# Patient Record
Sex: Female | Born: 1955 | Race: White | Hispanic: No | Marital: Single | Smoking: Never smoker
Health system: Southern US, Community
[De-identification: ages and names within clinical notes are randomized; demographics above are authoritative.]

## PROBLEM LIST (undated history)

## (undated) DIAGNOSIS — E785 Hyperlipidemia, unspecified: Secondary | ICD-10-CM

## (undated) DIAGNOSIS — F329 Major depressive disorder, single episode, unspecified: Secondary | ICD-10-CM

## (undated) DIAGNOSIS — I5189 Other ill-defined heart diseases: Secondary | ICD-10-CM

## (undated) DIAGNOSIS — E119 Type 2 diabetes mellitus without complications: Secondary | ICD-10-CM

## (undated) DIAGNOSIS — J189 Pneumonia, unspecified organism: Secondary | ICD-10-CM

## (undated) DIAGNOSIS — M1711 Unilateral primary osteoarthritis, right knee: Secondary | ICD-10-CM

## (undated) DIAGNOSIS — I10 Essential (primary) hypertension: Secondary | ICD-10-CM

## (undated) DIAGNOSIS — J309 Allergic rhinitis, unspecified: Secondary | ICD-10-CM

## (undated) DIAGNOSIS — F411 Generalized anxiety disorder: Secondary | ICD-10-CM

## (undated) DIAGNOSIS — L719 Rosacea, unspecified: Secondary | ICD-10-CM

## (undated) DIAGNOSIS — I4891 Unspecified atrial fibrillation: Secondary | ICD-10-CM

## (undated) HISTORY — DX: Rosacea, unspecified: L71.9

## (undated) HISTORY — DX: Allergic rhinitis, unspecified: J30.9

## (undated) HISTORY — DX: Unilateral primary osteoarthritis, right knee: M17.11

## (undated) HISTORY — DX: Major depressive disorder, single episode, unspecified: F32.9

## (undated) HISTORY — DX: Type 2 diabetes mellitus without complications: E11.9

## (undated) HISTORY — DX: Other ill-defined heart diseases: I51.89

## (undated) HISTORY — PX: OTHER SURGICAL HISTORY: SHX169

## (undated) HISTORY — DX: Unspecified atrial fibrillation: I48.91

## (undated) HISTORY — DX: Generalized anxiety disorder: F41.1

## (undated) HISTORY — DX: Pneumonia, unspecified organism: J18.9

## (undated) HISTORY — DX: Hyperlipidemia, unspecified: E78.5

## (undated) HISTORY — DX: Essential (primary) hypertension: I10

---

## 1997-10-18 ENCOUNTER — Other Ambulatory Visit: Admission: RE | Admit: 1997-10-18 | Discharge: 1997-10-18 | Payer: Self-pay | Admitting: Obstetrics & Gynecology

## 1998-10-23 ENCOUNTER — Other Ambulatory Visit: Admission: RE | Admit: 1998-10-23 | Discharge: 1998-10-23 | Payer: Self-pay | Admitting: Obstetrics & Gynecology

## 1999-11-08 ENCOUNTER — Other Ambulatory Visit: Admission: RE | Admit: 1999-11-08 | Discharge: 1999-11-08 | Payer: Self-pay | Admitting: Obstetrics & Gynecology

## 2001-05-12 ENCOUNTER — Other Ambulatory Visit: Admission: RE | Admit: 2001-05-12 | Discharge: 2001-05-12 | Payer: Self-pay | Admitting: Obstetrics & Gynecology

## 2005-03-04 ENCOUNTER — Other Ambulatory Visit: Admission: RE | Admit: 2005-03-04 | Discharge: 2005-03-04 | Payer: Self-pay | Admitting: Obstetrics & Gynecology

## 2007-10-29 ENCOUNTER — Encounter: Admission: RE | Admit: 2007-10-29 | Discharge: 2007-10-29 | Payer: Self-pay | Admitting: Obstetrics & Gynecology

## 2007-11-02 ENCOUNTER — Telehealth (INDEPENDENT_AMBULATORY_CARE_PROVIDER_SITE_OTHER): Payer: Self-pay | Admitting: *Deleted

## 2007-12-31 ENCOUNTER — Ambulatory Visit: Payer: Self-pay | Admitting: Internal Medicine

## 2007-12-31 DIAGNOSIS — F329 Major depressive disorder, single episode, unspecified: Secondary | ICD-10-CM

## 2007-12-31 DIAGNOSIS — F3289 Other specified depressive episodes: Secondary | ICD-10-CM

## 2007-12-31 DIAGNOSIS — E119 Type 2 diabetes mellitus without complications: Secondary | ICD-10-CM

## 2007-12-31 DIAGNOSIS — I10 Essential (primary) hypertension: Secondary | ICD-10-CM

## 2007-12-31 DIAGNOSIS — J309 Allergic rhinitis, unspecified: Secondary | ICD-10-CM | POA: Insufficient documentation

## 2007-12-31 DIAGNOSIS — E1159 Type 2 diabetes mellitus with other circulatory complications: Secondary | ICD-10-CM | POA: Insufficient documentation

## 2007-12-31 DIAGNOSIS — F411 Generalized anxiety disorder: Secondary | ICD-10-CM

## 2007-12-31 DIAGNOSIS — F32A Depression, unspecified: Secondary | ICD-10-CM | POA: Insufficient documentation

## 2007-12-31 HISTORY — DX: Essential (primary) hypertension: I10

## 2007-12-31 HISTORY — DX: Allergic rhinitis, unspecified: J30.9

## 2007-12-31 HISTORY — DX: Generalized anxiety disorder: F41.1

## 2007-12-31 HISTORY — DX: Other specified depressive episodes: F32.89

## 2007-12-31 HISTORY — DX: Major depressive disorder, single episode, unspecified: F32.9

## 2007-12-31 HISTORY — DX: Type 2 diabetes mellitus without complications: E11.9

## 2008-02-04 ENCOUNTER — Telehealth: Payer: Self-pay | Admitting: Internal Medicine

## 2008-02-04 ENCOUNTER — Ambulatory Visit: Payer: Self-pay | Admitting: Internal Medicine

## 2008-02-05 LAB — CONVERTED CEMR LAB
ALT: 19 units/L (ref 0–35)
BUN: 7 mg/dL (ref 6–23)
Basophils Relative: 0.2 % (ref 0.0–3.0)
Bilirubin, Direct: 0.1 mg/dL (ref 0.0–0.3)
Chloride: 103 meq/L (ref 96–112)
Creatinine,U: 114.4 mg/dL
Eosinophils Relative: 1.1 % (ref 0.0–5.0)
Glucose, Bld: 307 mg/dL — ABNORMAL HIGH (ref 70–99)
HCT: 42.2 % (ref 36.0–46.0)
HDL: 41.2 mg/dL (ref >39.0)
Hgb A1c MFr Bld: 10.9 % — ABNORMAL HIGH (ref 4.6–6.0)
Microalb Creat Ratio: 39.3 mg/g — ABNORMAL HIGH (ref 0.0–30.0)
Microalb, Ur: 4.5 mg/dL — ABNORMAL HIGH (ref 0.0–1.9)
Monocytes Absolute: 0.6 10*3/uL (ref 0.1–1.0)
Monocytes Relative: 6 % (ref 3.0–12.0)
Neutrophils Relative %: 75 % (ref 43.0–77.0)
Platelets: 247 10*3/uL (ref 150–400)
Potassium: 3.7 meq/L (ref 3.5–5.1)
RBC: 5.11 M/uL (ref 3.87–5.11)
TSH: 2.21 microintl units/mL (ref 0.35–5.50)
Total Bilirubin: 0.6 mg/dL (ref 0.3–1.2)
Total CHOL/HDL Ratio: 4.7
WBC: 9.9 10*3/uL (ref 4.5–10.5)

## 2008-02-11 ENCOUNTER — Ambulatory Visit: Payer: Self-pay | Admitting: Internal Medicine

## 2008-02-11 DIAGNOSIS — H60339 Swimmer's ear, unspecified ear: Secondary | ICD-10-CM

## 2008-02-22 ENCOUNTER — Encounter: Payer: Self-pay | Admitting: Internal Medicine

## 2010-07-01 ENCOUNTER — Encounter: Payer: Self-pay | Admitting: Otolaryngology

## 2010-09-30 ENCOUNTER — Inpatient Hospital Stay (HOSPITAL_COMMUNITY)
Admission: AD | Admit: 2010-09-30 | Discharge: 2010-10-03 | DRG: 544 | Disposition: A | Discharge status: Home Health | Payer: BC Managed Care – PPO | Source: Other Acute Inpatient Hospital | Attending: Internal Medicine | Admitting: Internal Medicine

## 2010-09-30 ENCOUNTER — Emergency Department (HOSPITAL_COMMUNITY): Payer: BC Managed Care – PPO

## 2010-09-30 ENCOUNTER — Emergency Department (HOSPITAL_COMMUNITY)
Admission: EM | Admit: 2010-09-30 | Discharge: 2010-09-30 | Disposition: A | Discharge status: Skilled Nursing Facility | Payer: BC Managed Care – PPO | Source: Home / Self Care | Attending: Emergency Medicine | Admitting: Emergency Medicine

## 2010-09-30 DIAGNOSIS — G43909 Migraine, unspecified, not intractable, without status migrainosus: Secondary | ICD-10-CM | POA: Diagnosis present

## 2010-09-30 DIAGNOSIS — M1710 Unilateral primary osteoarthritis, unspecified knee: Secondary | ICD-10-CM | POA: Diagnosis present

## 2010-09-30 DIAGNOSIS — R079 Chest pain, unspecified: Secondary | ICD-10-CM

## 2010-09-30 DIAGNOSIS — IMO0002 Reserved for concepts with insufficient information to code with codable children: Secondary | ICD-10-CM | POA: Diagnosis present

## 2010-09-30 DIAGNOSIS — Z8249 Family history of ischemic heart disease and other diseases of the circulatory system: Secondary | ICD-10-CM

## 2010-09-30 DIAGNOSIS — Z7901 Long term (current) use of anticoagulants: Secondary | ICD-10-CM

## 2010-09-30 DIAGNOSIS — I5031 Acute diastolic (congestive) heart failure: Secondary | ICD-10-CM | POA: Diagnosis present

## 2010-09-30 DIAGNOSIS — I10 Essential (primary) hypertension: Secondary | ICD-10-CM | POA: Diagnosis present

## 2010-09-30 DIAGNOSIS — IMO0001 Reserved for inherently not codable concepts without codable children: Secondary | ICD-10-CM | POA: Diagnosis present

## 2010-09-30 DIAGNOSIS — I509 Heart failure, unspecified: Secondary | ICD-10-CM | POA: Diagnosis present

## 2010-09-30 DIAGNOSIS — I4891 Unspecified atrial fibrillation: Principal | ICD-10-CM | POA: Diagnosis present

## 2010-09-30 DIAGNOSIS — D508 Other iron deficiency anemias: Secondary | ICD-10-CM | POA: Diagnosis present

## 2010-09-30 DIAGNOSIS — E876 Hypokalemia: Secondary | ICD-10-CM | POA: Diagnosis not present

## 2010-09-30 LAB — POCT CARDIAC MARKERS
CKMB, poc: 1 ng/mL (ref 1.0–8.0)
Troponin i, poc: <0.05 ng/mL (ref 0.00–0.09)

## 2010-09-30 LAB — CBC
MCV: 81.4 fL (ref 78.0–100.0)
Platelets: 199 10*3/uL (ref 150–400)
RBC: 5 MIL/uL (ref 3.87–5.11)
RDW: 13.9 % (ref 11.5–15.5)
WBC: 9.8 10*3/uL (ref 4.0–10.5)

## 2010-09-30 LAB — CARDIAC PANEL(CRET KIN+CKTOT+MB+TROPI)
Relative Index: INVALID (ref 0.0–2.5)
Total CK: 61 U/L (ref 7–177)

## 2010-09-30 LAB — BASIC METABOLIC PANEL
BUN: 10 mg/dL (ref 6–23)
Chloride: 103 mEq/L (ref 96–112)
Creatinine, Ser: 0.77 mg/dL (ref 0.4–1.2)
GFR calc non Af Amer: >60 mL/min (ref >60)
Glucose, Bld: 322 mg/dL — ABNORMAL HIGH (ref 70–99)
Potassium: 3.6 mEq/L (ref 3.5–5.1)

## 2010-09-30 LAB — DIFFERENTIAL
Basophils Relative: 0 % (ref 0–1)
Eosinophils Absolute: 0.1 10*3/uL (ref 0.0–0.7)
Eosinophils Relative: 1 % (ref 0–5)
Neutrophils Relative %: 80 % — ABNORMAL HIGH (ref 43–77)

## 2010-09-30 LAB — GLUCOSE, CAPILLARY
Glucose-Capillary: 253 mg/dL — ABNORMAL HIGH (ref 70–99)
Glucose-Capillary: 231 mg/dL — ABNORMAL HIGH (ref 70–99)

## 2010-09-30 LAB — HEPATIC FUNCTION PANEL
ALT: 21 U/L (ref 0–35)
AST: 16 U/L (ref 0–37)
Albumin: 3.6 g/dL (ref 3.5–5.2)

## 2010-09-30 LAB — HEPARIN LEVEL (UNFRACTIONATED): Heparin Unfractionated: <0.1 IU/mL — ABNORMAL LOW (ref 0.30–0.70)

## 2010-09-30 LAB — MRSA PCR SCREENING: MRSA by PCR: NEGATIVE

## 2010-09-30 LAB — PROTIME-INR: Prothrombin Time: 14.1 seconds (ref 11.6–15.2)

## 2010-09-30 LAB — BRAIN NATRIURETIC PEPTIDE: Pro B Natriuretic peptide (BNP): 168 pg/mL — ABNORMAL HIGH (ref 0.0–100.0)

## 2010-10-01 DIAGNOSIS — I517 Cardiomegaly: Secondary | ICD-10-CM

## 2010-10-01 DIAGNOSIS — I509 Heart failure, unspecified: Secondary | ICD-10-CM

## 2010-10-01 LAB — LIPID PANEL
Cholesterol: 133 mg/dL (ref 0–200)
HDL: 46 mg/dL (ref >39)
LDL Cholesterol: 66 mg/dL (ref 0–99)
Triglycerides: 105 mg/dL (ref <150)

## 2010-10-01 LAB — CARDIAC PANEL(CRET KIN+CKTOT+MB+TROPI)
CK, MB: 1.5 ng/mL (ref 0.3–4.0)
Relative Index: INVALID (ref 0.0–2.5)
Total CK: 49 U/L (ref 7–177)
Troponin I: 0.01 ng/mL (ref 0.00–0.06)

## 2010-10-01 LAB — BASIC METABOLIC PANEL
BUN: 7 mg/dL (ref 6–23)
Chloride: 102 mEq/L (ref 96–112)
GFR calc Af Amer: >60 mL/min (ref >60)
GFR calc non Af Amer: >60 mL/min (ref >60)
Potassium: 3.8 mEq/L (ref 3.5–5.1)
Sodium: 137 mEq/L (ref 135–145)

## 2010-10-01 LAB — CBC
Hemoglobin: 12.1 g/dL (ref 12.0–15.0)
Platelets: 185 10*3/uL (ref 150–400)
RBC: 4.61 MIL/uL (ref 3.87–5.11)
WBC: 8.6 10*3/uL (ref 4.0–10.5)

## 2010-10-01 LAB — GLUCOSE, CAPILLARY
Glucose-Capillary: 258 mg/dL — ABNORMAL HIGH (ref 70–99)
Glucose-Capillary: 175 mg/dL — ABNORMAL HIGH (ref 70–99)
Glucose-Capillary: 151 mg/dL — ABNORMAL HIGH (ref 70–99)

## 2010-10-01 LAB — HEPARIN LEVEL (UNFRACTIONATED)
Heparin Unfractionated: 0.14 IU/mL — ABNORMAL LOW (ref 0.30–0.70)
Heparin Unfractionated: 0.22 IU/mL — ABNORMAL LOW (ref 0.30–0.70)

## 2010-10-01 LAB — POCT ACTIVATED CLOTTING TIME: Activated Clotting Time: 411 seconds

## 2010-10-02 DIAGNOSIS — I4891 Unspecified atrial fibrillation: Secondary | ICD-10-CM

## 2010-10-02 LAB — BASIC METABOLIC PANEL
CO2: 27 mEq/L (ref 19–32)
Calcium: 8.3 mg/dL — ABNORMAL LOW (ref 8.4–10.5)
Calcium: 8.8 mg/dL (ref 8.4–10.5)
Creatinine, Ser: 0.75 mg/dL (ref 0.4–1.2)
GFR calc Af Amer: >60 mL/min (ref >60)
GFR calc Af Amer: >60 mL/min (ref >60)
GFR calc non Af Amer: >60 mL/min (ref >60)
Sodium: 136 mEq/L (ref 135–145)

## 2010-10-02 LAB — GLUCOSE, CAPILLARY: Glucose-Capillary: 200 mg/dL — ABNORMAL HIGH (ref 70–99)

## 2010-10-02 LAB — PROTIME-INR
INR: 1.05 (ref 0.00–1.49)
Prothrombin Time: 13.9 seconds (ref 11.6–15.2)

## 2010-10-02 LAB — CBC
MCHC: 32.1 g/dL (ref 30.0–36.0)
Platelets: 209 10*3/uL (ref 150–400)
RDW: 14.2 % (ref 11.5–15.5)

## 2010-10-02 LAB — HEPARIN LEVEL (UNFRACTIONATED): Heparin Unfractionated: 0.3 IU/mL (ref 0.30–0.70)

## 2010-10-03 DIAGNOSIS — I4891 Unspecified atrial fibrillation: Secondary | ICD-10-CM

## 2010-10-03 LAB — CBC
HCT: 39.1 % (ref 36.0–46.0)
Hemoglobin: 12.3 g/dL (ref 12.0–15.0)
MCH: 26 pg (ref 26.0–34.0)
MCHC: 31.5 g/dL (ref 30.0–36.0)
MCV: 82.7 fL (ref 78.0–100.0)
Platelets: 193 10*3/uL (ref 150–400)
RBC: 4.73 MIL/uL (ref 3.87–5.11)
RDW: 14 % (ref 11.5–15.5)
WBC: 8.7 10*3/uL (ref 4.0–10.5)

## 2010-10-03 LAB — GLUCOSE, CAPILLARY: Glucose-Capillary: 198 mg/dL — ABNORMAL HIGH (ref 70–99)

## 2010-10-03 LAB — BASIC METABOLIC PANEL
BUN: 3 mg/dL — ABNORMAL LOW (ref 6–23)
CO2: 27 mEq/L (ref 19–32)
Chloride: 103 mEq/L (ref 96–112)
Creatinine, Ser: 0.7 mg/dL (ref 0.4–1.2)
Glucose, Bld: 199 mg/dL — ABNORMAL HIGH (ref 70–99)

## 2010-10-03 LAB — PROTIME-INR
INR: 1.14 (ref 0.00–1.49)
Prothrombin Time: 14.8 seconds (ref 11.6–15.2)

## 2010-10-03 LAB — HEPARIN LEVEL (UNFRACTIONATED): Heparin Unfractionated: 0.38 IU/mL (ref 0.30–0.70)

## 2010-10-06 ENCOUNTER — Encounter: Payer: Self-pay | Admitting: Internal Medicine

## 2010-10-06 DIAGNOSIS — Z0001 Encounter for general adult medical examination with abnormal findings: Secondary | ICD-10-CM | POA: Insufficient documentation

## 2010-10-07 ENCOUNTER — Ambulatory Visit (INDEPENDENT_AMBULATORY_CARE_PROVIDER_SITE_OTHER): Payer: Self-pay | Admitting: Internal Medicine

## 2010-10-07 ENCOUNTER — Encounter: Payer: Self-pay | Admitting: Internal Medicine

## 2010-10-07 ENCOUNTER — Ambulatory Visit (INDEPENDENT_AMBULATORY_CARE_PROVIDER_SITE_OTHER): Payer: BC Managed Care – PPO | Admitting: *Deleted

## 2010-10-07 DIAGNOSIS — R0989 Other specified symptoms and signs involving the circulatory and respiratory systems: Secondary | ICD-10-CM

## 2010-10-07 DIAGNOSIS — I4891 Unspecified atrial fibrillation: Secondary | ICD-10-CM

## 2010-10-07 LAB — POCT INR: INR: 1.7

## 2010-10-08 NOTE — Discharge Summary (Signed)
NAMEWALLIS, SPIZZIRRI NO.:  [id number]  MEDICAL RECORD NO.:  [id number]           PATIENT TYPE:  I  LOCATION:  2923                         FACILITY:  [facility]  PHYSICIAN:  Bevelyn Buckles. Quenton Recendez, MDDATE OF BIRTH:  [birth year]  DATE OF ADMISSION:  09/30/2010 DATE OF DISCHARGE:  10/03/2010                              DISCHARGE SUMMARY   PRIMARY CARDIOLOGIST:  Bevelyn Buckles. Idalys Konecny, MD  PRIMARY CARE PROVIDER:  Corwin Levins, MD  DISCHARGE DIAGNOSIS:  Atrial fibrillation with rapid ventricular response.  SECONDARY DIAGNOSES: 1. Poorly controlled diabetes mellitus. 2. Acute diastolic congestive heart failure. 3. Obesity. 4. Migraine headaches. 5. Osteoarthritis, bilateral knees. 6. History of D and C x2.  ALLERGIES:  No known drug allergies.  PROCEDURES: 1. A 2D echocardiogram, October 01, 2010, showing an EF of 50-55% with     normal wall motion and Doppler primarily is consistent with high     ventricular filling pressures.  Mildly dilated left atrium.     Trivial pericardial effusion. 2. Diagnostic cardiac catheterization, April 24,012, showing normal     coronary arteries with normal LV function.  EF 55% and an LVEDP of     22.  HISTORY OF PRESENT ILLNESS:  A 55 year old female with prior history of poorly controlled diabetes mellitus, presented to [facility] on September 30, 2010, with a 1-week history of exertional dyspnea, occasional chest heaviness.  At [facility], the patient was noted to be in atrial fibrillation with rates in the 130s, and Cardiology was called for admission.  HOSPITAL COURSE:  The patient was admitted to [facility] and placed on rate control for atrial fibrillation as well as heparin anticoagulation. Chest x-ray showed congestive heart failure and the patient was placed on IV diuretics.  A 2D echocardiogram was undertaken on October 01, 2010, showing EF of 50-55% with Doppler parameters consistent with high filling pressures.   Diuresis continued.  The patient underwent diagnostic catheterization on October 01, 2010, showing normal LV function and normal coronary arteries with an LVEDP of 22 mmHg.  With additional diuresis, the patient's weight was reduced from 151.5 kg on admission to 145.2 at the time of discharge.  She has had a net negative of approximately 6.5 liters during this admission.  With the addition of diltiazem and metoprolol, the patient has achieved reasonable rate control with rates in the 80s to low 100s.  She has had symptomatic improvement.  At this time, she will be discharged home. Her INR is currently subtherapeutic.  She will follow up in our Coumadin Clinic early next week with plan for cardioversion following 4 weeks of therapeutic INR.  This patient has a history of poorly controlled diabetes mellitus with a hemoglobin A1c of 11.4, she had been seen by the Inpatient Diabetes Program here.  She has been placed on metformin as well as Amaryl as outlined below and has been counseled the importance of compliance. Further, referral has been made for outpatient diabetes education and prescription for home glucose monitoring has been provided.  The patient was advised to follow up with primary care in the next 1-2  weeks regarding her diabetes.  DISCHARGE LABS:  Hemoglobin 12.3, hematocrit 39.1, WBC 8.7, platelets 193, INR 1.14.  Sodium 140, potassium 3.8, chloride 103, CO2 27, BUN 3, creatinine 0.70, glucose 199.  Total bilirubin 0.5, alkaline phosphatase 54, AST 16, ALT 21, total protein 6.7, albumin 3.6, calcium 9.0. Hemoglobin A1c 11.4, CK 45, MB 1.2, troponin I 0.01.  BNP 102, cholesterol 133, triglycerides 105, HDL 46, LDL 66.  TSH 3.439.  ANA was negative.  MRSA screen was negative.  DISPOSITION:  The patient will be discharged home today in good condition.  FOLLOWUP PLANS AND APPOINTMENTS:  We will arrange follow up with [facility] Cou[facility] early next week and  subsequently with Dr. Gala Romney or Tereso Newcomer, PA, in approximately 4 weeks, at which point we can arrange for cardioversion.  She will follow up with Dr. [name] in the next 1-2 weeks.  DISCHARGE MEDICATIONS: 1. Amaryl 2 mg daily. 2. Diltiazem CD 360 mg daily. 3. Lopressor 25 mg b.i.d. 4. Metformin 500 mg b.i.d. 5. Spirolactone 25 mg daily. 6. Coumadin 5 mg at bedtime.  OUTSTANDING LAB STUDIES:  Followup INR early next week.  Followup lipids and LFTs in 6-8 weeks.  DURATION OF DISCHARGE ENCOUNTER:  45 minutes including physician time.     Nicolasa Ducking, ANP   ______________________________ Bevelyn Buckles. Doristine Shehan, MD    CB/MEDQ  D:  10/03/2010  T:  10/04/2010  Job:  [id number]  cc:   Corwin Levins, MD  Electronically Signed by Nicolasa Ducking ANP on 10/05/2010 04:04:23 PM Electronically Signed by Arvilla Meres MD on 10/08/2010 07:17:55 PM

## 2010-10-08 NOTE — Cardiovascular Report (Signed)
  NAMEMARGALIT, [name] NO.:  [id number]  MEDICAL RECORD NO.:  [id number]           PATIENT TYPE:  I  LOCATION:  2923                         FACILITY:  [facility]  PHYSICIAN:  Bevelyn Buckles. Jaja Switalski, MDDATE OF BIRTH:  1956-03-03  DATE OF PROCEDURE:  10/01/2010 DATE OF DISCHARGE:                           CARDIAC CATHETERIZATION   PRIMARY CARE PHYSICIAN:  Corwin Levins, MD  PATIENT IDENTIFICATION:  Pamela [name] is a 55 year old woman with history of diabetes, obesity, hypertension, and severe family history for coronary artery disease.  She was admitted with new-onset atrial fibrillation and heart failure.  She is brought to Cardiac catheterization to assess her coronary anatomy.  PROCEDURES PERFORMED: 1. Selective coronary angiography. 2. Left heart catheterization. 3. Left ventriculogram.  DESCRIPTION OF PROCEDURE:  The risks and indications were explained. Consent was signed and placed on the chart.  After confirmation of normal Allen test, the right wrist area was prepped and draped in a routine sterile fashion and anesthetized with 1% local lidocaine.  A 5- French radial sheath was placed in the radial artery using modified Seldinger technique.  Intraarterial verapamil 3 mg was given and 5500 units of systemic heparin were also administered.  Standard catheters including a JL-3.5, a JR-4, and angled pigtail were used.  There were no apparent complications.  Central aortic pressure 126/85 with a mean of 105.  LV pressure 131/11 with an EDP of 22.  There was no aortic stenosis.  Left main was normal.  LAD was a long vessel wrapping the apex.  It gave off a diagonal branch. It was angiographically normal.  Left circumflex gave off a large ramus branch and a large OM-1 and was angiographically normal.  Right coronary artery was a large dominant vessel, gave off a RV branch, a large acute marginal, a PDA, and posterolateral, and was angiographically  normal.  Left ventricle showed an EF of 55% with no regional wall motion abnormalities.  ASSESSMENT: 1. Normal coronary arteries. 2. Normal left ventricular function with mild to moderately increased     left ventricular end-diastolic pressure.  PLAN:  Will be for medical therapy.     Bevelyn Buckles. Vassie Kugel, MD     DRB/MEDQ  D:  10/01/2010  T:  10/02/2010  Job:  [id number]  Electronically Signed by Arvilla Meres MD on 10/08/2010 07:17:51 PM

## 2010-10-08 NOTE — H&P (Addendum)
[name], [name] NO.:  [id number]  MEDICAL RECORD NO.:  [id number]           [name] TYPE:  E  LOCATION:  WLED                         [facility]ACILITY[facility]  [facility]  PHYSICIAN:  Pamela [name], MDDATE OF BIRTH:  February 18, 1956  DATE OF ADMISSION:  09/30/2010 DATE OF DISCHARGE:                             HISTORY & PHYSICAL   PRIMARY CARDIOLOGIST:  New.  PRIMARY MEDICAL DOCTOR:  Pamela Levins, MD  CHIEF COMPLAINT:  Shortness of breath.  HISTORY OF PRESENT ILLNESS:  Pamela [name] is a 55 year old female with no prior cardiac history, but a history of untreated diabetes mellitus, migraine, and a strong family history for CAD/CHF who presents to [facility] with complaints of shortness of breath for approximately a week.  Over Pamela weekend, she did not feel well, became so short of breath that even showering and moving from one room to another caused her to have to sit down and rest, which was unusual.  Initially she thought it was related to allergies, but Pamela increase in labored breathing worried her.  She also describes orthopnea and a sensation of abdominal distention.  She has had chest pain that occasionally feels like heaviness without any aggravating factors that comes and goes and lasts approximately 10 to 15 minutes at a time, not particularly associated with exertion.  She also relates a sensation of fast heart rate, but not necessarily irregular.  On arrival to [name], was found to have probable CHF on chest x-ray as well as AFib with RVR with rates in Pamela 130s.  She has since slowed down to Pamela low 100s on Pamela Cardizem drip and now only endorses feeling tired with a headache. Blood work is pertinent for elevated BNP of 168, glucose of 322, and otherwise negative including cardiac enzymes.  PAST MEDICAL HISTORY: 1. Migraines. 2. Type 2 diabetes.  Last A1c I see is from 2009 when it was 10.9.     She has been more preoccupied caring for her  mother, and has     admitted to neglecting her own health. 3. Osteoarthritis of both knees. 4. Possible hypertension.  No prior cardiac history or workup including cath, stress, MI, or CVA.  PAST SURGICAL HISTORY:  Two D and Cs, Pamela first of which was done for precancerous cells.  MEDICATIONS: 1. Aleve 220 mg 2 tabs q.8 h 2. Excedrin migraine p.r.n.  ALLERGIES:  No known drug allergies.  SOCIAL HISTORY:  Pamela [name] is single.  She has no children.  She lives with her mother.  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  She denies any history of tobacco abuse.  She drinks rare social alcohol, 3 to 4 times a year.  She denies any illicit drug use.  FAMILY HISTORY:  Mother is living, but is in hospice care with CHF at age 10 and also had bypass.  Father had MI at 15 and bypass and is also status post ICD implantation.  She has 3 brothers who are living, one who had stent approximately 10 years ago.  She is Pamela oldest.  She has 1 sister who died of ischemic heart disease at age 44.  REVIEW OF SYSTEMS:  No fevers, chills, or syncope.  She gets occasional palpitations.  No nausea or vomiting.  Her abdomen had felt distended. She had left ankle edema on Friday.  All other systems reviewed and otherwise negative except for those noted in Pamela HPI.  LABORATORY DATA:  WBC 9.8, hemoglobin 13.5, hematocrit 40.7, platelet count 199.  Sodium 137, potassium 3.6, chloride 103, CO2 of 24, glucose 322, BUN 10, creatinine 0.77.  Cardiac enzymes negative x1.  RADIOLOGY: 1. Chest x-ray showed probable CHF. 2. EKG, atrial fibrillation with RVR with rate of 130 beats per     minute, cannot rule out anterior infarct, possible Qs in aVR, V1     through V3.  PHYSICAL EXAMINATION:  VITAL SIGNS:  Temperature 97.4, pulse 120, respirations 23, blood pressure 144/99, pulse ox 99% on room air. GENERAL:  This is a pleasant obese white female, in no acute  distress. HEENT:  Normocephalic, atraumatic with extraocular movements intact. Clear sclerae.  Nares without discharge.  She has a malar rash to her cheeks and T-zone. NECK:  Supple without carotid bruit. HEART:  Auscultation of Pamela heart reveals a regular tachycardic rate and rhythm with S1, S2 without obvious murmurs, rubs, or gallops.  She has bilateral rales one-half of Pamela way up that are soft. ABDOMEN:  Soft, nontender, nondistended with positive bowel sounds. EXTREMITIES:  Warm, dry, and without edema.  She has 1+ pedal pulses bilaterally and varicosities over both extremities. NEUROLOGICALLY:  She is alert and oriented x3, responds to questions appropriately with a normal affect.  ASSESSMENT/PLAN:  Pamela [name] was seen and examined by Dr. Gala Romney and myself.  This is a pleasant 55 year old lady with a history of untreated diabetes, probable hypertension, strong family history of coronary artery disease  and congestive heart failure who presents to [facility] with atrial fibrillation with rapid ventricular response, complaints of dyspnea, and probable congestive heart failure, which are all new in onset. Her atrial fibrillation is of unknown duration.  She is currently more stable on diltiazem drip.  At this time, we will transfer Pamela [name] to [name] for rate control with continued IV Cardizem.  She will be initiated on heparin with likely eventual conversion to Coumadin in Pamela setting of an elevated CHADS-VASc score.  Dr. Gala Romney would also like to initiate metoprolol 25 mg p.o. q.8 h.  Baby aspirin will also be started.  She will be initially diuresed with 40 mg IV b.i.d. of Lasix though she is Lasix naive.  We will check a 2D echocardiogram to rule out tachycardia- induced cardiomyopathy.  Given her cardiac risk factors, we would ideally plan for cath in 24 to 48 hours.  An ANA will also be checked given her interesting malar rash.   She may possibly require TEE cardioversion prior to discharge.  We will hold off on initiating Coumadin until after her catheterization.  Likewise given her diabetes, we will hold off on ACE inhibitor in anticipation of cardiac catheterization as well.     Janel Beane, P.A.C.   ______________________________ Pamela Buckles. Bensimhon, MD    DD/MEDQ  D:  09/30/2010  T:  09/30/2010  Job:  [id number]  cc:   Pamela Buckles. Bensimhon, MD [address] [place] [address]  Pamela Levins, MD [address] [place] [address]  Electronically Signed by Arvilla Meres MD on 10/08/2010 07:17:47 PM  Electronically Signed by Ronie Spies  on 10/16/2010 04:21:09 PM

## 2010-10-10 ENCOUNTER — Telehealth: Payer: Self-pay | Admitting: Internal Medicine

## 2010-10-10 NOTE — Telephone Encounter (Signed)
Kelly from [facility] calling to notify dr bensimhon that Pamela [name] has gone back to work and has been d/c from [facility]--nt

## 2010-10-10 NOTE — Telephone Encounter (Signed)
Pamela [name] states home health services have been discharge. Pt return to work.

## 2010-10-11 ENCOUNTER — Encounter: Payer: Self-pay | Admitting: Internal Medicine

## 2010-10-11 ENCOUNTER — Ambulatory Visit (INDEPENDENT_AMBULATORY_CARE_PROVIDER_SITE_OTHER): Payer: BC Managed Care – PPO | Admitting: Internal Medicine

## 2010-10-11 VITALS — BP 110/80 | HR 78 | Temp 97.8°F | Ht 72.0 in | Wt 307.4 lb

## 2010-10-11 DIAGNOSIS — I5189 Other ill-defined heart diseases: Secondary | ICD-10-CM | POA: Insufficient documentation

## 2010-10-11 DIAGNOSIS — Z0000 Encounter for general adult medical examination without abnormal findings: Secondary | ICD-10-CM

## 2010-10-11 DIAGNOSIS — Z7901 Long term (current) use of anticoagulants: Secondary | ICD-10-CM

## 2010-10-11 DIAGNOSIS — I4891 Unspecified atrial fibrillation: Secondary | ICD-10-CM

## 2010-10-11 DIAGNOSIS — L719 Rosacea, unspecified: Secondary | ICD-10-CM

## 2010-10-11 DIAGNOSIS — I519 Heart disease, unspecified: Secondary | ICD-10-CM

## 2010-10-11 DIAGNOSIS — E119 Type 2 diabetes mellitus without complications: Secondary | ICD-10-CM

## 2010-10-11 DIAGNOSIS — I10 Essential (primary) hypertension: Secondary | ICD-10-CM

## 2010-10-11 HISTORY — DX: Other ill-defined heart diseases: I51.89

## 2010-10-11 HISTORY — DX: Rosacea, unspecified: L71.9

## 2010-10-11 NOTE — Assessment & Plan Note (Signed)
Good rate control today and volume stable on current meds, has INR sched for the coumadin for next mon - tent plan per pt is 4 wks coumadin, and then cardioversion

## 2010-10-11 NOTE — Assessment & Plan Note (Signed)
D/w pt - consider metrogel, but declines at this time

## 2010-10-11 NOTE — Patient Instructions (Signed)
Continue all other medications as before Please keep your appointments with your specialists as you have planned - the coumadin clinic, and cardiology Please return in 6 wks for LAB only Please call the number on the [facility] Card (the [employer] System) for results of testing in 2-3 days Please return in 6 mo with Lab testing done 3-5 days before

## 2010-10-11 NOTE — Assessment & Plan Note (Signed)
Marked improveement with cbg's in the 130-160 on current meds,  To cont to monitor as she does,  Check repeat a1c in 6 wks, f/u overall in 6 mo, or sooner if needed

## 2010-10-12 ENCOUNTER — Encounter: Payer: Self-pay | Admitting: Internal Medicine

## 2010-10-12 NOTE — Assessment & Plan Note (Signed)
stable overall by hx and exam, most recent lab reviewed with pt, and pt to continue medical treatment as before 

## 2010-10-12 NOTE — Progress Notes (Signed)
  Subjective:    Patient ID: Pamela [name], female    DOB: Jan 13, 1956, 55 y.o.   MRN: [id number]  HPI  Here to f/u after recent hospn with afib/rvr, severer DM out of control with noncompliacne with meds and lost to f/u;  Now back on OHA's, with CBG's since d/c in the 130 to 160 range, with the higher range in the am;  Pt denies chest pain, increased sob or doe, wheezing, orthopnea, PND, increased LE swelling, palpitations, dizziness or syncope.  Pt denies new neurological symptoms such as new headache, or facial or extremity weakness or numbness   Pt denies polydipsia, polyuria,  Pt states overall good compliance with meds, trying to follow lower cholesterol, diabetic diet since d/c, wt overall stable but little exercise however.   Pt denies fever, wt loss, night sweats, loss of appetite, or other constitutional symptoms  Overall good compliance with treatment, and good medicine tolerability.  No low sugars.  Denies worsening depressive symptoms, suicidal ideation, or panic.   Past Medical History  Diagnosis Date  . DIABETES MELLITUS, TYPE II 12/31/2007  . ANXIETY 12/31/2007  . DEPRESSION 12/31/2007  . HYPERTENSION 12/31/2007  . ALLERGIC RHINITIS 12/31/2007  . Right knee DJD   . Rosacea 10/11/2010  . Diastolic dysfunction 10/11/2010   No past surgical history on file.  reports that she has never smoked. She does not have any smokeless tobacco history on file. She reports that she drinks alcohol. Her drug history not on file. family history includes Arthritis in her other; Cancer in her other; Diabetes in her other; Heart disease in her other; Mental illness in her other; and Stroke in her other. No Known Allergies Current Outpatient Prescriptions on File Prior to Visit  Medication Sig Dispense Refill  . warfarin (COUMADIN) 5 MG tablet Take by mouth daily. Take as instructed by coumadin clinic.         Review of Systems All otherwise neg per pt     Objective:   Physical Exam BP 110/80  Pulse 78   Temp(Src) 97.8 F (36.6 C) (Oral)  Ht 6' (1.829 m)  Wt 307 lb 6 oz (139.424 kg)  BMI 41.69 kg/m2  SpO2 97%  LMP 09/08/2010 Physical Exam  VS noted Constitutional: Pt appears well-developed and well-nourished.  HENT: Head: Normocephalic.  Right Ear: External ear normal.  Left Ear: External ear normal.  Eyes: Conjunctivae and EOM are normal. Pupils are equal, round, and reactive to light.  Neck: Normal range of motion. Neck supple.  Cardiovascular: IRIR  Pulmonary/Chest: Effort normal and breath sounds normal.  Abd:  Soft, NT, non-distended, + BS Neurological: Pt is alert. No cranial nerve deficit.  Skin: Skin is warm. No erythema. except for rosacea to face Psychiatric: Pt behavior is normal. Thought content normal.  1+ nervous       Assessment & Plan:

## 2010-10-14 ENCOUNTER — Ambulatory Visit (INDEPENDENT_AMBULATORY_CARE_PROVIDER_SITE_OTHER): Payer: BC Managed Care – PPO | Admitting: *Deleted

## 2010-10-14 DIAGNOSIS — I4891 Unspecified atrial fibrillation: Secondary | ICD-10-CM

## 2010-10-21 ENCOUNTER — Ambulatory Visit (INDEPENDENT_AMBULATORY_CARE_PROVIDER_SITE_OTHER): Payer: BC Managed Care – PPO | Admitting: *Deleted

## 2010-10-21 DIAGNOSIS — I4891 Unspecified atrial fibrillation: Secondary | ICD-10-CM

## 2010-10-24 ENCOUNTER — Telehealth: Payer: Self-pay | Admitting: Internal Medicine

## 2010-10-24 MED ORDER — FUROSEMIDE 20 MG PO TABS: 20.0000 mg | ORAL_TABLET | Freq: Every day | ORAL | Status: DC

## 2010-10-24 MED ORDER — POTASSIUM CHLORIDE CRYS ER 20 MEQ PO TBCR: 20.0000 meq | EXTENDED_RELEASE_TABLET | Freq: Every day | ORAL | Status: DC

## 2010-10-24 NOTE — Telephone Encounter (Signed)
Pt calling re weight gain about 3 to 4 pounds the pass few days, pt would like to talk to a nurse.

## 2010-10-24 NOTE — Telephone Encounter (Signed)
Spoke w/pt she states her wt is up about 3 lbs since Wed, she went to [employer] last night and noticed she was more short of breathe than usual walking around, no edema, increased fatigue she is on Spiro 25 and is sch to see Dr Gala Romney on Mon 5/21 at 11am, discussed w/Dr Bensimhon will put pt on Lasix 20 mg and KCL 20 meq for 2 days and then prn, pt is aware rx sent in

## 2010-10-28 ENCOUNTER — Ambulatory Visit (INDEPENDENT_AMBULATORY_CARE_PROVIDER_SITE_OTHER): Payer: BC Managed Care – PPO | Admitting: Internal Medicine

## 2010-10-28 ENCOUNTER — Encounter: Payer: Self-pay | Admitting: Internal Medicine

## 2010-10-28 ENCOUNTER — Ambulatory Visit (INDEPENDENT_AMBULATORY_CARE_PROVIDER_SITE_OTHER): Payer: BC Managed Care – PPO | Admitting: *Deleted

## 2010-10-28 VITALS — BP 108/70 | HR 86 | Resp 20 | Ht 72.0 in | Wt 313.4 lb

## 2010-10-28 DIAGNOSIS — I5032 Chronic diastolic (congestive) heart failure: Secondary | ICD-10-CM

## 2010-10-28 DIAGNOSIS — I4891 Unspecified atrial fibrillation: Secondary | ICD-10-CM

## 2010-10-28 DIAGNOSIS — I5033 Acute on chronic diastolic (congestive) heart failure: Secondary | ICD-10-CM | POA: Insufficient documentation

## 2010-10-28 DIAGNOSIS — I509 Heart failure, unspecified: Secondary | ICD-10-CM

## 2010-10-28 LAB — POCT INR: INR: 1.5

## 2010-10-28 MED ORDER — RIVAROXABAN 20 MG PO TABS: 1.0000 | ORAL_TABLET | Freq: Every day | ORAL | Status: DC

## 2010-10-28 NOTE — Assessment & Plan Note (Signed)
Remains in AF but rate controlled. CHADs-VASC =4 so needs long-term anti-coagulation. Given trouble achieving therapeutic INRs will switch to Pradaxa or Xarelto depending on which is easier to afford. Plan DC-CV in 4 weeks. I think she is at high risk for OSA but she says she doesn't snore and feels strongly that she doesn't have it.

## 2010-10-28 NOTE — Progress Notes (Signed)
HPI:  Pamela [name] is a 55 y/o with h/o DM2, obesity, HTN. Admitted in April with acute diastolic HF and CP in setting of rapid atrial fib.  Underwent cardiac cath which showed normal coronaries. Did very well with rate control and diuresis. Started on coumadin. D/c'd home on spironolactone (no lasix)  Returns for f/u. Doing very well. Last week was having edema so started on lasix 20. No CP or palpitations. Exercise tolerance back to baseline. INRs remain low. Says she does not snore. Sister has OSA and she says she doesn't have it.     ROS: All systems negative except as listed in HPI, PMH and Problem List.  Past Medical History  Diagnosis Date  . DIABETES MELLITUS, TYPE II 12/31/2007  . ANXIETY 12/31/2007  . DEPRESSION 12/31/2007  . HYPERTENSION 12/31/2007  . ALLERGIC RHINITIS 12/31/2007  . Right knee DJD   . Rosacea 10/11/2010  . Diastolic dysfunction 10/11/2010  . Atrial fibrillation     Current Outpatient Prescriptions  Medication Sig Dispense Refill  . diltiazem (CARDIZEM CD) 360 MG 24 hr capsule Take 360 mg by mouth daily.        . furosemide (LASIX) 20 MG tablet Take 1 tablet (20 mg total) by mouth daily. As needed  30 tablet  3  . glimepiride (AMARYL) 2 MG tablet Take 2 mg by mouth daily.        . metFORMIN (GLUCOPHAGE) 500 MG tablet Take 500 mg by mouth 2 (two) times daily.        . metoprolol tartrate (LOPRESSOR) 25 MG tablet Take 25 mg by mouth 2 (two) times daily.        . potassium chloride SA (K-DUR,KLOR-CON) 20 MEQ tablet Take 1 tablet (20 mEq total) by mouth daily. As needed  30 tablet  3  . spironolactone (ALDACTONE) 25 MG tablet Take 25 mg by mouth daily.        Marland Kitchen warfarin (COUMADIN) 5 MG tablet Take by mouth daily. Take as instructed by coumadin clinic.          PHYSICAL EXAM: Filed Vitals:   10/28/10 1101  BP: 108/70  Pulse: 86  Resp: 20   General: Obese  Well appearing. No resp difficulty HEENT: normal Neck: supple. JVP flat. Carotids 2+ bilaterally; no bruits.  No lymphadenopathy or thryomegaly appreciated. Cor: PMI normal. Iregular rate & rhythm. No rubs, gallops or murmurs. Lungs: clear Abdomen: obese soft, nontender, nondistended.  Good bowel sounds. Extremities: no cyanosis, clubbing, rash, edema Neuro: alert & orientedx3, cranial nerves grossly intact. Moves all 4 extremities w/o difficulty. Affect pleasant.    ECG:  AF 85.  Non-specific STs    ASSESSMENT & PLAN:

## 2010-10-28 NOTE — Patient Instructions (Signed)
Stop Coumadin Start Xarelto 20 mg daily Your physician recommends that you schedule a follow-up appointment in: 1 month

## 2010-11-07 ENCOUNTER — Encounter: Payer: BC Managed Care – PPO | Admitting: *Deleted

## 2010-12-02 ENCOUNTER — Other Ambulatory Visit (INDEPENDENT_AMBULATORY_CARE_PROVIDER_SITE_OTHER): Payer: BC Managed Care – PPO

## 2010-12-02 ENCOUNTER — Encounter: Payer: Self-pay | Admitting: *Deleted

## 2010-12-02 ENCOUNTER — Encounter: Payer: Self-pay | Admitting: Internal Medicine

## 2010-12-02 ENCOUNTER — Ambulatory Visit (INDEPENDENT_AMBULATORY_CARE_PROVIDER_SITE_OTHER): Payer: BC Managed Care – PPO | Admitting: Internal Medicine

## 2010-12-02 VITALS — BP 124/76 | HR 64 | Resp 18 | Ht 72.0 in | Wt 314.0 lb

## 2010-12-02 DIAGNOSIS — E119 Type 2 diabetes mellitus without complications: Secondary | ICD-10-CM

## 2010-12-02 DIAGNOSIS — I509 Heart failure, unspecified: Secondary | ICD-10-CM

## 2010-12-02 DIAGNOSIS — I4891 Unspecified atrial fibrillation: Secondary | ICD-10-CM

## 2010-12-02 DIAGNOSIS — I5032 Chronic diastolic (congestive) heart failure: Secondary | ICD-10-CM

## 2010-12-02 LAB — BASIC METABOLIC PANEL
BUN: 17 mg/dL (ref 6–23)
CO2: 26 mEq/L (ref 19–32)
Chloride: 103 mEq/L (ref 96–112)
Creatinine, Ser: 1.1 mg/dL (ref 0.4–1.2)
Glucose, Bld: 102 mg/dL — ABNORMAL HIGH (ref 70–99)
Potassium: 3.8 mEq/L (ref 3.5–5.1)

## 2010-12-02 NOTE — Progress Notes (Signed)
HPI:  Pamela [name] is a 55 y/o with h/o DM2, obesity, HTN. Admitted in April 2012 with acute diastolic HF and CP in setting of rapid atrial fib.  Underwent cardiac cath which showed normal coronaries. Did very well with rate control and diuresis. Started on coumadin. D/c'd home on spironolactone (no lasix)  We saw her in May and switched her to Xarelto due to problems regulating INRs.   Returns for f/u. Doing OK. Over the weekend had some dyspnea. Occasional palpitations. Taking Xarelto regularly. No bleeding. Ran out of lasix and did not refill as she would stay up peeing all night. Weight up 1 pound since last visit but up 7 pounds since d/c.    ROS: All systems negative except as listed in HPI, PMH and Problem List.  Past Medical History  Diagnosis Date  . DIABETES MELLITUS, TYPE II 12/31/2007  . ANXIETY 12/31/2007  . DEPRESSION 12/31/2007  . HYPERTENSION 12/31/2007  . ALLERGIC RHINITIS 12/31/2007  . Right knee DJD   . Rosacea 10/11/2010  . Diastolic dysfunction 10/11/2010  . Atrial fibrillation     Current Outpatient Prescriptions  Medication Sig Dispense Refill  . diltiazem (CARDIZEM CD) 360 MG 24 hr capsule Take 360 mg by mouth daily.        . furosemide (LASIX) 20 MG tablet Take 1 tablet (20 mg total) by mouth daily. As needed  30 tablet  3  . glimepiride (AMARYL) 2 MG tablet Take 2 mg by mouth daily.        . metFORMIN (GLUCOPHAGE) 500 MG tablet Take 500 mg by mouth 2 (two) times daily.        . metoprolol tartrate (LOPRESSOR) 25 MG tablet Take 25 mg by mouth 2 (two) times daily.        . potassium chloride SA (K-DUR,KLOR-CON) 20 MEQ tablet Take 1 tablet (20 mEq total) by mouth daily. As needed  30 tablet  3  . Rivaroxaban (XARELTO) 20 MG TABS Take 1 tablet by mouth daily.  30 tablet  6  . spironolactone (ALDACTONE) 25 MG tablet Take 25 mg by mouth daily.           PHYSICAL EXAM: Filed Vitals:   12/02/10 1558  BP: 124/76  Pulse: 64  Resp: 18   General: Obese  Well appearing. No  resp difficulty HEENT: normal Neck: supple. JVP hard to see - appears flat. Carotids 2+ bilaterally; no bruits. No lymphadenopathy or thryomegaly appreciated. Cor: PMI normal. Iregular rate & rhythm. No rubs, gallops or murmurs. Lungs: clear Abdomen: obese soft, nontender, nondistended.  Good bowel sounds. Extremities: no cyanosis, clubbing, rash, tr edema Neuro: alert & orientedx3, cranial nerves grossly intact. Moves all 4 extremities w/o difficulty. Affect pleasant.  ECG:  AF 95.  Non-specific STs    ASSESSMENT & PLAN:

## 2010-12-02 NOTE — Patient Instructions (Signed)
Change Furosemide and Potassium to every other day I will call you when your procedure is scheduled  Your physician has recommended that you have a Cardioversion (DCCV). Electrical Cardioversion uses a jolt of electricity to your heart either through paddles or wired patches attached to your chest. This is a controlled, usually prescheduled, procedure. Defibrillation is done under light anesthesia in the hospital, and you usually go home the day of the procedure. This is done to get your heart back into a normal rhythm. You are not awake for the procedure. Please see the instruction sheet given to you today.  Your physician recommends that you schedule a follow-up appointment in: 3 months.

## 2010-12-03 ENCOUNTER — Other Ambulatory Visit: Payer: Self-pay

## 2010-12-03 MED ORDER — GLIMEPIRIDE 2 MG PO TABS: ORAL_TABLET | ORAL | Status: DC

## 2010-12-03 NOTE — Assessment & Plan Note (Signed)
Mild diastolic HF in setting of AF.She has problems taking lasix due to her work schedule. I suggested she take a dose right as she gets home after work and that way it will wear off before bedtime.

## 2010-12-03 NOTE — Assessment & Plan Note (Signed)
Remains in AF. Will plan DC-CV soon to restore NSR.

## 2010-12-05 ENCOUNTER — Telehealth: Payer: Self-pay | Admitting: Internal Medicine

## 2010-12-05 NOTE — Telephone Encounter (Signed)
DCCV sch for 7/6 unable to reach pt Left message to call back

## 2010-12-05 NOTE — Telephone Encounter (Signed)
Pt calling to see where we are with setting up her cardioversion

## 2010-12-06 NOTE — Telephone Encounter (Signed)
Returning call back to Pamela [name].

## 2010-12-06 NOTE — Telephone Encounter (Signed)
Pt aware to be at hospital at 11 am, instructions reviewed w/pt

## 2010-12-10 NOTE — Progress Notes (Signed)
Addended by: Judithe Modest D on: 12/10/2010 12:34 PM   Modules accepted: Orders

## 2010-12-13 ENCOUNTER — Ambulatory Visit (HOSPITAL_COMMUNITY)
Admission: RE | Admit: 2010-12-13 | Discharge: 2010-12-13 | Disposition: A | Discharge status: Home / Self Care | Payer: BC Managed Care – PPO | Source: Ambulatory Visit | Attending: Internal Medicine | Admitting: Internal Medicine

## 2010-12-13 DIAGNOSIS — Z79899 Other long term (current) drug therapy: Secondary | ICD-10-CM | POA: Insufficient documentation

## 2010-12-13 DIAGNOSIS — I4891 Unspecified atrial fibrillation: Secondary | ICD-10-CM

## 2010-12-13 DIAGNOSIS — Z01812 Encounter for preprocedural laboratory examination: Secondary | ICD-10-CM | POA: Insufficient documentation

## 2010-12-13 DIAGNOSIS — E119 Type 2 diabetes mellitus without complications: Secondary | ICD-10-CM | POA: Insufficient documentation

## 2010-12-13 DIAGNOSIS — M1710 Unilateral primary osteoarthritis, unspecified knee: Secondary | ICD-10-CM | POA: Insufficient documentation

## 2010-12-13 DIAGNOSIS — F411 Generalized anxiety disorder: Secondary | ICD-10-CM | POA: Insufficient documentation

## 2010-12-13 DIAGNOSIS — I10 Essential (primary) hypertension: Secondary | ICD-10-CM | POA: Insufficient documentation

## 2010-12-13 DIAGNOSIS — L719 Rosacea, unspecified: Secondary | ICD-10-CM | POA: Insufficient documentation

## 2010-12-13 DIAGNOSIS — F3289 Other specified depressive episodes: Secondary | ICD-10-CM | POA: Insufficient documentation

## 2010-12-13 DIAGNOSIS — Z7982 Long term (current) use of aspirin: Secondary | ICD-10-CM | POA: Insufficient documentation

## 2010-12-13 DIAGNOSIS — F329 Major depressive disorder, single episode, unspecified: Secondary | ICD-10-CM | POA: Insufficient documentation

## 2010-12-13 DIAGNOSIS — I5030 Unspecified diastolic (congestive) heart failure: Secondary | ICD-10-CM | POA: Insufficient documentation

## 2010-12-13 LAB — CBC
HCT: 36.9 % (ref 36.0–46.0)
Hemoglobin: 12.5 g/dL (ref 12.0–15.0)
MCH: 27 pg (ref 26.0–34.0)
MCHC: 33.9 g/dL (ref 30.0–36.0)
MCV: 79.7 fL (ref 78.0–100.0)
RDW: 15.6 % — ABNORMAL HIGH (ref 11.5–15.5)

## 2010-12-13 LAB — BASIC METABOLIC PANEL
BUN: 13 mg/dL (ref 6–23)
CO2: 23 mEq/L (ref 19–32)
Chloride: 103 mEq/L (ref 96–112)
Creatinine, Ser: 0.77 mg/dL (ref 0.50–1.10)
GFR calc Af Amer: >60 mL/min (ref >60)
Potassium: 4.3 mEq/L (ref 3.5–5.1)

## 2010-12-24 ENCOUNTER — Encounter: Payer: Self-pay | Admitting: Internal Medicine

## 2011-01-13 ENCOUNTER — Telehealth: Payer: Self-pay

## 2011-01-13 NOTE — Telephone Encounter (Signed)
[facility] Triage Call Report Triage Record Num: [id number] Operator: Migdalia Dk Patient Name: Pamela [name] Call Date & Time: 01/12/2011 11:39:45PM Patient Phone: [phone] PCP: Oliver Barre Patient Gender: Female PCP Fax : [phone] Patient DOB: June 05, 1956 Practice Name: [facility] Reason for Call: Pt. states cut tip of right thumb on can lid, "it kind of lookslike a papercut, but it was bleeding and since I take the Xarelto and its a blood thinner I thought maybe I should call." Pt. states cut has stopped bleeding at this time. Homecare advice given per Abrasions, Lacerations and Punctures Guideline. Protocol(s) Used: Abrasions, Lacerations, Puncture Wounds Recommended Outcome per Protocol: See Provider within 72 Hours Reason for Outcome: Prolonged bleeding from minor cuts, nicks, etc. Care Advice: Wash around wound gently with mild soap and water, then wash wound itself with just water or saline, if it is available. Pat dry. ~ Cover wound with a clean cloth and apply firm pressure to stop the bleeding. Hold steady pressure for 10 minutes or until bleeding stops. Apply a clean dressing. ~ 01/12/2011 11:47:19PM Page 1 of 1 [id number]

## 2011-01-15 ENCOUNTER — Telehealth: Payer: Self-pay | Admitting: Physician Assistant

## 2011-01-15 NOTE — Telephone Encounter (Signed)
FMLA & Initial Disability Claim Form dropped off to me by Herbert Seta S sent to [facility] 01/15/11/KM

## 2011-01-27 ENCOUNTER — Ambulatory Visit (INDEPENDENT_AMBULATORY_CARE_PROVIDER_SITE_OTHER): Payer: BC Managed Care – PPO | Admitting: Internal Medicine

## 2011-01-27 ENCOUNTER — Encounter: Payer: Self-pay | Admitting: Internal Medicine

## 2011-01-27 VITALS — BP 128/82 | HR 72 | Ht 72.0 in | Wt 322.0 lb

## 2011-01-27 DIAGNOSIS — I5032 Chronic diastolic (congestive) heart failure: Secondary | ICD-10-CM

## 2011-01-27 DIAGNOSIS — I4891 Unspecified atrial fibrillation: Secondary | ICD-10-CM

## 2011-01-27 DIAGNOSIS — I509 Heart failure, unspecified: Secondary | ICD-10-CM

## 2011-01-27 NOTE — Assessment & Plan Note (Signed)
Tolerating AF well. Will continue with rate control. Continue Xarelto.

## 2011-01-27 NOTE — Assessment & Plan Note (Signed)
Doing well. Mild volume overload in setting of skipping her lasix. Will have her restart lasix. Also encouraged exercise.

## 2011-01-27 NOTE — Progress Notes (Signed)
HPI:  Pamela [name] is a 55 y/o with h/o DM2, obesity, HTN. Admitted in April 2012 with acute diastolic HF and CP in setting of rapid atrial fib.  Underwent cardiac cath in April 2012 which showed normal coronaries. Did very well with rate control and diuresis. Started on coumadin. We saw her in May and switched her to Xarelto due to problems regulating INRs. She underwent DC-CV in June which failed so we opted for rate control strategy.  Returns for f/u. Very fatigued as she is caring for her mother who is in hospice care due to CHF and non-alcoholic cirrhosis.   Doing OK. Overall feels can do what she wants to do without too much problem. Occasional dyspnea. Not taking lasix much - says she forgot to take it because it is every other day. Weight up 8 pounds. Taking Xarelto regularly. Not exercising regularly. BP well controlled  ROS: All systems negative except as listed in HPI, PMH and Problem List.  Past Medical History  Diagnosis Date  . DIABETES MELLITUS, TYPE II 12/31/2007  . ANXIETY 12/31/2007  . DEPRESSION 12/31/2007  . HYPERTENSION 12/31/2007  . ALLERGIC RHINITIS 12/31/2007  . Right knee DJD   . Rosacea 10/11/2010  . Diastolic dysfunction 10/11/2010  . Atrial fibrillation   . Migraine   . PNA (pneumonia)     in her 31s    Current Outpatient Prescriptions  Medication Sig Dispense Refill  . diltiazem (CARDIZEM CD) 360 MG 24 hr capsule Take 360 mg by mouth daily.        . furosemide (LASIX) 20 MG tablet Take 20 mg by mouth every other day.        Marland Kitchen glimepiride (AMARYL) 2 MG tablet Take 1 by mouth with breakfast and 1 by mouth with dinner  180 tablet  3  . metFORMIN (GLUCOPHAGE) 500 MG tablet Take 500 mg by mouth 2 (two) times daily.        . metoprolol tartrate (LOPRESSOR) 25 MG tablet Take 25 mg by mouth 2 (two) times daily.        . potassium chloride SA (K-DUR,KLOR-CON) 20 MEQ tablet Take 20 mEq by mouth every other day.        . Rivaroxaban (XARELTO) 20 MG TABS Take 1 tablet by mouth  daily.  30 tablet  6  . spironolactone (ALDACTONE) 25 MG tablet Take 25 mg by mouth daily.        Marland Kitchen DISCONTD: furosemide (LASIX) 20 MG tablet Take 1 tablet (20 mg total) by mouth daily. As needed  30 tablet  3  . DISCONTD: potassium chloride SA (K-DUR,KLOR-CON) 20 MEQ tablet Take 1 tablet (20 mEq total) by mouth daily. As needed  30 tablet  3     PHYSICAL EXAM: Filed Vitals:   01/27/11 1648  BP: 128/82  Pulse: 72   General: Obese  Well appearing. No resp difficulty HEENT: normal Neck: supple. JVP hard to see - appears flat. Carotids 2+ bilaterally; no bruits. No lymphadenopathy or thryomegaly appreciated. Cor: PMI normal. Iregular rate & rhythm. No rubs, gallops or murmurs. Lungs: clear Abdomen: obese soft, nontender, nondistended.  Good bowel sounds. Extremities: no cyanosis, clubbing, rash, 1+ edema Neuro: alert & orientedx3, cranial nerves grossly intact. Moves all 4 extremities w/o difficulty. Affect pleasant.  ECG:  AF 72.  Non-specific STs    ASSESSMENT & PLAN:

## 2011-02-01 ENCOUNTER — Other Ambulatory Visit: Payer: Self-pay | Admitting: Nurse Practitioner

## 2011-02-04 NOTE — Cardiovascular Report (Signed)
  NAMETESNEEM, DUFRANE NO.:  [id number]  MEDICAL RECORD NO.:  [id number]  LOCATION:  [facility]                         FACILITY:  [facility]  PHYSICIAN:  Bevelyn Buckles. Navie Lamoreaux, MDDATE OF BIRTH:  07/02/1955  DATE OF PROCEDURE:  12/13/2010 DATE OF DISCHARGE:  12/13/2010                           CARDIAC CATHETERIZATION   PATIENT IDENTIFICATION:  Pamela [name] is a very pleasant 55 year old woman with a history of diabetes, hypertension, and morbid obesity.  She was recently admitted with acute diastolic heart failure in the setting of a new onset rapid atrial fibrillation.  She has been treated with Xarelto. She is scheduled today for cardioversion due to symptomatic atrial fibrillation.  The risks and indication were explained.  Consent was signed and placed in the chart.  She received 120 mg of propofol by Dr. Ivin Booty in Anesthesia.  After appropriate sedation, she underwent synchronized 200- joule biphasic shock with no significant effect.  I then put manual pressure down on her anterior pad and repeated attempts at cardioversion.  Once again this was unsuccessful.  CONCLUSION:  Unsuccessful attempt at DC cardioversion.  I will discuss the results with her.  We will probably proceed with rate control; however, if she remains symptomatic, we may need to consider antiarrhythmic therapy.     Bevelyn Buckles. Leanna Hamid, MD     DRB/MEDQ  D:  12/13/2010  T:  12/14/2010  Job:  [id number]  Electronically Signed by Arvilla Meres MD on 02/04/2011 05:12:32 PM

## 2011-04-14 ENCOUNTER — Ambulatory Visit: Payer: BC Managed Care – PPO | Admitting: Internal Medicine

## 2011-04-15 ENCOUNTER — Encounter: Payer: Self-pay | Admitting: Internal Medicine

## 2011-04-15 ENCOUNTER — Ambulatory Visit (INDEPENDENT_AMBULATORY_CARE_PROVIDER_SITE_OTHER): Payer: BC Managed Care – PPO | Admitting: Internal Medicine

## 2011-04-15 ENCOUNTER — Other Ambulatory Visit (INDEPENDENT_AMBULATORY_CARE_PROVIDER_SITE_OTHER): Payer: BC Managed Care – PPO

## 2011-04-15 VITALS — BP 120/80 | HR 94 | Temp 98.3°F | Ht 72.0 in | Wt 331.1 lb

## 2011-04-15 DIAGNOSIS — Z0000 Encounter for general adult medical examination without abnormal findings: Secondary | ICD-10-CM

## 2011-04-15 DIAGNOSIS — E119 Type 2 diabetes mellitus without complications: Secondary | ICD-10-CM

## 2011-04-15 DIAGNOSIS — Z23 Encounter for immunization: Secondary | ICD-10-CM

## 2011-04-15 DIAGNOSIS — G43909 Migraine, unspecified, not intractable, without status migrainosus: Secondary | ICD-10-CM | POA: Insufficient documentation

## 2011-04-15 DIAGNOSIS — M179 Osteoarthritis of knee, unspecified: Secondary | ICD-10-CM | POA: Insufficient documentation

## 2011-04-15 DIAGNOSIS — M1710 Unilateral primary osteoarthritis, unspecified knee: Secondary | ICD-10-CM | POA: Insufficient documentation

## 2011-04-15 LAB — CBC WITH DIFFERENTIAL/PLATELET
Basophils Absolute: 0.1 10*3/uL (ref 0.0–0.1)
Eosinophils Relative: 2.4 % (ref 0.0–5.0)
Lymphs Abs: 2 10*3/uL (ref 0.7–4.0)
MCV: 83.5 fl (ref 78.0–100.0)
Monocytes Absolute: 0.6 10*3/uL (ref 0.1–1.0)
Neutrophils Relative %: 74.5 % (ref 43.0–77.0)
Platelets: 288 10*3/uL (ref 150.0–400.0)
RDW: 15 % — ABNORMAL HIGH (ref 11.5–14.6)
WBC: 12 10*3/uL — ABNORMAL HIGH (ref 4.5–10.5)

## 2011-04-15 LAB — LIPID PANEL: Cholesterol: 180 mg/dL (ref 0–200)

## 2011-04-15 LAB — BASIC METABOLIC PANEL
BUN: 14 mg/dL (ref 6–23)
Chloride: 101 mEq/L (ref 96–112)
Glucose, Bld: 89 mg/dL (ref 70–99)
Potassium: 4.1 mEq/L (ref 3.5–5.1)
Sodium: 137 mEq/L (ref 135–145)

## 2011-04-15 LAB — HEPATIC FUNCTION PANEL
ALT: 16 U/L (ref 0–35)
AST: 11 U/L (ref 0–37)
Albumin: 4 g/dL (ref 3.5–5.2)
Alkaline Phosphatase: 38 U/L — ABNORMAL LOW (ref 39–117)
Total Bilirubin: 0.4 mg/dL (ref 0.3–1.2)

## 2011-04-15 LAB — URINALYSIS, ROUTINE W REFLEX MICROSCOPIC
Specific Gravity, Urine: 1.02 (ref 1.000–1.030)
Urine Glucose: NEGATIVE
Urobilinogen, UA: 0.2 (ref 0.0–1.0)

## 2011-04-15 LAB — MICROALBUMIN / CREATININE URINE RATIO: Microalb Creat Ratio: 6.3 mg/g (ref 0.0–30.0)

## 2011-04-15 LAB — TSH: TSH: 1.81 u[IU]/mL (ref 0.35–5.50)

## 2011-04-15 LAB — HEMOGLOBIN A1C: Hgb A1c MFr Bld: 6.2 % (ref 4.6–6.5)

## 2011-04-15 NOTE — Assessment & Plan Note (Signed)
Overall doing well, age appropriate education and counseling updated, referrals for preventative services and immunizations addressed, dietary and smoking counseling addressed, most recent labs and ECG reviewed.  I have personally reviewed and have noted: 1) the patient's medical and social history 2) The pt's use of alcohol, tobacco, and illicit drugs 3) The patient's current medications and supplements 4) Functional ability including ADL's, fall risk, home safety risk, hearing and visual impairment 5) Diet and physical activities 6) Evidence for depression or mood disorder 7) The patient's height, weight, and BMI have been recorded in the chart I have made referrals, and provided counseling and education based on review of the above Declines colonoscpy as her mother is in hospice and she is primary caretaker

## 2011-04-15 NOTE — Assessment & Plan Note (Signed)
D/w pt possible celebrex use, delcines at this time

## 2011-04-15 NOTE — Patient Instructions (Signed)
You had the flu shot today Please remember to followup with your GYN for the yearly pap smear and/or mammogram - Dr Neal/GYN Please go to LAB in the Basement for the blood and/or urine tests to be done today Please call the phone number [phone] (the PhoneTree System) for results of testing in 2-3 days;  When calling, simply dial the number, and when prompted enter the MRN number above (the Medical Record Number) and the # key, then the message should start. Continue all other medications as before Please call if you change your mind about the screening colonoscopy Please return in 6 mo with Lab testing done 3-5 days before

## 2011-04-20 NOTE — Progress Notes (Signed)
Subjective:    Patient ID: Pamela [name], female    DOB: [birth year], 55 y.o.   MRN: [id number]  HPI  Here for wellness and f/u;  Overall doing ok;  Pt denies CP, worsening SOB, DOE, wheezing, orthopnea, PND, worsening LE edema, palpitations, dizziness or syncope.  Pt denies neurological change such as new Headache, facial or extremity weakness.  Pt denies polydipsia, polyuria, or low sugar symptoms. Pt states overall good compliance with treatment and medications, good tolerability, and trying to follow lower cholesterol diet.  Pt denies worsening depressive symptoms, suicidal ideation or panic. No fever, wt loss, night sweats, loss of appetite, or other constitutional symptoms.  Pt states good ability with ADL's, low fall risk, home safety reviewed and adequate, no significant changes in hearing or vision, and occasionally active with exercise. No other concerns Past Medical History  Diagnosis Date  . DIABETES MELLITUS, TYPE II 12/31/2007  . ANXIETY 12/31/2007  . DEPRESSION 12/31/2007  . HYPERTENSION 12/31/2007  . ALLERGIC RHINITIS 12/31/2007  . Right knee DJD   . Rosacea 10/11/2010  . Diastolic dysfunction 10/11/2010  . Atrial fibrillation   . Migraine   . PNA (pneumonia)     in her 72s   Past Surgical History  Procedure Date  . 2 d&c     1980s    reports that she has never smoked. She does not have any smokeless tobacco history on file. She reports that she drinks alcohol. Her drug history not on file. family history includes Arthritis in her other; Cancer in her other; Diabetes in her other; Heart disease in her other; Mental illness in her other; and Stroke in her other. No Known Allergies Current Outpatient Prescriptions on File Prior to Visit  Medication Sig Dispense Refill  . diltiazem (CARDIZEM CD) 360 MG 24 hr capsule Take 360 mg by mouth daily.        . furosemide (LASIX) 20 MG tablet Take 20 mg by mouth every other day.        Marland Kitchen glimepiride (AMARYL) 2 MG tablet Take 1 by mouth  with breakfast and 1 by mouth with dinner  180 tablet  3  . metFORMIN (GLUCOPHAGE) 500 MG tablet TAKE 1 TABLET BY MOUTH TWICE DAILY  60 tablet  5  . metoprolol tartrate (LOPRESSOR) 25 MG tablet Take 25 mg by mouth 2 (two) times daily.        . potassium chloride SA (K-DUR,KLOR-CON) 20 MEQ tablet Take 20 mEq by mouth every other day.        . Rivaroxaban (XARELTO) 20 MG TABS Take 1 tablet by mouth daily.  30 tablet  6  . spironolactone (ALDACTONE) 25 MG tablet Take 25 mg by mouth daily.        Marland Kitchen DISCONTD: furosemide (LASIX) 20 MG tablet Take 1 tablet (20 mg total) by mouth daily. As needed  30 tablet  3  . DISCONTD: potassium chloride SA (K-DUR,KLOR-CON) 20 MEQ tablet Take 1 tablet (20 mEq total) by mouth daily. As needed  30 tablet  3   Review of Systems Review of Systems  Constitutional: Negative for diaphoresis, activity change, appetite change and unexpected weight change.  HENT: Negative for hearing loss, ear pain, facial swelling, mouth sores and neck stiffness.   Eyes: Negative for pain, redness and visual disturbance.  Respiratory: Negative for shortness of breath and wheezing.   Cardiovascular: Negative for chest pain and palpitations.  Gastrointestinal: Negative for diarrhea, blood in stool, abdominal distention and rectal pain.  Genitourinary: Negative for hematuria, flank pain and decreased urine volume.  Musculoskeletal: Negative for myalgias and joint swelling.  Skin: Negative for color change and wound.  Neurological: Negative for syncope and numbness.  Hematological: Negative for adenopathy.  Psychiatric/Behavioral: Negative for hallucinations, self-injury, decreased concentration and agitation.      Objective:   Physical Exam BP 120/80  Pulse 94  Temp(Src) 98.3 F (36.8 C) (Oral)  Ht 6' (1.829 m)  Wt 331 lb 2 oz (150.197 kg)  BMI 44.91 kg/m2  SpO2 95% Physical Exam  VS noted Constitutional: Pt is oriented to person, place, and time. Appears well-developed and  well-nourished.  HENT:  Head: Normocephalic and atraumatic.  Right Ear: External ear normal.  Left Ear: External ear normal.  Nose: Nose normal.  Mouth/Throat: Oropharynx is clear and moist.  Eyes: Conjunctivae and EOM are normal. Pupils are equal, round, and reactive to light.  Neck: Normal range of motion. Neck supple. No JVD present. No tracheal deviation present.  Cardiovascular: Normal rate, regular rhythm, normal heart sounds and intact distal pulses.   Pulmonary/Chest: Effort normal and breath sounds normal.  Abdominal: Soft. Bowel sounds are normal. There is no tenderness.  Musculoskeletal: Normal range of motion. Exhibits no edema.  Lymphadenopathy:  Has no cervical adenopathy.  Neurological: Pt is alert and oriented to person, place, and time. Pt has normal reflexes. No cranial nerve deficit.  Skin: Skin is warm and dry. No rash noted.  Psychiatric:  Has  normal mood and affect. Behavior is normal.     Assessment & Plan:

## 2011-04-20 NOTE — Assessment & Plan Note (Signed)
stable overall by hx and exam, most recent data reviewed with pt, and pt to continue medical treatment as before  Lab Results  Component Value Date   HGBA1C 6.2 04/15/2011

## 2011-04-28 ENCOUNTER — Other Ambulatory Visit: Payer: Self-pay | Admitting: Internal Medicine

## 2011-04-28 ENCOUNTER — Other Ambulatory Visit: Payer: Self-pay | Admitting: Nurse Practitioner

## 2011-05-03 ENCOUNTER — Other Ambulatory Visit: Payer: Self-pay | Admitting: Nurse Practitioner

## 2011-05-06 ENCOUNTER — Telehealth: Payer: Self-pay | Admitting: Internal Medicine

## 2011-05-06 NOTE — Telephone Encounter (Signed)
Pt has problem with tooth and may have to be pulled, on blood thinner, what to do?

## 2011-05-06 NOTE — Telephone Encounter (Signed)
Pt has an abcess in her tooth.  She has another appt with her dentist to decide if it needs to be pulled or if they will do a crown.  She states she has already been on 2 courses of amoxicillin.  She wants to know if she can stop the xarelto if she needs an extraction.  She will call back after she sees the dentist tomorrow with more information.

## 2011-05-08 ENCOUNTER — Other Ambulatory Visit: Payer: Self-pay | Admitting: Nurse Practitioner

## 2011-05-09 NOTE — Telephone Encounter (Signed)
..   Requested Prescriptions   Pending Prescriptions Disp Refills  . CARDIZEM CD 360 MG 24 hr capsule [Pharmacy Med Name: CARDIZEM CD 360MG  CAPSULES] 30 capsule 0    Sig: TAKE 1 CAPSULE BY MOUTH EVERY DAY

## 2011-06-04 ENCOUNTER — Other Ambulatory Visit: Payer: Self-pay | Admitting: Internal Medicine

## 2011-06-14 ENCOUNTER — Other Ambulatory Visit: Payer: Self-pay | Admitting: Internal Medicine

## 2011-06-16 ENCOUNTER — Other Ambulatory Visit: Payer: Self-pay

## 2011-06-16 ENCOUNTER — Ambulatory Visit (HOSPITAL_COMMUNITY)
Admission: RE | Admit: 2011-06-16 | Discharge: 2011-06-16 | Disposition: A | Discharge status: Home / Self Care | Payer: BC Managed Care – PPO | Source: Ambulatory Visit | Attending: Internal Medicine | Admitting: Internal Medicine

## 2011-06-16 ENCOUNTER — Other Ambulatory Visit: Payer: Self-pay | Admitting: Internal Medicine

## 2011-06-16 DIAGNOSIS — I5032 Chronic diastolic (congestive) heart failure: Secondary | ICD-10-CM | POA: Insufficient documentation

## 2011-06-16 DIAGNOSIS — I4891 Unspecified atrial fibrillation: Secondary | ICD-10-CM | POA: Insufficient documentation

## 2011-06-16 DIAGNOSIS — I509 Heart failure, unspecified: Secondary | ICD-10-CM

## 2011-06-16 MED ORDER — FUROSEMIDE 20 MG PO TABS: 20.0000 mg | ORAL_TABLET | Freq: Every day | ORAL | Status: DC

## 2011-06-16 MED ORDER — POTASSIUM CHLORIDE CRYS ER 20 MEQ PO TBCR: 20.0000 meq | EXTENDED_RELEASE_TABLET | Freq: Every day | ORAL | Status: DC

## 2011-06-16 MED ORDER — LISINOPRIL 10 MG PO TABS: 10.0000 mg | ORAL_TABLET | Freq: Every day | ORAL | Status: DC

## 2011-06-16 MED ORDER — DILTIAZEM HCL ER COATED BEADS 360 MG PO CP24: 360.0000 mg | ORAL_CAPSULE | Freq: Every day | ORAL | Status: DC

## 2011-06-16 NOTE — Assessment & Plan Note (Signed)
Would prefer LDL to be below 100 with DM.  Would suggest starting a statin but will leave decision up to the patient and Dr. [name].

## 2011-06-16 NOTE — Patient Instructions (Addendum)
Increase lasix 20 mg daily.  Increase potassium daily.  Start taking lisinopril 10 mg daily.  Get labs drawn in 7-10 days.  Follow up 3 months.  Do the following things EVERYDAY: 1) Weigh yourself in the morning before breakfast. Write it down and keep it in a log. 2) Take your medicines as prescribed 3) Eat low salt foods-Limit salt (sodium) to 2000mg  per day.  4) Stay as active as you can everyday

## 2011-06-16 NOTE — Assessment & Plan Note (Signed)
Continue with rate control.  Continue anticoagulation with xarelto.

## 2011-06-16 NOTE — Progress Notes (Signed)
HPI:  Pamela [name] is a 56 y/o with h/o DM2, obesity, HTN. Admitted in April 2012 with acute diastolic HF and CP in setting of rapid atrial fib.  Underwent cardiac cath in April 2012 which showed normal coronaries. Did very well with rate control and diuresis. Started on coumadin. We saw her in May and switched her to Xarelto due to problems regulating INRs. She underwent DC-CV in June which failed so we opted for rate control strategy.  She feels ok today.  She continues to care for her mother who has CHF and non-alcoholic cirrhosis.  She was recently taken out of hospice care which has caused increased stress on the patient.  Over last several days noted increase SOB.  Always fatigued.  Not weighing daily.  Not exercising regularly.  Continues to take lasix every other day.  Not missing any medications.  No bleeding issues on xarelto.       ROS: All systems negative except as listed in HPI, PMH and Problem List.  Past Medical History  Diagnosis Date  . DIABETES MELLITUS, TYPE II 12/31/2007  . ANXIETY 12/31/2007  . DEPRESSION 12/31/2007  . HYPERTENSION 12/31/2007  . ALLERGIC RHINITIS 12/31/2007  . Right knee DJD   . Rosacea 10/11/2010  . Diastolic dysfunction 10/11/2010  . Atrial fibrillation   . Migraine   . PNA (pneumonia)     in her 46s    Current Outpatient Prescriptions  Medication Sig Dispense Refill  . diltiazem (CARDIZEM CD) 360 MG 24 hr capsule TAKE ONE CAPSULE BY MOUTH DAILY  30 capsule  6  . furosemide (LASIX) 20 MG tablet Take 20 mg by mouth every other day.        Marland Kitchen glimepiride (AMARYL) 2 MG tablet Take 1 by mouth with breakfast and 1 by mouth with dinner  180 tablet  3  . metFORMIN (GLUCOPHAGE) 500 MG tablet TAKE 1 TABLET BY MOUTH TWICE DAILY  60 tablet  5  . metoprolol tartrate (LOPRESSOR) 25 MG tablet TAKE 1 TABLET BY MOUTH TWICE DAILY  60 tablet  5  . potassium chloride SA (K-DUR,KLOR-CON) 20 MEQ tablet Take 20 mEq by mouth every other day.        . spironolactone (ALDACTONE) 25  MG tablet TAKE 1 TABLET BY MOUTH EVERY DAY  30 tablet  3  . XARELTO 20 MG TABS TAKE 1 TABLET BY MOUTH DAILY  30 tablet  7  . DISCONTD: furosemide (LASIX) 20 MG tablet Take 1 tablet (20 mg total) by mouth daily. As needed  30 tablet  3  . DISCONTD: potassium chloride SA (K-DUR,KLOR-CON) 20 MEQ tablet Take 1 tablet (20 mEq total) by mouth daily. As needed  30 tablet  3     PHYSICAL EXAM: Filed Vitals:   06/16/11 1442  BP: 132/82  Pulse: 82  Weight: 335 lb 8 oz (152.182 kg)  SpO2: 98%   General: Obese  Well appearing. No resp difficulty HEENT: normal Neck: supple. JVP hard to see, 7-8.   Carotids 2+ bilaterally; no bruits. No lymphadenopathy or thryomegaly appreciated. Cor: PMI normal. Iregular rate & rhythm. No rubs, gallops or murmurs. Lungs: clear Abdomen: obese soft, nontender, nondistended.  Good bowel sounds. Extremities: no cyanosis, clubbing, rash, 1+ edema Neuro: alert & orientedx3, cranial nerves grossly intact. Moves all 4 extremities w/o difficulty. Affect pleasant.  ECG:  AF 86.  Non-specific STs    ASSESSMENT & PLAN:

## 2011-06-16 NOTE — Assessment & Plan Note (Addendum)
Volume status difficult to assess but appears mildly elevated.  Will increase lasix 20 mg daily as well as potassium to daily.  With diabetes will add lisinopril for renal protection.  Check BMET in 1 week to follow K.  Follow up 3 months.    Patient seen and examined with Ulyess Blossom PA-C. We discussed all aspects of the encounter. I agree with the assessment and plan as stated above. Pamela [name] appears to have mild volume overload in setting of dietary noncompliance. Will increase lasix to daily dosing. Emphasized need for dietary compliance. Agree with ACE-I for nephroprotection in setting of DM2. PCP to consider statin to get LDL < 100 in setting of DM2.

## 2011-06-16 NOTE — Assessment & Plan Note (Signed)
Add lisinopril, as above.

## 2011-06-18 NOTE — Progress Notes (Signed)
Encounter addended by: Standley Brooking on: 06/18/2011  9:37 AM<BR>     Documentation filed: Charges VN

## 2011-06-18 NOTE — Progress Notes (Signed)
Encounter addended by: Sanda Linger on: 06/18/2011  9:49 AM<BR>     Documentation filed: Charges VN

## 2011-06-25 ENCOUNTER — Encounter: Payer: Self-pay | Admitting: Internal Medicine

## 2011-08-03 ENCOUNTER — Other Ambulatory Visit: Payer: Self-pay | Admitting: Internal Medicine

## 2011-08-24 ENCOUNTER — Other Ambulatory Visit: Payer: Self-pay | Admitting: Internal Medicine

## 2011-09-17 ENCOUNTER — Ambulatory Visit (HOSPITAL_COMMUNITY)
Admission: RE | Admit: 2011-09-17 | Discharge: 2011-09-17 | Disposition: A | Discharge status: Home / Self Care | Payer: BC Managed Care – PPO | Source: Ambulatory Visit | Attending: Internal Medicine | Admitting: Internal Medicine

## 2011-09-17 VITALS — BP 110/58 | HR 77 | Wt 340.0 lb

## 2011-09-17 DIAGNOSIS — I509 Heart failure, unspecified: Secondary | ICD-10-CM

## 2011-09-17 DIAGNOSIS — I5032 Chronic diastolic (congestive) heart failure: Secondary | ICD-10-CM

## 2011-09-17 DIAGNOSIS — I4891 Unspecified atrial fibrillation: Secondary | ICD-10-CM

## 2011-09-17 LAB — BASIC METABOLIC PANEL
BUN: 23 mg/dL (ref 6–23)
Creatinine, Ser: 1.29 mg/dL — ABNORMAL HIGH (ref 0.50–1.10)
GFR calc Af Amer: 53 mL/min — ABNORMAL LOW (ref >90)
GFR calc non Af Amer: 46 mL/min — ABNORMAL LOW (ref >90)
Potassium: 5.2 mEq/L — ABNORMAL HIGH (ref 3.5–5.1)

## 2011-09-17 MED ORDER — FUROSEMIDE 20 MG PO TABS: 40.0000 mg | ORAL_TABLET | Freq: Every day | ORAL | Status: DC

## 2011-09-17 MED ORDER — POTASSIUM CHLORIDE CRYS ER 20 MEQ PO TBCR: 20.0000 meq | EXTENDED_RELEASE_TABLET | Freq: Every day | ORAL | Status: DC

## 2011-09-17 NOTE — Patient Instructions (Signed)
Increase Furosemide to 40 mg daily  Labs today  Your physician recommends that you schedule a follow-up appointment in: 2 weeks

## 2011-09-17 NOTE — Progress Notes (Signed)
HPI:  Pamela [name] is a 56 y/o with h/o DM2, obesity, HTN. Admitted in April 2012 with acute diastolic HF and CP in setting of rapid atrial fib.  Underwent cardiac cath in April 2012 which showed normal coronaries. Did very well with rate control and diuresis. Started on coumadin. We saw her in May and switched her to Xarelto due to problems regulating INRs. She underwent DC-CV in June which failed so we opted for rate control strategy.  We saw her last in January and had mild volume overload so lasix increased to 20 daily (from every other day) and also started lisinopril. Continues to gain weight steadily. Up 5 pounds from last visit. Up 35 pounds from last year. Feels more fatigued and SOB. Drinks a lot of water every day. Drink 2-4 20oz bottles of water per day. Mild edema. Not weighing daily. No palpitations/CP. Still working full time. Compliant with all meds including Xarelto.   Recently through her backout and is having back pain.   ROS: All systems negative except as listed in HPI, PMH and Problem List.  Past Medical History  Diagnosis Date  . DIABETES MELLITUS, TYPE II 12/31/2007  . ANXIETY 12/31/2007  . DEPRESSION 12/31/2007  . HYPERTENSION 12/31/2007  . ALLERGIC RHINITIS 12/31/2007  . Right knee DJD   . Rosacea 10/11/2010  . Diastolic dysfunction 10/11/2010  . Atrial fibrillation   . Migraine   . PNA (pneumonia)     in her 25s    Current Outpatient Prescriptions  Medication Sig Dispense Refill  . diltiazem (CARDIZEM CD) 360 MG 24 hr capsule Take 1 capsule (360 mg total) by mouth daily.  30 capsule  6  . furosemide (LASIX) 20 MG tablet Take 1 tablet (20 mg total) by mouth daily.  30 tablet  6  . glimepiride (AMARYL) 2 MG tablet Take 1 by mouth with breakfast and 1 by mouth with dinner  180 tablet  3  . lisinopril (PRINIVIL,ZESTRIL) 10 MG tablet Take 1 tablet (10 mg total) by mouth daily.  30 tablet  6  . metFORMIN (GLUCOPHAGE) 500 MG tablet TAKE 1 TABLET BY MOUTH TWICE DAILY  60 tablet   8  . metoprolol tartrate (LOPRESSOR) 25 MG tablet TAKE 1 TABLET BY MOUTH TWICE DAILY  60 tablet  5  . potassium chloride SA (K-DUR,KLOR-CON) 20 MEQ tablet Take 1 tablet (20 mEq total) by mouth daily.  30 tablet  6  . spironolactone (ALDACTONE) 25 MG tablet TAKE 1 TABLET BY MOUTH DAILY  30 tablet  2  . XARELTO 20 MG TABS TAKE 1 TABLET BY MOUTH DAILY  30 tablet  7  . DISCONTD: furosemide (LASIX) 20 MG tablet Take 1 tablet (20 mg total) by mouth daily. As needed  30 tablet  3  . DISCONTD: potassium chloride SA (K-DUR,KLOR-CON) 20 MEQ tablet Take 1 tablet (20 mEq total) by mouth daily. As needed  30 tablet  3     PHYSICAL EXAM: Filed Vitals:   09/17/11 1603  BP: 110/58  Pulse: 77  Weight: 340 lb (154.223 kg)  SpO2: 98%   General: Obese  Well appearing. No resp difficulty HEENT: normal Neck: supple. JVP hard to see   Carotids 2+ bilaterally; no bruits. No lymphadenopathy or thryomegaly appreciated. Cor: PMI normal. Iregular rate & rhythm. No rubs, gallops or murmurs. Lungs: clear Abdomen: obese soft, nontender, nondistended.  Good bowel sounds. Extremities: no cyanosis, clubbing, rash, 2+ edema Neuro: alert & orientedx3, cranial nerves grossly intact. Moves all 4 extremities w/o  difficulty. Affect pleasant.     ASSESSMENT & PLAN:

## 2011-09-17 NOTE — Assessment & Plan Note (Signed)
Chronic. Well rate controlled. Continue Xarelto.

## 2011-09-17 NOTE — Assessment & Plan Note (Signed)
Weight continues to increase. Suspect due in part to volume overload. Now NYHA II-III. Will increase lasix to 40 daily. Have stressed the need for fluid restriction and compliance with daily weights. Will check labs today and see her back in 2 weeks with repeat labs.

## 2011-10-01 ENCOUNTER — Ambulatory Visit (HOSPITAL_COMMUNITY)
Admission: RE | Admit: 2011-10-01 | Discharge: 2011-10-01 | Disposition: A | Discharge status: Home / Self Care | Payer: BC Managed Care – PPO | Source: Ambulatory Visit | Attending: Internal Medicine | Admitting: Internal Medicine

## 2011-10-01 VITALS — BP 100/68 | HR 99 | Wt 340.8 lb

## 2011-10-01 DIAGNOSIS — I5032 Chronic diastolic (congestive) heart failure: Secondary | ICD-10-CM | POA: Insufficient documentation

## 2011-10-01 DIAGNOSIS — I509 Heart failure, unspecified: Secondary | ICD-10-CM

## 2011-10-01 DIAGNOSIS — I4891 Unspecified atrial fibrillation: Secondary | ICD-10-CM

## 2011-10-01 MED ORDER — FUROSEMIDE 20 MG PO TABS: 40.0000 mg | ORAL_TABLET | Freq: Two times a day (BID) | ORAL | Status: DC

## 2011-10-01 NOTE — Patient Instructions (Addendum)
Follow up in 3 months  Do the following things EVERYDAY: 1) Weigh yourself in the morning before breakfast. Write it down and keep it in a log. 2) Take your medicines as prescribed 3) Eat low salt foods--Limit salt (sodium) to 2000mg per day.  4) Stay as active as you can everyday 

## 2011-10-01 NOTE — Progress Notes (Signed)
Patient ID: Pamela [name], female   DOB: 26-Mar-1956, 56 y.o.   MRN: [id number] HPI: Pamela [name] is a 56 y/o with h/o DM2, obesity, HTN. Admitted in April 2012 with acute diastolic HF and CP in setting of rapid atrial fib.  Underwent cardiac cath in April 2012 which showed normal coronaries. Did very well with rate control and diuresis. Started on coumadin. We saw her in May and switched her to Xarelto due to problems regulating INRs. She underwent DC-CV in June which failed so we opted for rate control strategy.  Weight up 35 pounds over the last year.   She returns for follow up. She was evaluated 2 weeks ago and her Lasix was doubled. She has not noticed a difference. Denies SOB/PND/Orthopnea/CP. She is weighing. Works full time. She cares for her mother when she is not working.  Chronic lower extremity edema.  Complaint with medications.    ROS: All systems negative except as listed in HPI, PMH and Problem List.  Past Medical History  Diagnosis Date  . DIABETES MELLITUS, TYPE II 12/31/2007  . ANXIETY 12/31/2007  . DEPRESSION 12/31/2007  . HYPERTENSION 12/31/2007  . ALLERGIC RHINITIS 12/31/2007  . Right knee DJD   . Rosacea 10/11/2010  . Diastolic dysfunction 10/11/2010  . Atrial fibrillation   . Migraine   . PNA (pneumonia)     in her 73s    Current Outpatient Prescriptions  Medication Sig Dispense Refill  . diltiazem (CARDIZEM CD) 360 MG 24 hr capsule Take 1 capsule (360 mg total) by mouth daily.  30 capsule  6  . furosemide (LASIX) 20 MG tablet Take 2 tablets (40 mg total) by mouth daily.  30 tablet  6  . glimepiride (AMARYL) 2 MG tablet Take 1 by mouth with breakfast and 1 by mouth with dinner  180 tablet  3  . lisinopril (PRINIVIL,ZESTRIL) 10 MG tablet Take 1 tablet (10 mg total) by mouth daily.  30 tablet  6  . metFORMIN (GLUCOPHAGE) 500 MG tablet TAKE 1 TABLET BY MOUTH TWICE DAILY  60 tablet  8  . metoprolol tartrate (LOPRESSOR) 25 MG tablet TAKE 1 TABLET BY MOUTH TWICE DAILY  60 tablet   5  . potassium chloride SA (K-DUR,KLOR-CON) 20 MEQ tablet Take 1 tablet (20 mEq total) by mouth daily.  30 tablet  6  . spironolactone (ALDACTONE) 25 MG tablet TAKE 1 TABLET BY MOUTH DAILY  30 tablet  2  . XARELTO 20 MG TABS TAKE 1 TABLET BY MOUTH DAILY  30 tablet  7  . DISCONTD: furosemide (LASIX) 20 MG tablet Take 1 tablet (20 mg total) by mouth daily. As needed  30 tablet  3  . DISCONTD: potassium chloride SA (K-DUR,KLOR-CON) 20 MEQ tablet Take 1 tablet (20 mEq total) by mouth daily. As needed  30 tablet  3     PHYSICAL EXAM: Filed Vitals:   10/01/11 1615  BP: 100/68  Pulse: 99  Weight: 340 lb 12.8 oz (154.586 kg)  SpO2: 98%   General: Obese  Well appearing. No resp difficulty HEENT: normal Neck: supple. JVP hard to see   Carotids 2+ bilaterally; no bruits. No lymphadenopathy or thryomegaly appreciated. Cor: PMI normal. Iregular rate & rhythm. No rubs, gallops or murmurs. Lungs: clear Abdomen: obese soft, nontender, nondistended.  Good bowel sounds. Extremities: no cyanosis, clubbing, rash, 1+ edema Neuro: alert & orientedx3, cranial nerves grossly intact. Moves all 4 extremities w/o difficulty. Affect pleasant.     ASSESSMENT & PLAN:

## 2011-10-01 NOTE — Assessment & Plan Note (Addendum)
Volume status improved with increase Lasix dosage. Continue Lasix 40 mg daily. Reviewed BMET results. Potassium 5.2. Will repeat BMET. If potassium remains elevated will decrease Spironolactone to 12.5 mg daily. Reinforced daily weights and medications compliance. Follow up in 3 months.   Patient seen and examined with Tonye Becket, NP. We discussed all aspects of the encounter. I agree with the assessment and plan as stated above. Volume status much better. Need to follow electrolytes closely as above. Reinforced need for daily weights and reviewed use of sliding scale diuretics.

## 2011-10-06 ENCOUNTER — Encounter (HOSPITAL_COMMUNITY): Payer: Self-pay

## 2011-10-15 ENCOUNTER — Ambulatory Visit: Payer: BC Managed Care – PPO | Admitting: Internal Medicine

## 2011-10-15 DIAGNOSIS — Z0289 Encounter for other administrative examinations: Secondary | ICD-10-CM

## 2011-10-17 NOTE — Assessment & Plan Note (Signed)
Continue with rate control (failed DC-CV).  Continue anticoagulation with xarelto.

## 2011-11-03 ENCOUNTER — Other Ambulatory Visit: Payer: Self-pay | Admitting: Internal Medicine

## 2011-11-29 ENCOUNTER — Other Ambulatory Visit: Payer: Self-pay | Admitting: Internal Medicine

## 2011-12-01 ENCOUNTER — Other Ambulatory Visit: Payer: Self-pay

## 2011-12-01 ENCOUNTER — Other Ambulatory Visit: Payer: Self-pay | Admitting: Internal Medicine

## 2011-12-01 MED ORDER — GLIMEPIRIDE 2 MG PO TABS: 2.0000 mg | ORAL_TABLET | Freq: Every day | ORAL | Status: DC

## 2011-12-21 ENCOUNTER — Other Ambulatory Visit: Payer: Self-pay | Admitting: Internal Medicine

## 2011-12-22 ENCOUNTER — Other Ambulatory Visit (HOSPITAL_COMMUNITY): Payer: Self-pay | Admitting: *Deleted

## 2011-12-22 MED ORDER — RIVAROXABAN 20 MG PO TABS: 20.0000 mg | ORAL_TABLET | Freq: Every day | ORAL | Status: DC

## 2012-01-16 ENCOUNTER — Encounter: Payer: Self-pay | Admitting: Internal Medicine

## 2012-01-16 ENCOUNTER — Ambulatory Visit (INDEPENDENT_AMBULATORY_CARE_PROVIDER_SITE_OTHER): Payer: BC Managed Care – PPO | Admitting: Internal Medicine

## 2012-01-16 VITALS — BP 100/70 | HR 86 | Temp 97.2°F | Ht 72.0 in | Wt 347.0 lb

## 2012-01-16 DIAGNOSIS — E119 Type 2 diabetes mellitus without complications: Secondary | ICD-10-CM

## 2012-01-16 DIAGNOSIS — Z0000 Encounter for general adult medical examination without abnormal findings: Secondary | ICD-10-CM

## 2012-01-16 DIAGNOSIS — I10 Essential (primary) hypertension: Secondary | ICD-10-CM

## 2012-01-16 DIAGNOSIS — G609 Hereditary and idiopathic neuropathy, unspecified: Secondary | ICD-10-CM

## 2012-01-16 DIAGNOSIS — G629 Polyneuropathy, unspecified: Secondary | ICD-10-CM

## 2012-01-16 MED ORDER — DULOXETINE HCL 60 MG PO CPEP: 60.0000 mg | ORAL_CAPSULE | Freq: Every day | ORAL | Status: DC

## 2012-01-16 NOTE — Patient Instructions (Addendum)
Take all new medications as prescribed Continue all other medications as before Please return in 3 mo with Lab testing done 3-5 days before  

## 2012-01-17 ENCOUNTER — Encounter: Payer: Self-pay | Admitting: Internal Medicine

## 2012-01-17 NOTE — Assessment & Plan Note (Signed)
stable overall by hx and exam, most recent data reviewed with pt, and pt to continue medical treatment as before  Lab Results  Component Value Date   HGBA1C 6.2 04/15/2011    

## 2012-01-17 NOTE — Assessment & Plan Note (Signed)
stable overall by hx and exam, most recent data reviewed with pt, and pt to continue medical treatment as before BP Readings from Last 3 Encounters:  01/16/12 100/70  10/01/11 100/68  09/17/11 110/58

## 2012-01-17 NOTE — Progress Notes (Signed)
Subjective:    Patient ID: Pamela [name], female    DOB: [birth year], 56 y.o.   MRN: [id number]  HPI  Here with c/o 6 mo or more numb tingling discomfort to the feet stocking glove like, after starting with toes before that, without trauma, red, swelling, or claudication; has hx of DM, saw Ad on TV and decided she might be approp for lyrica or similar.  Pt denies chest pain, increased sob or doe, wheezing, orthopnea, PND, increased LE swelling, palpitations, dizziness or syncope.   Pt denies polydipsia, polyuria. Pt denies new neurological symptoms such as new headache, or facial or extremity weakness.   Pt denies fever, wt loss, night sweats, loss of appetite, or other constitutional symptoms  No significant worsening back pain or radicular symptoms. Past Medical History  Diagnosis Date  . DIABETES MELLITUS, TYPE II 12/31/2007  . ANXIETY 12/31/2007  . DEPRESSION 12/31/2007  . HYPERTENSION 12/31/2007  . ALLERGIC RHINITIS 12/31/2007  . Right knee DJD   . Rosacea 10/11/2010  . Diastolic dysfunction 10/11/2010  . Atrial fibrillation   . Migraine   . PNA (pneumonia)     in her 64s   Past Surgical History  Procedure Date  . 2 d&c     1980s    reports that she has never smoked. She does not have any smokeless tobacco history on file. She reports that she drinks alcohol. Her drug history not on file. family history includes Arthritis in her other; Cancer in her other; Diabetes in her other; Heart disease in her other; Mental illness in her other; and Stroke in her other. No Known Allergies Current Outpatient Prescriptions on File Prior to Visit  Medication Sig Dispense Refill  . diltiazem (CARDIZEM CD) 360 MG 24 hr capsule Take 1 capsule (360 mg total) by mouth daily.  30 capsule  6  . furosemide (LASIX) 20 MG tablet Take 2 tablets (40 mg total) by mouth 2 (two) times daily.  60 tablet  6  . glimepiride (AMARYL) 2 MG tablet Take 1 tablet (2 mg total) by mouth daily before breakfast.  180 tablet  0    . lisinopril (PRINIVIL,ZESTRIL) 10 MG tablet Take 1 tablet (10 mg total) by mouth daily.  30 tablet  6  . metFORMIN (GLUCOPHAGE) 500 MG tablet TAKE 1 TABLET BY MOUTH TWICE DAILY  60 tablet  8  . metoprolol tartrate (LOPRESSOR) 25 MG tablet TAKE 1 TABLET BY MOUTH TWICE DAILY  60 tablet  4  . potassium chloride SA (K-DUR,KLOR-CON) 20 MEQ tablet Take 1 tablet (20 mEq total) by mouth daily.  30 tablet  6  . Rivaroxaban (XARELTO) 20 MG TABS Take 1 tablet (20 mg total) by mouth daily.  30 tablet  6  . spironolactone (ALDACTONE) 25 MG tablet TAKE 1 TABLET BY MOUTH DAILY  30 tablet  2  . DULoxetine (CYMBALTA) 60 MG capsule Take 1 capsule (60 mg total) by mouth daily.  30 capsule  11   Review of Systems Constitutional: Negative for diaphoresis and unexpected weight change.  Eyes: Negative for photophobia and visual disturbance.  Respiratory: Negative for choking and stridor.   Gastrointestinal: Negative for vomiting and blood in stool.  Musculoskeletal: Negative for gait problem.  Skin: Negative for color change and wound.  Neurological: Negative for tremors  Psychiatric/Behavioral: Negative for decreased concentration. The patient is not hyperactive.      Objective:   Physical Exam BP 100/70  Pulse 86  Temp 97.2 F (36.2 C) (Oral)  Ht 6' (1.829 m)  Wt 347 lb (157.398 kg)  BMI 47.06 kg/m2  SpO2 96% Physical Exam  VS noted Constitutional: Pt appears well-developed and well-nourished.  HENT: Head: Normocephalic.  Right Ear: External ear normal.  Left Ear: External ear normal.  Eyes: Conjunctivae and EOM are normal. Pupils are equal, round, and reactive to light.  Neck: Normal range of motion. Neck supple.  Cardiovascular: Normal rate and regular rhythm.   Pulmonary/Chest: Effort normal and breath sounds normal.  Neurological: Pt is alert. Not confused, motor/dtr intact, mild decreased sens to LT dorsal feet.  Skin: Skin is warm. No erythema. No rash Psychiatric: Pt behavior is normal.  Thought content normal. not depressed affect    Assessment & Plan:

## 2012-01-17 NOTE — Assessment & Plan Note (Signed)
clinical dx, declines EMG, I dont think needs MRI for back without signficant back pain, most likely related to DM, to check B12 with next labs, gave cymbalta 30 for 1 wk, then 60 qd after,  to f/u any worsening symptoms or concerns

## 2012-01-20 ENCOUNTER — Other Ambulatory Visit (HOSPITAL_COMMUNITY): Payer: Self-pay | Admitting: Physician Assistant

## 2012-01-26 ENCOUNTER — Other Ambulatory Visit (HOSPITAL_COMMUNITY): Payer: Self-pay | Admitting: Physician Assistant

## 2012-01-26 ENCOUNTER — Other Ambulatory Visit: Payer: Self-pay | Admitting: *Deleted

## 2012-01-26 MED ORDER — FUROSEMIDE 20 MG PO TABS: 40.0000 mg | ORAL_TABLET | Freq: Two times a day (BID) | ORAL | Status: DC

## 2012-02-13 ENCOUNTER — Other Ambulatory Visit (HOSPITAL_COMMUNITY): Payer: Self-pay

## 2012-02-13 MED ORDER — DILTIAZEM HCL ER COATED BEADS 360 MG PO CP24: 360.0000 mg | ORAL_CAPSULE | Freq: Every day | ORAL | Status: DC

## 2012-02-13 NOTE — Telephone Encounter (Signed)
..   Requested Prescriptions   Signed Prescriptions Disp Refills  . diltiazem (CARDIZEM CD) 360 MG 24 hr capsule 30 capsule 6    Sig: Take 1 capsule (360 mg total) by mouth daily.    Authorizing Provider: Dolores Patty    Ordering User: Christella Hartigan, Ulysses Alper Judie Petit

## 2012-03-02 ENCOUNTER — Other Ambulatory Visit: Payer: Self-pay | Admitting: Internal Medicine

## 2012-04-05 ENCOUNTER — Other Ambulatory Visit: Payer: Self-pay

## 2012-04-05 MED ORDER — METOPROLOL TARTRATE 25 MG PO TABS: 25.0000 mg | ORAL_TABLET | Freq: Two times a day (BID) | ORAL | Status: DC

## 2012-04-22 ENCOUNTER — Other Ambulatory Visit (INDEPENDENT_AMBULATORY_CARE_PROVIDER_SITE_OTHER): Payer: BC Managed Care – PPO

## 2012-04-22 DIAGNOSIS — G609 Hereditary and idiopathic neuropathy, unspecified: Secondary | ICD-10-CM

## 2012-04-22 DIAGNOSIS — E785 Hyperlipidemia, unspecified: Secondary | ICD-10-CM

## 2012-04-22 DIAGNOSIS — E119 Type 2 diabetes mellitus without complications: Secondary | ICD-10-CM

## 2012-04-22 DIAGNOSIS — Z0000 Encounter for general adult medical examination without abnormal findings: Secondary | ICD-10-CM

## 2012-04-22 DIAGNOSIS — G629 Polyneuropathy, unspecified: Secondary | ICD-10-CM

## 2012-04-22 LAB — LIPID PANEL
Cholesterol: 203 mg/dL — ABNORMAL HIGH (ref 0–200)
Total CHOL/HDL Ratio: 4

## 2012-04-22 LAB — HEPATIC FUNCTION PANEL
ALT: 14 U/L (ref 0–35)
Alkaline Phosphatase: 53 U/L (ref 39–117)
Bilirubin, Direct: 0.1 mg/dL (ref 0.0–0.3)
Total Protein: 7.4 g/dL (ref 6.0–8.3)

## 2012-04-22 LAB — MICROALBUMIN / CREATININE URINE RATIO
Creatinine,U: 138.4 mg/dL
Microalb, Ur: 5.1 mg/dL — ABNORMAL HIGH (ref 0.0–1.9)

## 2012-04-22 LAB — BASIC METABOLIC PANEL
BUN: 26 mg/dL — ABNORMAL HIGH (ref 6–23)
Chloride: 102 mEq/L (ref 96–112)
Creatinine, Ser: 1.7 mg/dL — ABNORMAL HIGH (ref 0.4–1.2)
GFR: 33.48 mL/min — ABNORMAL LOW (ref >60.00)

## 2012-04-22 LAB — URINALYSIS, ROUTINE W REFLEX MICROSCOPIC
Bilirubin Urine: NEGATIVE
Hgb urine dipstick: NEGATIVE
Total Protein, Urine: NEGATIVE
Urine Glucose: NEGATIVE

## 2012-04-22 LAB — CBC WITH DIFFERENTIAL/PLATELET
Basophils Relative: 0.8 % (ref 0.0–3.0)
Eosinophils Relative: 1.3 % (ref 0.0–5.0)
Lymphocytes Relative: 16.2 % (ref 12.0–46.0)
Neutrophils Relative %: 76.4 % (ref 43.0–77.0)
RBC: 4.44 Mil/uL (ref 3.87–5.11)
WBC: 12.8 10*3/uL — ABNORMAL HIGH (ref 4.5–10.5)

## 2012-04-23 ENCOUNTER — Encounter: Payer: Self-pay | Admitting: Internal Medicine

## 2012-04-23 ENCOUNTER — Ambulatory Visit (INDEPENDENT_AMBULATORY_CARE_PROVIDER_SITE_OTHER): Payer: BC Managed Care – PPO | Admitting: Internal Medicine

## 2012-04-23 VITALS — BP 130/80 | HR 96 | Temp 98.9°F | Ht 72.0 in | Wt 344.2 lb

## 2012-04-23 DIAGNOSIS — D649 Anemia, unspecified: Secondary | ICD-10-CM | POA: Insufficient documentation

## 2012-04-23 DIAGNOSIS — I5032 Chronic diastolic (congestive) heart failure: Secondary | ICD-10-CM

## 2012-04-23 DIAGNOSIS — E785 Hyperlipidemia, unspecified: Secondary | ICD-10-CM

## 2012-04-23 DIAGNOSIS — Z0000 Encounter for general adult medical examination without abnormal findings: Secondary | ICD-10-CM

## 2012-04-23 DIAGNOSIS — G629 Polyneuropathy, unspecified: Secondary | ICD-10-CM

## 2012-04-23 DIAGNOSIS — N289 Disorder of kidney and ureter, unspecified: Secondary | ICD-10-CM

## 2012-04-23 DIAGNOSIS — G609 Hereditary and idiopathic neuropathy, unspecified: Secondary | ICD-10-CM

## 2012-04-23 HISTORY — DX: Hyperlipidemia, unspecified: E78.5

## 2012-04-23 MED ORDER — ATORVASTATIN CALCIUM 10 MG PO TABS: 10.0000 mg | ORAL_TABLET | Freq: Every day | ORAL | Status: DC

## 2012-04-23 MED ORDER — GABAPENTIN 300 MG PO CAPS: 300.0000 mg | ORAL_CAPSULE | Freq: Three times a day (TID) | ORAL | Status: DC

## 2012-04-23 NOTE — Assessment & Plan Note (Addendum)
New onset vs spurious value? , on xarelto, no overt blood loss, for f/u cbc, iron in 3 wks Lab Results  Component Value Date   WBC 12.8* 04/22/2012   HGB 11.9* 04/22/2012   HCT 37.0 04/22/2012   MCV 83.3 04/22/2012   PLT 304.0 04/22/2012

## 2012-04-23 NOTE — Patient Instructions (Addendum)
OK to stop the Cymbalta as you have Take all new medications as prescribed - the gabapentin  - please start at one pill per night for 3 nights, then twice per day for 3 days, then three time per day after that, for the neuropathy pain;  Also please start the lipitor 10 mg per day Continue all other medications as before Please have the pharmacy call with any other refills you may need. Please go to LAB in the Basement for the blood and/or urine tests to be done in 3 weeks You will be contacted by phone if any changes need to be made immediately.  Otherwise, you will receive a letter about your results with an explanation, but please check with MyChart first. Please remember to sign up for My Chart at your earliest convenience, as this will be important to you in the future with finding out test results. You will be contacted regarding the referral for: Dr Teressa Lower Please return in 6 months, or sooner if needed

## 2012-04-23 NOTE — Assessment & Plan Note (Addendum)
Missed summer f/u with Dr Teressa Lower - will refer back, cont current meds

## 2012-04-24 ENCOUNTER — Encounter: Payer: Self-pay | Admitting: Internal Medicine

## 2012-04-24 NOTE — Progress Notes (Signed)
Subjective:    Patient ID: Pamela [name], female    DOB: Jan 30, 1956, 56 y.o.   MRN: [id number]  HPI  Here for wellness and f/u;  Overall doing ok;  Pt denies CP, worsening SOB, DOE, wheezing, orthopnea, PND, worsening LE edema, palpitations, dizziness or syncope.  Pt denies neurological change such as new Headache, facial or extremity weakness.  Pt denies polydipsia, polyuria, or low sugar symptoms. Pt states overall good compliance with treatment and medications, good tolerability, and trying to follow lower cholesterol diet.  Pt denies worsening depressive symptoms, suicidal ideation or panic. No fever, wt loss, night sweats, loss of appetite, or other constitutional symptoms.  Pt states good ability with ADL's, low fall risk, home safety reviewed and adequate, no significant changes in hearing or vision, and occasionally active with exercise.  Unfortunatly had "loopiness" with cymbalta and had to stop after one wk though did help with her neuropathic pain.  Unfortunately did miss a summer 2013 f/u with cardiology/Dr Bensimohn. No overt bleeding or bruising, on xarelto; labs just prior to this visit with slight low hgb, increased cr.  Is willing to start statin Past Medical History  Diagnosis Date  . DIABETES MELLITUS, TYPE II 12/31/2007  . ANXIETY 12/31/2007  . DEPRESSION 12/31/2007  . HYPERTENSION 12/31/2007  . ALLERGIC RHINITIS 12/31/2007  . Right knee DJD   . Rosacea 10/11/2010  . Diastolic dysfunction 10/11/2010  . Atrial fibrillation   . Migraine   . PNA (pneumonia)     in her 3s  . Hyperlipidemia 04/23/2012   Past Surgical History  Procedure Date  . 2 d&c     1980s    reports that she has never smoked. She does not have any smokeless tobacco history on file. She reports that she drinks alcohol. Her drug history not on file. family history includes Arthritis in her other; Cancer in her other; Diabetes in her other; Heart disease in her other; Mental illness in her other; and Stroke in her  other. Allergies  Allergen Reactions  . Cymbalta (Duloxetine Hcl)     "loopiness"   Current Outpatient Prescriptions on File Prior to Visit  Medication Sig Dispense Refill  . diltiazem (CARDIZEM CD) 360 MG 24 hr capsule Take 1 capsule (360 mg total) by mouth daily.  30 capsule  6  . furosemide (LASIX) 20 MG tablet Take 2 tablets (40 mg total) by mouth 2 (two) times daily.  120 tablet  6  . glimepiride (AMARYL) 2 MG tablet Take 1 tablet (2 mg total) by mouth daily before breakfast.  180 tablet  0  . lisinopril (PRINIVIL,ZESTRIL) 10 MG tablet TAKE 1 TABLET BY MOUTH DAILY  30 tablet  5  . metFORMIN (GLUCOPHAGE) 500 MG tablet TAKE 1 TABLET BY MOUTH TWICE DAILY  60 tablet  8  . metoprolol tartrate (LOPRESSOR) 25 MG tablet Take 1 tablet (25 mg total) by mouth 2 (two) times daily.  60 tablet  4  . potassium chloride SA (K-DUR,KLOR-CON) 20 MEQ tablet Take 1 tablet (20 mEq total) by mouth daily.  30 tablet  6  . Rivaroxaban (XARELTO) 20 MG TABS Take 1 tablet (20 mg total) by mouth daily.  30 tablet  6  . spironolactone (ALDACTONE) 25 MG tablet TAKE 1 TABLET BY MOUTH DAILY  30 tablet  2  . atorvastatin (LIPITOR) 10 MG tablet Take 1 tablet (10 mg total) by mouth daily.  90 tablet  3  . gabapentin (NEURONTIN) 300 MG capsule Take 1 capsule (300  mg total) by mouth 3 (three) times daily.  90 capsule  5   Review of Systems Review of Systems  Constitutional: Negative for diaphoresis, activity change, appetite change and unexpected weight change.  HENT: Negative for hearing loss, ear pain, facial swelling, mouth sores and neck stiffness.   Eyes: Negative for pain, redness and visual disturbance.  Respiratory: Negative for shortness of breath and wheezing.   Cardiovascular: Negative for chest pain and palpitations.  Gastrointestinal: Negative for diarrhea, blood in stool, abdominal distention and rectal pain.  Genitourinary: Negative for hematuria, flank pain and decreased urine volume.  Musculoskeletal:  Negative for myalgias and joint swelling.  Skin: Negative for color change and wound.  Neurological: Negative for syncope Hematological: Negative for adenopathy.  Psychiatric/Behavioral: Negative for hallucinations, self-injury, decreased concentration and agitation.     Objective:   Physical Exam BP 130/80  Pulse 96  Temp 98.9 F (37.2 C) (Oral)  Ht 6' (1.829 m)  Wt 344 lb 4 oz (156.151 kg)  BMI 46.69 kg/m2  SpO2 96% Physical Exam  VS noted, not ill appearing Constitutional: Pt is oriented to person, place, and time. Appears well-developed and well-nourished./morbid obese  Head: Normocephalic and atraumatic.  Right Ear: External ear normal.  Left Ear: External ear normal.  Nose: Nose normal.  Mouth/Throat: Oropharynx is clear and moist.  Eyes: Conjunctivae and EOM are normal. Pupils are equal, round, and reactive to light.  Neck: Normal range of motion. Neck supple. No JVD present. No tracheal deviation present.  Cardiovascular: Normal rate, regular rhythm, normal heart sounds and intact distal pulses.   Pulmonary/Chest: Effort normal and breath sounds normal.  Abdominal: Soft. Bowel sounds are normal. There is no tenderness.  Musculoskeletal: Normal range of motion. Exhibits no edema.  Lymphadenopathy:  Has no cervical adenopathy.  Neurological: Pt is alert and oriented to person, place, and time. Pt has normal reflexes. No cranial nerve deficit. Motor/gait intact Skin: Skin is warm and dry. No rash noted.  No LE edema  Psychiatric:  Has  normal mood and affect. Behavior is normal. except mild nervous    Assessment & Plan:

## 2012-04-24 NOTE — Assessment & Plan Note (Signed)
Unable to tolerate cymbalta which we had hoped would help with other pain as well, to stop this, and try gabapentin asd

## 2012-04-24 NOTE — Assessment & Plan Note (Signed)
Mild to mod, for lipitor 10,  to f/u any worsening symptoms or concerns, for f/u labs in 3 wks

## 2012-04-24 NOTE — Assessment & Plan Note (Signed)

## 2012-04-24 NOTE — Assessment & Plan Note (Signed)
For f/u 3 wks with other labs

## 2012-04-28 ENCOUNTER — Encounter (INDEPENDENT_AMBULATORY_CARE_PROVIDER_SITE_OTHER): Payer: BC Managed Care – PPO | Admitting: Ophthalmology

## 2012-05-01 ENCOUNTER — Other Ambulatory Visit: Payer: Self-pay | Admitting: Internal Medicine

## 2012-05-14 ENCOUNTER — Encounter (INDEPENDENT_AMBULATORY_CARE_PROVIDER_SITE_OTHER): Payer: BC Managed Care – PPO | Admitting: Ophthalmology

## 2012-05-15 ENCOUNTER — Ambulatory Visit (INDEPENDENT_AMBULATORY_CARE_PROVIDER_SITE_OTHER): Payer: BC Managed Care – PPO | Admitting: Family Medicine

## 2012-05-15 ENCOUNTER — Encounter: Payer: Self-pay | Admitting: Family Medicine

## 2012-05-15 VITALS — BP 118/64 | HR 96 | Temp 97.5°F | Wt 352.8 lb

## 2012-05-15 DIAGNOSIS — J40 Bronchitis, not specified as acute or chronic: Secondary | ICD-10-CM

## 2012-05-15 MED ORDER — AZITHROMYCIN 250 MG PO TABS: ORAL_TABLET | ORAL | Status: DC

## 2012-05-15 NOTE — Progress Notes (Addendum)
  Subjective:    Patient ID: Pamela [name], female    DOB: 02-20-1956, 56 y.o.   MRN: [id number]  HPI CC: cough  Pleasant 56 yo with h/o Afib, DM, h/o PNA and bronchitis in past presents with 1 wk h/o dry cough occasionally productive of white/clear mucous.  + hoarse voice as well.  Notices pain between shoulder blades with cough.  Some incontinence episodes with coughing.  Also with coryza, HA, PNdrainage.  Tried ricola cough drops.  No fevers/chills, ST, ear or tooth pain.  No smokers at home. No h/o asthma/COPD. No sick contacts at home.  Past Medical History  Diagnosis Date  . DIABETES MELLITUS, TYPE II 12/31/2007  . ANXIETY 12/31/2007  . DEPRESSION 12/31/2007  . HYPERTENSION 12/31/2007  . ALLERGIC RHINITIS 12/31/2007  . Right knee DJD   . Rosacea 10/11/2010  . Diastolic dysfunction 10/11/2010  . Atrial fibrillation   . Migraine   . PNA (pneumonia)     in her 40s  . Hyperlipidemia 04/23/2012     Review of Systems Per HPI    Objective:   Physical Exam  Nursing note and vitals reviewed. Constitutional: She appears well-developed and well-nourished. No distress.  HENT:  Head: Normocephalic and atraumatic.  Right Ear: Hearing, tympanic membrane, external ear and ear canal normal.  Left Ear: Hearing, tympanic membrane, external ear and ear canal normal.  Nose: No mucosal edema or rhinorrhea. Right sinus exhibits no maxillary sinus tenderness and no frontal sinus tenderness. Left sinus exhibits no maxillary sinus tenderness and no frontal sinus tenderness.  Mouth/Throat: Uvula is midline, oropharynx is clear and moist and mucous membranes are normal. No oropharyngeal exudate, posterior oropharyngeal edema, posterior oropharyngeal erythema or tonsillar abscesses.  Eyes: Conjunctivae normal and EOM are normal. Pupils are equal, round, and reactive to light. No scleral icterus.  Neck: Normal range of motion. Neck supple.  Cardiovascular: Normal rate, normal heart sounds and intact  distal pulses.  An irregularly irregular rhythm present.  No murmur heard.      Chronic afib  Pulmonary/Chest: Effort normal and breath sounds normal. No respiratory distress. She has no wheezes. She has no rales.       Dry cough present  Lymphadenopathy:    She has no cervical adenopathy.  Skin: Skin is warm and dry. No rash noted.      Assessment & Plan:

## 2012-05-15 NOTE — Patient Instructions (Signed)
Sounds like are developing a bronchitis Use medication as prescribed: zpack sent to pharmacy.  cheratussin script provided - watch out as it can make you sleepy. Push fluids and plenty of rest. May try simple mucinex with plenty of fluid. Please return if you are not improving as expected, or if you have high fevers (>101.5) or difficulty swallowing or worsening productive cough. Call clinic with questions.  Good to see you today.

## 2012-05-15 NOTE — Assessment & Plan Note (Signed)
Given duration and h/o PNA in past, will treat aggressively with zpack and cheratussin per pt preference. See pt instructions for plan.

## 2012-06-10 ENCOUNTER — Encounter (INDEPENDENT_AMBULATORY_CARE_PROVIDER_SITE_OTHER): Payer: BC Managed Care – PPO | Admitting: Ophthalmology

## 2012-06-12 ENCOUNTER — Other Ambulatory Visit: Payer: Self-pay | Admitting: Internal Medicine

## 2012-07-19 ENCOUNTER — Other Ambulatory Visit (HOSPITAL_COMMUNITY): Payer: Self-pay | Admitting: Internal Medicine

## 2012-08-24 ENCOUNTER — Other Ambulatory Visit (HOSPITAL_COMMUNITY): Payer: Self-pay | Admitting: Internal Medicine

## 2012-09-07 ENCOUNTER — Other Ambulatory Visit (HOSPITAL_COMMUNITY): Payer: Self-pay | Admitting: Internal Medicine

## 2012-09-10 ENCOUNTER — Telehealth (HOSPITAL_COMMUNITY): Payer: Self-pay | Admitting: Cardiology

## 2012-09-10 ENCOUNTER — Encounter (HOSPITAL_COMMUNITY): Payer: Self-pay | Admitting: Cardiology

## 2012-09-10 NOTE — Telephone Encounter (Signed)
Number d/c I have been unable to reach this patient by phone.  A letter is being sent to the last known home address.

## 2012-09-10 NOTE — Telephone Encounter (Signed)
Message copied by Marquett Bertoli, Milagros Reap on Fri Sep 10, 2012  4:34 PM ------      Message from: Noralee Space      Created: Wed Aug 25, 2012  9:37 AM       Hey can make sure she gets an appt we haven't seen her since April of last year, thanks ------

## 2012-09-15 ENCOUNTER — Telehealth: Payer: Self-pay

## 2012-09-15 ENCOUNTER — Other Ambulatory Visit (HOSPITAL_COMMUNITY): Payer: Self-pay | Admitting: Internal Medicine

## 2012-09-15 NOTE — Telephone Encounter (Signed)
error 

## 2012-09-17 ENCOUNTER — Other Ambulatory Visit (HOSPITAL_COMMUNITY): Payer: Self-pay | Admitting: *Deleted

## 2012-09-17 MED ORDER — METOPROLOL TARTRATE 25 MG PO TABS: 25.0000 mg | ORAL_TABLET | Freq: Two times a day (BID) | ORAL | Status: DC

## 2012-10-14 ENCOUNTER — Other Ambulatory Visit: Payer: Self-pay | Admitting: *Deleted

## 2012-10-19 ENCOUNTER — Other Ambulatory Visit (HOSPITAL_COMMUNITY): Payer: Self-pay | Admitting: *Deleted

## 2012-10-19 ENCOUNTER — Other Ambulatory Visit: Payer: Self-pay | Admitting: *Deleted

## 2012-10-19 ENCOUNTER — Telehealth: Payer: Self-pay

## 2012-10-19 MED ORDER — METOPROLOL TARTRATE 25 MG PO TABS: 25.0000 mg | ORAL_TABLET | Freq: Two times a day (BID) | ORAL | Status: DC

## 2012-10-19 MED ORDER — FUROSEMIDE 20 MG PO TABS: 40.0000 mg | ORAL_TABLET | Freq: Every day | ORAL | Status: DC

## 2012-10-19 MED ORDER — POTASSIUM CHLORIDE CRYS ER 20 MEQ PO TBCR: 20.0000 meq | EXTENDED_RELEASE_TABLET | Freq: Every day | ORAL | Status: DC

## 2012-10-19 NOTE — Telephone Encounter (Signed)
It appaars she used to take this at twice per day previously, I would have to investigate why this was changed to know for sure why, but for now would continue at 1 in the am only for now, as she is due for OV and we can check the labs  Needs ov if not already scheduled

## 2012-10-19 NOTE — Telephone Encounter (Signed)
Called unable to get through with work number and home number not working

## 2012-10-19 NOTE — Telephone Encounter (Signed)
Phone call from pt stating she needs a refill on her Glimepiride 2 mg as she is out of this medication. She has concerns about the directions. The pharmacy has down to take 1 tablet daily before breakfast but she believes it should be 1 tablet in the morning and 1 tablet in the evening. Please advise on directions.

## 2012-10-20 MED ORDER — GLIMEPIRIDE 2 MG PO TABS: 2.0000 mg | ORAL_TABLET | Freq: Every day | ORAL | Status: DC

## 2012-10-20 NOTE — Telephone Encounter (Signed)
Have called the patients home number and does not work and unable to get through with work number.

## 2012-10-20 NOTE — Telephone Encounter (Signed)
Called the patient did inform of MD instructions on medication and she  Will call back to schedule appt.

## 2012-10-22 ENCOUNTER — Ambulatory Visit: Payer: BC Managed Care – PPO | Admitting: Internal Medicine

## 2012-10-22 DIAGNOSIS — Z0289 Encounter for other administrative examinations: Secondary | ICD-10-CM

## 2012-11-16 ENCOUNTER — Other Ambulatory Visit: Payer: Self-pay | Admitting: Internal Medicine

## 2012-11-17 ENCOUNTER — Other Ambulatory Visit: Payer: Self-pay

## 2012-11-17 MED ORDER — DILTIAZEM HCL ER COATED BEADS 360 MG PO CP24: ORAL_CAPSULE | ORAL | Status: DC

## 2012-11-29 ENCOUNTER — Encounter: Payer: Self-pay | Admitting: Internal Medicine

## 2012-11-29 ENCOUNTER — Encounter (HOSPITAL_COMMUNITY): Payer: Self-pay

## 2012-11-29 ENCOUNTER — Ambulatory Visit (HOSPITAL_COMMUNITY)
Admission: RE | Admit: 2012-11-29 | Discharge: 2012-11-29 | Disposition: A | Discharge status: Home / Self Care | Payer: BC Managed Care – PPO | Source: Ambulatory Visit | Attending: Internal Medicine | Admitting: Internal Medicine

## 2012-11-29 ENCOUNTER — Ambulatory Visit (INDEPENDENT_AMBULATORY_CARE_PROVIDER_SITE_OTHER): Payer: BC Managed Care – PPO | Admitting: Internal Medicine

## 2012-11-29 ENCOUNTER — Other Ambulatory Visit (INDEPENDENT_AMBULATORY_CARE_PROVIDER_SITE_OTHER): Payer: BC Managed Care – PPO

## 2012-11-29 VITALS — BP 120/70 | HR 95 | Wt 336.0 lb

## 2012-11-29 VITALS — BP 120/82 | HR 83 | Temp 97.5°F | Ht 72.0 in | Wt 340.0 lb

## 2012-11-29 DIAGNOSIS — E119 Type 2 diabetes mellitus without complications: Secondary | ICD-10-CM

## 2012-11-29 DIAGNOSIS — L719 Rosacea, unspecified: Secondary | ICD-10-CM | POA: Insufficient documentation

## 2012-11-29 DIAGNOSIS — E785 Hyperlipidemia, unspecified: Secondary | ICD-10-CM | POA: Insufficient documentation

## 2012-11-29 DIAGNOSIS — G629 Polyneuropathy, unspecified: Secondary | ICD-10-CM

## 2012-11-29 DIAGNOSIS — Z7901 Long term (current) use of anticoagulants: Secondary | ICD-10-CM | POA: Insufficient documentation

## 2012-11-29 DIAGNOSIS — I10 Essential (primary) hypertension: Secondary | ICD-10-CM | POA: Insufficient documentation

## 2012-11-29 DIAGNOSIS — J309 Allergic rhinitis, unspecified: Secondary | ICD-10-CM | POA: Insufficient documentation

## 2012-11-29 DIAGNOSIS — M79642 Pain in left hand: Secondary | ICD-10-CM | POA: Insufficient documentation

## 2012-11-29 DIAGNOSIS — F329 Major depressive disorder, single episode, unspecified: Secondary | ICD-10-CM

## 2012-11-29 DIAGNOSIS — M1710 Unilateral primary osteoarthritis, unspecified knee: Secondary | ICD-10-CM | POA: Insufficient documentation

## 2012-11-29 DIAGNOSIS — E669 Obesity, unspecified: Secondary | ICD-10-CM | POA: Insufficient documentation

## 2012-11-29 DIAGNOSIS — M79641 Pain in right hand: Secondary | ICD-10-CM

## 2012-11-29 DIAGNOSIS — IMO0002 Reserved for concepts with insufficient information to code with codable children: Secondary | ICD-10-CM | POA: Insufficient documentation

## 2012-11-29 DIAGNOSIS — N289 Disorder of kidney and ureter, unspecified: Secondary | ICD-10-CM

## 2012-11-29 DIAGNOSIS — F411 Generalized anxiety disorder: Secondary | ICD-10-CM | POA: Insufficient documentation

## 2012-11-29 DIAGNOSIS — M79609 Pain in unspecified limb: Secondary | ICD-10-CM

## 2012-11-29 DIAGNOSIS — I5032 Chronic diastolic (congestive) heart failure: Secondary | ICD-10-CM | POA: Insufficient documentation

## 2012-11-29 DIAGNOSIS — I4891 Unspecified atrial fibrillation: Secondary | ICD-10-CM | POA: Insufficient documentation

## 2012-11-29 DIAGNOSIS — G609 Hereditary and idiopathic neuropathy, unspecified: Secondary | ICD-10-CM

## 2012-11-29 DIAGNOSIS — D649 Anemia, unspecified: Secondary | ICD-10-CM

## 2012-11-29 DIAGNOSIS — Z79899 Other long term (current) drug therapy: Secondary | ICD-10-CM | POA: Insufficient documentation

## 2012-11-29 DIAGNOSIS — F3289 Other specified depressive episodes: Secondary | ICD-10-CM | POA: Insufficient documentation

## 2012-11-29 LAB — CBC WITH DIFFERENTIAL/PLATELET
Eosinophils Relative: 2.2 % (ref 0.0–5.0)
Lymphocytes Relative: 15.2 % (ref 12.0–46.0)
MCV: 81.7 fl (ref 78.0–100.0)
Monocytes Absolute: 0.6 10*3/uL (ref 0.1–1.0)
Monocytes Relative: 5.2 % (ref 3.0–12.0)
Neutrophils Relative %: 76.9 % (ref 43.0–77.0)
Platelets: 292 10*3/uL (ref 150.0–400.0)
RBC: 4.88 Mil/uL (ref 3.87–5.11)
WBC: 10.7 10*3/uL — ABNORMAL HIGH (ref 4.5–10.5)

## 2012-11-29 LAB — IBC PANEL
Iron: 50 ug/dL (ref 42–145)
Saturation Ratios: 12.8 % — ABNORMAL LOW (ref 20.0–50.0)
Transferrin: 280 mg/dL (ref 212.0–360.0)

## 2012-11-29 LAB — HEPATIC FUNCTION PANEL
ALT: 16 U/L (ref 0–35)
AST: 14 U/L (ref 0–37)
Albumin: 3.9 g/dL (ref 3.5–5.2)
Total Protein: 7.8 g/dL (ref 6.0–8.3)

## 2012-11-29 LAB — BASIC METABOLIC PANEL
BUN: 14 mg/dL (ref 6–23)
CO2: 28 mEq/L (ref 19–32)
Chloride: 102 mEq/L (ref 96–112)
Creatinine, Ser: 1.3 mg/dL — ABNORMAL HIGH (ref 0.4–1.2)
Glucose, Bld: 194 mg/dL — ABNORMAL HIGH (ref 70–99)
Potassium: 4.4 mEq/L (ref 3.5–5.1)

## 2012-11-29 LAB — URINALYSIS, ROUTINE W REFLEX MICROSCOPIC
Bilirubin Urine: NEGATIVE
Leukocytes, UA: NEGATIVE
Nitrite: NEGATIVE
WBC, UA: NONE SEEN (ref 0–?)
pH: 6 (ref 5.0–8.0)

## 2012-11-29 LAB — LIPID PANEL
Cholesterol: 148 mg/dL (ref 0–200)
Triglycerides: 88 mg/dL (ref 0.0–149.0)

## 2012-11-29 LAB — HEMOGLOBIN A1C: Hgb A1c MFr Bld: 7.8 % — ABNORMAL HIGH (ref 4.6–6.5)

## 2012-11-29 MED ORDER — POTASSIUM CHLORIDE CRYS ER 20 MEQ PO TBCR: 20.0000 meq | EXTENDED_RELEASE_TABLET | Freq: Every day | ORAL | Status: DC

## 2012-11-29 NOTE — Patient Instructions (Signed)
OK to stay off the gabapentin as you have Please continue all other medications as before, and refills have been done if requested. Please have the pharmacy call with any other refills you may need.,j Please go to the LAB in the Basement (turn left off the elevator) for the tests to be done today You will be contacted by phone if any changes need to be made immediately.  Otherwise, you will receive a letter about your results with an explanation, but please check with MyChart first.  .Please keep your appointments with your specialists as you have planned  Please remember to sign up for My Chart if you have not done so, as this will be important to you in the future with finding out test results, communicating by private email, and scheduling acute appointments online when needed.  Please return in 6 months, or sooner if needed

## 2012-11-29 NOTE — Assessment & Plan Note (Signed)
Did not tolerate gabapentin, ok to hold further tx for now

## 2012-11-29 NOTE — Assessment & Plan Note (Signed)
stable overall by history and exam, recent data reviewed with pt, and pt to continue medical treatment as before,  to f/u any worsening symptoms or concerns Lab Results  Component Value Date   LDLCALC 102* 04/15/2011   Consider incr statin LDL > 100

## 2012-11-29 NOTE — Assessment & Plan Note (Signed)
Minor last visit, ? Clinical singificance, ? Related to CKD? - for f/u labs today, with iron/b12/cbc

## 2012-11-29 NOTE — Assessment & Plan Note (Signed)
Rate controlled. Continue current regimen and Xarelto daily. Pamela [name]

## 2012-11-29 NOTE — Assessment & Plan Note (Signed)
stable overall by history and exam, recent data reviewed with pt, and pt to continue medical treatment as before,  to f/u any worsening symptoms or concerns Lab Results  Component Value Date   HGBA1C 6.5 04/22/2012

## 2012-11-29 NOTE — Patient Instructions (Addendum)
Follow up in 3 months with an ECHO  Do the following things EVERYDAY: 1) Weigh yourself in the morning before breakfast. Write it down and keep it in a log. 2) Take your medicines as prescribed 3) Eat low salt foods-Limit salt (sodium) to 2000 mg per day.  4) Stay as active as you can everyday 5) Limit all fluids for the day to less than 2 liters 

## 2012-11-29 NOTE — Assessment & Plan Note (Signed)
stable overall by history and exam, recent data reviewed with pt, and pt to continue medical treatment as before,  to f/u any worsening symptoms or concerns BP Readings from Last 3 Encounters:  11/29/12 120/82  05/15/12 118/64  04/23/12 130/80

## 2012-11-29 NOTE — Assessment & Plan Note (Addendum)
?   Mild worsening last visit, for f/u today  Note:  Total time for pt hx, exam, review of record with pt in the room, determination of diagnoses and plan for further eval and tx is > 40 min, with over 50% spent in coordination and counseling of patient

## 2012-11-29 NOTE — Progress Notes (Signed)
Patient ID: Pamela [name], female   DOB: [birth year], 57 y.o.   MRN: [id number] HPI: Pamela [name] is a 57 y/o with h/o DM2, obesity, HTN, Afib - Xarelto and chronic  diastolic HF 09/2010 EF 55% .   Underwent cardiac cath in April 2012 which showed normal coronaries. Did very well with rate control and diuresis. Started on coumadin. We saw her in May and switched her to Xarelto due to problems regulating INRs. She underwent DC-CV in June which failed so we opted for rate control strategy.  She returns for follow up. Last office visit was over a year ago and at that time spironolactone was to be cut back to 12.5 mg daily however she continued on 25 mg daily. Evaluated by Dr [name] this am he obtained lab work. Says she is depressed regarding the recent death of her mother. Denies SOB/PND/Orthopnea/CP. She does admit to dyspnea going up steps.  She does not weigh daily.Chronic lower extremity edema.  Complaint with medications.   11/29/12 Creatinine 1.3 Potassium 4.4  ROS: All systems negative except as listed in HPI, PMH and Problem List.  Past Medical History  Diagnosis Date  . DIABETES MELLITUS, TYPE II 12/31/2007  . ANXIETY 12/31/2007  . DEPRESSION 12/31/2007  . HYPERTENSION 12/31/2007  . ALLERGIC RHINITIS 12/31/2007  . Right knee DJD   . Rosacea 10/11/2010  . Diastolic dysfunction 10/11/2010  . Atrial fibrillation   . Migraine   . PNA (pneumonia)     in her 44s  . Hyperlipidemia 04/23/2012    Current Outpatient Prescriptions  Medication Sig Dispense Refill  . atorvastatin (LIPITOR) 10 MG tablet Take 1 tablet (10 mg total) by mouth daily.  90 tablet  3  . diltiazem (CARDIZEM CD) 360 MG 24 hr capsule TAKE ONE CAPSULE BY MOUTH DAILY  30 capsule  0  . furosemide (LASIX) 20 MG tablet Take 2 tablets (40 mg total) by mouth daily.  60 tablet  6  . glimepiride (AMARYL) 2 MG tablet Take 1 tablet (2 mg total) by mouth daily before breakfast.  180 tablet  0  . guaiFENesin-codeine (ROBITUSSIN AC) 100-10 MG/5ML  syrup Take 5 mLs by mouth 2 (two) times daily as needed for cough (sedation precautions).  180 mL  0  . lisinopril (PRINIVIL,ZESTRIL) 10 MG tablet TAKE 1 TABLET BY MOUTH DAILY  30 tablet  5  . metFORMIN (GLUCOPHAGE) 500 MG tablet TAKE 1 TABLET BY MOUTH TWICE DAILY  60 tablet  11  . metoprolol tartrate (LOPRESSOR) 25 MG tablet TAKE 1 TABLET BY MOUTH TWICE DAILY  60 tablet  1  . potassium chloride SA (K-DUR,KLOR-CON) 20 MEQ tablet Take 1 tablet (20 mEq total) by mouth daily.  15 tablet  0  . spironolactone (ALDACTONE) 25 MG tablet TAKE 1 TABLET BY MOUTH DAILY  30 tablet  5  . XARELTO 20 MG TABS TAKE 1 TABLET BY MOUTH DAILY  30 tablet  3   No current facility-administered medications for this encounter.     PHYSICAL EXAM: Filed Vitals:   11/29/12 1449  BP: 120/70  Pulse: 95  Weight: 336 lb (152.409 kg)  SpO2: 98%   General: Obese  Well appearing. No resp difficulty HEENT: normal Neck: supple. JVP hard to see   Carotids 2+ bilaterally; no bruits. No lymphadenopathy or thryomegaly appreciated. Cor: PMI normal. Irregular rate & rhythm. No rubs, gallops or murmurs. Lungs: clear Abdomen: obese soft, nontender, nondistended.  Good bowel sounds. Extremities: no cyanosis, clubbing, rash, edema Neuro: alert &  orientedx3, cranial nerves grossly intact. Moves all 4 extremities w/o difficulty. Affect pleasant.  EKG: AFib 87 bpm   ASSESSMENT & PLAN:

## 2012-11-29 NOTE — Assessment & Plan Note (Addendum)
Volume status stable. Reviewed BMET from today. Potassium and creatinine ok. Continue current diuretic regimen. Will continue spironolactone 25 mg and lasix 40 mg daily. Encouraged to walk daily and weight and reocrd daily.  Also reinforced low salt food choices. Follow up in 3-4 months.

## 2012-11-29 NOTE — Assessment & Plan Note (Signed)
Subjective per pt with feet pain, strong FH RA - for labs today, no active synovitis

## 2012-11-29 NOTE — Progress Notes (Signed)
Subjective:    Patient ID: Pamela [name], female    DOB: 1956-02-09, 57 y.o.   MRN: [id number]  HPI  Here to f/u; overall doing ok,  Pt denies chest pain, increased sob or doe, wheezing, orthopnea, PND, increased LE swelling, palpitations, dizziness or syncope.  Pt denies polydipsia, polyuria, or low sugar symptoms such as weakness or confusion improved with po intake.  Pt denies new neurological symptoms such as new headache, or facial or extremity weakness or numbness.   Pt states overall good compliance with meds, has been trying to follow lower cholesterol diet, with wt overall stable,  but little exercise however.  Had to stop gabapentin due to itching, can put up with feet tingling for now.  Mother and sister with RA, pt c/o bilat hand pain and feet pain though no swelling, asks for eval.  To see Dr Bensimohn/card later today Past Medical History  Diagnosis Date  . DIABETES MELLITUS, TYPE II 12/31/2007  . ANXIETY 12/31/2007  . DEPRESSION 12/31/2007  . HYPERTENSION 12/31/2007  . ALLERGIC RHINITIS 12/31/2007  . Right knee DJD   . Rosacea 10/11/2010  . Diastolic dysfunction 10/11/2010  . Atrial fibrillation   . Migraine   . PNA (pneumonia)     in her 56s  . Hyperlipidemia 04/23/2012   Past Surgical History  Procedure Laterality Date  . 2 d&c      1980s    reports that she has never smoked. She does not have any smokeless tobacco history on file. She reports that  drinks alcohol. She reports that she does not use illicit drugs. family history includes Arthritis in her other; Cancer in her other; Diabetes in her other; Heart disease in her other; Mental illness in her other; and Stroke in her other. Allergies  Allergen Reactions  . Cymbalta (Duloxetine Hcl)     "loopiness"  . Gabapentin Itching   Current Outpatient Prescriptions on File Prior to Visit  Medication Sig Dispense Refill  . atorvastatin (LIPITOR) 10 MG tablet Take 1 tablet (10 mg total) by mouth daily.  90 tablet  3  .  diltiazem (CARDIZEM CD) 360 MG 24 hr capsule TAKE ONE CAPSULE BY MOUTH DAILY  30 capsule  0  . furosemide (LASIX) 20 MG tablet Take 2 tablets (40 mg total) by mouth daily.  60 tablet  6  . glimepiride (AMARYL) 2 MG tablet Take 1 tablet (2 mg total) by mouth daily before breakfast.  180 tablet  0  . guaiFENesin-codeine (ROBITUSSIN AC) 100-10 MG/5ML syrup Take 5 mLs by mouth 2 (two) times daily as needed for cough (sedation precautions).  180 mL  0  . lisinopril (PRINIVIL,ZESTRIL) 10 MG tablet TAKE 1 TABLET BY MOUTH DAILY  30 tablet  5  . metFORMIN (GLUCOPHAGE) 500 MG tablet TAKE 1 TABLET BY MOUTH TWICE DAILY  60 tablet  11  . metoprolol tartrate (LOPRESSOR) 25 MG tablet TAKE 1 TABLET BY MOUTH TWICE DAILY  60 tablet  1  . potassium chloride SA (K-DUR,KLOR-CON) 20 MEQ tablet Take 1 tablet (20 mEq total) by mouth daily.  15 tablet  0  . spironolactone (ALDACTONE) 25 MG tablet TAKE 1 TABLET BY MOUTH DAILY  30 tablet  5  . XARELTO 20 MG TABS TAKE 1 TABLET BY MOUTH DAILY  30 tablet  3   No current facility-administered medications on file prior to visit.   Review of Systems  Constitutional: Negative for unexpected weight change, or unusual diaphoresis  HENT: Negative for tinnitus.  Eyes: Negative for photophobia and visual disturbance.  Respiratory: Negative for choking and stridor.   Gastrointestinal: Negative for vomiting and blood in stool.  Genitourinary: Negative for hematuria and decreased urine volume.  Musculoskeletal: Negative for acute joint swelling Skin: Negative for color change and wound.  Neurological: Negative for tremors and numbness other than noted  Psychiatric/Behavioral: Negative for decreased concentration or  hyperactivity.       Objective:   Physical Exam BP 120/82  Pulse 83  Temp(Src) 97.5 F (36.4 C) (Oral)  Ht 6' (1.829 m)  Wt 340 lb (154.223 kg)  BMI 46.1 kg/m2  SpO2 98% VS noted,  Constitutional: Pt appears well-developed and well-nourished.  HENT: Head:  NCAT.  Right Ear: External ear normal.  Left Ear: External ear normal.  Eyes: Conjunctivae and EOM are normal. Pupils are equal, round, and reactive to light.  Neck: Normal range of motion. Neck supple.  Cardiovascular: Normal rate and regular rhythm.   Pulmonary/Chest: Effort normal and breath sounds normal. - no rales or wheezing  Abd:  Soft, NT, non-distended, + BS Neurological: Pt is alert. Not confused  Skin: Skin is warm. No erythema. No LE edema No synovitis Psychiatric: Pt behavior is normal. Thought content normal.     Assessment & Plan:

## 2012-11-30 ENCOUNTER — Encounter: Payer: Self-pay | Admitting: Internal Medicine

## 2012-11-30 NOTE — Addendum Note (Signed)
Encounter addended by: Ernestina Penna on: 11/30/2012  8:50 AM<BR>     Documentation filed: Charges VN

## 2012-12-22 ENCOUNTER — Other Ambulatory Visit: Payer: Self-pay | Admitting: Internal Medicine

## 2012-12-23 ENCOUNTER — Other Ambulatory Visit (HOSPITAL_COMMUNITY): Payer: Self-pay | Admitting: *Deleted

## 2012-12-23 MED ORDER — DILTIAZEM HCL ER COATED BEADS 360 MG PO CP24: ORAL_CAPSULE | ORAL | Status: DC

## 2012-12-24 ENCOUNTER — Other Ambulatory Visit (HOSPITAL_COMMUNITY): Payer: Self-pay | Admitting: *Deleted

## 2012-12-24 MED ORDER — SPIRONOLACTONE 25 MG PO TABS: ORAL_TABLET | ORAL | Status: DC

## 2012-12-29 ENCOUNTER — Other Ambulatory Visit: Payer: Self-pay | Admitting: *Deleted

## 2012-12-29 MED ORDER — RIVAROXABAN 20 MG PO TABS: 20.0000 mg | ORAL_TABLET | Freq: Every day | ORAL | Status: DC

## 2012-12-29 NOTE — Telephone Encounter (Signed)
Refill sent.

## 2013-01-20 ENCOUNTER — Other Ambulatory Visit: Payer: Self-pay | Admitting: Internal Medicine

## 2013-02-04 ENCOUNTER — Other Ambulatory Visit: Payer: Self-pay | Admitting: *Deleted

## 2013-02-04 MED ORDER — FUROSEMIDE 20 MG PO TABS: 40.0000 mg | ORAL_TABLET | Freq: Every day | ORAL | Status: DC

## 2013-02-24 ENCOUNTER — Encounter (HOSPITAL_COMMUNITY): Payer: Self-pay | Admitting: Cardiology

## 2013-02-24 ENCOUNTER — Telehealth (HOSPITAL_COMMUNITY): Payer: Self-pay | Admitting: Cardiology

## 2013-02-24 NOTE — Telephone Encounter (Signed)
Attempting to contact pt to schedule follow up. Number d/c I have been unable to reach this patient by phone.  A letter is being sent to the last known home address.

## 2013-04-20 ENCOUNTER — Other Ambulatory Visit (HOSPITAL_COMMUNITY): Payer: Self-pay | Admitting: Internal Medicine

## 2013-04-28 ENCOUNTER — Other Ambulatory Visit: Payer: Self-pay | Admitting: Internal Medicine

## 2013-05-12 ENCOUNTER — Ambulatory Visit (INDEPENDENT_AMBULATORY_CARE_PROVIDER_SITE_OTHER): Payer: BC Managed Care – PPO | Admitting: Internal Medicine

## 2013-05-12 ENCOUNTER — Encounter: Payer: Self-pay | Admitting: Internal Medicine

## 2013-05-12 VITALS — BP 132/74 | HR 99 | Temp 97.8°F | Ht 72.0 in | Wt 338.1 lb

## 2013-05-12 DIAGNOSIS — J209 Acute bronchitis, unspecified: Secondary | ICD-10-CM

## 2013-05-12 DIAGNOSIS — I10 Essential (primary) hypertension: Secondary | ICD-10-CM

## 2013-05-12 DIAGNOSIS — E119 Type 2 diabetes mellitus without complications: Secondary | ICD-10-CM

## 2013-05-12 DIAGNOSIS — Z0000 Encounter for general adult medical examination without abnormal findings: Secondary | ICD-10-CM

## 2013-05-12 MED ORDER — HYDROCODONE-HOMATROPINE 5-1.5 MG/5ML PO SYRP: 5.0000 mL | ORAL_SOLUTION | Freq: Four times a day (QID) | ORAL | Status: DC | PRN

## 2013-05-12 MED ORDER — LEVOFLOXACIN 500 MG PO TABS: 500.0000 mg | ORAL_TABLET | Freq: Every day | ORAL | Status: DC

## 2013-05-12 NOTE — Patient Instructions (Addendum)
Please take all new medication as prescribed - the antibiotic, and cough medicine Please continue all other medications as before, and refills have been done if requested. Please have the pharmacy call with any other refills you may need.  Please remember to sign up for My Chart if you have not done so, as this will be important to you in the future with finding out test results, communicating by private email, and scheduling acute appointments online when needed.  Please return in 1 month for your yearly visit, or sooner if needed, with Lab testing done 3-5 days before

## 2013-05-12 NOTE — Assessment & Plan Note (Signed)
Mild to mod, for antibx course,  to f/u any worsening symptoms or concerns 

## 2013-05-12 NOTE — Assessment & Plan Note (Addendum)
stable overall by history and exam, recent data reviewed with pt, and pt to continue medical treatment as before,  to f/u any worsening symptoms or concerns Lab Results  Component Value Date   HGBA1C 7.8* 11/29/2012   Pt to call or rreturn for onset polys or cbg > 200 with current illness

## 2013-05-12 NOTE — Progress Notes (Signed)
Subjective:    Patient ID: Pamela [name], female    DOB: 22-Oct-1955, 57 y.o.   MRN: [id number]  HPI  Here with acute onset mild to mod 2-3 days ST, HA, general weakness and malaise, with prod cough greenish sputum, but Pt denies chest pain, increased sob or doe, wheezing, orthopnea, PND, increased LE swelling, palpitations, dizziness or syncope.  Pt denies new neurological symptoms such as new headache, or facial or extremity weakness or numbness   Pt denies polydipsia, polyuria Past Medical History  Diagnosis Date  . DIABETES MELLITUS, TYPE II 12/31/2007  . ANXIETY 12/31/2007  . DEPRESSION 12/31/2007  . HYPERTENSION 12/31/2007  . ALLERGIC RHINITIS 12/31/2007  . Right knee DJD   . Rosacea 10/11/2010  . Diastolic dysfunction 10/11/2010  . Atrial fibrillation   . Migraine   . PNA (pneumonia)     in her 7s  . Hyperlipidemia 04/23/2012   Past Surgical History  Procedure Laterality Date  . 2 d&c      1980s    reports that she has never smoked. She does not have any smokeless tobacco history on file. She reports that she drinks alcohol. She reports that she does not use illicit drugs. family history includes Arthritis in her other; Cancer in her other; Diabetes in her other; Heart disease in her other; Mental illness in her other; Stroke in her other. Allergies  Allergen Reactions  . Cymbalta [Duloxetine Hcl]     "loopiness"  . Gabapentin Itching   Current Outpatient Prescriptions on File Prior to Visit  Medication Sig Dispense Refill  . atorvastatin (LIPITOR) 10 MG tablet TAKE 1 TABLET BY MOUTH DAILY  90 tablet  3  . diltiazem (CARDIZEM CD) 360 MG 24 hr capsule TAKE ONE CAPSULE BY MOUTH DAILY  30 capsule  0  . furosemide (LASIX) 20 MG tablet Take 2 tablets (40 mg total) by mouth daily.  60 tablet  1  . glimepiride (AMARYL) 2 MG tablet TAKE 1 TABLET BY MOUTH DAILY BEFORE BREAKFAST  180 tablet  3  . lisinopril (PRINIVIL,ZESTRIL) 10 MG tablet TAKE 1 TABLET BY MOUTH DAILY  30 tablet  5  .  metFORMIN (GLUCOPHAGE) 500 MG tablet TAKE 1 TABLET BY MOUTH TWICE DAILY  60 tablet  11  . metoprolol tartrate (LOPRESSOR) 25 MG tablet TAKE 1 TABLET BY MOUTH TWICE DAILY  60 tablet  6  . potassium chloride SA (K-DUR,KLOR-CON) 20 MEQ tablet Take 1 tablet (20 mEq total) by mouth daily.  30 tablet  11  . spironolactone (ALDACTONE) 25 MG tablet TAKE 1 TABLET BY MOUTH DAILY  30 tablet  5  . XARELTO 20 MG TABS tablet TAKE 1 TABLET BY MOUTH DAILY  30 tablet  6   No current facility-administered medications on file prior to visit.    Review of Systems  Constitutional: Negative for unexpected weight change, or unusual diaphoresis  HENT: Negative for tinnitus.   Eyes: Negative for photophobia and visual disturbance.  Respiratory: Negative for choking and stridor.   Gastrointestinal: Negative for vomiting and blood in stool.  Genitourinary: Negative for hematuria and decreased urine volume.  Musculoskeletal: Negative for acute joint swelling Skin: Negative for color change and wound.  Neurological: Negative for tremors and numbness other than noted  Psychiatric/Behavioral: Negative for decreased concentration or  hyperactivity.       Objective:   Physical Exam BP 132/74  Pulse 99  Temp(Src) 97.8 F (36.6 C) (Oral)  Ht 6' (1.829 m)  Wt 338 lb  2 oz (153.372 kg)  BMI 45.85 kg/m2  SpO2 96% VS noted, mild ill Constitutional: Pt appears well-developed and well-nourished.  HENT: Head: NCAT.  Right Ear: External ear normal.  Left Ear: External ear normal.  Bilat tm's with mild erythema.  Max sinus areas non tender.  Pharynx with mild erythema, no exudate Eyes: Conjunctivae and EOM are normal. Pupils are equal, round, and reactive to light.  Neck: Normal range of motion. Neck supple.  Cardiovascular: Normal rate and regular rhythm.   Pulmonary/Chest: Effort normal and breath sounds normal.  Neurological: Pt is alert. Not confused  Skin: Skin is warm. No erythema.  Psychiatric: Pt behavior is  normal. Thought content normal.      Assessment & Plan:

## 2013-05-12 NOTE — Assessment & Plan Note (Signed)
stable overall by history and exam, recent data reviewed with pt, and pt to continue medical treatment as before,  to f/u any worsening symptoms or concerns BP Readings from Last 3 Encounters:  05/12/13 132/74  11/29/12 120/70  11/29/12 120/82

## 2013-05-26 ENCOUNTER — Other Ambulatory Visit (HOSPITAL_COMMUNITY): Payer: Self-pay | Admitting: Internal Medicine

## 2013-06-02 ENCOUNTER — Other Ambulatory Visit: Payer: Self-pay | Admitting: Internal Medicine

## 2013-06-11 ENCOUNTER — Ambulatory Visit (INDEPENDENT_AMBULATORY_CARE_PROVIDER_SITE_OTHER): Payer: BC Managed Care – PPO | Admitting: Physician Assistant

## 2013-06-11 ENCOUNTER — Encounter: Payer: Self-pay | Admitting: Physician Assistant

## 2013-06-11 VITALS — BP 126/80 | HR 98 | Temp 98.0°F | Resp 16 | Wt 346.5 lb

## 2013-06-11 DIAGNOSIS — J209 Acute bronchitis, unspecified: Secondary | ICD-10-CM

## 2013-06-11 MED ORDER — HYDROCODONE-HOMATROPINE 5-1.5 MG/5ML PO SYRP: 5.0000 mL | ORAL_SOLUTION | Freq: Four times a day (QID) | ORAL | Status: DC | PRN

## 2013-06-11 MED ORDER — AZITHROMYCIN 250 MG PO TABS: ORAL_TABLET | ORAL | Status: DC

## 2013-06-11 NOTE — Progress Notes (Signed)
Patient ID: Pamela [name], female   DOB: 06/26/1955, 58 y.o.   MRN: [id number]  Patient presents to clinic today complaining of cough is occasionally productive of sputum, nasal congestion, and chest tightness. Patient was seen on December 4 then diagnosed with acute bronchitis. Patient was given prescription for Levaquin Hycodan. Patient states she took medication as prescribed. Symptoms dissipated. Patient states she is prone to bronchitis and usually gets several episodes per year. Patient does have history of allergy, but denies asthma. Patient denies fever, chills, fatigue. Denies recent travel or sick contact.  Past Medical History  Diagnosis Date  . DIABETES MELLITUS, TYPE II 12/31/2007  . ANXIETY 12/31/2007  . DEPRESSION 12/31/2007  . HYPERTENSION 12/31/2007  . ALLERGIC RHINITIS 12/31/2007  . Right knee DJD   . Rosacea 10/11/2010  . Diastolic dysfunction 10/11/2010  . Atrial fibrillation   . Migraine   . PNA (pneumonia)     in her 580s  . Hyperlipidemia 04/23/2012    Current Outpatient Prescriptions on File Prior to Visit  Medication Sig Dispense Refill  . atorvastatin (LIPITOR) 10 MG tablet TAKE 1 TABLET BY MOUTH DAILY  90 tablet  3  . diltiazem (CARDIZEM CD) 360 MG 24 hr capsule TAKE 1 CAPSULE BY MOUTH EVERY DAY  30 capsule  6  . furosemide (LASIX) 20 MG tablet Take 2 tablets (40 mg total) by mouth daily.  60 tablet  1  . glimepiride (AMARYL) 2 MG tablet TAKE 1 TABLET BY MOUTH DAILY BEFORE BREAKFAST  180 tablet  3  . lisinopril (PRINIVIL,ZESTRIL) 10 MG tablet TAKE 1 TABLET BY MOUTH DAILY  30 tablet  5  . metFORMIN (GLUCOPHAGE) 500 MG tablet TAKE 1 TABLET BY MOUTH TWICE DAILY  60 tablet  11  . metoprolol tartrate (LOPRESSOR) 25 MG tablet TAKE 1 TABLET BY MOUTH TWICE DAILY  60 tablet  6  . potassium chloride SA (K-DUR,KLOR-CON) 20 MEQ tablet Take 1 tablet (20 mEq total) by mouth daily.  30 tablet  11  . spironolactone (ALDACTONE) 25 MG tablet TAKE 1 TABLET BY MOUTH DAILY  30 tablet  5  .  XARELTO 20 MG TABS tablet TAKE 1 TABLET BY MOUTH DAILY  30 tablet  6   No current facility-administered medications on file prior to visit.    Allergies  Allergen Reactions  . Cymbalta [Duloxetine Hcl]     "loopiness"  . Gabapentin Itching    Family History  Problem Relation Age of Onset  . Arthritis Other   . Cancer Other     breast  . Heart disease Other   . Stroke Other   . Mental illness Other     emotional  . Diabetes Other     History   Social History  . Marital Status: Single    Spouse Name: N/A    Number of Children: 0  . Years of Education: N/A   Occupational History  . [employer] Customer serv rep    Social History Main Topics  . Smoking status: Never Smoker   . Smokeless tobacco: None  . Alcohol Use: Yes     Comment: social  . Drug Use: No  . Sexual Activity: None   Other Topics Concern  . None   Social History Narrative  . None    Review of Systems - See HPI.  All other ROS are negative.  Filed Vitals:   06/11/13 1125  BP: 126/80  Pulse: 98  Temp: 98 F (36.7 C)  Resp: 16  Physical Exam  Vitals reviewed. Constitutional: She is oriented to person, place, and time and well-developed, well-nourished, and in no distress.  HENT:  Head: Normocephalic and atraumatic.  Right Ear: External ear normal.  Left Ear: External ear normal.  Nose: Nose normal.  Mouth/Throat: Oropharynx is clear and moist. No oropharyngeal exudate.  Tympanic membranes within normal limits bilaterally. No tenderness to percussion of sinuses noted on examination.  Eyes: Conjunctivae are normal. Pupils are equal, round, and reactive to light.  Neck: Neck supple.  Cardiovascular: Normal rate, regular rhythm, normal heart sounds and intact distal pulses.   Pulmonary/Chest: Effort normal and breath sounds normal. No respiratory distress. She has no wheezes. She has no rales. She exhibits no tenderness.  Lymphadenopathy:    She has no cervical adenopathy.  Neurological: She  is alert and oriented to person, place, and time.  Skin: Skin is warm and dry. No rash noted.  Psychiatric: Affect normal.    Assessment/Plan: No problem-specific assessment & plan notes found for this encounter.

## 2013-06-11 NOTE — Patient Instructions (Signed)
Please take medications as prescribed.  Increase fluid intake.  Rest.  Saline nasal spray. Place a humidifier in the bedroom.  If symptoms not improving in 2-3 days, you need to return for chest x-ray.  If workup negative and cough persists, we will have to address other causes of chronic cough, including acid reflux, medications and COPD.

## 2013-06-11 NOTE — Assessment & Plan Note (Signed)
Rx azithromycin. Refill Hycodan. Increase fluid intake. Rest. Saline nasal spray. Probiotic. Plain Mucinex. Humidifier in bedroom. Discussed chest x-ray. Patient declines at present. Please call or return to clinic if symptoms not improving over the next few days, as we may need to obtain chest x-ray.

## 2013-06-11 NOTE — Progress Notes (Signed)
Pre visit review using our clinic review tool, if applicable. No additional management support is needed unless otherwise documented below in the visit note. 

## 2013-06-23 ENCOUNTER — Encounter: Payer: Self-pay | Admitting: Internal Medicine

## 2013-06-23 ENCOUNTER — Telehealth: Payer: Self-pay | Admitting: Internal Medicine

## 2013-06-23 NOTE — Telephone Encounter (Signed)
Message copied by Etheleen SiaSTUBBS, NANCY W on Thu Jun 23, 2013  9:41 AM ------      Message from: Corwin LevinsJOHN, JAMES W      Created: Thu May 12, 2013  5:45 PM      Regarding: to call pt       For cpx with labs prior in 1 mo ------

## 2013-06-23 NOTE — Telephone Encounter (Signed)
Unable to reach by phone.  Mailed a letter on Jan. 15, 2015.

## 2013-07-05 ENCOUNTER — Other Ambulatory Visit (HOSPITAL_COMMUNITY): Payer: Self-pay | Admitting: Internal Medicine

## 2013-11-17 ENCOUNTER — Other Ambulatory Visit (HOSPITAL_COMMUNITY): Payer: Self-pay | Admitting: Internal Medicine

## 2013-12-01 ENCOUNTER — Other Ambulatory Visit: Payer: Self-pay | Admitting: Internal Medicine

## 2013-12-01 ENCOUNTER — Other Ambulatory Visit (HOSPITAL_COMMUNITY): Payer: Self-pay | Admitting: Adult Health

## 2013-12-12 ENCOUNTER — Telehealth: Payer: Self-pay | Admitting: *Deleted

## 2013-12-12 DIAGNOSIS — E119 Type 2 diabetes mellitus without complications: Secondary | ICD-10-CM

## 2013-12-12 NOTE — Telephone Encounter (Signed)
Left message on machine for patient to schedule a follow up appointment for diabetes and cholesterol Lipid, bmet, a1c ordered Diabetic bundle

## 2013-12-14 ENCOUNTER — Other Ambulatory Visit (HOSPITAL_COMMUNITY): Payer: Self-pay | Admitting: Internal Medicine

## 2013-12-29 ENCOUNTER — Other Ambulatory Visit (HOSPITAL_COMMUNITY): Payer: Self-pay | Admitting: Cardiology

## 2013-12-29 DIAGNOSIS — I5022 Chronic systolic (congestive) heart failure: Secondary | ICD-10-CM

## 2014-01-04 ENCOUNTER — Ambulatory Visit (HOSPITAL_BASED_OUTPATIENT_CLINIC_OR_DEPARTMENT_OTHER)
Admission: RE | Admit: 2014-01-04 | Discharge: 2014-01-04 | Disposition: A | Discharge status: Home / Self Care | Payer: BC Managed Care – PPO | Source: Ambulatory Visit | Attending: Cardiology | Admitting: Cardiology

## 2014-01-04 ENCOUNTER — Ambulatory Visit (HOSPITAL_COMMUNITY)
Admission: RE | Admit: 2014-01-04 | Discharge: 2014-01-04 | Disposition: A | Discharge status: Home / Self Care | Payer: BC Managed Care – PPO | Source: Ambulatory Visit | Attending: Internal Medicine | Admitting: Internal Medicine

## 2014-01-04 DIAGNOSIS — E785 Hyperlipidemia, unspecified: Secondary | ICD-10-CM

## 2014-01-04 DIAGNOSIS — I5022 Chronic systolic (congestive) heart failure: Secondary | ICD-10-CM

## 2014-01-04 DIAGNOSIS — I517 Cardiomegaly: Secondary | ICD-10-CM

## 2014-01-04 DIAGNOSIS — E119 Type 2 diabetes mellitus without complications: Secondary | ICD-10-CM | POA: Insufficient documentation

## 2014-01-04 DIAGNOSIS — I482 Chronic atrial fibrillation, unspecified: Secondary | ICD-10-CM

## 2014-01-04 DIAGNOSIS — I10 Essential (primary) hypertension: Secondary | ICD-10-CM | POA: Insufficient documentation

## 2014-01-04 DIAGNOSIS — I5032 Chronic diastolic (congestive) heart failure: Secondary | ICD-10-CM

## 2014-01-04 DIAGNOSIS — I4891 Unspecified atrial fibrillation: Secondary | ICD-10-CM

## 2014-01-04 DIAGNOSIS — I509 Heart failure, unspecified: Secondary | ICD-10-CM | POA: Insufficient documentation

## 2014-01-04 MED ORDER — METOPROLOL TARTRATE 25 MG PO TABS: 25.0000 mg | ORAL_TABLET | Freq: Two times a day (BID) | ORAL | Status: DC

## 2014-01-04 MED ORDER — RIVAROXABAN 20 MG PO TABS: 20.0000 mg | ORAL_TABLET | Freq: Every day | ORAL | Status: DC

## 2014-01-04 MED ORDER — FUROSEMIDE 20 MG PO TABS: ORAL_TABLET | ORAL | Status: DC

## 2014-01-04 NOTE — Patient Instructions (Signed)
INCREASE Lasix to 40 mg in the AM and 20 mg in the PM  Labs needed in 2 weeks (FLP,BMET,BNP, and CBC)  \Your physician recommends that you schedule a follow-up appointment in: 2 months  Do the following things EVERYDAY: 1) Weigh yourself in the morning before breakfast. Write it down and keep it in a log. 2) Take your medicines as prescribed 3) Eat low salt foods-Limit salt (sodium) to 2000 mg per day.  4) Stay as active as you can everyday Limit all fluids for the day to less than 2 liters

## 2014-01-05 NOTE — Progress Notes (Signed)
Patient ID: Pamela [name], female   DOB: 07-08-1955, 58 y.o.   MRN: [id number] PCP: Dr. Jacquelynn [name]  Pamela [name] is a 58 y/o with h/o DM2, obesity, HTN, chronic Afib on Xarelto and chronic diastolic HF, echo 09/2010 with EF 55%.   She underwent cardiac cath in April 2012 which showed normal coronaries. Did very well with rate control and diuresis. Started on coumadin. We saw her in May and switched her to Xarelto due to problems regulating INRs. She underwent DC-CV in June 2012 which failed so we opted for rate control strategy.  Today, she reports a chronic cough for about 6 wks.  She is able to walk in [employer] without dyspnea but she is short of breath walking fast or walking up steps.   This has been worse for about 3 months.   Labs (6/14): Creatinine 1.3, Potassium 4.4, LDL 75, HDL 56, K 4.4, creatinine 1.3  ROS: All systems negative except as listed in HPI, PMH and Problem List.  Past Medical History  Diagnosis Date  . DIABETES MELLITUS, TYPE II 12/31/2007  . ANXIETY 12/31/2007  . DEPRESSION 12/31/2007  . HYPERTENSION 12/31/2007  . ALLERGIC RHINITIS 12/31/2007  . Right knee DJD   . Rosacea 10/11/2010  . Diastolic dysfunction 10/11/2010  . Atrial fibrillation   . Migraine   . PNA (pneumonia)     in her 3920s  . Hyperlipidemia 04/23/2012    Current Outpatient Prescriptions  Medication Sig Dispense Refill  . atorvastatin (LIPITOR) 10 MG tablet TAKE 1 TABLET BY MOUTH DAILY  90 tablet  3  . diltiazem (CARDIZEM CD) 360 MG 24 hr capsule Take 1 capsule (360 mg total) by mouth daily. NEEDS OFFICE VISIT FOR FURTHER REFILLS  30 capsule  0  . furosemide (LASIX) 20 MG tablet Take 40 mg in the AM and 20mg  in the PM  90 tablet  3  . glimepiride (AMARYL) 2 MG tablet TAKE 1 TABLET BY MOUTH DAILY BEFORE BREAKFAST  180 tablet  3  . metFORMIN (GLUCOPHAGE) 500 MG tablet TAKE 1 TABLET BY MOUTH TWICE DAILY  60 tablet  11  . metoprolol tartrate (LOPRESSOR) 25 MG tablet Take 1 tablet (25 mg total) by mouth 2 (two) times  daily.  60 tablet  6  . potassium chloride SA (K-DUR,KLOR-CON) 20 MEQ tablet TAKE 1 TABLET BY MOUTH DAILY  30 tablet  0  . rivaroxaban (XARELTO) 20 MG TABS tablet Take 1 tablet (20 mg total) by mouth daily with supper.  30 tablet  6  . spironolactone (ALDACTONE) 25 MG tablet TAKE 1 TABLET BY MOUTH DAILY  30 tablet  6   No current facility-administered medications for this encounter.   PHYSICAL EXAM: There were no vitals filed for this visit. General: Obese  Well appearing. No resp difficulty HEENT: normal Neck: supple. JVP 8 cm, Carotids 2+ bilaterally; no bruits. No lymphadenopathy or thryomegaly appreciated. Cor: PMI normal. Irregular rate & rhythm. No rubs, gallops or murmurs.  2+ edema 1/2 to knees bilaterally.  Lungs: clear Abdomen: obese soft, nontender, nondistended.  Good bowel sounds. Extremities: no cyanosis, clubbing, rash. Neuro: alert & orientedx3, cranial nerves grossly intact. Moves all 4 extremities w/o difficulty. Affect pleasant.  ASSESSMENT & PLAN: 1. Atrial fibrillation: Chronic.  Reasonable rate control.  Continue metoprolol and diltiazem CD.  Continue Xarelto.  Will check CBC.  2. Chronic diastolic CHF: Patient does appear at least mildly volume overloaded today with NYHA class II-III symptoms.  - Increase Lasix to 40 qam, 20 qpm.  -  BMET/BNP in 10 days.  3. Hyperlipidemia: Check lipids with next labs as well.   Followup in 2 months.   Pamela [name] 01/05/2014

## 2014-01-10 ENCOUNTER — Other Ambulatory Visit (HOSPITAL_COMMUNITY): Payer: Self-pay | Admitting: Cardiology

## 2014-01-10 DIAGNOSIS — I509 Heart failure, unspecified: Secondary | ICD-10-CM

## 2014-01-10 MED ORDER — POTASSIUM CHLORIDE CRYS ER 20 MEQ PO TBCR: EXTENDED_RELEASE_TABLET | ORAL | Status: DC

## 2014-01-14 ENCOUNTER — Ambulatory Visit (INDEPENDENT_AMBULATORY_CARE_PROVIDER_SITE_OTHER): Payer: BC Managed Care – PPO | Admitting: Family Medicine

## 2014-01-14 ENCOUNTER — Encounter: Payer: Self-pay | Admitting: Family Medicine

## 2014-01-14 VITALS — BP 135/80 | Temp 97.9°F | Wt 354.0 lb

## 2014-01-14 DIAGNOSIS — J209 Acute bronchitis, unspecified: Secondary | ICD-10-CM

## 2014-01-14 DIAGNOSIS — J069 Acute upper respiratory infection, unspecified: Secondary | ICD-10-CM

## 2014-01-14 MED ORDER — PREDNISONE 20 MG PO TABS: ORAL_TABLET | ORAL | Status: DC

## 2014-01-14 MED ORDER — AZITHROMYCIN 250 MG PO TABS: ORAL_TABLET | ORAL | Status: DC

## 2014-01-14 NOTE — Progress Notes (Signed)
OFFICE VISIT  01/14/2014   CC:  Chief Complaint  Patient presents with  . Cough    x 3 weeks    HPI:    Patient is a 58 y.o. Caucasian female who presents for cough. Onset about 3 wks ago, not getting better.  Nonproductive cough.  Sometimes coughing fits--dry.  Some "allergic" type sx's initially.  Nose still stuffy.  Saw cardiologist recently, lasix increased, has f/u labs scheduled.   She doesn't smoke.  Hx of "bronchitis" yearly.  No fever.  No wheezing or SOB.   Past Medical History  Diagnosis Date  . DIABETES MELLITUS, TYPE II 12/31/2007  . ANXIETY 12/31/2007  . DEPRESSION 12/31/2007  . HYPERTENSION 12/31/2007  . ALLERGIC RHINITIS 12/31/2007  . Right knee DJD   . Rosacea 10/11/2010  . Diastolic dysfunction 10/11/2010  . Atrial fibrillation   . Migraine   . PNA (pneumonia)     in her 13s  . Hyperlipidemia 04/23/2012  CRI, stage 3 (CrCl in the 40s 2014)  Past Surgical History  Procedure Laterality Date  . 2 d&c      1980s    Outpatient Prescriptions Prior to Visit  Medication Sig Dispense Refill  . atorvastatin (LIPITOR) 10 MG tablet TAKE 1 TABLET BY MOUTH DAILY  90 tablet  3  . diltiazem (CARDIZEM CD) 360 MG 24 hr capsule Take 1 capsule (360 mg total) by mouth daily. NEEDS OFFICE VISIT FOR FURTHER REFILLS  30 capsule  0  . furosemide (LASIX) 20 MG tablet Take 40 mg in the AM and 20mg  in the PM  90 tablet  3  . glimepiride (AMARYL) 2 MG tablet TAKE 1 TABLET BY MOUTH DAILY BEFORE BREAKFAST  180 tablet  3  . metFORMIN (GLUCOPHAGE) 500 MG tablet TAKE 1 TABLET BY MOUTH TWICE DAILY  60 tablet  11  . metoprolol tartrate (LOPRESSOR) 25 MG tablet Take 1 tablet (25 mg total) by mouth 2 (two) times daily.  60 tablet  6  . potassium chloride SA (K-DUR,KLOR-CON) 20 MEQ tablet TAKE 1 TABLET BY MOUTH DAILY  30 tablet  6  . rivaroxaban (XARELTO) 20 MG TABS tablet Take 1 tablet (20 mg total) by mouth daily with supper.  30 tablet  6  . spironolactone (ALDACTONE) 25 MG tablet TAKE 1  TABLET BY MOUTH DAILY  30 tablet  6   No facility-administered medications prior to visit.    Allergies  Allergen Reactions  . Cymbalta [Duloxetine Hcl]     "loopiness"  . Gabapentin Itching    ROS As per HPI  PE: Blood pressure 135/80, temperature 97.9 F (36.6 C), temperature source Oral, weight 354 lb (160.573 kg). VS: noted--normal. Gen: alert, NAD, NONTOXIC APPEARING. HEENT: eyes without injection, drainage, or swelling.  Ears: EACs clear, TMs with normal light reflex and landmarks.  Nose: Clear rhinorrhea, with some dried, crusty exudate adherent to mildly injected mucosa.  No purulent d/c.  No paranasal sinus TTP.  No facial swelling.  Throat and mouth without focal lesion.  No pharyngial swelling, erythema, or exudate.   Neck: supple, no LAD.   LUNGS: CTA bilat, nonlabored resps.  With forced exp maneuver she has post-exhal coughing (dry/hacky). CV: Irregular rhythm, rate 80s, distant S1 and S2, no m/r. EXT: no c/c/e SKIN: no rash  LABS:  none  IMPRESSION AND PLAN:   Acute (prolonged) bronchitis. Possibly having some mild bronchospasm. I will rx Z pack and prednisone 40mg  qd x 5d, then 20mg  qd x 5d. Mucinex DM OTC  for symptomatic care.  An After Visit Summary was printed and given to the patient.  FOLLOW UP:  prn

## 2014-01-14 NOTE — Progress Notes (Signed)
Pre visit review using our clinic review tool, if applicable. No additional management support is needed unless otherwise documented below in the visit note. 

## 2014-01-14 NOTE — Patient Instructions (Signed)
Take mucinex DM OTC 1 tab twice daily for cough.

## 2014-01-18 ENCOUNTER — Ambulatory Visit (HOSPITAL_COMMUNITY)
Admission: RE | Admit: 2014-01-18 | Discharge: 2014-01-18 | Disposition: A | Discharge status: Home / Self Care | Payer: BC Managed Care – PPO | Source: Ambulatory Visit | Attending: Internal Medicine | Admitting: Internal Medicine

## 2014-01-18 ENCOUNTER — Telehealth: Payer: Self-pay | Admitting: Internal Medicine

## 2014-01-18 DIAGNOSIS — I509 Heart failure, unspecified: Secondary | ICD-10-CM | POA: Diagnosis not present

## 2014-01-18 DIAGNOSIS — I5022 Chronic systolic (congestive) heart failure: Secondary | ICD-10-CM

## 2014-01-18 DIAGNOSIS — I5032 Chronic diastolic (congestive) heart failure: Secondary | ICD-10-CM | POA: Diagnosis not present

## 2014-01-18 LAB — BASIC METABOLIC PANEL
ANION GAP: 18 — AB (ref 5–15)
BUN: 21 mg/dL (ref 6–23)
CO2: 21 mEq/L (ref 19–32)
CREATININE: 1.06 mg/dL (ref 0.50–1.10)
Calcium: 9.1 mg/dL (ref 8.4–10.5)
Chloride: 93 mEq/L — ABNORMAL LOW (ref 96–112)
GFR calc Af Amer: 66 mL/min — ABNORMAL LOW (ref >90)
GFR, EST NON AFRICAN AMERICAN: 57 mL/min — AB (ref >90)
Glucose, Bld: 308 mg/dL — ABNORMAL HIGH (ref 70–99)
Potassium: 4 mEq/L (ref 3.7–5.3)
SODIUM: 132 meq/L — AB (ref 137–147)

## 2014-01-18 LAB — PRO B NATRIURETIC PEPTIDE: Pro B Natriuretic peptide (BNP): 740.8 pg/mL — ABNORMAL HIGH (ref 0–125)

## 2014-01-18 NOTE — Telephone Encounter (Signed)
Left patient voicemail to schedule cpe.  Last cpe was 05/12/2013.

## 2014-01-19 ENCOUNTER — Other Ambulatory Visit (HOSPITAL_COMMUNITY): Payer: Self-pay | Admitting: Internal Medicine

## 2014-02-02 ENCOUNTER — Encounter: Payer: Self-pay | Admitting: Internal Medicine

## 2014-02-02 ENCOUNTER — Ambulatory Visit (INDEPENDENT_AMBULATORY_CARE_PROVIDER_SITE_OTHER): Payer: BC Managed Care – PPO | Admitting: Internal Medicine

## 2014-02-02 VITALS — BP 112/78 | HR 119 | Temp 98.2°F | Wt 354.0 lb

## 2014-02-02 DIAGNOSIS — M6740 Ganglion, unspecified site: Secondary | ICD-10-CM

## 2014-02-02 DIAGNOSIS — I10 Essential (primary) hypertension: Secondary | ICD-10-CM

## 2014-02-02 DIAGNOSIS — J309 Allergic rhinitis, unspecified: Secondary | ICD-10-CM

## 2014-02-02 MED ORDER — METHYLPREDNISOLONE ACETATE 80 MG/ML IJ SUSP
80.0000 mg | Freq: Once | INTRAMUSCULAR | Status: AC
  Administered 2014-02-02: 80 mg via INTRAMUSCULAR

## 2014-02-02 MED ORDER — FLUTICASONE PROPIONATE 50 MCG/ACT NA SUSP: 2.0000 | Freq: Every day | NASAL | Status: DC

## 2014-02-02 MED ORDER — CETIRIZINE HCL 10 MG PO TABS: 10.0000 mg | ORAL_TABLET | Freq: Every day | ORAL | Status: DC

## 2014-02-02 NOTE — Progress Notes (Signed)
Pre visit review using our clinic review tool, if applicable. No additional management support is needed unless otherwise documented below in the visit note. 

## 2014-02-02 NOTE — Patient Instructions (Signed)
You had the steroid shot today  Please take all new medication as prescribed - the zyrtec , and the flonase   Please continue all other medications as before, and refills have been done if requested.  Please have the pharmacy call with any other refills you may need.  Please keep your appointments with your specialists as you may have planned

## 2014-02-06 DIAGNOSIS — M6740 Ganglion, unspecified site: Secondary | ICD-10-CM | POA: Insufficient documentation

## 2014-02-06 NOTE — Assessment & Plan Note (Signed)
Mild to mod, for depomedrol IM, predpac asd, to f/u any worsening symptoms or concerns 

## 2014-02-06 NOTE — Assessment & Plan Note (Signed)
stable overall by history and exam, recent data reviewed with pt, and pt to continue medical treatment as before,  to f/u any worsening symptoms or concerns BP Readings from Last 3 Encounters:  02/02/14 112/78  01/14/14 135/80  06/11/13 126/80

## 2014-02-06 NOTE — Progress Notes (Signed)
Subjective:    Patient ID: Pamela [name], female    DOB: March 17, 1956, 58 y.o.   MRN: [id number]  HPI  Here with ? Of infectious URI, but on hx  Does have several wks ongoing nasal allergy symptoms with clearish congestion, itch and sneezing, without fever, pain, ST, cough, swelling or wheezing.  Also with a cystic mild tender lesion right wrist for 2 mo, very mild, just wondering what it might be.  Does work with hand quite a bit in the home.  Pt denies chest pain, increased sob or doe, wheezing, orthopnea, PND, increased LE swelling, palpitations, dizziness or syncope.  Pt denies new neurological symptoms such as new headache, or facial or extremity weakness or numbness Past Medical History  Diagnosis Date  . DIABETES MELLITUS, TYPE II 12/31/2007  . ANXIETY 12/31/2007  . DEPRESSION 12/31/2007  . HYPERTENSION 12/31/2007  . ALLERGIC RHINITIS 12/31/2007  . Right knee DJD   . Rosacea 10/11/2010  . Diastolic dysfunction 10/11/2010  . Atrial fibrillation   . Migraine   . PNA (pneumonia)     in her 22s  . Hyperlipidemia 04/23/2012   Past Surgical History  Procedure Laterality Date  . 2 d&c      1980s    reports that she has never smoked. She does not have any smokeless tobacco history on file. She reports that she drinks alcohol. She reports that she does not use illicit drugs. family history includes Arthritis in her other; Cancer in her other; Diabetes in her other; Heart disease in her other; Mental illness in her other; Stroke in her other. Allergies  Allergen Reactions  . Cymbalta [Duloxetine Hcl]     "loopiness"  . Gabapentin Itching   Current Outpatient Prescriptions on File Prior to Visit  Medication Sig Dispense Refill  . atorvastatin (LIPITOR) 10 MG tablet TAKE 1 TABLET BY MOUTH DAILY  90 tablet  3  . azithromycin (ZITHROMAX) 250 MG tablet 2 tabs po qd x 1d, then 1 tab po qd x 4d  6 tablet  0  . diltiazem (CARDIZEM CD) 360 MG 24 hr capsule TAKE ONE CAPSULE BY MOUTH EVERY DAY  30  capsule  3  . furosemide (LASIX) 20 MG tablet Take 40 mg in the AM and  in the PM  90 tablet  3  . glimepiride (AMARYL) 2 MG tablet TAKE 1 TABLET BY MOUTH DAILY BEFORE BREAKFAST  180 tablet  3  . metFORMIN (GLUCOPHAGE) 500 MG tablet TAKE 1 TABLET BY MOUTH TWICE DAILY  60 tablet  11  . metoprolol tartrate (LOPRESSOR) 25 MG tablet Take 1 tablet (25 mg total) by mouth 2 (two) times daily.  60 tablet  6  . potassium chloride SA (K-DUR,KLOR-CON) 20 MEQ tablet TAKE 1 TABLET BY MOUTH DAILY  30 tablet  6  . predniSONE (DELTASONE) 20 MG tablet 2 tabs po qd x 5d, then 1 tab po qd x 5d  15 tablet  0  . rivaroxaban (XARELTO) 20 MG TABS tablet Take 1 tablet (20 mg total) by mouth daily with supper.  30 tablet  6  . spironolactone (ALDACTONE) 25 MG tablet TAKE 1 TABLET BY MOUTH DAILY  30 tablet  6   No current facility-administered medications on file prior to visit.   Review of Systems  Constitutional: Negative for unusual diaphoresis or other sweats  HENT: Negative for ringing in ear Eyes: Negative for double vision or worsening visual disturbance.  Respiratory: Negative for choking and stridor.   Gastrointestinal:  Negative for vomiting or other signifcant bowel change Genitourinary: Negative for hematuria or decreased urine volume.  Musculoskeletal: Negative for other MSK pain or swelling Skin: Negative for color change and worsening wound.  Neurological: Negative for tremors and numbness other than noted  Psychiatric/Behavioral: Negative for decreased concentration or agitation other than above       Objective:   Physical Exam BP 112/78  Pulse 119  Temp(Src) 98.2 F (36.8 C) (Oral)  Wt 354 lb (160.573 kg)  SpO2 92% VS noted,  Constitutional: Pt appears well-developed, well-nourished.  HENT: Head: NCAT.  Right Ear: External ear normal.  Left Ear: External ear normal.  Eyes: . Pupils are equal, round, and reactive to light. Conjunctivae and EOM are normal Neck: Normal range of motion.  Neck supple.  Bilat tm's with mild erythema.  Max sinus areas mild tender.  Pharynx with mild erythema, no exudate Cardiovascular: Normal rate and regular rhythm.   Pulmonary/Chest: Effort normal and breath sounds normal.  - no rales or wheezing Neurological: Pt is alert. Not confused , motor grossly intact Right wrist with 5th volar compartment cystic ganglion type lesion < 1/2 cm, mild tender Psychiatric: Pt behavior is normal. No agitation.     Assessment & Plan:

## 2014-02-06 NOTE — Assessment & Plan Note (Signed)
Right wrist, small , mild tender, declines hand surgury referall for now

## 2014-03-06 ENCOUNTER — Other Ambulatory Visit (HOSPITAL_COMMUNITY): Payer: Self-pay | Admitting: Internal Medicine

## 2014-03-06 ENCOUNTER — Encounter (HOSPITAL_COMMUNITY): Payer: Self-pay

## 2014-03-06 ENCOUNTER — Ambulatory Visit (HOSPITAL_COMMUNITY)
Admission: RE | Admit: 2014-03-06 | Discharge: 2014-03-06 | Disposition: A | Discharge status: Home / Self Care | Payer: BC Managed Care – PPO | Source: Ambulatory Visit | Attending: Cardiology | Admitting: Cardiology

## 2014-03-06 VITALS — BP 126/76 | HR 109 | Wt 347.1 lb

## 2014-03-06 DIAGNOSIS — I5032 Chronic diastolic (congestive) heart failure: Secondary | ICD-10-CM | POA: Insufficient documentation

## 2014-03-06 DIAGNOSIS — E119 Type 2 diabetes mellitus without complications: Secondary | ICD-10-CM | POA: Insufficient documentation

## 2014-03-06 DIAGNOSIS — I509 Heart failure, unspecified: Secondary | ICD-10-CM | POA: Insufficient documentation

## 2014-03-06 DIAGNOSIS — I4891 Unspecified atrial fibrillation: Secondary | ICD-10-CM | POA: Insufficient documentation

## 2014-03-06 DIAGNOSIS — I482 Chronic atrial fibrillation, unspecified: Secondary | ICD-10-CM

## 2014-03-06 DIAGNOSIS — Z79899 Other long term (current) drug therapy: Secondary | ICD-10-CM | POA: Insufficient documentation

## 2014-03-06 DIAGNOSIS — Z7901 Long term (current) use of anticoagulants: Secondary | ICD-10-CM | POA: Diagnosis not present

## 2014-03-06 LAB — CBC
HCT: 42.3 % (ref 36.0–46.0)
Hemoglobin: 14.1 g/dL (ref 12.0–15.0)
MCH: 26.9 pg (ref 26.0–34.0)
MCHC: 33.3 g/dL (ref 30.0–36.0)
MCV: 80.6 fL (ref 78.0–100.0)
Platelets: 276 10*3/uL (ref 150–400)
RBC: 5.25 MIL/uL — AB (ref 3.87–5.11)
RDW: 14.6 % (ref 11.5–15.5)
WBC: 15.5 10*3/uL — AB (ref 4.0–10.5)

## 2014-03-06 LAB — BASIC METABOLIC PANEL
Anion gap: 15 (ref 5–15)
BUN: 21 mg/dL (ref 6–23)
CO2: 27 mEq/L (ref 19–32)
Calcium: 9.4 mg/dL (ref 8.4–10.5)
Chloride: 94 mEq/L — ABNORMAL LOW (ref 96–112)
Creatinine, Ser: 1.2 mg/dL — ABNORMAL HIGH (ref 0.50–1.10)
GFR calc non Af Amer: 49 mL/min — ABNORMAL LOW (ref >90)
GFR, EST AFRICAN AMERICAN: 57 mL/min — AB (ref >90)
Glucose, Bld: 205 mg/dL — ABNORMAL HIGH (ref 70–99)
POTASSIUM: 4.1 meq/L (ref 3.7–5.3)
SODIUM: 136 meq/L — AB (ref 137–147)

## 2014-03-06 MED ORDER — METOPROLOL SUCCINATE ER 25 MG PO TB24: 25.0000 mg | ORAL_TABLET | Freq: Two times a day (BID) | ORAL | Status: DC

## 2014-03-06 NOTE — Patient Instructions (Signed)
Stop Metoprolol Tartrate   Start Metoprolol Succinate 25 mg Twice daily   Labs today  We will contact you in 6 months to schedule your next appointment.

## 2014-03-07 NOTE — Progress Notes (Signed)
Patient ID: Pamela [name], female   DOB: 12/27/1955, 58 y.o.   MRN: [id number] PCP: Dr. Jacquelynn CreeJohn  Pamela [name] is a 58 y/o with h/o DM2, obesity, HTN, chronic Afib on Xarelto and chronic diastolic HF, echo 09/2010 with EF 55%.   She underwent cardiac cath in April 2012 which showed normal coronaries. Did very well with rate control and diuresis. Started on coumadin. We saw her in May and switched her to Xarelto due to problems regulating INRs. She underwent DC-CV in June 2012 which failed so we opted for rate control strategy.  At last appointment, she reported increased dyspnea, so I increased her Lasix.  Breathing is better.  She has mild dyspnea walking up a flight of steps.  She is ok on flat ground.  No chest pain, palpitations, orthopnea or syncope.   ECG: atrial fibrillation with RVR at 101, nonspecific diffuse T wave flattening  Labs (6/14): Creatinine 1.3, Potassium 4.4, LDL 75, HDL 56, K 4.4, creatinine 1.3 Labs (3/15): K 4, creatinine 1.06, BNP 741  ROS: All systems negative except as listed in HPI, PMH and Problem List.  Past Medical History  Diagnosis Date  . DIABETES MELLITUS, TYPE II 12/31/2007  . ANXIETY 12/31/2007  . DEPRESSION 12/31/2007  . HYPERTENSION 12/31/2007  . ALLERGIC RHINITIS 12/31/2007  . Right knee DJD   . Rosacea 10/11/2010  . Diastolic dysfunction 10/11/2010  . Atrial fibrillation   . Migraine   . PNA (pneumonia)     in her 7620s  . Hyperlipidemia 04/23/2012    Current Outpatient Prescriptions  Medication Sig Dispense Refill  . atorvastatin (LIPITOR) 10 MG tablet TAKE 1 TABLET BY MOUTH DAILY  90 tablet  3  . cetirizine (ZYRTEC) 10 MG tablet Take 1 tablet (10 mg total) by mouth daily.  30 tablet  11  . diltiazem (CARDIZEM CD) 360 MG 24 hr capsule TAKE ONE CAPSULE BY MOUTH EVERY DAY  30 capsule  3  . fluticasone (FLONASE) 50 MCG/ACT nasal spray Place 2 sprays into both nostrils daily.  16 g  2  . furosemide (LASIX) 20 MG tablet Take 40 mg in the AM and 20mg  in the PM  90  tablet  3  . glimepiride (AMARYL) 2 MG tablet TAKE 1 TABLET BY MOUTH DAILY BEFORE BREAKFAST  180 tablet  3  . metFORMIN (GLUCOPHAGE) 500 MG tablet TAKE 1 TABLET BY MOUTH TWICE DAILY  60 tablet  11  . potassium chloride SA (K-DUR,KLOR-CON) 20 MEQ tablet TAKE 1 TABLET BY MOUTH DAILY  30 tablet  6  . predniSONE (DELTASONE) 20 MG tablet 2 tabs po qd x 5d, then 1 tab po qd x 5d  15 tablet  0  . rivaroxaban (XARELTO) 20 MG TABS tablet Take 1 tablet (20 mg total) by mouth daily with supper.  30 tablet  6  . spironolactone (ALDACTONE) 25 MG tablet TAKE 1 TABLET BY MOUTH DAILY  30 tablet  6  . metoprolol succinate (TOPROL XL) 25 MG 24 hr tablet Take 1 tablet (25 mg total) by mouth 2 (two) times daily.  60 tablet  6   No current facility-administered medications for this encounter.   PHYSICAL EXAM: Filed Vitals:   03/06/14 1455  BP: 126/76  Pulse: 109  Weight: 347 lb 1.9 oz (157.453 kg)  SpO2: 97%   General: Obese  Well appearing. No resp difficulty HEENT: normal Neck: supple. JVP 7 cm, Carotids 2+ bilaterally; no bruits. No lymphadenopathy or thryomegaly appreciated. Cor: PMI normal. Mildly tachy, irregular  rate & rhythm. No rubs, gallops or murmurs.  No edema. Lungs: clear Abdomen: obese soft, nontender, nondistended.  Good bowel sounds. Extremities: no cyanosis, clubbing, rash. Neuro: alert & orientedx3, cranial nerves grossly intact. Moves all 4 extremities w/o difficulty. Affect pleasant.  ASSESSMENT & PLAN: 1. Atrial fibrillation: Chronic.  Rate is a bit elevated.  Continue current diltiazem CD.  I will stop metoprolol and start Toprol XL 25 mg bid.  Continue Xarelto.  Will check CBC.  2. Chronic diastolic CHF: NYHA class II symptoms, volume improved.  - Continue Lasix 40 qam, 20 qpm.  - BMET today.  Followup in 6 months.   Pamela [name] 03/07/2014

## 2014-05-01 ENCOUNTER — Other Ambulatory Visit: Payer: Self-pay | Admitting: Internal Medicine

## 2014-05-19 ENCOUNTER — Encounter: Payer: BC Managed Care – PPO | Admitting: Internal Medicine

## 2014-05-27 ENCOUNTER — Other Ambulatory Visit (HOSPITAL_COMMUNITY): Payer: Self-pay | Admitting: Cardiology

## 2014-05-27 ENCOUNTER — Other Ambulatory Visit (HOSPITAL_COMMUNITY): Payer: Self-pay | Admitting: Internal Medicine

## 2014-06-05 ENCOUNTER — Other Ambulatory Visit: Payer: Self-pay | Admitting: Internal Medicine

## 2014-06-12 ENCOUNTER — Telehealth: Payer: Self-pay | Admitting: Internal Medicine

## 2014-06-12 NOTE — Telephone Encounter (Signed)
Patient is in the process of getting hired at [facility].  She is requesting a TB test.  Is this ok to schedule.

## 2014-06-13 NOTE — Telephone Encounter (Signed)
Ok for nurse visit for this

## 2014-06-13 NOTE — Telephone Encounter (Signed)
Left vm for patient to call back to schedule

## 2014-06-14 ENCOUNTER — Ambulatory Visit (INDEPENDENT_AMBULATORY_CARE_PROVIDER_SITE_OTHER): Payer: BLUE CROSS/BLUE SHIELD | Admitting: *Deleted

## 2014-06-14 DIAGNOSIS — Z23 Encounter for immunization: Secondary | ICD-10-CM

## 2014-06-16 ENCOUNTER — Encounter: Payer: Self-pay | Admitting: *Deleted

## 2014-06-16 LAB — TB SKIN TEST
Induration: 0 mm
TB SKIN TEST: NEGATIVE

## 2014-08-05 ENCOUNTER — Other Ambulatory Visit (HOSPITAL_COMMUNITY): Payer: Self-pay | Admitting: Cardiology

## 2014-08-07 ENCOUNTER — Other Ambulatory Visit (HOSPITAL_COMMUNITY): Payer: Self-pay

## 2014-08-07 MED ORDER — SPIRONOLACTONE 25 MG PO TABS: 25.0000 mg | ORAL_TABLET | Freq: Every day | ORAL | Status: DC

## 2014-08-18 ENCOUNTER — Other Ambulatory Visit: Payer: Self-pay | Admitting: Internal Medicine

## 2014-08-21 ENCOUNTER — Other Ambulatory Visit (HOSPITAL_COMMUNITY): Payer: Self-pay | Admitting: *Deleted

## 2014-08-21 MED ORDER — DILTIAZEM HCL ER COATED BEADS 360 MG PO CP24: 360.0000 mg | ORAL_CAPSULE | Freq: Every day | ORAL | Status: DC

## 2014-08-23 ENCOUNTER — Other Ambulatory Visit (HOSPITAL_COMMUNITY): Payer: Self-pay | Admitting: *Deleted

## 2014-08-23 DIAGNOSIS — I5022 Chronic systolic (congestive) heart failure: Secondary | ICD-10-CM

## 2014-08-23 MED ORDER — POTASSIUM CHLORIDE CRYS ER 20 MEQ PO TBCR: EXTENDED_RELEASE_TABLET | ORAL | Status: DC

## 2014-08-29 ENCOUNTER — Other Ambulatory Visit (HOSPITAL_COMMUNITY): Payer: Self-pay | Admitting: *Deleted

## 2014-08-29 MED ORDER — FUROSEMIDE 20 MG PO TABS: ORAL_TABLET | ORAL | Status: DC

## 2014-09-25 ENCOUNTER — Other Ambulatory Visit (INDEPENDENT_AMBULATORY_CARE_PROVIDER_SITE_OTHER): Payer: Managed Care, Other (non HMO)

## 2014-09-25 ENCOUNTER — Encounter: Payer: Self-pay | Admitting: Family

## 2014-09-25 ENCOUNTER — Ambulatory Visit (INDEPENDENT_AMBULATORY_CARE_PROVIDER_SITE_OTHER): Payer: Managed Care, Other (non HMO) | Admitting: Family

## 2014-09-25 ENCOUNTER — Other Ambulatory Visit: Payer: Managed Care, Other (non HMO)

## 2014-09-25 VITALS — BP 110/78 | HR 114 | Temp 97.9°F | Resp 18 | Ht 72.0 in | Wt 335.8 lb

## 2014-09-25 DIAGNOSIS — R5383 Other fatigue: Secondary | ICD-10-CM

## 2014-09-25 DIAGNOSIS — E119 Type 2 diabetes mellitus without complications: Secondary | ICD-10-CM | POA: Diagnosis not present

## 2014-09-25 LAB — CBC
HCT: 44.3 % (ref 36.0–46.0)
Hemoglobin: 14.7 g/dL (ref 12.0–15.0)
MCHC: 33.2 g/dL (ref 30.0–36.0)
MCV: 79.9 fl (ref 78.0–100.0)
PLATELETS: 309 10*3/uL (ref 150.0–400.0)
RBC: 5.54 Mil/uL — AB (ref 3.87–5.11)
RDW: 15.8 % — ABNORMAL HIGH (ref 11.5–15.5)
WBC: 13.9 10*3/uL — AB (ref 4.0–10.5)

## 2014-09-25 LAB — IBC PANEL
Iron: 51 ug/dL (ref 42–145)
Saturation Ratios: 12.9 % — ABNORMAL LOW (ref 20.0–50.0)
Transferrin: 282 mg/dL (ref 212.0–360.0)

## 2014-09-25 LAB — HEMOGLOBIN A1C: HEMOGLOBIN A1C: 10.8 % — AB (ref 4.6–6.5)

## 2014-09-25 NOTE — Progress Notes (Signed)
Pre visit review using our clinic review tool, if applicable. No additional management support is needed unless otherwise documented below in the visit note. 

## 2014-09-25 NOTE — Assessment & Plan Note (Signed)
Fatigue of undetermined origin. Obtain B12, CBC, A1c, IBC, and TSH to rule out metabolic causes. Cannot rule out underlying heart failure, diabetes,  or anxiety/depression. Discussed with patient importance of controlling her diabetes and eating well and physical activity to help with her fatigue. Patient does not currently take her blood sugars at home, advised this would help with monitoring her diabetes.

## 2014-09-25 NOTE — Progress Notes (Signed)
Subjective:    Patient ID: Pamela [name], female    DOB: 1956-05-19, 59 y.o.   MRN: [id number]  Chief Complaint  Patient presents with  . Fatigue    says she hasn't felt well for a couple of weeks, says her temp has been as low as 96.0 and as high as 98.3, appetite is off and has been dragging,     HPI:  Pamela [name] is a 59 y.o. female with a history of heart failure, diabetes, anxiety, depression and atrial fibrillation who presents today for an acute visit.  This is a new problem. Associated symptoms of malaise and not feeling well, decreased appetite and energy and has been going on for a couple of weeks. Also notes that her body temperature has been running low at home ranging from 96.0 to 98.3 degrees. Denies any modifying factors that make it better or worse. Notes that she has been under a lot of stress recently which she is unsure if it is contributing to the problem.    Allergies  Allergen Reactions  . Cymbalta [Duloxetine Hcl]     "loopiness"  . Gabapentin Itching    Current Outpatient Prescriptions on File Prior to Visit  Medication Sig Dispense Refill  . atorvastatin (LIPITOR) 10 MG tablet TAKE 1 TABLET BY MOUTH DAILY 90 tablet 1  . cetirizine (ZYRTEC) 10 MG tablet Take 1 tablet (10 mg total) by mouth daily. 30 tablet 11  . diltiazem (CARDIZEM CD) 360 MG 24 hr capsule Take 1 capsule (360 mg total) by mouth daily. 30 capsule 3  . fluticasone (FLONASE) 50 MCG/ACT nasal spray USE 2 SPRAYS IN EACH NOSTRIL EVERY DAY 16 g 11  . furosemide (LASIX) 20 MG tablet TAKE 2 TABLETS BY MOUTH EVERY MORNING AND 1 EVERY EVENING 90 tablet 3  . glimepiride (AMARYL) 2 MG tablet TAKE 1 TABLET BY MOUTH DAILY WITH BREAKFAST 180 tablet 1  . metFORMIN (GLUCOPHAGE) 500 MG tablet TAKE 1 TABLET BY MOUTH TWICE DAILY 60 tablet 5  . metoprolol succinate (TOPROL-XL) 25 MG 24 hr tablet TAKE 1 TABLET BY MOUTH TWICE DAILY 180 tablet 6  . potassium chloride SA (K-DUR,KLOR-CON) 20 MEQ tablet TAKE 1  TABLET BY MOUTH DAILY 30 tablet 6  . predniSONE (DELTASONE) 20 MG tablet 2 tabs po qd x 5d, then 1 tab po qd x 5d 15 tablet 0  . spironolactone (ALDACTONE) 25 MG tablet Take 1 tablet (25 mg total) by mouth daily. 30 tablet 6  . XARELTO 20 MG TABS tablet TAKE 1 TABLET BY MOUTH DAILY WITH SUPPER 30 tablet 6   No current facility-administered medications on file prior to visit.    Past Medical History  Diagnosis Date  . DIABETES MELLITUS, TYPE II 12/31/2007  . ANXIETY 12/31/2007  . DEPRESSION 12/31/2007  . HYPERTENSION 12/31/2007  . ALLERGIC RHINITIS 12/31/2007  . Right knee DJD   . Rosacea 10/11/2010  . Diastolic dysfunction 10/11/2010  . Atrial fibrillation   . Migraine   . PNA (pneumonia)     in her 68s  . Hyperlipidemia 04/23/2012    Review of Systems  Constitutional: Positive for activity change, appetite change and fatigue. Negative for fever and chills.  HENT: Negative for congestion and sore throat.   Respiratory: Negative for cough.   Endocrine: Negative for cold intolerance and heat intolerance.  Neurological: Negative for headaches.      Objective:    BP 110/78 mmHg  Pulse 114  Temp(Src) 97.9 F (36.6 C) (Oral)  Resp 18  Ht 6' (1.829 m)  Wt 335 lb 12.8 oz (152.318 kg)  BMI 45.53 kg/m2  SpO2 96% Nursing note and vital signs reviewed.  Physical Exam  Constitutional: She is oriented to person, place, and time. She appears well-developed and well-nourished. No distress.  Obese female seated in the chair, appears her stated age and is dressed appropriately for the situation.  HENT:  Right Ear: Hearing, tympanic membrane, external ear and ear canal normal.  Left Ear: Hearing, tympanic membrane, external ear and ear canal normal.  Nose: Nose normal.  Mouth/Throat: Uvula is midline, oropharynx is clear and moist and mucous membranes are normal.  Neck: Neck supple. No thyromegaly present.  Cardiovascular: Normal rate, regular rhythm, normal heart sounds and intact distal  pulses.   Pulmonary/Chest: Effort normal and breath sounds normal.  Neurological: She is alert and oriented to person, place, and time.  Skin: Skin is warm and dry.  Psychiatric: She has a normal mood and affect. Her behavior is normal. Judgment and thought content normal.       Assessment & Plan:

## 2014-09-25 NOTE — Patient Instructions (Addendum)
Thank you for choosing [facility].  Summary/Instructions:  Please stop by the lab on the basement level of the building for your blood work. Your results will be released to MyChart (or called to you) after review, usually within 72 hours after test completion. If any changes need to be made, you will be notified at that same time.  If your symptoms worsen or fail to improve, please contact our office for further instruction, or in case of emergency go directly to the emergency room at the closest medical facility.    Fatigue Fatigue is a feeling of tiredness, lack of energy, lack of motivation, or feeling tired all the time. Having enough rest, good nutrition, and reducing stress will normally reduce fatigue. Consult your caregiver if it persists. The nature of your fatigue will help your caregiver to find out its cause. The treatment is based on the cause.  CAUSES  There are many causes for fatigue. Most of the time, fatigue can be traced to one or more of your habits or routines. Most causes fit into one or more of three general areas. They are: Lifestyle problems  Sleep disturbances.  Overwork.  Physical exertion.  Unhealthy habits.  Poor eating habits or eating disorders.  Alcohol and/or drug use .  Lack of proper nutrition (malnutrition). Psychological problems  Stress and/or anxiety problems.  Depression.  Grief.  Boredom. Medical Problems or Conditions  Anemia.  Pregnancy.  Thyroid gland problems.  Recovery from major surgery.  Continuous pain.  Emphysema or asthma that is not well controlled  Allergic conditions.  Diabetes.  Infections (such as mononucleosis).  Obesity.  Sleep disorders, such as sleep apnea.  Heart failure or other heart-related problems.  Cancer.  Kidney disease.  Liver disease.  Effects of certain medicines such as antihistamines, cough and cold remedies, prescription pain medicines, heart and blood pressure  medicines, drugs used for treatment of cancer, and some antidepressants. SYMPTOMS  The symptoms of fatigue include:   Lack of energy.  Lack of drive (motivation).  Drowsiness.  Feeling of indifference to the surroundings. DIAGNOSIS  The details of how you feel help guide your caregiver in finding out what is causing the fatigue. You will be asked about your present and past health condition. It is important to review all medicines that you take, including prescription and non-prescription items. A thorough exam will be done. You will be questioned about your feelings, habits, and normal lifestyle. Your caregiver may suggest blood tests, urine tests, or other tests to look for common medical causes of fatigue.  TREATMENT  Fatigue is treated by correcting the underlying cause. For example, if you have continuous pain or depression, treating these causes will improve how you feel. Similarly, adjusting the dose of certain medicines will help in reducing fatigue.  HOME CARE INSTRUCTIONS   Try to get the required amount of good sleep every night.  Eat a healthy and nutritious diet, and drink enough water throughout the day.  Practice ways of relaxing (including yoga or meditation).  Exercise regularly.  Make plans to change situations that cause stress. Act on those plans so that stresses decrease over time. Keep your work and personal routine reasonable.  Avoid street drugs and minimize use of alcohol.  Start taking a daily multivitamin after consulting your caregiver. SEEK MEDICAL CARE IF:   You have persistent tiredness, which cannot be accounted for.  You have fever.  You have unintentional weight loss.  You have headaches.  You have disturbed sleep   throughout the night.  You are feeling sad.  You have constipation.  You have dry skin.  You have gained weight.  You are taking any new or different medicines that you suspect are causing fatigue.  You are unable to  sleep at night.  You develop any unusual swelling of your legs or other parts of your body. SEEK IMMEDIATE MEDICAL CARE IF:   You are feeling confused.  Your vision is blurred.  You feel faint or pass out.  You develop severe headache.  You develop severe abdominal, pelvic, or back pain.  You develop chest pain, shortness of breath, or an irregular or fast heartbeat.  You are unable to pass a normal amount of urine.  You develop abnormal bleeding such as bleeding from the rectum or you vomit blood.  You have thoughts about harming yourself or committing suicide.  You are worried that you might harm someone else. MAKE SURE YOU:   Understand these instructions.  Will watch your condition.  Will get help right away if you are not doing well or get worse. Document Released: 03/23/2007 Document Revised: 08/18/2011 Document Reviewed: 09/27/2013 Exit[employer] Patient Information 2015 [employer]. This information is not intended to replace advice given to you by your health care provider. Make sure you discuss any questions you have with your health care provider.  

## 2014-09-26 ENCOUNTER — Telehealth: Payer: Self-pay | Admitting: Family

## 2014-09-26 LAB — TSH: TSH: 2.93 u[IU]/mL (ref 0.35–4.50)

## 2014-09-26 NOTE — Telephone Encounter (Signed)
Patient called back

## 2014-09-26 NOTE — Telephone Encounter (Signed)
LVM for pt to call back.

## 2014-09-26 NOTE — Telephone Encounter (Signed)
Pt aware of results. How soon is she to follow up with Dr. Jonny RuizJohn on her diabetes?

## 2014-09-26 NOTE — Telephone Encounter (Signed)
She can plan to follow up in a couple of weeks after increasing the metformin to have her sugars checked.

## 2014-09-26 NOTE — Telephone Encounter (Signed)
Please inform the patient that her thyroid is normal, however her iron levels are low and her A1c is elevated. While the labs indicate iron deficiency, she does not have anemia. For this I recommend starting an iron supplement of Ferrous Sulfate 325 mg daily to help correct her iron deficiency. Second her A1c is 10.8 indicating her diabetes is uncontrolled. Therefore if she is taking her medication as prescribed, I would like for her to increase her metformin to 1,000 mg twice per day and schedule an appointment to follow up with Dr. Jonny RuizJohn about her diabetes. Both the iron deficiency and the diabetes are most likely contributing the fatigue she is feeling.

## 2014-09-27 NOTE — Telephone Encounter (Signed)
Yes, 1,000 mg 2 times daily.

## 2014-09-27 NOTE — Telephone Encounter (Signed)
The dosage of metformin that the pt is on is 500 mg and she takes one in the morning and one in the evening already. Are you suggesting that she take 1000 mg bid? Just wanted to clarify

## 2014-09-27 NOTE — Telephone Encounter (Signed)
LVM letting pt know.  

## 2014-10-02 ENCOUNTER — Telehealth: Payer: Self-pay | Admitting: Internal Medicine

## 2014-10-02 MED ORDER — METFORMIN HCL 1000 MG PO TABS
1000.0000 mg | ORAL_TABLET | Freq: Two times a day (BID) | ORAL | Status: DC
Start: 2014-10-02 — End: 2015-09-21

## 2014-10-02 NOTE — Telephone Encounter (Signed)
Pt called in and stated that Tammy SoursGreg increased her metformin and she is needs a new script with new dosage sent to pharmacy     [facility] - on   [address]  [phone]

## 2014-10-02 NOTE — Telephone Encounter (Signed)
Sent updated script to [facility]...Raechel Chute/lmb

## 2014-10-17 ENCOUNTER — Ambulatory Visit (INDEPENDENT_AMBULATORY_CARE_PROVIDER_SITE_OTHER): Payer: Managed Care, Other (non HMO) | Admitting: Internal Medicine

## 2014-10-17 ENCOUNTER — Encounter: Payer: Self-pay | Admitting: Internal Medicine

## 2014-10-17 VITALS — BP 118/82 | HR 94 | Temp 98.7°F | Resp 18 | Ht 72.0 in | Wt 343.0 lb

## 2014-10-17 DIAGNOSIS — E119 Type 2 diabetes mellitus without complications: Secondary | ICD-10-CM

## 2014-10-17 DIAGNOSIS — Z0000 Encounter for general adult medical examination without abnormal findings: Secondary | ICD-10-CM | POA: Diagnosis not present

## 2014-10-17 NOTE — Patient Instructions (Signed)
Please continue all other medications as before, and refills have been done if requested.  Please have the pharmacy call with any other refills you may need.  Please continue your efforts at being more active, low cholesterol diet, and weight control.  You are otherwise up to date with prevention measures today.  Please keep your appointments with your specialists as you may have planned  Please return in 6 months, or sooner if needed, with Lab testing done 3-5 days before  

## 2014-10-17 NOTE — Progress Notes (Signed)
Subjective:    Patient ID: Pamela [name], female    DOB: 11-Feb-1956, 59 y.o.   MRN: [id number]  HPI    Here for wellness and f/u;  Overall doing ok;  Pt denies Chest pain, worsening SOB, DOE, wheezing, orthopnea, PND, worsening LE edema, palpitations, dizziness or syncope.  Pt denies neurological change such as new headache, facial or extremity weakness.  Pt denies polydipsia, polyuria, or low sugar symptoms. Pt states overall good compliance with treatment and medications, good tolerability, and has been trying to follow appropriate diet.  Pt denies worsening depressive symptoms, suicidal ideation or panic. No fever, night sweats, wt loss, loss of appetite, or other constitutional symptoms.  Pt states good ability with ADL's, has low fall risk, home safety reviewed and adequate, no other significant changes in hearing or vision, and only occasionally active with exercise.  Does not want to check sugars at home.  Tolerating the increased metformin Wt Readings from Last 3 Encounters:  10/17/14 343 lb (155.584 kg)  09/25/14 335 lb 12.8 oz (152.318 kg)  03/06/14 347 lb 1.9 oz (157.453 kg)   Past Medical History  Diagnosis Date  . DIABETES MELLITUS, TYPE II 12/31/2007  . ANXIETY 12/31/2007  . DEPRESSION 12/31/2007  . HYPERTENSION 12/31/2007  . ALLERGIC RHINITIS 12/31/2007  . Right knee DJD   . Rosacea 10/11/2010  . Diastolic dysfunction 10/11/2010  . Atrial fibrillation   . Migraine   . PNA (pneumonia)     in her 3520s  . Hyperlipidemia 04/23/2012   Past Surgical History  Procedure Laterality Date  . 2 d&c      1980s    reports that she has never smoked. She does not have any smokeless tobacco history on file. She reports that she drinks alcohol. She reports that she does not use illicit drugs. family history includes Arthritis in her other; Cancer in her other; Diabetes in her other; Heart disease in her other; Mental illness in her other; Stroke in her other. Allergies  Allergen Reactions  .  Cymbalta [Duloxetine Hcl]     "loopiness"  . Gabapentin Itching   Current Outpatient Prescriptions on File Prior to Visit  Medication Sig Dispense Refill  . atorvastatin (LIPITOR) 10 MG tablet TAKE 1 TABLET BY MOUTH DAILY 90 tablet 1  . cetirizine (ZYRTEC) 10 MG tablet Take 1 tablet (10 mg total) by mouth daily. 30 tablet 11  . diltiazem (CARDIZEM CD) 360 MG 24 hr capsule Take 1 capsule (360 mg total) by mouth daily. 30 capsule 3  . fluticasone (FLONASE) 50 MCG/ACT nasal spray USE 2 SPRAYS IN EACH NOSTRIL EVERY DAY 16 g 11  . furosemide (LASIX) 20 MG tablet TAKE 2 TABLETS BY MOUTH EVERY MORNING AND 1 EVERY EVENING 90 tablet 3  . glimepiride (AMARYL) 2 MG tablet TAKE 1 TABLET BY MOUTH DAILY WITH BREAKFAST 180 tablet 1  . metFORMIN (GLUCOPHAGE) 1000 MG tablet Take 1 tablet (1,000 mg total) by mouth 2 (two) times daily with a meal. 60 tablet 11  . metoprolol succinate (TOPROL-XL) 25 MG 24 hr tablet TAKE 1 TABLET BY MOUTH TWICE DAILY 180 tablet 6  . potassium chloride SA (K-DUR,KLOR-CON) 20 MEQ tablet TAKE 1 TABLET BY MOUTH DAILY 30 tablet 6  . predniSONE (DELTASONE) 20 MG tablet 2 tabs po qd x 5d, then 1 tab po qd x 5d 15 tablet 0  . spironolactone (ALDACTONE) 25 MG tablet Take 1 tablet (25 mg total) by mouth daily. 30 tablet 6  . Carlena HurlXARELTO  20 MG TABS tablet TAKE 1 TABLET BY MOUTH DAILY WITH SUPPER 30 tablet 6   No current facility-administered medications on file prior to visit.     Review of Systems Constitutional: Negative for increased diaphoresis, other activity, appetite or siginficant weight change other than noted HENT: Negative for worsening hearing loss, ear pain, facial swelling, mouth sores and neck stiffness.   Eyes: Negative for other worsening pain, redness or visual disturbance.  Respiratory: Negative for shortness of breath and wheezing  Cardiovascular: Negative for chest pain and palpitations.  Gastrointestinal: Negative for diarrhea, blood in stool, abdominal distention or  other pain Genitourinary: Negative for hematuria, flank pain or change in urine volume.  Musculoskeletal: Negative for myalgias or other joint complaints.  Skin: Negative for color change and wound or drainage.  Neurological: Negative for syncope and numbness. other than noted Hematological: Negative for adenopathy. or other swelling Psychiatric/Behavioral: Negative for hallucinations, SI, self-injury, decreased concentration or other worsening agitation.      Objective:   Physical Exam BP 118/82 mmHg  Pulse 94  Temp(Src) 98.7 F (37.1 C) (Oral)  Resp 18  Ht 6' (1.829 m)  Wt 343 lb (155.584 kg)  BMI 46.51 kg/m2  SpO2 96%  LMP 09/08/2010 VS noted,  Constitutional: Pt is oriented to person, place, and time. Appears well-developed and well-nourished, in no significant distress Head: Normocephalic and atraumatic.  Right Ear: External ear normal.  Left Ear: External ear normal.  Nose: Nose normal.  Mouth/Throat: Oropharynx is clear and moist.  Eyes: Conjunctivae and EOM are normal. Pupils are equal, round, and reactive to light.  Neck: Normal range of motion. Neck supple. No JVD present. No tracheal deviation present or significant neck LA or mass Cardiovascular: Normal rate, regular rhythm, normal heart sounds and intact distal pulses.   Pulmonary/Chest: Effort normal and breath sounds without rales or wheezing  Abdominal: Soft. Bowel sounds are normal. NT. No HSM  Musculoskeletal: Normal range of motion. Exhibits no edema.  Lymphadenopathy:  Has no cervical adenopathy.  Neurological: Pt is alert and oriented to person, place, and time. Pt has normal reflexes. No cranial nerve deficit. Motor grossly intact Skin: Skin is warm and dry. No rash noted.  Psychiatric:  Has normal mood and affect. Behavior is normal.        Assessment & Plan:

## 2014-10-17 NOTE — Progress Notes (Signed)
Pre visit review using our clinic review tool, if applicable. No additional management support is needed unless otherwise documented below in the visit note. 

## 2014-10-17 NOTE — Assessment & Plan Note (Signed)

## 2014-10-17 NOTE — Assessment & Plan Note (Signed)
Uncontrolled, some sympt improved in increased metformin, declines check cbg's and DM education referral,  Lab Results  Component Value Date   HGBA1C 10.8* 09/25/2014   F/u next visit, work on diet/wt loss

## 2014-10-24 ENCOUNTER — Other Ambulatory Visit: Payer: Self-pay | Admitting: Internal Medicine

## 2014-11-13 ENCOUNTER — Encounter: Payer: Self-pay | Admitting: Family Medicine

## 2014-11-13 ENCOUNTER — Ambulatory Visit (INDEPENDENT_AMBULATORY_CARE_PROVIDER_SITE_OTHER): Payer: Managed Care, Other (non HMO) | Admitting: Family Medicine

## 2014-11-13 VITALS — BP 124/84 | HR 90 | Temp 98.5°F | Wt 337.0 lb

## 2014-11-13 DIAGNOSIS — J069 Acute upper respiratory infection, unspecified: Secondary | ICD-10-CM

## 2014-11-13 DIAGNOSIS — H1089 Other conjunctivitis: Secondary | ICD-10-CM | POA: Diagnosis not present

## 2014-11-13 DIAGNOSIS — A499 Bacterial infection, unspecified: Secondary | ICD-10-CM

## 2014-11-13 DIAGNOSIS — H109 Unspecified conjunctivitis: Secondary | ICD-10-CM

## 2014-11-13 MED ORDER — GUAIFENESIN-CODEINE 100-10 MG/5ML PO SOLN
2.5000 mL | Freq: Four times a day (QID) | ORAL | Status: DC | PRN
Start: 2014-11-13 — End: 2015-04-19

## 2014-11-13 MED ORDER — ERYTHROMYCIN 5 MG/GM OP OINT: 1.0000 "application " | TOPICAL_OINTMENT | Freq: Every day | OPHTHALMIC | Status: DC

## 2014-11-13 NOTE — Progress Notes (Signed)
PCP: Oliver BarreJames John, MD  Subjective:  Pamela GooLauren E [name] is a 59 y.o. year old very pleasant female patient who presents with Upper Respiratory infection syptoms including nasal congestion, sore throat, cough   Started with sore throat Tuesday of last week. Person across from her has been sick with bad cold. Thursday went home from works. Pain with swallowing. Temperature running high 99.5. Thought she was getting better from sore throat on Friday then Started coughing with white phlegm now yellowy green. Eye lashes sticking together since then. Runny nose. Sleeping in recliner but not SOB if lies flat. Coughing fits. Tried some OTC cold/cough medicine.   Eyes have been crusted over with yellow green discharge and have been very red. Denies blurry vision or more than mild discomfort. Denies flu exposure.   ROS-denies fever, SOB different from normal (has some SOB at baseline), NV, tooth pain. Some diarrhea. No sinus pressure.   Pertinent Past Medical History- diabetes poorly controlled with a1c 10.8, depression/anxiety, a fib, chronic diastolic CHF, CKD stage III  Medications- reviewed  Current Outpatient Prescriptions  Medication Sig Dispense Refill  . atorvastatin (LIPITOR) 10 MG tablet TAKE 1 TABLET BY MOUTH EVERY DAY 90 tablet 3  . cetirizine (ZYRTEC) 10 MG tablet Take 1 tablet (10 mg total) by mouth daily. 30 tablet 11  . diltiazem (CARDIZEM CD) 360 MG 24 hr capsule Take 1 capsule (360 mg total) by mouth daily. 30 capsule 3  . furosemide (LASIX) 20 MG tablet TAKE 2 TABLETS BY MOUTH EVERY MORNING AND 1 EVERY EVENING 90 tablet 3  . glimepiride (AMARYL) 2 MG tablet TAKE 1 TABLET BY MOUTH DAILY WITH BREAKFAST 180 tablet 1  . metFORMIN (GLUCOPHAGE) 1000 MG tablet Take 1 tablet (1,000 mg total) by mouth 2 (two) times daily with a meal. 60 tablet 11  . metoprolol succinate (TOPROL-XL) 25 MG 24 hr tablet TAKE 1 TABLET BY MOUTH TWICE DAILY 180 tablet 6  . potassium chloride SA (K-DUR,KLOR-CON) 20 MEQ  tablet TAKE 1 TABLET BY MOUTH DAILY 30 tablet 6  . spironolactone (ALDACTONE) 25 MG tablet Take 1 tablet (25 mg total) by mouth daily. 30 tablet 6  . XARELTO 20 MG TABS tablet TAKE 1 TABLET BY MOUTH DAILY WITH SUPPER 30 tablet 6  . fluticasone (FLONASE) 50 MCG/ACT nasal spray USE 2 SPRAYS IN EACH NOSTRIL EVERY DAY (Patient not taking: Reported on 11/13/2014) 16 g 11   Objective: BP 124/84 mmHg  Pulse 106  Temp(Src) 98.5 F (36.9 C)  Wt 337 lb (152.862 kg)  SpO2 95%  LMP 09/08/2010 Gen: NAD, resting comfortably HEENT: Turbinates erythematous with yellow discharge, TM normal, pharynx mildly erythematous with no tonsilar exudate or edema, no sinus tenderness.  Eyes: bilateral severe erythema conjunctiva as well as some in sclera, mainly clear but some yellow discharge as well.  CV: irregularly irregular no murmurs rubs or gallops Lungs: CTAB no crackles, wheeze, rhonchi Abdomen: soft/nontender/nondistended/normal bowel sounds. Morbidly obese Ext: trace edema Skin: warm, dry, no rash   Assessment/Plan:  Upper Respiratory infection Day 7 of illness and we discussed strict return criteria and also see AVS. Symptomatic care with codeine cough syrup. History and exam today are suggestive of viral URI.  We discussed that a serious infection or illness is unlikely. We also discussed other potential etiologies none suggestive of bacterial infection at this time. We discussed treatment side effects, likely course, antibiotic misuse, transmission, and signs of developing a serious illness.   Also seems to have developed bacterial conjunctivitis and  will treat with erythromycin  Meds ordered this encounter  Medications  . erythromycin ophthalmic ointment    Sig: Place 1 application into both eyes at bedtime. One-half inch (1.25 cm) four times daily for 5 to 7 days to affected eye    Dispense:  3.5 g    Refill:  0  . guaiFENesin-codeine 100-10 MG/5ML syrup    Sig: Take 2.5-5 mLs by mouth every 6  (six) hours as needed for cough.    Dispense:  120 mL    Refill:  0

## 2014-11-13 NOTE — Patient Instructions (Signed)
Upper respiratory infection  Follow up if fevers, no improvement of symptoms within a week, new or worsening symptoms  Codeine cough syrup. Don't drive until at least 8 hours after taking.   Bacterial conjunctivitis  Treat with eye ointment for at least 5 days  If blurry vision or if does not improve within 7 days, see Dr. Jonny RuizJohn or your eye doctor  Hold off on work until Thursday unless feel significantly better

## 2014-11-17 ENCOUNTER — Encounter: Payer: Self-pay | Admitting: Family

## 2014-11-17 ENCOUNTER — Ambulatory Visit (INDEPENDENT_AMBULATORY_CARE_PROVIDER_SITE_OTHER): Payer: Managed Care, Other (non HMO) | Admitting: Family

## 2014-11-17 VITALS — BP 104/78 | HR 105 | Temp 98.1°F | Resp 18 | Ht 72.0 in | Wt 330.0 lb

## 2014-11-17 DIAGNOSIS — R05 Cough: Secondary | ICD-10-CM

## 2014-11-17 DIAGNOSIS — R059 Cough, unspecified: Secondary | ICD-10-CM

## 2014-11-17 MED ORDER — CEFUROXIME AXETIL 250 MG PO TABS: 250.0000 mg | ORAL_TABLET | Freq: Two times a day (BID) | ORAL | Status: DC

## 2014-11-17 NOTE — Progress Notes (Signed)
Subjective:    Patient ID: Pamela [name], female    DOB: [birth year], 59 y.o.   MRN: [id number]  Chief Complaint  Patient presents with  . Cough    having coughing and feels really bad, has had diarrhea and nausea    HPI:  Pamela [name] is a 59 y.o. female with a PMH of diabetes, anxiety, depression, hypertension, atrial fibrillation, migraines, peripheral neuropathy, renal insufficiency, and hyperlipidemia who presents today for an acute office visit.  Was recently seen in the office and diagnosed with an upper respiratory infection and conjunctivitis. Was treated with erythromycin eye drops and cough syrup with codeine. Since being seen on Monday, she continues to experience the associated symptoms of cough, diarrhea and nausea. Does note there is some improvement in her symptoms, however does not feel better overall.    Allergies  Allergen Reactions  . Cymbalta [Duloxetine Hcl]     "loopiness"  . Gabapentin Itching    Current Outpatient Prescriptions on File Prior to Visit  Medication Sig Dispense Refill  . atorvastatin (LIPITOR) 10 MG tablet TAKE 1 TABLET BY MOUTH EVERY DAY 90 tablet 3  . cetirizine (ZYRTEC) 10 MG tablet Take 1 tablet (10 mg total) by mouth daily. 30 tablet 11  . diltiazem (CARDIZEM CD) 360 MG 24 hr capsule Take 1 capsule (360 mg total) by mouth daily. 30 capsule 3  . erythromycin ophthalmic ointment Place 1 application into both eyes at bedtime. One-half inch (1.25 cm) four times daily for 5 to 7 days to affected eye 3.5 g 0  . fluticasone (FLONASE) 50 MCG/ACT nasal spray USE 2 SPRAYS IN EACH NOSTRIL EVERY DAY 16 g 11  . furosemide (LASIX) 20 MG tablet TAKE 2 TABLETS BY MOUTH EVERY MORNING AND 1 EVERY EVENING 90 tablet 3  . glimepiride (AMARYL) 2 MG tablet TAKE 1 TABLET BY MOUTH DAILY WITH BREAKFAST 180 tablet 1  . guaiFENesin-codeine 100-10 MG/5ML syrup Take 2.5-5 mLs by mouth every 6 (six) hours as needed for cough. 120 mL 0  . metFORMIN (GLUCOPHAGE) 1000  MG tablet Take 1 tablet (1,000 mg total) by mouth 2 (two) times daily with a meal. 60 tablet 11  . metoprolol succinate (TOPROL-XL) 25 MG 24 hr tablet TAKE 1 TABLET BY MOUTH TWICE DAILY 180 tablet 6  . potassium chloride SA (K-DUR,KLOR-CON) 20 MEQ tablet TAKE 1 TABLET BY MOUTH DAILY 30 tablet 6  . spironolactone (ALDACTONE) 25 MG tablet Take 1 tablet (25 mg total) by mouth daily. 30 tablet 6  . XARELTO 20 MG TABS tablet TAKE 1 TABLET BY MOUTH DAILY WITH SUPPER 30 tablet 6   No current facility-administered medications on file prior to visit.    Review of Systems  Constitutional: Negative for fever and chills.  HENT: Positive for congestion.   Respiratory: Positive for cough.       Objective:    BP 104/78 mmHg  Pulse 105  Temp(Src) 98.1 F (36.7 C) (Oral)  Resp 18  Ht 6' (1.829 m)  Wt 330 lb (149.687 kg)  BMI 44.75 kg/m2  SpO2 95%  LMP 09/08/2010 Nursing note and vital signs reviewed.  Physical Exam  Constitutional: She is oriented to person, place, and time. She appears well-developed and well-nourished. No distress.  HENT:  Right Ear: Hearing, tympanic membrane, external ear and ear canal normal.  Left Ear: Hearing, tympanic membrane, external ear and ear canal normal.  Nose: Nose normal. Right sinus exhibits no maxillary sinus tenderness and no frontal sinus tenderness.  Left sinus exhibits no maxillary sinus tenderness and no frontal sinus tenderness.  Mouth/Throat: Oropharynx is clear and moist and mucous membranes are normal.  Cardiovascular: Normal rate, regular rhythm, normal heart sounds and intact distal pulses.   Pulmonary/Chest: Effort normal and breath sounds normal.  Neurological: She is alert and oriented to person, place, and time.  Skin: Skin is warm and dry.  Psychiatric: She has a normal mood and affect. Her behavior is normal. Judgment and thought content normal.       Assessment & Plan:   Problem List Items Addressed This Visit      Other   Cough -  Primary    Symptoms and exam consistent with bronchitis. Start Ceftin. Continue over the counter mediation as needed for symptom relief and supportive care. Follow up if symptoms worsen or fail to improve.       Relevant Medications   cefUROXime (CEFTIN) 250 MG tablet

## 2014-11-17 NOTE — Progress Notes (Signed)
Pre visit review using our clinic review tool, if applicable. No additional management support is needed unless otherwise documented below in the visit note. 

## 2014-11-17 NOTE — Assessment & Plan Note (Signed)
Symptoms and exam consistent with bronchitis. Start Ceftin. Continue over the counter mediation as needed for symptom relief and supportive care. Follow up if symptoms worsen or fail to improve.

## 2014-11-17 NOTE — Patient Instructions (Addendum)
Thank you for choosing [facility].  Summary/Instructions:  Your prescription(s) have been submitted to your pharmacy or been printed and provided for you. Please take as directed and contact our office if you believe you are having problem(s) with the medication(s) or have any questions.  If your symptoms worsen or fail to improve, please contact our office for further instruction, or in case of emergency go directly to the emergency room at the closest medical facility.   General Recommendations:    Please drink plenty of fluids.  Get plenty of rest   Sleep in humidified air  Use saline nasal sprays  Netti pot   OTC Medications:  Decongestants - helps relieve congestion   Flonase (generic fluticasone) or Nasacort (generic triamcinolone) - please make sure to use the "cross-over" technique at a 45 degree angle towards the opposite eye as opposed to straight up the nasal passageway.   Sudafed (generic pseudoephedrine - Note this is the one that is available behind the pharmacy counter); Products with phenylephrine (-PE) may also be used but is often not as effective as pseudoephedrine.   If you have HIGH BLOOD PRESSURE - Coricidin HBP; AVOID any product that is -D as this contains pseudoephedrine which may increase your blood pressure.  Afrin (oxymetazoline) every 6-8 hours for up to 3 days.   Allergies - helps relieve runny nose, itchy eyes and sneezing   Claritin (generic loratidine), Allegra (fexofenidine), or Zyrtec (generic cyrterizine) for runny nose. These medications should not cause drowsiness.  Note - Benadryl (generic diphenhydramine) may be used however may cause drowsiness  Cough -   Delsym or Robitussin (generic dextromethorphan)  Expectorants - helps loosen mucus to ease removal   Mucinex (generic guaifenesin) as directed on the package.  Headaches / General Aches   Tylenol (generic acetaminophen) - DO NOT EXCEED 3 grams (3,000 mg) in a 24  hour time period  Advil/Motrin (generic ibuprofen)   Sore Throat -   Salt water gargle   Chloraseptic (generic benzocaine) spray or lozenges / Sucrets (generic dyclonine)       Acute Bronchitis Bronchitis is inflammation of the airways that extend from the windpipe into the lungs (bronchi). The inflammation often causes mucus to develop. This leads to a cough, which is the most common symptom of bronchitis.  In acute bronchitis, the condition usually develops suddenly and goes away over time, usually in a couple weeks. Smoking, allergies, and asthma can make bronchitis worse. Repeated episodes of bronchitis may cause further lung problems.  CAUSES Acute bronchitis is most often caused by the same virus that causes a cold. The virus can spread from person to person (contagious) through coughing, sneezing, and touching contaminated objects. SIGNS AND SYMPTOMS  4. Cough.  5. Fever.  6. Coughing up mucus.  7. Body aches.  8. Chest congestion.  9. Chills.  10. Shortness of breath.  11. Sore throat.  DIAGNOSIS  Acute bronchitis is usually diagnosed through a physical exam. Your health care provider will also ask you questions about your medical history. Tests, such as chest X-rays, are sometimes done to rule out other conditions.  TREATMENT  Acute bronchitis usually goes away in a couple weeks. Oftentimes, no medical treatment is necessary. Medicines are sometimes given for relief of fever or cough. Antibiotic medicines are usually not needed but may be prescribed in certain situations. In some cases, an inhaler may be recommended to help reduce shortness of breath and control the cough. A cool mist vaporizer may also be  used to help thin bronchial secretions and make it easier to clear the chest.  HOME CARE INSTRUCTIONS 2. Get plenty of rest.  3. Drink enough fluids to keep your urine clear or pale yellow (unless you have a medical condition that requires fluid restriction).  Increasing fluids may help thin your respiratory secretions (sputum) and reduce chest congestion, and it will prevent dehydration.  4. Take medicines only as directed by your health care provider. 5. If you were prescribed an antibiotic medicine, finish it all even if you start to feel better. 6. Avoid smoking and secondhand smoke. Exposure to cigarette smoke or irritating chemicals will make bronchitis worse. If you are a smoker, consider using nicotine gum or skin patches to help control withdrawal symptoms. Quitting smoking will help your lungs heal faster.  7. Reduce the chances of another bout of acute bronchitis by washing your hands frequently, avoiding people with cold symptoms, and trying not to touch your hands to your mouth, nose, or eyes.  8. Keep all follow-up visits as directed by your health care provider.  SEEK MEDICAL CARE IF: Your symptoms do not improve after 1 week of treatment.  SEEK IMMEDIATE MEDICAL CARE IF: 2. You develop an increased fever or chills.  3. You have chest pain.  4. You have severe shortness of breath. 5. You have bloody sputum.  6. You develop dehydration. 7. You faint or repeatedly feel like you are going to pass out. 8. You develop repeated vomiting. 9. You develop a severe headache. MAKE SURE YOU:  3. Understand these instructions. 4. Will watch your condition. 5. Will get help right away if you are not doing well or get worse. Document Released: 07/03/2004 Document Revised: 10/10/2013 Document Reviewed: 11/16/2012 [facility] Patient Information 2015 [place], [place]. This information is not intended to replace advice given to you by your health care provider. Make sure you discuss any questions you have with your health care provider.

## 2014-11-24 ENCOUNTER — Telehealth: Payer: Self-pay | Admitting: Emergency Medicine

## 2014-11-24 NOTE — Telephone Encounter (Signed)
LVM for pt to call back and update mammogram records.

## 2014-11-25 ENCOUNTER — Other Ambulatory Visit (HOSPITAL_COMMUNITY): Payer: Self-pay | Admitting: Internal Medicine

## 2014-11-28 NOTE — Telephone Encounter (Signed)
Refill request for Dr Benshimon 

## 2014-12-23 ENCOUNTER — Other Ambulatory Visit (HOSPITAL_COMMUNITY): Payer: Self-pay | Admitting: Internal Medicine

## 2014-12-27 ENCOUNTER — Other Ambulatory Visit (HOSPITAL_COMMUNITY): Payer: Self-pay | Admitting: Internal Medicine

## 2015-01-23 ENCOUNTER — Other Ambulatory Visit (HOSPITAL_COMMUNITY): Payer: Self-pay | Admitting: Internal Medicine

## 2015-01-31 ENCOUNTER — Other Ambulatory Visit: Payer: Self-pay

## 2015-01-31 MED ORDER — CETIRIZINE HCL 10 MG PO TABS: 10.0000 mg | ORAL_TABLET | Freq: Every day | ORAL | Status: DC

## 2015-02-22 ENCOUNTER — Other Ambulatory Visit (HOSPITAL_COMMUNITY): Payer: Self-pay | Admitting: Internal Medicine

## 2015-03-23 ENCOUNTER — Telehealth: Payer: Self-pay | Admitting: Internal Medicine

## 2015-03-23 NOTE — Telephone Encounter (Signed)
TELEPHONE ADVICE RECORD [facility]  Patient Name: Pamela [name]  DOB: 14-Sep-1955    Initial Comment Caller states right ankle hurts, does not know cause. Swollen and pain for a week.   Nurse Assessment  Nurse: Odis LusterBowers, RN, Bjorn Loserhonda Date/Time Lamount Cohen(Eastern Time): 03/23/2015 1:33:07 PM  Confirm and document reason for call. If symptomatic, describe symptoms. ---Caller states right ankle hurts, does not know cause. Swollen and pain for a week. Reports that she walks alot at work. Reports that she has a limp. Reports osteoarthritis in her knees, but she isn't sure of an injury. Reports that the swelling prohibits her from seeing her ankle bone. Rates pain 4-5/10, didn't work for the past couple of days to rest the ankle and elevate. This hasn't changed things.  Has the patient traveled out of the country within the last 30 days? ---No  Does the patient have any new or worsening symptoms? ---Yes  Will a triage be completed? ---Yes  Related visit to physician within the last 2 weeks? ---No  Does the PT have any chronic conditions? (i.e. diabetes, asthma, etc.) ---Yes  List chronic conditions. ---osteoarthritis, a-fib, type 2 diabetic     Guidelines    Guideline Title Affirmed Question Affirmed Notes  Leg Swelling and Edema [1] Thigh, calf, or ankle swelling AND [2] only 1 side    Final Disposition User   See Physician within 4 Hours (or PCP triage) Odis LusterBowers, RN, Bjorn Loserhonda    Comments  Ad[facility] caller [facility] since [facility] doesn't have access to X-rays. Caller voiced understanding.   Disagree/Comply: Comply

## 2015-03-24 ENCOUNTER — Other Ambulatory Visit (HOSPITAL_COMMUNITY): Payer: Self-pay | Admitting: Internal Medicine

## 2015-03-28 ENCOUNTER — Other Ambulatory Visit (HOSPITAL_COMMUNITY): Payer: Self-pay | Admitting: Internal Medicine

## 2015-04-12 ENCOUNTER — Other Ambulatory Visit (INDEPENDENT_AMBULATORY_CARE_PROVIDER_SITE_OTHER): Payer: Managed Care, Other (non HMO)

## 2015-04-12 DIAGNOSIS — E119 Type 2 diabetes mellitus without complications: Secondary | ICD-10-CM | POA: Diagnosis not present

## 2015-04-12 LAB — HEPATIC FUNCTION PANEL
ALBUMIN: 4 g/dL (ref 3.5–5.2)
ALT: 15 U/L (ref 0–35)
AST: 11 U/L (ref 0–37)
Alkaline Phosphatase: 73 U/L (ref 39–117)
Bilirubin, Direct: 0.1 mg/dL (ref 0.0–0.3)
TOTAL PROTEIN: 7.7 g/dL (ref 6.0–8.3)
Total Bilirubin: 0.3 mg/dL (ref 0.2–1.2)

## 2015-04-12 LAB — BASIC METABOLIC PANEL
BUN: 19 mg/dL (ref 6–23)
CALCIUM: 9.7 mg/dL (ref 8.4–10.5)
CO2: 29 meq/L (ref 19–32)
Chloride: 98 mEq/L (ref 96–112)
Creatinine, Ser: 1.25 mg/dL — ABNORMAL HIGH (ref 0.40–1.20)
GFR: 46.61 mL/min — AB (ref >60.00)
GLUCOSE: 234 mg/dL — AB (ref 70–99)
Potassium: 3.8 mEq/L (ref 3.5–5.1)
Sodium: 136 mEq/L (ref 135–145)

## 2015-04-12 LAB — HEMOGLOBIN A1C: Hgb A1c MFr Bld: 9.8 % — ABNORMAL HIGH (ref 4.6–6.5)

## 2015-04-12 LAB — LIPID PANEL
CHOLESTEROL: 133 mg/dL (ref 0–200)
HDL: 47.2 mg/dL (ref >39.00)
LDL CALC: 53 mg/dL (ref 0–99)
NonHDL: 85.63
TRIGLYCERIDES: 161 mg/dL — AB (ref 0.0–149.0)
Total CHOL/HDL Ratio: 3
VLDL: 32.2 mg/dL (ref 0.0–40.0)

## 2015-04-19 ENCOUNTER — Encounter: Payer: Self-pay | Admitting: Internal Medicine

## 2015-04-19 ENCOUNTER — Ambulatory Visit (HOSPITAL_COMMUNITY)
Admission: RE | Admit: 2015-04-19 | Discharge: 2015-04-19 | Disposition: A | Discharge status: Home / Self Care | Payer: Managed Care, Other (non HMO) | Source: Ambulatory Visit | Attending: Cardiology | Admitting: Cardiology

## 2015-04-19 ENCOUNTER — Ambulatory Visit (INDEPENDENT_AMBULATORY_CARE_PROVIDER_SITE_OTHER)
Admission: RE | Admit: 2015-04-19 | Discharge: 2015-04-19 | Disposition: A | Discharge status: Home / Self Care | Payer: Managed Care, Other (non HMO) | Source: Ambulatory Visit | Attending: Internal Medicine | Admitting: Internal Medicine

## 2015-04-19 ENCOUNTER — Ambulatory Visit (INDEPENDENT_AMBULATORY_CARE_PROVIDER_SITE_OTHER): Payer: Managed Care, Other (non HMO) | Admitting: Internal Medicine

## 2015-04-19 VITALS — BP 138/78 | HR 104 | Wt 343.8 lb

## 2015-04-19 VITALS — BP 128/78 | HR 103 | Temp 98.4°F | Resp 20 | Ht 72.0 in | Wt 343.0 lb

## 2015-04-19 DIAGNOSIS — M25571 Pain in right ankle and joints of right foot: Secondary | ICD-10-CM | POA: Diagnosis not present

## 2015-04-19 DIAGNOSIS — I10 Essential (primary) hypertension: Secondary | ICD-10-CM

## 2015-04-19 DIAGNOSIS — Z79899 Other long term (current) drug therapy: Secondary | ICD-10-CM | POA: Diagnosis not present

## 2015-04-19 DIAGNOSIS — E119 Type 2 diabetes mellitus without complications: Secondary | ICD-10-CM | POA: Insufficient documentation

## 2015-04-19 DIAGNOSIS — Z0189 Encounter for other specified special examinations: Secondary | ICD-10-CM

## 2015-04-19 DIAGNOSIS — I482 Chronic atrial fibrillation: Secondary | ICD-10-CM | POA: Diagnosis not present

## 2015-04-19 DIAGNOSIS — Z7984 Long term (current) use of oral hypoglycemic drugs: Secondary | ICD-10-CM | POA: Insufficient documentation

## 2015-04-19 DIAGNOSIS — E785 Hyperlipidemia, unspecified: Secondary | ICD-10-CM

## 2015-04-19 DIAGNOSIS — E669 Obesity, unspecified: Secondary | ICD-10-CM | POA: Insufficient documentation

## 2015-04-19 DIAGNOSIS — I4891 Unspecified atrial fibrillation: Secondary | ICD-10-CM

## 2015-04-19 DIAGNOSIS — I110 Hypertensive heart disease with heart failure: Secondary | ICD-10-CM | POA: Insufficient documentation

## 2015-04-19 DIAGNOSIS — Z23 Encounter for immunization: Secondary | ICD-10-CM | POA: Diagnosis not present

## 2015-04-19 DIAGNOSIS — I5032 Chronic diastolic (congestive) heart failure: Secondary | ICD-10-CM | POA: Insufficient documentation

## 2015-04-19 DIAGNOSIS — Z7902 Long term (current) use of antithrombotics/antiplatelets: Secondary | ICD-10-CM | POA: Diagnosis not present

## 2015-04-19 DIAGNOSIS — Z0000 Encounter for general adult medical examination without abnormal findings: Secondary | ICD-10-CM

## 2015-04-19 MED ORDER — POTASSIUM CHLORIDE CRYS ER 20 MEQ PO TBCR: 20.0000 meq | EXTENDED_RELEASE_TABLET | Freq: Every day | ORAL | Status: DC

## 2015-04-19 MED ORDER — COLCHICINE 0.6 MG PO TABS: 0.6000 mg | ORAL_TABLET | Freq: Two times a day (BID) | ORAL | Status: DC

## 2015-04-19 MED ORDER — METOPROLOL SUCCINATE ER 50 MG PO TB24: 50.0000 mg | ORAL_TABLET | Freq: Two times a day (BID) | ORAL | Status: DC

## 2015-04-19 MED ORDER — GLIPIZIDE ER 10 MG PO TB24: 10.0000 mg | ORAL_TABLET | Freq: Every day | ORAL | Status: DC

## 2015-04-19 NOTE — Patient Instructions (Addendum)
Increase Metoprolol to 50 mg Twice daily   We will contact you in 6 months to schedule your next appointment.

## 2015-04-19 NOTE — Patient Instructions (Addendum)
You had the Tdap tetanus shot today  Ok to stop the amaryl  Please take all new medication as prescribed - the glipizide ER 10 mg per day  Please take all new medication as prescribed  - the colchicine for pain and swelling  Please continue all other medications as before, and refills have been done if requested.  Please have the pharmacy call with any other refills you may need.  Please continue your efforts at being more active, low cholesterol diet, and weight control.  Please make appt with Dr Smith/sports medicine at the checkout desk as you leave  Please keep your appointments with your specialists as you may have planned  Please go to the XRAY Department in the Basement (go straight as you get off the elevator) for the x-ray testing  You will be contacted by phone if any changes need to be made immediately.  Otherwise, you will receive a letter about your results with an explanation, but please check with MyChart first.  Please return in 6 months, or sooner if needed, with Lab testing done 3-5 days before

## 2015-04-19 NOTE — Assessment & Plan Note (Signed)
stable overall by history and exam, recent data reviewed with pt, and pt to continue medical treatment as before,  to f/u any worsening symptoms or concerns Lab Results  Component Value Date   LDLCALC 53 04/12/2015

## 2015-04-19 NOTE — Assessment & Plan Note (Signed)
Suspect severe OA flare, but will need plain film repeat to r/o fx, empiric colchicine bid, and asked to f/u with Dr Smtih/sport medicine in this office

## 2015-04-19 NOTE — Assessment & Plan Note (Signed)
stable overall by history and exam, recent data reviewed with pt, and pt to continue medical treatment as before,  to f/u any worsening symptoms or concerns BP Readings from Last 3 Encounters:  04/19/15 128/78  04/19/15 138/78  11/17/14 104/78

## 2015-04-19 NOTE — Progress Notes (Signed)
Patient ID: Pamela [name], female   DOB: Feb 12, 1956, 59 y.o.   MRN: [id number] PCP: Dr. Jacquelynn CreeJohn  Lindi is a 59 y/o with h/o DM2, obesity, HTN, chronic Afib on Xarelto and chronic diastolic HF, echo 09/2010 with EF 55%.   She underwent cardiac cath in April 2012 which showed normal coronaries. Did very well with rate control and diuresis. Started on coumadin. We saw her in May and switched her to Xarelto due to problems regulating INRs. She underwent DC-CV in June 2012 which failed so we opted for rate control strategy.  At last appointment, she has been stable.  She remains in atrial fibrillation.  Rate remains a bit high today, in the 100s.  Same dyspnea, usually notes if she walks a long distance in the heat.  No orthopnea/PND.  No chest pain.  No lightheadedness/syncope.  No melena/BRBPR.  She has asymmetric swelling of the right ankle but has not missed her Xarelto.  She had venous dopplers done at urgent care this weekend, she has not heard the result yet.    ECG: atrial fibrillation with RVR, poor RWP.   Labs (6/14): Creatinine 1.3, Potassium 4.4, LDL 75, HDL 56, K 4.4, creatinine 1.3 Labs (3/15): K 4, creatinine 1.06, BNP 741 Labs (4/16): hgb 14.7 Labs (11/16): K 3.8, creatinine 1.25, LDL 83, HDL 47  ROS: All systems negative except as listed in HPI, PMH and Problem List.  Past Medical History  Diagnosis Date  . DIABETES MELLITUS, TYPE II 12/31/2007  . ANXIETY 12/31/2007  . DEPRESSION 12/31/2007  . HYPERTENSION 12/31/2007  . ALLERGIC RHINITIS 12/31/2007  . Right knee DJD   . Rosacea 10/11/2010  . Diastolic dysfunction 10/11/2010  . Atrial fibrillation (HCC)   . Migraine   . PNA (pneumonia)     in her 820s  . Hyperlipidemia 04/23/2012    Current Outpatient Prescriptions  Medication Sig Dispense Refill  . atorvastatin (LIPITOR) 10 MG tablet TAKE 1 TABLET BY MOUTH EVERY DAY 90 tablet 3  . cetirizine (ZYRTEC) 10 MG tablet Take 1 tablet (10 mg total) by mouth daily. 30 tablet 11  .  diltiazem (CARDIZEM CD) 360 MG 24 hr capsule TAKE 1 CAPSULE(360 MG) BY MOUTH DAILY 30 capsule 0  . fluticasone (FLONASE) 50 MCG/ACT nasal spray USE 2 SPRAYS IN EACH NOSTRIL EVERY DAY 16 g 11  . furosemide (LASIX) 20 MG tablet TAKE 2 TABLETS BY MOUTH EVERY MORNING, AND TAKE 1 TABLET EVERY EVENING 270 tablet 3  . metFORMIN (GLUCOPHAGE) 1000 MG tablet Take 1 tablet (1,000 mg total) by mouth 2 (two) times daily with a meal. 60 tablet 11  . metoprolol succinate (TOPROL-XL) 50 MG 24 hr tablet Take 1 tablet (50 mg total) by mouth 2 (two) times daily. 180 tablet 3  . potassium chloride SA (K-DUR,KLOR-CON) 20 MEQ tablet Take 1 tablet (20 mEq total) by mouth daily. 30 tablet 6  . spironolactone (ALDACTONE) 25 MG tablet Take 1 tablet (25 mg total) by mouth daily. 30 tablet 6  . XARELTO 20 MG TABS tablet TAKE 1 TABLET BY MOUTH EVERY DAY WITH SUPPER 30 tablet 0  . colchicine 0.6 MG tablet Take 1 tablet (0.6 mg total) by mouth 2 (two) times daily. 60 tablet 2  . glipiZIDE (GLUCOTROL XL) 10 MG 24 hr tablet Take 1 tablet (10 mg total) by mouth daily with breakfast. 90 tablet 3   No current facility-administered medications for this encounter.   PHYSICAL EXAM: Filed Vitals:   04/19/15 0945  BP: 138/78  Pulse: 104  Weight: 343 lb 12 oz (155.924 kg)  SpO2: 95%   General: Obese  Well appearing. No resp difficulty HEENT: normal Neck: supple. JVP 7 cm, Carotids 2+ bilaterally; no bruits. No lymphadenopathy or thryomegaly appreciated. Cor: PMI normal. Mildly tachy, irregular rate & rhythm. No rubs, gallops or murmurs.  No edema. Lungs: clear Abdomen: obese soft, nontender, nondistended.  Good bowel sounds. Extremities: no cyanosis, clubbing, rash. Neuro: alert & orientedx3, cranial nerves grossly intact. Moves all 4 extremities w/o difficulty. Affect pleasant.  ASSESSMENT & PLAN: 1. Atrial fibrillation: Chronic.  Rate remains elevated.  Continue current diltiazem CD.  Increase Toprol XL to 50 mg bid.   Continue Xarelto.   2. Chronic diastolic CHF: NYHA class II symptoms, volume stable.  - Continue Lasix 40 qam, 20 qpm.  - Recent BMET looked ok.   Followup in 6 months.   Marca Ancona 04/19/2015

## 2015-04-19 NOTE — Assessment & Plan Note (Signed)
Persistent uncontrolled, for change amaryl 2 mg daily to glipizide ER 10, with f/u next visit, o/w stable overall by history and exam, recent data reviewed with pt, and pt to continue medical treatment as before,  to f/u any worsening symptoms or concerns Lab Results  Component Value Date   HGBA1C 9.8* 04/12/2015

## 2015-04-19 NOTE — Progress Notes (Signed)
Subjective:    Patient ID: Pamela [name], female    DOB: [birth year], 59 y.o.   MRN: [id number]  HPI  Here to f/u; overall doing ok,  Pt denies chest pain, increasing sob or doe, wheezing, orthopnea, PND, increased LE swelling, palpitations, dizziness or syncope.  Pt denies new neurological symptoms such as new headache, or facial or extremity weakness or numbness.  Pt denies polydipsia, polyuria, or low sugar episode.   Pt denies new neurological symptoms such as new headache, or facial or extremity weakness or numbness.   Pt states overall good compliance with meds, mostly trying to follow appropriate diet, with wt overall stable,  but little exercise however. Wt Readings from Last 3 Encounters:  04/19/15 343 lb (155.584 kg)  04/19/15 343 lb 12 oz (155.924 kg)  11/17/14 330 lb (149.687 kg)  Does have rather large persistent right ankle pain and swelling, without obvious trauma/ mistep, fever, or hx of gout.  No prior hx of same. Was seen at [facility] with reportedly neg plain films and RLE venous doppler neg.  Pain worse to ambulate up to 7/10 every day.  Lives in second floor apartment Past Medical History  Diagnosis Date  . DIABETES MELLITUS, TYPE II 12/31/2007  . ANXIETY 12/31/2007  . DEPRESSION 12/31/2007  . HYPERTENSION 12/31/2007  . ALLERGIC RHINITIS 12/31/2007  . Right knee DJD   . Rosacea 10/11/2010  . Diastolic dysfunction 10/11/2010  . Atrial fibrillation (HCC)   . Migraine   . PNA (pneumonia)     in her 58s  . Hyperlipidemia 04/23/2012   Past Surgical History  Procedure Laterality Date  . 2 d&c      1980s    reports that she has never smoked. She does not have any smokeless tobacco history on file. She reports that she drinks alcohol. She reports that she does not use illicit drugs. family history includes Arthritis in her other; Cancer in her other; Diabetes in her other; Heart disease in her other; Mental illness in her other; Stroke in her other. Allergies  Allergen Reactions  .  Cymbalta [Duloxetine Hcl]     "loopiness"  . Gabapentin Itching   Current Outpatient Prescriptions on File Prior to Visit  Medication Sig Dispense Refill  . atorvastatin (LIPITOR) 10 MG tablet TAKE 1 TABLET BY MOUTH EVERY DAY 90 tablet 3  . cetirizine (ZYRTEC) 10 MG tablet Take 1 tablet (10 mg total) by mouth daily. 30 tablet 11  . diltiazem (CARDIZEM CD) 360 MG 24 hr capsule TAKE 1 CAPSULE(360 MG) BY MOUTH DAILY 30 capsule 0  . fluticasone (FLONASE) 50 MCG/ACT nasal spray USE 2 SPRAYS IN EACH NOSTRIL EVERY DAY 16 g 11  . furosemide (LASIX) 20 MG tablet TAKE 2 TABLETS BY MOUTH EVERY MORNING, AND TAKE 1 TABLET EVERY EVENING 270 tablet 3  . metFORMIN (GLUCOPHAGE) 1000 MG tablet Take 1 tablet (1,000 mg total) by mouth 2 (two) times daily with a meal. 60 tablet 11  . spironolactone (ALDACTONE) 25 MG tablet Take 1 tablet (25 mg total) by mouth daily. 30 tablet 6  . XARELTO 20 MG TABS tablet TAKE 1 TABLET BY MOUTH EVERY DAY WITH SUPPER 30 tablet 0   No current facility-administered medications on file prior to visit.     Review of Systems  Constitutional: Negative for unusual diaphoresis or night sweats HENT: Negative for ringing in ear or discharge Eyes: Negative for double vision or worsening visual disturbance.  Respiratory: Negative for choking and stridor.  Gastrointestinal: Negative for vomiting or other signifcant bowel change Genitourinary: Negative for hematuria or change in urine volume.  Musculoskeletal: Negative for other MSK pain or swelling Skin: Negative for color change and worsening wound.  Neurological: Negative for tremors and numbness other than noted  Psychiatric/Behavioral: Negative for decreased concentration or agitation other than above       Objective:   Physical Exam BP 128/78 mmHg  Pulse 103  Temp(Src) 98.4 F (36.9 C) (Oral)  Resp 20  Ht 6' (1.829 m)  Wt 343 lb (155.584 kg)  BMI 46.51 kg/m2  SpO2 97%  LMP 09/08/2010 VS noted,  Constitutional: Pt  appears in no significant distress HENT: Head: NCAT.  Right Ear: External ear normal.  Left Ear: External ear normal.  Eyes: . Pupils are equal, round, and reactive to light. Conjunctivae and EOM are normal Neck: Normal range of motion. Neck supple.  Cardiovascular: Normal rate and regular rhythm.   Pulmonary/Chest: Effort normal and breath sounds without rales or wheezing.  Neurological: Pt is alert. Not confused , motor grossly intact Skin: Skin is warm. No rash, no LE edema Psychiatric: Pt behavior is normal. No agitation.  Right ankle with 2-3+ effusion, tender without signficant warmth or skin change, assoc with very large RLE edema to the knee    Assessment & Plan:

## 2015-04-19 NOTE — Progress Notes (Signed)
Pre visit review using our clinic review tool, if applicable. No additional management support is needed unless otherwise documented below in the visit note. 

## 2015-04-20 ENCOUNTER — Other Ambulatory Visit: Payer: Self-pay | Admitting: Internal Medicine

## 2015-04-20 ENCOUNTER — Encounter: Payer: Self-pay | Admitting: Internal Medicine

## 2015-04-20 DIAGNOSIS — M79671 Pain in right foot: Secondary | ICD-10-CM

## 2015-04-21 ENCOUNTER — Other Ambulatory Visit (HOSPITAL_COMMUNITY): Payer: Self-pay | Admitting: Internal Medicine

## 2015-05-02 ENCOUNTER — Other Ambulatory Visit (INDEPENDENT_AMBULATORY_CARE_PROVIDER_SITE_OTHER): Payer: Managed Care, Other (non HMO)

## 2015-05-02 ENCOUNTER — Other Ambulatory Visit (HOSPITAL_COMMUNITY): Payer: Self-pay | Admitting: *Deleted

## 2015-05-02 ENCOUNTER — Telehealth (HOSPITAL_COMMUNITY): Payer: Self-pay

## 2015-05-02 ENCOUNTER — Ambulatory Visit (INDEPENDENT_AMBULATORY_CARE_PROVIDER_SITE_OTHER): Payer: Managed Care, Other (non HMO) | Admitting: Family Medicine

## 2015-05-02 ENCOUNTER — Encounter: Payer: Self-pay | Admitting: *Deleted

## 2015-05-02 VITALS — BP 122/80 | HR 90 | Wt 339.0 lb

## 2015-05-02 DIAGNOSIS — S92251A Displaced fracture of navicular [scaphoid] of right foot, initial encounter for closed fracture: Secondary | ICD-10-CM | POA: Diagnosis not present

## 2015-05-02 DIAGNOSIS — M25571 Pain in right ankle and joints of right foot: Secondary | ICD-10-CM | POA: Diagnosis not present

## 2015-05-02 DIAGNOSIS — S92253A Displaced fracture of navicular [scaphoid] of unspecified foot, initial encounter for closed fracture: Secondary | ICD-10-CM | POA: Insufficient documentation

## 2015-05-02 MED ORDER — VITAMIN D (ERGOCALCIFEROL) 1.25 MG (50000 UNIT) PO CAPS: 50000.0000 [IU] | ORAL_CAPSULE | ORAL | Status: DC

## 2015-05-02 MED ORDER — RIVAROXABAN 20 MG PO TABS: ORAL_TABLET | ORAL | Status: DC

## 2015-05-02 NOTE — Assessment & Plan Note (Signed)
I do believe the patient has avascular necrosis based on the x-rays. They do feel that advance imaging is warranted to see how much arthritis could also be contributing. Depending on findings we will discuss any surgical intervention is necessary. Patient will try to be nonweightbearing as much as possible. Patient does have to do some walking and patient was given a Cam Walker to do this in. We discussed icing regimen as well as vitamin D supplementation to aid in healing. Patient will come back and see me again after the MRI and we will discuss further intervention necessary.

## 2015-05-02 NOTE — Patient Instructions (Addendum)
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  Use scooter as well.  Get the MRI we can go over it 1-2 days after and discuss the options.  Happy holidays!

## 2015-05-02 NOTE — Progress Notes (Signed)
[name] [address] [place], [place] [address] Phone: [phone] Subjective:    I'm seeing this patient by the request  of:  Oliver Barre, MD   CC: right ankle pain  [id number] Pamela [name] is a 59 y.o. female coming in with complaint of right ankle pain. Patient overall states that this pain is been going on for multiple months. Seems to worsen over the course of several weeks. Did see primary care provider and x-rays were taken. These x-rays were reviewed by me. 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 Patient's has not been doing anything differently now. Patient does do mostly seated work. Has been able to work. Has noticed: Up and down stairs have become more and more difficult. Patient is scheduled to have an MRI in 10 days. Denies any fevers chills. Continues to have swelling of the foot. Rates the severity of discomfort is 8 out of 10. Does not remember any true injury.  Past Medical History  Diagnosis Date  . DIABETES MELLITUS, TYPE II 12/31/2007  . ANXIETY 12/31/2007  . DEPRESSION 12/31/2007  . HYPERTENSION 12/31/2007  . ALLERGIC RHINITIS 12/31/2007  . Right knee DJD   . Rosacea 10/11/2010  . Diastolic dysfunction 10/11/2010  . Atrial fibrillation (HCC)   . Migraine   . PNA (pneumonia)     in her 13s  . Hyperlipidemia 04/23/2012   Past Surgical History  Procedure Laterality Date  . 2 d&c      9s   Social History  Substance Use Topics  . Smoking status: Never Smoker   . Smokeless tobacco: Not on file  . Alcohol Use: Yes     Comment: social   Allergies  Allergen Reactions  . Cymbalta [Duloxetine Hcl]     "loopiness"  . Gabapentin Itching   Family History  Problem Relation Age of Onset  . Arthritis Other   . Cancer Other     breast  . Heart disease Other   . Stroke Other   . Mental illness Other     emotional  . Diabetes Other         Past  medical history, social, surgical and family history all reviewed in electronic medical record.   Review of Systems: No headache, visual changes, nausea, vomiting, diarrhea, constipation, dizziness, abdominal pain, skin rash, fevers, chills, night sweats, weight loss, swollen lymph nodes, body aches, joint swelling, muscle aches, chest pain, shortness of breath, mood changes.   Objective Blood pressure 122/80, pulse 90, weight 339 lb (153.769 kg), last menstrual period 09/08/2010, SpO2 96 %.  General: No apparent distress alert and oriented x3 mood and affect normal, dressed appropriately.  Morbid obesity.  HEENT: Pupils equal, extraocular movements intact  Respiratory: Patient's speak in full sentences and does not appear short of breath  Cardiovascular: No lower extremity edema, non tender, no erythema  Skin: Warm dry intact with no signs of infection or rash on extremities or on axial skeleton.  Abdomen: Soft nontender  Neuro: Cranial nerves II through XII are intact, neurovascularly intact in all extremities with 2+ DTRs and 2+ pulses.  Lymph: No lymphadenopathy of posterior or anterior cervical chain or axillae bilaterally.  Gait antalgic MSK:  Non tender with full range of motion and good stability and symmetric strength and tone of shoulders, elbows, wrist, hip, knee and bilaterally.  Patient's right ankle and right foot does have dorsal swelling. +2 pitting edema  noted. This is only on one leg. Patient is severely tender over the navicular bone. Full range of motion of the ankle. Neurovascular intact distally. Able to bear weight but does have an antalgic gait   Impression and Recommendations:     This case required medical decision making of moderate complexity.

## 2015-05-02 NOTE — Telephone Encounter (Signed)
Rx sent for refill of Xarelto

## 2015-05-12 ENCOUNTER — Ambulatory Visit
Admission: RE | Admit: 2015-05-12 | Discharge: 2015-05-12 | Disposition: A | Discharge status: Home / Self Care | Payer: Managed Care, Other (non HMO) | Source: Ambulatory Visit | Attending: Internal Medicine | Admitting: Internal Medicine

## 2015-05-12 DIAGNOSIS — M79671 Pain in right foot: Secondary | ICD-10-CM

## 2015-05-14 ENCOUNTER — Other Ambulatory Visit (HOSPITAL_COMMUNITY): Payer: Self-pay | Admitting: Internal Medicine

## 2015-05-15 ENCOUNTER — Telehealth: Payer: Self-pay | Admitting: Internal Medicine

## 2015-05-15 NOTE — Telephone Encounter (Signed)
Pt called regarding MRI results  She can be reached at [phone] until 4:15

## 2015-05-15 NOTE — Telephone Encounter (Signed)
Attempted to call patient twice a day. Unable to get a hold of her. Try calling her again tomorrow if you have time. MRI shows the patient does have pathological fractures likely secondary to peripheral neuropathy.likely secondary to poor control of her diabetes. Can be difficult to heal. The patient to do once weekly vitamin D supplementation and continue this indefinitely. Patient will also likely need to avoid ambulation on this foot. I would consider getting patient and kneeling walking scooter. Can return for follow-up appointment if she was asked more questions.

## 2015-05-16 ENCOUNTER — Other Ambulatory Visit: Payer: Self-pay

## 2015-05-16 DIAGNOSIS — S92251G Displaced fracture of navicular [scaphoid] of right foot, subsequent encounter for fracture with delayed healing: Secondary | ICD-10-CM

## 2015-05-16 MED ORDER — VITAMIN D (ERGOCALCIFEROL) 1.25 MG (50000 UNIT) PO CAPS: 50000.0000 [IU] | ORAL_CAPSULE | ORAL | Status: DC

## 2015-05-30 ENCOUNTER — Encounter: Payer: Self-pay | Admitting: Family Medicine

## 2015-05-30 ENCOUNTER — Ambulatory Visit (INDEPENDENT_AMBULATORY_CARE_PROVIDER_SITE_OTHER): Payer: Managed Care, Other (non HMO) | Admitting: Family Medicine

## 2015-05-30 VITALS — BP 126/80 | HR 103 | Ht 72.0 in | Wt 339.0 lb

## 2015-05-30 DIAGNOSIS — S92253P Displaced fracture of navicular [scaphoid] of unspecified foot, subsequent encounter for fracture with malunion: Secondary | ICD-10-CM | POA: Insufficient documentation

## 2015-05-30 DIAGNOSIS — G5791 Unspecified mononeuropathy of right lower limb: Secondary | ICD-10-CM

## 2015-05-30 DIAGNOSIS — M792 Neuralgia and neuritis, unspecified: Secondary | ICD-10-CM

## 2015-05-30 DIAGNOSIS — S92251P Displaced fracture of navicular [scaphoid] of right foot, subsequent encounter for fracture with malunion: Secondary | ICD-10-CM | POA: Diagnosis not present

## 2015-05-30 MED ORDER — ALENDRONATE SODIUM 70 MG PO TABS: 70.0000 mg | ORAL_TABLET | ORAL | Status: DC

## 2015-05-30 NOTE — Progress Notes (Signed)
Tawana ScaleZach Rileyann Florance D.O. [facility] [address] [place], [place] [address] Phone: [phone]) [phone] Subjective:    I'm seeing this patient by the request  of:  Oliver BarreJames John, MD   CC: right ankle pain follow-up  BJY:NWGNFAOZHYHPI:Subjective Pamela GooLauren E [name] is a 59 y.o. female coming in with complaint of right ankle pain. Patient overall states that this pain is been going on for multiple months. Seems to worsen over the course of several weeks. Did see primary care provider and x-rays were taken. These x-rays were reviewed by me. 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  Patient was having such severe pain that advance imaging was warranted. Advance imaging was done and an MRI patient's foot was obtained.patient's MRI of her foot didn't show severe neuropathic foot with significant pathological fractures with multiple joint effusions. Patient did have a navicular bone that is fractured and displaced as well as surrounding myositis. Contact decent amount of this seems to be chronic in nature. Patient was unable to become nonweightbearing. Patient states that she's been ambulating in the [employer] Walker. Would state that she's feeling 5% better.  Past Medical History  Diagnosis Date  . DIABETES MELLITUS, TYPE II 12/31/2007  . ANXIETY 12/31/2007  . DEPRESSION 12/31/2007  . HYPERTENSION 12/31/2007  . ALLERGIC RHINITIS 12/31/2007  . Right knee DJD   . Rosacea 10/11/2010  . Diastolic dysfunction 10/11/2010  . Atrial fibrillation (HCC)   . Migraine   . PNA (pneumonia)     in her 5720s  . Hyperlipidemia 04/23/2012   Past Surgical History  Procedure Laterality Date  . 2 d&c      441980s   Social History  Substance Use Topics  . Smoking status: Never Smoker   . Smokeless tobacco: None  . Alcohol Use: Yes     Comment: social   Allergies  Allergen Reactions  . Cymbalta [Duloxetine Hcl]     "loopiness"  . Gabapentin Itching   Family History    Problem Relation Age of Onset  . Arthritis Other   . Cancer Other     breast  . Heart disease Other   . Stroke Other   . Mental illness Other     emotional  . Diabetes Other         Past medical history, social, surgical and family history all reviewed in electronic medical record.   Review of Systems: No headache, visual changes, nausea, vomiting, diarrhea, constipation, dizziness, abdominal pain, skin rash, fevers, chills, night sweats, weight loss, swollen lymph nodes, body aches, joint swelling, muscle aches, chest pain, shortness of breath, mood changes.   Objective Blood pressure 126/80, pulse 103, height 6' (1.829 m), weight 339 lb (153.769 kg), last menstrual period 09/08/2010, SpO2 95 %.  General: No apparent distress alert and oriented x3 mood and affect normal, dressed appropriately.  Morbid obesity.  HEENT: Pupils equal, extraocular movements intact  Respiratory: Patient's speak in full sentences and does not appear short of breath  Cardiovascular: No lower extremity edema, non tender, no erythema  Skin: Warm dry intact with no signs of infection or rash on extremities or on axial skeleton.  Abdomen: Soft nontender  Neuro: Cranial nerves II through XII are intact, neurovascularly intact in all extremities with 2+ DTRs and 2+ pulses.  Lymph: No lymphadenopathy of posterior or anterior cervical chain or axillae bilaterally.  Gait antalgic MSK:  Non tender with full range of motion and good stability and  symmetric strength and tone of shoulders, elbows, wrist, hip, knee and bilaterally.  Patient's right ankle and right foot does have dorsal swelling. +2 pitting edema noted still with mild improvement This is only on one leg. Patient is severely tender over the navicular boneas well as the dorsal aspect of the foot. Full range of motion of the ankle. Neurovascular intact distally mild numbness but seems to be symmetric to the contralateral side. Able to bear weight but does  have an antalgic gait   Impression and Recommendations:     This case required medical decision making of moderate complexity.

## 2015-05-30 NOTE — Patient Instructions (Signed)
Good to see you  I am sorry for the bad news Stay off the foot as much as possible Continue the Vitamin D Try the fosamax weekly for next 2 months.  Please take it with a large glass of water and stay upright for 30 minutes Continue to wear boot when up.  See me again in 3 weeks and we will discuss weight bearing vs another 3 weeks of non weight bearing Happy holidays!

## 2015-05-30 NOTE — Progress Notes (Signed)
Pre visit review using our clinic review tool, if applicable. No additional management support is needed unless otherwise documented below in the visit note. 

## 2015-05-30 NOTE — Assessment & Plan Note (Signed)
Discussed with patient at length. It appears that patient's fracture is likely chronic. I do think that patient's neuropathic foot is likely what is contributing to the pain. We discussed that it is going to be significantly important for patient to have control of her diabetes as well as the peripheral neuropathy of possible. We discussed that patient is going to need to be nonweightbearing for likely a total of 6 weeks. Patient is to get a kneeling walker and has already been given a prescription for this. We did discuss the over-the-counter and try to do some research to find the cheapest 1 possible for her. We discussed that patient may fail and may need surgical intervention if she does not make any significant improvement. Patient was told what symptoms and when to seek medical attention. Patient will come back in 3 weeks for further evaluation and make sure making some improvement. Added bisphosphonate to help with healing. Warned the potential side effects.once again patient will come back in 3 weeks  Spent  25 minutes with patient face-to-face and had greater than 50% of counseling including as described above in assessment and plan.

## 2015-06-09 ENCOUNTER — Other Ambulatory Visit (HOSPITAL_COMMUNITY): Payer: Self-pay | Admitting: Internal Medicine

## 2015-06-21 ENCOUNTER — Ambulatory Visit (INDEPENDENT_AMBULATORY_CARE_PROVIDER_SITE_OTHER): Payer: Managed Care, Other (non HMO) | Admitting: Family Medicine

## 2015-06-21 ENCOUNTER — Encounter: Payer: Self-pay | Admitting: Family Medicine

## 2015-06-21 VITALS — BP 128/80 | HR 106 | Ht 72.0 in | Wt 336.0 lb

## 2015-06-21 DIAGNOSIS — S92251P Displaced fracture of navicular [scaphoid] of right foot, subsequent encounter for fracture with malunion: Secondary | ICD-10-CM

## 2015-06-21 NOTE — Assessment & Plan Note (Signed)
Discussed with patient again at great length. Healing because of her comorbidities is going to be extremely slow if even existent. Patient though still is willing to given more time. Patient also avoid any surgery if possible. Discussed with her she will likely have some chronic pain at all times in this foot. Patient's nose that if any worsening symptoms to call and I would think that referral to an orthopedic specialist would be beneficial. At this time I encourage her to continue to wear the Cam Walker still a regular basis. Prescription for compression stockings to help with some of the healing. Continue the medications. Patient will come back again in 3-4 weeks for further evaluation  Spent  25 minutes with patient face-to-face and had greater than 50% of counseling including as described above in assessment and plan.

## 2015-06-21 NOTE — Progress Notes (Signed)
Pre visit review using our clinic review tool, if applicable. No additional management support is needed unless otherwise documented below in the visit note. 

## 2015-06-21 NOTE — Progress Notes (Signed)
Pamela [name] D.O. [facility] [address] [place], [place] [address] Phone: [phone] Subjective:    I'm seeing this patient by the request  of:  Oliver BarreJames John, MD   CC: right ankle pain follow-up  WGN:FAOZHYQMVHHPI:Subjective Pamela GooLauren E [name] is a 60 y.o. female coming in with complaint of right ankle pain. Patient overall states that this pain is been going on for multiple months. Seems to worsen over the course of several weeks. Did see primary care provider and x-rays were taken. These x-rays were reviewed by me. 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  Patient was having such severe pain that advance imaging was warranted. 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 Patient did have a navicular bone that is fractured and displaced as well as surrounding myositis. Patient is continue to wear the Cam Walker. Patient elected try to do conservative therapy and did not want to start any other more aggressive therapies such as possible surgical intervention. Patient states that the swelling is still there. Still given her some discomfort when she is walking a lot. Not any worse. No side effects to the Fosamax.  Past Medical History  Diagnosis Date  . DIABETES MELLITUS, TYPE II 12/31/2007  . ANXIETY 12/31/2007  . DEPRESSION 12/31/2007  . HYPERTENSION 12/31/2007  . ALLERGIC RHINITIS 12/31/2007  . Right knee DJD   . Rosacea 10/11/2010  . Diastolic dysfunction 10/11/2010  . Atrial fibrillation (HCC)   . Migraine   . PNA (pneumonia)     in her 2520s  . Hyperlipidemia 04/23/2012   Past Surgical History  Procedure Laterality Date  . 2 d&c      651980s   Social History  Substance Use Topics  . Smoking status: Never Smoker   . Smokeless tobacco: None  . Alcohol Use: Yes     Comment:  social   Allergies  Allergen Reactions  . Cymbalta [Duloxetine Hcl]     "loopiness"  . Gabapentin Itching   Family History  Problem Relation Age of Onset  . Arthritis Other   . Cancer Other     breast  . Heart disease Other   . Stroke Other   . Mental illness Other     emotional  . Diabetes Other         Past medical history, social, surgical and family history all reviewed in electronic medical record.   Review of Systems: No headache, visual changes, nausea, vomiting, diarrhea, constipation, dizziness, abdominal pain, skin rash, fevers, chills, night sweats, weight loss, swollen lymph nodes, body aches, joint swelling, muscle aches, chest pain, shortness of breath, mood changes.   Objective Blood pressure 128/80, pulse 106, height 6' (1.829 m), weight 336 lb (152.409 kg), last menstrual period 09/08/2010, SpO2 94 %.  General: No apparent distress alert and oriented x3 mood and affect normal, dressed appropriately.  Morbid obesity.  HEENT: Pupils equal, extraocular movements intact  Respiratory: Patient's speak in full sentences and does not appear short of breath  Cardiovascular: No lower extremity edema, non tender, no erythema  Skin: Warm dry intact with no signs of infection or rash on extremities or on axial skeleton.  Abdomen: Soft nontender  Neuro: Cranial nerves II through XII are intact, neurovascularly intact in all extremities with 2+ DTRs and 2+ pulses.  Lymph: No lymphadenopathy of posterior  or anterior cervical chain or axillae bilaterally.  Gait antalgic MSK:  Non tender with full range of motion and good stability and symmetric strength and tone of shoulders, elbows, wrist, hip, knee and bilaterally.  Patient's right ankle and right foot does have dorsal swelling. +2 pitting edema noted still with very minimal improvement. Continues to have significant year pain over the navicular bone itself. Does have numbness of the toes. Not worse than previous exam.     Impression and Recommendations:     This case required medical decision making of moderate complexity.

## 2015-06-21 NOTE — Patient Instructions (Signed)
Good to see you It will be slow and sweet Pick up the compression stockings OCnitnue the boot as much as possible Continue the vitamin D and the fosamax Lets check in again in 4 weeks.

## 2015-07-19 ENCOUNTER — Encounter: Payer: Self-pay | Admitting: Family Medicine

## 2015-07-19 ENCOUNTER — Ambulatory Visit (INDEPENDENT_AMBULATORY_CARE_PROVIDER_SITE_OTHER): Payer: Managed Care, Other (non HMO) | Admitting: Family Medicine

## 2015-07-19 ENCOUNTER — Other Ambulatory Visit: Payer: Self-pay

## 2015-07-19 VITALS — BP 124/84 | HR 88 | Wt 342.0 lb

## 2015-07-19 DIAGNOSIS — S92251P Displaced fracture of navicular [scaphoid] of right foot, subsequent encounter for fracture with malunion: Secondary | ICD-10-CM

## 2015-07-19 NOTE — Patient Instructions (Signed)
You are doing better and I am impressed No boot in the house.  Ice still a little Continue the medicines See me again in 4 weeks.

## 2015-07-19 NOTE — Assessment & Plan Note (Addendum)
Patient is attempting to continue with the conservative therapy. Patient has made more progress and I anticipated. Patient will continue with the compression stockings as well as continue with the Fosamax. Patient can start wearing the brace less. If worsening pain we need either consider orthopedic surgery for further evaluation and possible surgery. Patient still wants to decline this. In addition of this patient does have some peripheral neuropathy. Patient is going to be referred to a podiatrist for her foot care. Patient does need her nails trimmed. If patient has worsening symptoms she will call sooner. Otherwise we'll see patient back again in 4 weeks. We may also want a question a ABI as well as an EMG of the lower extremity to see if there is any other reasons why patient is not healing appropriate.

## 2015-07-19 NOTE — Progress Notes (Signed)
[facility] [address] [place], [place] [address] Phone: [phone] Subjective:    I'm seeing this patient by the request  of:  Oliver Barre, MD   CC: right ankle pain follow-up  BJY:NWGNFAOZHY LAINI URICK is a 60 y.o. female coming in with complaint of right ankle pain. Patient overall states that this pain is been going on for multiple months. Seems to worsen over the course of several weeks. Did see primary care provider and x-rays were taken. These x-rays were reviewed by me. 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  Patient was having such severe pain that advance imaging was warranted. 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 Patient did have a navicular bone that is fractured and displaced as well as surrounding myositis. Patient is continue to wear the Cam Walker. Patient elected try to do conservative therapy and did not want to start any other more aggressive therapies such as possible surgical intervention. Patient states that the swelling is still there. Still given her some discomfort when she is walking a lot. Not any worse. No side effects to the Fosamax.  Past Medical History  Diagnosis Date  . DIABETES MELLITUS, TYPE II 12/31/2007  . ANXIETY 12/31/2007  . DEPRESSION 12/31/2007  . HYPERTENSION 12/31/2007  . ALLERGIC RHINITIS 12/31/2007  . Right knee DJD   . Rosacea 10/11/2010  . Diastolic dysfunction 10/11/2010  . Atrial fibrillation (HCC)   . Migraine   . PNA (pneumonia)     in her 62s  . Hyperlipidemia 04/23/2012   Past Surgical History  Procedure Laterality Date  . 2 d&c      67s   Social History  Substance Use Topics  . Smoking status: Never Smoker   . Smokeless tobacco: None  . Alcohol Use: Yes     Comment:  social   Allergies  Allergen Reactions  . Cymbalta [Duloxetine Hcl]     "loopiness"  . Gabapentin Itching   Family History  Problem Relation Age of Onset  . Arthritis Other   . Cancer Other     breast  . Heart disease Other   . Stroke Other   . Mental illness Other     emotional  . Diabetes Other         Past medical history, social, surgical and family history all reviewed in electronic medical record.   Review of Systems: No headache, visual changes, nausea, vomiting, diarrhea, constipation, dizziness, abdominal pain, skin rash, fevers, chills, night sweats, weight loss, swollen lymph nodes, body aches, joint swelling, muscle aches, chest pain, shortness of breath, mood changes.   Objective Blood pressure 124/84, pulse 88, weight 342 lb (155.13 kg), last menstrual period 09/08/2010, SpO2 96 %.  General: No apparent distress alert and oriented x3 mood and affect normal, dressed appropriately.  Morbid obesity.  HEENT: Pupils equal, extraocular movements intact  Respiratory: Patient's speak in full sentences and does not appear short of breath  Cardiovascular: No lower extremity edema, non tender, no erythema  Skin: Warm dry intact with no signs of infection or rash on extremities or on axial skeleton.  Abdomen: Soft nontender  Neuro: Cranial nerves II through XII are intact, neurovascularly intact in all extremities with 2+ DTRs and 2+ pulses.  Lymph: No lymphadenopathy of posterior or anterior cervical chain  or axillae bilaterally.  Gait antalgic MSK:  Non tender with full range of motion and good stability and symmetric strength and tone of shoulders, elbows, wrist, hip, knee and bilaterally.  Patient's right ankle and right foot continues to have some dorsal swelling but improved. Still some mild diffuse tenderness but no significant acute pain. Neurovascularly intact distally. Poor toe hygiene. Significantly less tender over the navicular bone.   Impression and  Recommendations:     This case required medical decision making of moderate complexity.

## 2015-08-16 ENCOUNTER — Ambulatory Visit (INDEPENDENT_AMBULATORY_CARE_PROVIDER_SITE_OTHER): Payer: Managed Care, Other (non HMO) | Admitting: Family Medicine

## 2015-08-16 ENCOUNTER — Encounter: Payer: Self-pay | Admitting: Family Medicine

## 2015-08-16 VITALS — BP 126/82 | HR 95

## 2015-08-16 DIAGNOSIS — M792 Neuralgia and neuritis, unspecified: Secondary | ICD-10-CM

## 2015-08-16 DIAGNOSIS — S92251A Displaced fracture of navicular [scaphoid] of right foot, initial encounter for closed fracture: Secondary | ICD-10-CM | POA: Diagnosis not present

## 2015-08-16 DIAGNOSIS — G5791 Unspecified mononeuropathy of right lower limb: Secondary | ICD-10-CM

## 2015-08-16 NOTE — Patient Instructions (Addendum)
Good to see you  Continue the boot at work another 2 weeks Continue the vitamin D and the fosamax.  OK to wear shoe in the house and on errands and then in 3 weeks try a shoe for half day for 3 days then for a full day thereafter If worsening pain then go back to the boot.  See me again in 5 weeks.

## 2015-08-16 NOTE — Assessment & Plan Note (Signed)
Patient unable to tolerate gabapentin and does not want to try Lyrica.

## 2015-08-16 NOTE — Assessment & Plan Note (Signed)
Healing but very slowly. I'm not expecting her to be completely pain-free. Patient in 3 weeks we'll start progressing into regular shoes. Continue the vitamin D and Fosamax. We discussed icing regimen. Patient will see him but titrates for the foot hygiene. Patient and will come back and see me again in 6 weeks for further evaluation. If worsening symptoms patient knows to go back to the boot.

## 2015-08-16 NOTE — Progress Notes (Signed)
[facility] [address], [place] [address] Phone: [phone] Subjective:    I'm seeing this patient by the request  of:  Oliver Barre, MD   CC: right ankle pain follow-up  [id number] Pamela [name] is a 60 y.o. female coming in with complaint of right ankle pain. Patient overall states that this pain is been going on for multiple months. Seems to worsen over the course of several weeks. Did see primary care provider and x-rays were taken. These x-rays were reviewed by me. 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  Patient was having such severe pain that advance imaging was warranted. 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 Patient did have a navicular bone that is fractured and displaced as well as surrounding myositis.   Patient has been on Fosamax and vitamin D for quite some time and continue to be on the Cam Walker. Patient has continued to elect to be on conservative therapy. Patient was continued to avoid surgical intervention. Patient states Overall she does think she is improving. Still has soreness that seems to be all the time but not as much of the severe pain. Patient has been trying to walk in a shoe at home and has been noticing some improvement. Denies any numbness. States that overall she seems to be making some progress.    Past Medical History  Diagnosis Date  . DIABETES MELLITUS, TYPE II 12/31/2007  . ANXIETY 12/31/2007  . DEPRESSION 12/31/2007  . HYPERTENSION 12/31/2007  . ALLERGIC RHINITIS 12/31/2007  . Right knee DJD   . Rosacea 10/11/2010  . Diastolic dysfunction 10/11/2010  . Atrial fibrillation (HCC)   . Migraine   . PNA (pneumonia)     in her 23s  . Hyperlipidemia 04/23/2012   Past Surgical History    Procedure Laterality Date  . 2 d&c      76s   Social History  Substance Use Topics  . Smoking status: Never Smoker   . Smokeless tobacco: None  . Alcohol Use: Yes     Comment: social   Allergies  Allergen Reactions  . Cymbalta [Duloxetine Hcl]     "loopiness"  . Gabapentin Itching   Family History  Problem Relation Age of Onset  . Arthritis Other   . Cancer Other     breast  . Heart disease Other   . Stroke Other   . Mental illness Other     emotional  . Diabetes Other       Past medical history, social, surgical and family history all reviewed in electronic medical record.   Review of Systems: No headache, visual changes, nausea, vomiting, diarrhea, constipation, dizziness, abdominal pain, skin rash, fevers, chills, night sweats, weight loss, swollen lymph nodes, body aches, joint swelling, muscle aches, chest pain, shortness of breath, mood changes.   Objective Blood pressure 126/82, pulse 95, last menstrual period 09/08/2010, SpO2 95 %.  General: No apparent distress alert and oriented x3 mood and affect normal, dressed appropriately.  Morbid obesity.  HEENT: Pupils equal, extraocular movements intact  Respiratory: Patient's speak in full sentences and does not appear short of breath  Cardiovascular: No lower extremity edema, non tender, no erythema  Skin: Warm dry intact with no signs of infection or rash on extremities or on axial skeleton.  Abdomen: Soft nontender  Neuro: Cranial nerves II through XII are intact, neurovascularly intact in all extremities with 2+ DTRs and 2+ pulses.  Lymph: No lymphadenopathy of posterior or anterior cervical chain or axillae bilaterally.  Gait antalgic MSK:  Non tender with full range of motion and good stability and symmetric strength and tone of shoulders, elbows, wrist, hip, knee and bilaterally.  Patient's right ankle and right foot continues to have some dorsal swelling but improved. Still some mild diffuse tenderness but  no significant acute pain. Neurovascularly intact distally. Very minimal tenderness over the navicular bone today. Continue improvement from previous exam.   Impression and Recommendations:     This case required medical decision making of moderate complexity.

## 2015-08-16 NOTE — Progress Notes (Signed)
Pre visit review using our clinic review tool, if applicable. No additional management support is needed unless otherwise documented below in the visit note. 

## 2015-09-08 ENCOUNTER — Other Ambulatory Visit: Payer: Self-pay | Admitting: Family Medicine

## 2015-09-11 NOTE — Telephone Encounter (Signed)
Refill done.  

## 2015-09-20 ENCOUNTER — Ambulatory Visit (INDEPENDENT_AMBULATORY_CARE_PROVIDER_SITE_OTHER): Payer: Managed Care, Other (non HMO) | Admitting: Family Medicine

## 2015-09-20 ENCOUNTER — Encounter: Payer: Self-pay | Admitting: Internal Medicine

## 2015-09-20 ENCOUNTER — Other Ambulatory Visit (INDEPENDENT_AMBULATORY_CARE_PROVIDER_SITE_OTHER): Payer: Managed Care, Other (non HMO)

## 2015-09-20 ENCOUNTER — Encounter: Payer: Self-pay | Admitting: Family Medicine

## 2015-09-20 ENCOUNTER — Ambulatory Visit (INDEPENDENT_AMBULATORY_CARE_PROVIDER_SITE_OTHER): Payer: Managed Care, Other (non HMO) | Admitting: Internal Medicine

## 2015-09-20 VITALS — HR 102 | Temp 98.0°F | Resp 18

## 2015-09-20 VITALS — BP 122/82 | HR 96 | Wt 340.0 lb

## 2015-09-20 DIAGNOSIS — S92251A Displaced fracture of navicular [scaphoid] of right foot, initial encounter for closed fracture: Secondary | ICD-10-CM

## 2015-09-20 DIAGNOSIS — Z0000 Encounter for general adult medical examination without abnormal findings: Secondary | ICD-10-CM

## 2015-09-20 DIAGNOSIS — Z0001 Encounter for general adult medical examination with abnormal findings: Secondary | ICD-10-CM

## 2015-09-20 DIAGNOSIS — E119 Type 2 diabetes mellitus without complications: Secondary | ICD-10-CM

## 2015-09-20 DIAGNOSIS — E785 Hyperlipidemia, unspecified: Secondary | ICD-10-CM | POA: Diagnosis not present

## 2015-09-20 DIAGNOSIS — I10 Essential (primary) hypertension: Secondary | ICD-10-CM | POA: Diagnosis not present

## 2015-09-20 DIAGNOSIS — R05 Cough: Secondary | ICD-10-CM

## 2015-09-20 DIAGNOSIS — R059 Cough, unspecified: Secondary | ICD-10-CM

## 2015-09-20 LAB — URINALYSIS, ROUTINE W REFLEX MICROSCOPIC
BILIRUBIN URINE: NEGATIVE
KETONES UR: NEGATIVE
NITRITE: NEGATIVE
PH: 5.5 (ref 5.0–8.0)
SPECIFIC GRAVITY, URINE: 1.015 (ref 1.000–1.030)
URINE GLUCOSE: NEGATIVE
Urobilinogen, UA: 0.2 (ref 0.0–1.0)

## 2015-09-20 LAB — HEPATIC FUNCTION PANEL
ALBUMIN: 4.4 g/dL (ref 3.5–5.2)
ALT: 22 U/L (ref 0–35)
AST: 13 U/L (ref 0–37)
Alkaline Phosphatase: 45 U/L (ref 39–117)
BILIRUBIN TOTAL: 0.4 mg/dL (ref 0.2–1.2)
Bilirubin, Direct: 0.1 mg/dL (ref 0.0–0.3)
Total Protein: 7.8 g/dL (ref 6.0–8.3)

## 2015-09-20 LAB — LIPID PANEL
CHOL/HDL RATIO: 3
CHOLESTEROL: 139 mg/dL (ref 0–200)
HDL: 43.3 mg/dL (ref >39.00)
LDL CALC: 63 mg/dL (ref 0–99)
NonHDL: 95.89
TRIGLYCERIDES: 166 mg/dL — AB (ref 0.0–149.0)
VLDL: 33.2 mg/dL (ref 0.0–40.0)

## 2015-09-20 LAB — CBC WITH DIFFERENTIAL/PLATELET
BASOS PCT: 0.5 % (ref 0.0–3.0)
Basophils Absolute: 0.1 10*3/uL (ref 0.0–0.1)
EOS PCT: 1.7 % (ref 0.0–5.0)
Eosinophils Absolute: 0.2 10*3/uL (ref 0.0–0.7)
HEMATOCRIT: 41.9 % (ref 36.0–46.0)
HEMOGLOBIN: 13.9 g/dL (ref 12.0–15.0)
LYMPHS PCT: 14.9 % (ref 12.0–46.0)
Lymphs Abs: 1.7 10*3/uL (ref 0.7–4.0)
MCHC: 33.1 g/dL (ref 30.0–36.0)
MCV: 81.4 fl (ref 78.0–100.0)
MONO ABS: 0.9 10*3/uL (ref 0.1–1.0)
Monocytes Relative: 7.7 % (ref 3.0–12.0)
Neutro Abs: 8.8 10*3/uL — ABNORMAL HIGH (ref 1.4–7.7)
Neutrophils Relative %: 75.2 % (ref 43.0–77.0)
Platelets: 272 10*3/uL (ref 150.0–400.0)
RBC: 5.15 Mil/uL — AB (ref 3.87–5.11)
RDW: 15.9 % — AB (ref 11.5–15.5)
WBC: 11.7 10*3/uL — AB (ref 4.0–10.5)

## 2015-09-20 LAB — BASIC METABOLIC PANEL
BUN: 24 mg/dL — AB (ref 6–23)
CALCIUM: 10.3 mg/dL (ref 8.4–10.5)
CO2: 28 mEq/L (ref 19–32)
CREATININE: 1.49 mg/dL — AB (ref 0.40–1.20)
Chloride: 98 mEq/L (ref 96–112)
GFR: 38 mL/min — AB (ref >60.00)
GLUCOSE: 201 mg/dL — AB (ref 70–99)
Potassium: 4 mEq/L (ref 3.5–5.1)
Sodium: 137 mEq/L (ref 135–145)

## 2015-09-20 LAB — MICROALBUMIN / CREATININE URINE RATIO
CREATININE, U: 86.4 mg/dL
Microalb Creat Ratio: 13.5 mg/g (ref 0.0–30.0)
Microalb, Ur: 11.7 mg/dL — ABNORMAL HIGH (ref 0.0–1.9)

## 2015-09-20 LAB — HEMOGLOBIN A1C: Hgb A1c MFr Bld: 9.1 % — ABNORMAL HIGH (ref 4.6–6.5)

## 2015-09-20 LAB — TSH: TSH: 1.54 u[IU]/mL (ref 0.35–4.50)

## 2015-09-20 MED ORDER — LEVOFLOXACIN 500 MG PO TABS: 500.0000 mg | ORAL_TABLET | Freq: Every day | ORAL | Status: DC

## 2015-09-20 MED ORDER — HYDROCODONE-HOMATROPINE 5-1.5 MG/5ML PO SYRP: 5.0000 mL | ORAL_SOLUTION | Freq: Four times a day (QID) | ORAL | Status: DC | PRN

## 2015-09-20 NOTE — Patient Instructions (Addendum)
Great to see you  No boot on easter! Ice when you need it Wean yourself out of the boot! Continue the vitamin D indefinitely.  Stop fosamax after this time but if it worsens after 1-2 months we may need to restart it Have an appointment in 2 months and if not worse  Then see me when you need me!

## 2015-09-20 NOTE — Assessment & Plan Note (Signed)
Uncontrolled recnetly, o/w .stable overall by history and exam, recent data reviewed with pt, and pt to continue medical treatment as before,  to f/u any worsening symptoms or concerns Lab Results  Component Value Date   HGBA1C 9.8* 04/12/2015

## 2015-09-20 NOTE — Patient Instructions (Signed)
Please take all new medication as prescribed - the antibiotic, and the cough medicine if needed  Please continue all other medications as before, and refills have been done if requested.  Please have the pharmacy call with any other refills you may need.  Please continue your efforts at being more active, low cholesterol diet, and weight control.  You are otherwise up to date with prevention measures today.  Please keep your appointments with your specialists as you may have planned  Please go to the LAB in the Basement (turn left off the elevator) for the tests to be done today  You will be contacted by phone if any changes need to be made immediately.  Otherwise, you will receive a letter about your results with an explanation, but please check with MyChart first.  Please remember to sign up for MyChart if you have not done so, as this will be important to you in the future with finding out test results, communicating by private email, and scheduling acute appointments online when needed.  Please return in 6 months, or sooner if needed, with Lab testing done 3-5 days before

## 2015-09-20 NOTE — Progress Notes (Signed)
Subjective:    Patient ID: Pamela [name], female    DOB: 09-15-1955, 60 y.o.   MRN: [id number]  HPI  Here for wellness and f/u;  Overall doing ok;  Pt denies Chest pain, worsening SOB, DOE, wheezing, orthopnea, PND, worsening LE edema, palpitations, dizziness or syncope.  Pt denies neurological change such as new headache, facial or extremity weakness.  Pt denies polydipsia, polyuria, or low sugar symptoms. Pt states overall good compliance with treatment and medications, good tolerability, and has been trying to follow appropriate diet.  Pt denies worsening depressive symptoms, suicidal ideation or panic. No fever, night sweats, wt loss, loss of appetite, or other constitutional symptoms.  Pt states good ability with ADL's, has low fall risk, home safety reviewed and adequate, no other significant changes in hearing or vision, and only occasionally active with exercise. S/p tx for stress fx right ankle, now ready to wean off the boot per Dr Katrinka Blazing., wearing compression stockings.  Sees GYN regularly.  Does not recall prior DXA. Incidnetly today, c/o 1 wk not feeling well, feeling warm but no high temp at home or here.  Has hx of migraine, but has a sense of general pressure to the head unusual for her, would not call it pain, but feels exhausted, fatigued.  Had to leave work early last Friday as well as Monday.  Has been back to work last 3 days but just not back to normal yet.  Denies urinary symptoms such as dysuria, frequency, urgency, flank pain, hematuria or n/v, fever, chills.  Does tend to wake at 3 am often recently, but not from snoring, not sure why, No daytime hypersomnolence. Past Medical History  Diagnosis Date  . DIABETES MELLITUS, TYPE II 12/31/2007  . ANXIETY 12/31/2007  . DEPRESSION 12/31/2007  . HYPERTENSION 12/31/2007  . ALLERGIC RHINITIS 12/31/2007  . Right knee DJD   . Rosacea 10/11/2010  . Diastolic dysfunction 10/11/2010  . Atrial fibrillation (HCC)   . Migraine   . PNA (pneumonia)      in her 22s  . Hyperlipidemia 04/23/2012   Past Surgical History  Procedure Laterality Date  . 2 d&c      1980s    reports that she has never smoked. She does not have any smokeless tobacco history on file. She reports that she drinks alcohol. She reports that she does not use illicit drugs. family history includes Arthritis in her other; Cancer in her other; Diabetes in her other; Heart disease in her other; Mental illness in her other; Stroke in her other. Allergies  Allergen Reactions  . Cymbalta [Duloxetine Hcl]     "loopiness"  . Gabapentin Itching   Current Outpatient Prescriptions on File Prior to Visit  Medication Sig Dispense Refill  . alendronate (FOSAMAX) 70 MG tablet TAKE 1 TABLET(70 MG) BY MOUTH 1 TIME A WEEK WITH A FULL GLASS OF WATER AND ON AN EMPTY STOMACH 8 tablet 0  . atorvastatin (LIPITOR) 10 MG tablet TAKE 1 TABLET BY MOUTH EVERY DAY 90 tablet 3  . cetirizine (ZYRTEC) 10 MG tablet Take 1 tablet (10 mg total) by mouth daily. 30 tablet 11  . diltiazem (CARDIZEM CD) 360 MG 24 hr capsule TAKE 1 CAPSULE(360 MG) BY MOUTH DAILY 30 capsule 0  . fluticasone (FLONASE) 50 MCG/ACT nasal spray USE 2 SPRAYS IN EACH NOSTRIL EVERY DAY 16 g 11  . furosemide (LASIX) 20 MG tablet TAKE 2 TABLETS BY MOUTH EVERY MORNING, AND TAKE 1 TABLET EVERY EVENING 270 tablet 3  .  glipiZIDE (GLUCOTROL XL) 10 MG 24 hr tablet Take 1 tablet (10 mg total) by mouth daily with breakfast. 90 tablet 3  . metFORMIN (GLUCOPHAGE) 1000 MG tablet Take 1 tablet (1,000 mg total) by mouth 2 (two) times daily with a meal. 60 tablet 11  . metoprolol succinate (TOPROL-XL) 50 MG 24 hr tablet Take 1 tablet (50 mg total) by mouth 2 (two) times daily. 180 tablet 3  . potassium chloride SA (K-DUR,KLOR-CON) 20 MEQ tablet Take 1 tablet (20 mEq total) by mouth daily. 30 tablet 6  . rivaroxaban (XARELTO) 20 MG TABS tablet TAKE 1 TABLET BY MOUTH EVERY DAY WITH SUPPER 30 tablet 9  . spironolactone (ALDACTONE) 25 MG tablet TAKE 1  TABLET BY MOUTH EVERY DAY 30 tablet 6  . Vitamin D, Ergocalciferol, (DRISDOL) 50000 UNITS CAPS capsule Take 1 capsule (50,000 Units total) by mouth every 7 (seven) days. 12 capsule 0  . XARELTO 20 MG TABS tablet TAKE 1 TABLET BY MOUTH EVERY DAY WITH SUPPER 30 tablet 0   No current facility-administered medications on file prior to visit.     Review of Systems Constitutional: Negative for increased diaphoresis, or other activity, appetite or siginficant weight change other than noted HENT: Negative for worsening hearing loss, ear pain, facial swelling, mouth sores and neck stiffness.   Eyes: Negative for other worsening pain, redness or visual disturbance.  Respiratory: Negative for choking or stridor Cardiovascular: Negative for other chest pain and palpitations.  Gastrointestinal: Negative for worsening diarrhea, blood in stool, or abdominal distention Genitourinary: Negative for hematuria, flank pain or change in urine volume.  Musculoskeletal: Negative for myalgias or other joint complaints.  Skin: Negative for other color change and wound or drainage.  Neurological: Negative for syncope and numbness. other than noted Hematological: Negative for adenopathy. or other swelling Psychiatric/Behavioral: Negative for hallucinations, SI, self-injury, decreased concentration or other worsening agitation.      Objective:   Physical Exam Pulse 102  Temp(Src) 98 F (36.7 C) (Oral)  Resp 18  SpO2 97%  LMP 09/08/2010 BP Readings from Last 3 Encounters:  09/20/15 122/82  08/16/15 126/82  07/19/15 124/84  VS noted,  Constitutional: Pt is oriented to person, place, and time. Appears well-developed and well-nourished, in no significant distress Head: Normocephalic and atraumatic  Eyes: Conjunctivae and EOM are normal. Pupils are equal, round, and reactive to light Right Ear: External ear normal.  Left Ear: External ear normal Nose: Nose normal.  Bilat tm's with mild erythema.  Max sinus  areas non tender.  Pharynx with mild erythema, no exudate Mouth/Throat: Oropharynx is clear and moist  Neck: Normal range of motion. Neck supple. No JVD present. No tracheal deviation present or significant neck LA or mass Cardiovascular: Normal rate, regular rhythm, normal heart sounds and intact distal pulses.   Pulmonary/Chest: Effort normal and breath sounds without rales or wheezing  Abdominal: Soft. Bowel sounds are normal. NT. No HSM  Musculoskeletal: Normal range of motion. Exhibits no edema Lymphadenopathy: Has no cervical adenopathy.  Neurological: Pt is alert and oriented to person, place, and time. Pt has normal reflexes. No cranial nerve deficit. Motor grossly intact Skin: Skin is warm and dry. No rash noted or new ulcers, numerous small moles to torso and extremities noted Psychiatric:  Has normal mood and affect. Behavior is normal.     Assessment & Plan:

## 2015-09-20 NOTE — Assessment & Plan Note (Signed)
stable overall by history and exam, recent data reviewed with pt, and pt to continue medical treatment as before,  to f/u any worsening symptoms or concerns BP Readings from Last 3 Encounters:  09/20/15 122/82  08/16/15 126/82  07/19/15 124/84

## 2015-09-20 NOTE — Progress Notes (Signed)
Pre visit review using our clinic review tool, if applicable. No additional management support is needed unless otherwise documented below in the visit note. 

## 2015-09-20 NOTE — Progress Notes (Signed)
Pamela [name] [address] [place], [place] [address] Phone: [phone] Subjective:    I'm seeing this patient by the request  of:  Pamela Barre, MD   CC: right ankle pain follow-up  [id number] Pamela [name] is a 60 y.o. 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 Patient did have a navicular bone that is fractured and displaced as well as surrounding myositis.   Patient has been on Fosamax and vitamin D for quite some time and continue to be on the Cam Walker. Has been decreasing the use of the boot. Patient has been now only wearing it a proximal he 4 hours a day. States that it is feeling good. Overall no worsening of pain. States that the swelling seems to be improving as well. No side effects to any of the medications.   Past Medical History  Diagnosis Date  . DIABETES MELLITUS, TYPE II 12/31/2007  . ANXIETY 12/31/2007  . DEPRESSION 12/31/2007  . HYPERTENSION 12/31/2007  . ALLERGIC RHINITIS 12/31/2007  . Right knee DJD   . Rosacea 10/11/2010  . Diastolic dysfunction 10/11/2010  . Atrial fibrillation (HCC)   . Migraine   . PNA (pneumonia)     in her 33s  . Hyperlipidemia 04/23/2012   Past Surgical History  Procedure Laterality Date  . 2 d&c      41s   Social History  Substance Use Topics  . Smoking status: Never Smoker   . Smokeless tobacco: None  . Alcohol Use: Yes     Comment: social   Allergies  Allergen Reactions  . Cymbalta [Duloxetine Hcl]     "loopiness"  . Gabapentin Itching   Family History  Problem Relation Age of Onset  . Arthritis Other   . Cancer Other     breast  . Heart disease Other   . Stroke Other   . Mental illness Other     emotional  . Diabetes Other       Past medical history, social, surgical and family  history all reviewed in electronic medical record.   Review of Systems: No headache, visual changes, nausea, vomiting, diarrhea, constipation, dizziness, abdominal pain, skin rash, fevers, chills, night sweats, weight loss, swollen lymph nodes, body aches, joint swelling, muscle aches, chest pain, shortness of breath, mood changes.   Objective Blood pressure 122/82, pulse 96, weight 340 lb (154.223 kg), last menstrual period 09/08/2010.  General: No apparent distress alert and oriented x3 mood and affect normal, dressed appropriately.  Morbid obesity.  HEENT: Pupils equal, extraocular movements intact  Respiratory: Patient's speak in full sentences and does not appear short of breath  Cardiovascular: No lower extremity edema, non tender, no erythema  Skin: Warm dry intact with no signs of infection or rash on extremities or on axial skeleton.  Abdomen: Soft nontender  Neuro: Cranial nerves II through XII are intact, neurovascularly intact in all extremities with 2+ DTRs and 2+ pulses.  Lymph: No lymphadenopathy of posterior or anterior cervical chain or axillae bilaterally.  Gait antalgic MSK:  Non tender with full range of motion and good stability and symmetric strength and tone of shoulders, elbows, wrist, hip, knee and bilaterally.  Exam shows no significant swelling at this time. Very minimal tenderness noted. Patient has still some lack of ankle range of motion lacking the last  5 of plantarflexion and dorsiflexion. Neurovascularly intact distally.  Impression and Recommendations:     This case required medical decision making of moderate complexity.

## 2015-09-20 NOTE — Assessment & Plan Note (Addendum)
Mild to mod, cw bornchitis vs pna, declines cxr, for antibx course, cough med prn,  to f/u any worsening symptoms or concerns  In addition to the time spent performing CPE, I spent an additional 40 minutes face to face,in which greater than 50% of this time was spent in counseling and coordination of care for patient's acute illness as documented.

## 2015-09-20 NOTE — Assessment & Plan Note (Signed)
stable overall by history and exam, recent data reviewed with pt, and pt to continue medical treatment as before,  to f/u any worsening symptoms or concerns Lab Results  Component Value Date   LDLCALC 53 04/12/2015    

## 2015-09-20 NOTE — Assessment & Plan Note (Addendum)
Much better at this time. We will female completely out of the boot. We discussed icing regimen. Home exercises. Patient is going to start wearing proper shoes. Will follow-up in 2 months of 14 symptoms. Titrate off the Fosamax and if symptoms we may need to go back on it. Continue vitamin D and family.

## 2015-09-20 NOTE — Assessment & Plan Note (Signed)

## 2015-09-21 ENCOUNTER — Other Ambulatory Visit: Payer: Self-pay | Admitting: Internal Medicine

## 2015-10-13 ENCOUNTER — Other Ambulatory Visit: Payer: Self-pay | Admitting: Family Medicine

## 2015-10-15 NOTE — Telephone Encounter (Signed)
Refill done.  

## 2015-10-20 ENCOUNTER — Other Ambulatory Visit: Payer: Self-pay | Admitting: Internal Medicine

## 2015-11-20 ENCOUNTER — Ambulatory Visit: Payer: Managed Care, Other (non HMO) | Admitting: Family Medicine

## 2015-11-26 ENCOUNTER — Other Ambulatory Visit (HOSPITAL_COMMUNITY): Payer: Self-pay | Admitting: Cardiology

## 2015-11-27 ENCOUNTER — Other Ambulatory Visit: Payer: Self-pay

## 2015-11-27 MED ORDER — POTASSIUM CHLORIDE CRYS ER 20 MEQ PO TBCR: 20.0000 meq | EXTENDED_RELEASE_TABLET | Freq: Every day | ORAL | Status: DC

## 2015-12-05 LAB — HM DIABETES EYE EXAM

## 2015-12-07 ENCOUNTER — Ambulatory Visit (HOSPITAL_COMMUNITY)
Admission: RE | Admit: 2015-12-07 | Discharge: 2015-12-07 | Disposition: A | Discharge status: Home / Self Care | Payer: Managed Care, Other (non HMO) | Source: Ambulatory Visit | Attending: Cardiology | Admitting: Cardiology

## 2015-12-07 ENCOUNTER — Encounter (HOSPITAL_COMMUNITY): Payer: Self-pay

## 2015-12-07 VITALS — BP 159/101 | HR 105 | Ht 72.0 in | Wt 339.1 lb

## 2015-12-07 DIAGNOSIS — E785 Hyperlipidemia, unspecified: Secondary | ICD-10-CM | POA: Insufficient documentation

## 2015-12-07 DIAGNOSIS — I110 Hypertensive heart disease with heart failure: Secondary | ICD-10-CM | POA: Diagnosis not present

## 2015-12-07 DIAGNOSIS — I5032 Chronic diastolic (congestive) heart failure: Secondary | ICD-10-CM | POA: Insufficient documentation

## 2015-12-07 DIAGNOSIS — Z79899 Other long term (current) drug therapy: Secondary | ICD-10-CM | POA: Insufficient documentation

## 2015-12-07 DIAGNOSIS — Z6841 Body Mass Index (BMI) 40.0 and over, adult: Secondary | ICD-10-CM | POA: Diagnosis not present

## 2015-12-07 DIAGNOSIS — Z7901 Long term (current) use of anticoagulants: Secondary | ICD-10-CM | POA: Diagnosis not present

## 2015-12-07 DIAGNOSIS — I482 Chronic atrial fibrillation, unspecified: Secondary | ICD-10-CM

## 2015-12-07 DIAGNOSIS — E669 Obesity, unspecified: Secondary | ICD-10-CM | POA: Insufficient documentation

## 2015-12-07 DIAGNOSIS — Z7984 Long term (current) use of oral hypoglycemic drugs: Secondary | ICD-10-CM | POA: Diagnosis not present

## 2015-12-07 DIAGNOSIS — E119 Type 2 diabetes mellitus without complications: Secondary | ICD-10-CM | POA: Insufficient documentation

## 2015-12-07 DIAGNOSIS — M1711 Unilateral primary osteoarthritis, right knee: Secondary | ICD-10-CM | POA: Insufficient documentation

## 2015-12-07 MED ORDER — METOPROLOL SUCCINATE ER 25 MG PO TB24: 75.0000 mg | ORAL_TABLET | Freq: Two times a day (BID) | ORAL | Status: DC

## 2015-12-07 NOTE — Patient Instructions (Signed)
INCREASE Toprol to 75 mg (3 tabs) twice a day  Your physician recommends that you schedule a follow-up appointment in: 6 months with Dr Shirlee LatchMcLean  Do the following things EVERYDAY: 1) Weigh yourself in the morning before breakfast. Write it down and keep it in a log. 2) Take your medicines as prescribed 3) Eat low salt foods-Limit salt (sodium) to 2000 mg per day.  4) Stay as active as you can everyday 5) Limit all fluids for the day to less than 2 liters 6)

## 2015-12-08 DIAGNOSIS — I482 Chronic atrial fibrillation, unspecified: Secondary | ICD-10-CM | POA: Insufficient documentation

## 2015-12-08 NOTE — Progress Notes (Signed)
Patient ID: Pamela [name], female   DOB: [birth year], 60 y.o.   MRN: [id number] PCP: Dr. Jacquelynn CreeJohn  Pamela [name] is a 60 y/o with h/o DM2, obesity, HTN, chronic Afib on Xarelto and chronic diastolic HF, echo 09/2010 with EF 55%.   She underwent cardiac cath in April 2012 which showed normal coronaries. Did very well with rate control and diuresis. Started on coumadin. We saw her in May and switched her to Xarelto due to problems regulating INRs. She underwent DC-CV in June 2012 which failed so we opted for rate control strategy.  She remains in atrial fibrillation.  Rate remains a bit high today, in the 100s.  She developed a stress fracture in her foot about 8 months ago and was non-weight bearing on her right foot until March.  She is now more mobile but feels like her energy level is poor and she is tired when walking up stairs.  Mainly complains of fatigue, no dyspnea.  BP is high today but she says that it is not elevated at home when she checks. No BRBPR or melena.  Weight is down 4 lbs.   Labs (6/14): Creatinine 1.3, Potassium 4.4, LDL 75, HDL 56, K 4.4, creatinine 1.3 Labs (3/15): K 4, creatinine 1.06, BNP 741 Labs (4/16): hgb 14.7 Labs (11/16): K 3.8, creatinine 1.25, LDL 83, HDL 47 Labs (4/17): K 4, creatinine 1.49, LDL 63, HDL 43  ROS: All systems negative except as listed in HPI, PMH and Problem List.  Past Medical History  Diagnosis Date  . DIABETES MELLITUS, TYPE II 12/31/2007  . ANXIETY 12/31/2007  . DEPRESSION 12/31/2007  . HYPERTENSION 12/31/2007  . ALLERGIC RHINITIS 12/31/2007  . Right knee DJD   . Rosacea 10/11/2010  . Diastolic dysfunction 10/11/2010  . Atrial fibrillation (HCC)   . Migraine   . PNA (pneumonia)     in her 6820s  . Hyperlipidemia 04/23/2012    Current Outpatient Prescriptions  Medication Sig Dispense Refill  . atorvastatin (LIPITOR) 10 MG tablet TAKE 1 TABLET BY MOUTH EVERY DAY 90 tablet 3  . cetirizine (ZYRTEC) 10 MG tablet Take 1 tablet (10 mg total) by mouth daily.  30 tablet 11  . diltiazem (CARDIZEM CD) 360 MG 24 hr capsule TAKE 1 CAPSULE(360 MG) BY MOUTH DAILY 30 capsule 0  . furosemide (LASIX) 20 MG tablet TAKE 2 TABLETS BY MOUTH EVERY MORNING, AND TAKE 1 TABLET EVERY EVENING 270 tablet 3  . glipiZIDE (GLUCOTROL XL) 10 MG 24 hr tablet Take 1 tablet (10 mg total) by mouth daily with breakfast. 90 tablet 3  . metFORMIN (GLUCOPHAGE) 1000 MG tablet TAKE 1 TABLET(1000 MG) BY MOUTH TWICE DAILY WITH A MEAL 180 tablet 3  . metoprolol succinate (TOPROL-XL) 25 MG 24 hr tablet Take 3 tablets (75 mg total) by mouth 2 (two) times daily. 540 tablet 3  . potassium chloride SA (K-DUR,KLOR-CON) 20 MEQ tablet Take 1 tablet (20 mEq total) by mouth daily. 30 tablet 0  . rivaroxaban (XARELTO) 20 MG TABS tablet TAKE 1 TABLET BY MOUTH EVERY DAY WITH SUPPER 30 tablet 9  . spironolactone (ALDACTONE) 25 MG tablet TAKE 1 TABLET BY MOUTH EVERY DAY 30 tablet 6  . Vitamin D, Ergocalciferol, (DRISDOL) 50000 units CAPS capsule TAKE 1 CAPSULE BY MOUTH EVERY 7 DAYS 12 capsule 0   No current facility-administered medications for this encounter.   PHYSICAL EXAM: Filed Vitals:   12/07/15 0917  BP: 159/101  Pulse: 105  Height: 6' (1.829 m)  Weight: 339 lb  1.9 oz (153.824 kg)  SpO2: 97%   General: Obese, NAD HEENT: normal Neck: supple. JVP 7 cm, Carotids 2+ bilaterally; no bruits. No lymphadenopathy or thryomegaly appreciated. Cor: PMI normal. Mildly tachy, irregular rate & rhythm. No rubs, gallops or murmurs.  No edema. Lungs: clear Abdomen: obese soft, nontender, nondistended.  Good bowel sounds. Extremities: no cyanosis, clubbing, rash. Neuro: alert & orientedx3, cranial nerves grossly intact. Moves all 4 extremities w/o difficulty. Affect pleasant.  ASSESSMENT & PLAN: 1. Atrial fibrillation: Chronic.  Rate remains elevated.  Continue current diltiazem CD.  Increase Toprol XL to 75 mg bid.  Continue Xarelto.   2. Chronic diastolic CHF: NYHA class II symptoms, volume stable.   Reports increased fatigue, but this may be due to inactivity/deconditioning in the setting of stress fracture in her foot with prolonged non-weight bearing on the right foot.   - Continue Lasix 40 qam, 20 qpm.  3. HTN: BP high today but has been ok when she checks at home.  As above, will be increasing Toprol XL.   Followup in 6 months.   Pamela [name] 12/08/2015

## 2015-12-23 ENCOUNTER — Other Ambulatory Visit (HOSPITAL_COMMUNITY): Payer: Self-pay | Admitting: Internal Medicine

## 2015-12-28 ENCOUNTER — Other Ambulatory Visit (HOSPITAL_COMMUNITY): Payer: Self-pay

## 2015-12-28 MED ORDER — POTASSIUM CHLORIDE CRYS ER 20 MEQ PO TBCR: 20.0000 meq | EXTENDED_RELEASE_TABLET | Freq: Every day | ORAL | Status: DC

## 2016-01-19 ENCOUNTER — Other Ambulatory Visit: Payer: Self-pay | Admitting: Family Medicine

## 2016-01-19 ENCOUNTER — Other Ambulatory Visit (HOSPITAL_COMMUNITY): Payer: Self-pay | Admitting: Internal Medicine

## 2016-01-21 NOTE — Telephone Encounter (Signed)
Refill done.  

## 2016-01-28 ENCOUNTER — Other Ambulatory Visit (HOSPITAL_COMMUNITY): Payer: Self-pay | Admitting: Internal Medicine

## 2016-01-28 ENCOUNTER — Other Ambulatory Visit: Payer: Self-pay | Admitting: Internal Medicine

## 2016-02-28 ENCOUNTER — Other Ambulatory Visit (HOSPITAL_COMMUNITY): Payer: Self-pay | Admitting: Internal Medicine

## 2016-03-03 ENCOUNTER — Other Ambulatory Visit (INDEPENDENT_AMBULATORY_CARE_PROVIDER_SITE_OTHER): Payer: Managed Care, Other (non HMO)

## 2016-03-03 DIAGNOSIS — E119 Type 2 diabetes mellitus without complications: Secondary | ICD-10-CM | POA: Diagnosis not present

## 2016-03-03 LAB — LIPID PANEL
Cholesterol: 134 mg/dL (ref 0–200)
HDL: 49.3 mg/dL (ref >39.00)
LDL Cholesterol: 49 mg/dL (ref 0–99)
NONHDL: 84.69
Total CHOL/HDL Ratio: 3
Triglycerides: 176 mg/dL — ABNORMAL HIGH (ref 0.0–149.0)
VLDL: 35.2 mg/dL (ref 0.0–40.0)

## 2016-03-03 LAB — BASIC METABOLIC PANEL
BUN: 13 mg/dL (ref 6–23)
CALCIUM: 9 mg/dL (ref 8.4–10.5)
CO2: 29 meq/L (ref 19–32)
Chloride: 98 mEq/L (ref 96–112)
Creatinine, Ser: 1.29 mg/dL — ABNORMAL HIGH (ref 0.40–1.20)
GFR: 44.81 mL/min — ABNORMAL LOW (ref >60.00)
Glucose, Bld: 261 mg/dL — ABNORMAL HIGH (ref 70–99)
Potassium: 3.6 mEq/L (ref 3.5–5.1)
SODIUM: 138 meq/L (ref 135–145)

## 2016-03-03 LAB — HEPATIC FUNCTION PANEL
ALK PHOS: 50 U/L (ref 39–117)
ALT: 22 U/L (ref 0–35)
AST: 14 U/L (ref 0–37)
Albumin: 3.8 g/dL (ref 3.5–5.2)
BILIRUBIN DIRECT: 0 mg/dL (ref 0.0–0.3)
TOTAL PROTEIN: 7.2 g/dL (ref 6.0–8.3)
Total Bilirubin: 0.3 mg/dL (ref 0.2–1.2)

## 2016-03-03 LAB — HEMOGLOBIN A1C: HEMOGLOBIN A1C: 9.4 % — AB (ref 4.6–6.5)

## 2016-03-04 ENCOUNTER — Other Ambulatory Visit (HOSPITAL_COMMUNITY): Payer: Self-pay | Admitting: *Deleted

## 2016-03-04 MED ORDER — RIVAROXABAN 20 MG PO TABS: ORAL_TABLET | ORAL | 9 refills | Status: DC

## 2016-03-18 ENCOUNTER — Ambulatory Visit (INDEPENDENT_AMBULATORY_CARE_PROVIDER_SITE_OTHER): Payer: Managed Care, Other (non HMO) | Admitting: Internal Medicine

## 2016-03-18 VITALS — BP 134/80 | HR 100 | Temp 98.0°F | Resp 20 | Wt 339.0 lb

## 2016-03-18 DIAGNOSIS — E785 Hyperlipidemia, unspecified: Secondary | ICD-10-CM | POA: Diagnosis not present

## 2016-03-18 DIAGNOSIS — Z0001 Encounter for general adult medical examination with abnormal findings: Secondary | ICD-10-CM

## 2016-03-18 DIAGNOSIS — G63 Polyneuropathy in diseases classified elsewhere: Secondary | ICD-10-CM

## 2016-03-18 DIAGNOSIS — E119 Type 2 diabetes mellitus without complications: Secondary | ICD-10-CM

## 2016-03-18 DIAGNOSIS — I10 Essential (primary) hypertension: Secondary | ICD-10-CM

## 2016-03-18 NOTE — Progress Notes (Signed)
Pre visit review using our clinic review tool, if applicable. No additional management support is needed unless otherwise documented below in the visit note. 

## 2016-03-18 NOTE — Progress Notes (Signed)
Subjective:    Patient ID: Pamela [name], female    DOB: June 19, 1955, 60 y.o.   MRN: [id number]  HPI  Here to f/u; overall doing ok,  Pt denies chest pain, increasing sob or doe, wheezing, orthopnea, PND, increased LE swelling, palpitations, dizziness or syncope.  Pt denies new neurological symptoms such as new headache, or facial or extremity weakness , but has had worsening LE uncomfortable discomfort for several months.  Pt denies polydipsia, polyuria, or low sugar episode.   Pt denies new neurological symptoms such as new headache, or facial or extremity weakness or numbness.   Pt states overall good compliance with meds, mostly trying to follow appropriate diet, with wt overall stable,  but little exercise however. Wt Readings from Last 3 Encounters:  03/18/16 (!) 339 lb (153.8 kg)  12/07/15 (!) 339 lb 1.9 oz (153.8 kg)  09/20/15 (!) 340 lb (154.2 kg)  wt stable but overall less active due to right ankle, wore boot for some weeks, sugars have gone up overall, and more neuropathy to distal legs, going up the legs.  Has several family members with DM with complications (maternal). Does not necessarily want more medication for tx, even ACE or ARB for renoprotection, had one stopped per cardiology x 4 yrs.    Has a hard to explain "dull ache" overall to "whole body", not really a pain, sort of a humming or throbbing. Mother had RA but she has no specific joint complaints except for worsening bilat knee arthritis.  BP Readings from Last 3 Encounters:  03/18/16 134/80  12/07/15 (!) 159/101  09/20/15 122/82   Past Medical History:  Diagnosis Date  . ALLERGIC RHINITIS 12/31/2007  . ANXIETY 12/31/2007  . Atrial fibrillation (HCC)   . DEPRESSION 12/31/2007  . DIABETES MELLITUS, TYPE II 12/31/2007  . Diastolic dysfunction 10/11/2010  . Hyperlipidemia 04/23/2012  . HYPERTENSION 12/31/2007  . Migraine   . PNA (pneumonia)    in her 12s  . Right knee DJD   . Rosacea 10/11/2010   Past Surgical  History:  Procedure Laterality Date  . 2 D&C     1980s    reports that she has never smoked. She does not have any smokeless tobacco history on file. She reports that she drinks alcohol. She reports that she does not use drugs. family history includes Arthritis in her other; Cancer in her other; Diabetes in her other; Heart disease in her other; Mental illness in her other; Stroke in her other. Allergies  Allergen Reactions  . Cymbalta [Duloxetine Hcl]     "loopiness"  . Gabapentin Itching   Current Outpatient Prescriptions on File Prior to Visit  Medication Sig Dispense Refill  . atorvastatin (LIPITOR) 10 MG tablet TAKE 1 TABLET BY MOUTH EVERY DAY 90 tablet 3  . cetirizine (ZYRTEC) 10 MG tablet TAKE 1 TABLET(10 MG) BY MOUTH DAILY 90 tablet 1  . diltiazem (CARDIZEM CD) 360 MG 24 hr capsule TAKE 1 CAPSULE(360 MG) BY MOUTH DAILY 30 capsule 0  . diltiazem (CARDIZEM CD) 360 MG 24 hr capsule TAKE ONE CAPSULE BY MOUTH EVERY DAY 90 capsule 0  . furosemide (LASIX) 20 MG tablet TAKE 2 TABLETS BY MOUTH EVERY MORNING, AND TAKE 1 TABLET EVERY EVENING 270 tablet 3  . furosemide (LASIX) 20 MG tablet TAKE 2 TABLETS EVERY MORNING AND 1 TABLET EVERY EVENING 270 tablet 3  . glipiZIDE (GLUCOTROL XL) 10 MG 24 hr tablet Take 1 tablet (10 mg total) by mouth daily with breakfast. 90  tablet 3  . metFORMIN (GLUCOPHAGE) 1000 MG tablet TAKE 1 TABLET(1000 MG) BY MOUTH TWICE DAILY WITH A MEAL 180 tablet 3  . metoprolol succinate (TOPROL-XL) 25 MG 24 hr tablet Take 3 tablets (75 mg total) by mouth 2 (two) times daily. 540 tablet 3  . potassium chloride SA (K-DUR,KLOR-CON) 20 MEQ tablet Take 1 tablet (20 mEq total) by mouth daily. 90 tablet 4  . rivaroxaban (XARELTO) 20 MG TABS tablet TAKE 1 TABLET BY MOUTH EVERY DAY WITH SUPPER 30 tablet 9  . spironolactone (ALDACTONE) 25 MG tablet TAKE 1 TABLET BY MOUTH EVERY DAY 30 tablet 3  . Vitamin D, Ergocalciferol, (DRISDOL) 50000 units CAPS capsule TAKE 1 CAPSULE BY MOUTH EVERY  7 DAYS 12 capsule 0   No current facility-administered medications on file prior to visit.    Review of Systems  Constitutional: Negative for unusual diaphoresis or night sweats HENT: Negative for ear swelling or discharge Eyes: Negative for worsening visual haziness  Respiratory: Negative for choking and stridor.   Gastrointestinal: Negative for distension or worsening eructation Genitourinary: Negative for retention or change in urine volume.  Musculoskeletal: Negative for other MSK pain or swelling Skin: Negative for color change and worsening wound Neurological: Negative for tremors and numbness other than noted  Psychiatric/Behavioral: Negative for decreased concentration or agitation other than above  '     Objective:   Physical Exam BP 134/80   Pulse 100   Temp 98 F (36.7 C) (Oral)   Resp 20   Wt (!) 339 lb (153.8 kg)   LMP 09/08/2010   SpO2 96%   BMI 45.98 kg/m  VS noted,  Constitutional: Pt appears in no apparent distress HENT: Head: NCAT.  Right Ear: External ear normal.  Left Ear: External ear normal.  Eyes: . Pupils are equal, round, and reactive to light. Conjunctivae and EOM are normal Neck: Normal range of motion. Neck supple.  Cardiovascular: Normal rate and regular rhythm.   Pulmonary/Chest: Effort normal and breath sounds without rales or wheezing.  Neurological: Pt is alert. Not confused , motor grossly intact, decreased Bilat sens LE's Skin: Skin is warm. No rash, no LE edema Psychiatric: Pt behavior is normal. No agitation.     Assessment & Plan:

## 2016-03-18 NOTE — Patient Instructions (Addendum)
Please call if you change your mind about starting the lantus 15 units per day  Please continue all other medications as before, and refills have been done if requested.  Please have the pharmacy call with any other refills you may need.  Please continue your efforts at being more active, low cholesterol diet, and weight control.  Please keep your appointments with your specialists as you may have planned  Please return in 6 months, or sooner if needed, with Lab testing done 3-5 days before

## 2016-03-21 ENCOUNTER — Ambulatory Visit: Payer: Managed Care, Other (non HMO) | Admitting: Internal Medicine

## 2016-03-22 NOTE — Assessment & Plan Note (Signed)
Mild to mod, to consider lyrica but unwilling now,  to f/u any worsening symptoms or concerns

## 2016-03-22 NOTE — Assessment & Plan Note (Signed)
Uncontrolled, but unwilling to consider intensification of tx such as victoza or insulin,, states "I know the situation and the consequences", cont same tx,  to f/u any worsening symptoms or concerns

## 2016-03-22 NOTE — Assessment & Plan Note (Signed)
stable overall by history and exam, recent data reviewed with pt, and pt to continue medical treatment as before,  to f/u any worsening symptoms or concerns Lab Results  Component Value Date   LDLCALC 49 03/03/2016

## 2016-03-22 NOTE — Assessment & Plan Note (Signed)
stable overall by history and exam, recent data reviewed with pt, and pt to continue medical treatment as before,  to f/u any worsening symptoms or concerns BP Readings from Last 3 Encounters:  03/18/16 134/80  12/07/15 (!) 159/101  09/20/15 122/82

## 2016-03-31 ENCOUNTER — Other Ambulatory Visit (HOSPITAL_COMMUNITY): Payer: Self-pay | Admitting: Internal Medicine

## 2016-04-26 ENCOUNTER — Other Ambulatory Visit: Payer: Self-pay | Admitting: Internal Medicine

## 2016-04-30 ENCOUNTER — Telehealth: Payer: Self-pay | Admitting: Emergency Medicine

## 2016-04-30 MED ORDER — VITAMIN D (ERGOCALCIFEROL) 1.25 MG (50000 UNIT) PO CAPS: ORAL_CAPSULE | ORAL | 0 refills | Status: DC

## 2016-04-30 NOTE — Telephone Encounter (Signed)
Pt has not been seen since 09/2015.

## 2016-04-30 NOTE — Telephone Encounter (Signed)
Pt made aware

## 2016-04-30 NOTE — Telephone Encounter (Signed)
Refilled but patient would need jto be seen before next refill

## 2016-04-30 NOTE — Telephone Encounter (Signed)
Pt called and wants to know if she can get a prescription refill on her Vitamin D, Ergocalciferol, (DRISDOL) 50000 units CAPS capsule. Pharmacy is [facility]. Please advise thanks.

## 2016-05-24 ENCOUNTER — Other Ambulatory Visit (HOSPITAL_COMMUNITY): Payer: Self-pay | Admitting: Internal Medicine

## 2016-06-17 ENCOUNTER — Ambulatory Visit (HOSPITAL_COMMUNITY)
Admission: RE | Admit: 2016-06-17 | Discharge: 2016-06-17 | Disposition: A | Discharge status: Home / Self Care | Payer: Managed Care, Other (non HMO) | Source: Ambulatory Visit | Attending: Cardiology | Admitting: Cardiology

## 2016-06-17 ENCOUNTER — Encounter (HOSPITAL_COMMUNITY): Payer: Self-pay

## 2016-06-17 VITALS — BP 153/86 | HR 98 | Wt 336.8 lb

## 2016-06-17 DIAGNOSIS — I4891 Unspecified atrial fibrillation: Secondary | ICD-10-CM | POA: Diagnosis not present

## 2016-06-17 DIAGNOSIS — F419 Anxiety disorder, unspecified: Secondary | ICD-10-CM | POA: Diagnosis not present

## 2016-06-17 DIAGNOSIS — E119 Type 2 diabetes mellitus without complications: Secondary | ICD-10-CM | POA: Diagnosis not present

## 2016-06-17 DIAGNOSIS — E785 Hyperlipidemia, unspecified: Secondary | ICD-10-CM | POA: Insufficient documentation

## 2016-06-17 DIAGNOSIS — I482 Chronic atrial fibrillation, unspecified: Secondary | ICD-10-CM

## 2016-06-17 DIAGNOSIS — I110 Hypertensive heart disease with heart failure: Secondary | ICD-10-CM | POA: Diagnosis present

## 2016-06-17 DIAGNOSIS — E669 Obesity, unspecified: Secondary | ICD-10-CM | POA: Insufficient documentation

## 2016-06-17 DIAGNOSIS — Z7984 Long term (current) use of oral hypoglycemic drugs: Secondary | ICD-10-CM | POA: Insufficient documentation

## 2016-06-17 DIAGNOSIS — Z7901 Long term (current) use of anticoagulants: Secondary | ICD-10-CM | POA: Diagnosis not present

## 2016-06-17 DIAGNOSIS — I5032 Chronic diastolic (congestive) heart failure: Secondary | ICD-10-CM | POA: Insufficient documentation

## 2016-06-17 DIAGNOSIS — L719 Rosacea, unspecified: Secondary | ICD-10-CM | POA: Diagnosis not present

## 2016-06-17 DIAGNOSIS — I4581 Long QT syndrome: Secondary | ICD-10-CM | POA: Diagnosis not present

## 2016-06-17 MED ORDER — METOPROLOL SUCCINATE ER 100 MG PO TB24: 100.0000 mg | ORAL_TABLET | Freq: Two times a day (BID) | ORAL | 3 refills | Status: DC

## 2016-06-17 NOTE — Patient Instructions (Signed)
Increase Metoprolol ER to 100 mg Twice daily   We will contact you in 6 months to schedule your next appointment.

## 2016-06-18 NOTE — Progress Notes (Signed)
Patient ID: Bethann GooLauren E Loflin, female   DOB: [birth year], 61 y.o.   MRN: [id number] PCP: Dr. Jacquelynn CreeJohn  Jalaysia is a 61 y/o with h/o DM2, obesity, HTN, chronic Afib on Xarelto and chronic diastolic HF, echo 09/2010 with EF 55%.   She underwent cardiac cath in April 2012 which showed normal coronaries. Did very well with rate control and diuresis. Started on coumadin. We saw her in May and switched her to Xarelto due to problems regulating INRs. She underwent DC-CV in June 2012 which failed so we opted for rate control strategy.  She remains in atrial fibrillation, rate controlled.  Weight down 3 lbs since last appointment.  Mild dyspnea walking up steps but no problems walking on flat ground.  No chest pain.  No melena/BRBPR.  BP high today.  No orthopnea/PND.   Labs (6/14): Creatinine 1.3, Potassium 4.4, LDL 75, HDL 56, K 4.4, creatinine 1.3 Labs (3/15): K 4, creatinine 1.06, BNP 741 Labs (4/16): hgb 14.7 Labs (11/16): K 3.8, creatinine 1.25, LDL 83, HDL 47 Labs (4/17): K 4, creatinine 1.49, LDL 63, HDL 43 Labs (9/17): K 3.6, creatinine 1.29, LDL 49, HDL 49  ECG: Atrial fibrillation in 90s, nonspecific T wave flattening  ROS: All systems negative except as listed in HPI, PMH and Problem List.  Past Medical History:  Diagnosis Date  . ALLERGIC RHINITIS 12/31/2007  . ANXIETY 12/31/2007  . Atrial fibrillation (HCC)   . DEPRESSION 12/31/2007  . DIABETES MELLITUS, TYPE II 12/31/2007  . Diastolic dysfunction 10/11/2010  . Hyperlipidemia 04/23/2012  . HYPERTENSION 12/31/2007  . Migraine   . PNA (pneumonia)    in her 6720s  . Right knee DJD   . Rosacea 10/11/2010    Current Outpatient Prescriptions  Medication Sig Dispense Refill  . atorvastatin (LIPITOR) 10 MG tablet TAKE 1 TABLET BY MOUTH EVERY DAY 90 tablet 3  . diltiazem (CARDIZEM CD) 360 MG 24 hr capsule TAKE 1 CAPSULE(360 MG) BY MOUTH DAILY 30 capsule 0  . furosemide (LASIX) 20 MG tablet TAKE 2 TABLETS BY MOUTH EVERY MORNING, AND TAKE 1 TABLET EVERY  EVENING 270 tablet 3  . glipiZIDE (GLUCOTROL XL) 10 MG 24 hr tablet TAKE 1 TABLET(10 MG) BY MOUTH DAILY WITH BREAKFAST 90 tablet 0  . metFORMIN (GLUCOPHAGE) 1000 MG tablet TAKE 1 TABLET(1000 MG) BY MOUTH TWICE DAILY WITH A MEAL 180 tablet 3  . metoprolol succinate (TOPROL-XL) 100 MG 24 hr tablet Take 1 tablet (100 mg total) by mouth 2 (two) times daily. 180 tablet 3  . potassium chloride SA (K-DUR,KLOR-CON) 20 MEQ tablet Take 1 tablet (20 mEq total) by mouth daily. 90 tablet 4  . rivaroxaban (XARELTO) 20 MG TABS tablet TAKE 1 TABLET BY MOUTH EVERY DAY WITH SUPPER 30 tablet 9  . spironolactone (ALDACTONE) 25 MG tablet TAKE 1 TABLET BY MOUTH EVERY DAY 30 tablet 0  . Vitamin D, Ergocalciferol, (DRISDOL) 50000 units CAPS capsule TAKE 1 CAPSULE BY MOUTH EVERY 7 DAYS 12 capsule 0   No current facility-administered medications for this encounter.    PHYSICAL EXAM: Vitals:   06/17/16 1510  BP: (!) 153/86  Pulse: 98  SpO2: 97%  Weight: (!) 336 lb 12.8 oz (152.8 kg)   General: Obese, NAD HEENT: normal Neck: supple. JVP 7 cm, Carotids 2+ bilaterally; no bruits. No lymphadenopathy or thryomegaly appreciated. Cor: PMI normal. Irregular rate & rhythm. No rubs, gallops or murmurs.  1+ ankle edema. Lungs: clear Abdomen: obese soft, nontender, nondistended.  Good bowel sounds. Extremities: no  cyanosis, clubbing, rash. Neuro: alert & orientedx3, cranial nerves grossly intact. Moves all 4 extremities w/o difficulty. Affect pleasant.  ASSESSMENT & PLAN: 1. Atrial fibrillation: Chronic.  Stable rate control.  Continue Xarelto, diltiazem CD, Toprol XL. 2. Chronic diastolic CHF: NYHA class II symptoms, volume stable.   - Continue Lasix 40 qam, 20 qpm.  3. HTN: BP elevated. - Increase Toprol XL to 100 mg bid.    Followup in 6 months.   Marca Ancona 06/18/2016

## 2016-06-21 ENCOUNTER — Other Ambulatory Visit (HOSPITAL_COMMUNITY): Payer: Self-pay | Admitting: Internal Medicine

## 2016-07-12 ENCOUNTER — Other Ambulatory Visit: Payer: Self-pay | Admitting: Family Medicine

## 2016-07-14 NOTE — Telephone Encounter (Signed)
Refill denied. Pt completed course of treatment.  

## 2016-07-19 ENCOUNTER — Other Ambulatory Visit: Payer: Self-pay | Admitting: Internal Medicine

## 2016-07-19 ENCOUNTER — Other Ambulatory Visit (HOSPITAL_COMMUNITY): Payer: Self-pay | Admitting: Internal Medicine

## 2016-07-26 ENCOUNTER — Other Ambulatory Visit: Payer: Self-pay | Admitting: Internal Medicine

## 2016-08-12 MED ORDER — CETIRIZINE HCL 10 MG PO TABS: 10.0000 mg | ORAL_TABLET | Freq: Every day | ORAL | 11 refills | Status: DC

## 2016-08-12 NOTE — Addendum Note (Signed)
Addended by: Corwin LevinsJOHN, Miroslava Santellan W on: 08/12/2016 12:28 PM   Modules accepted: Orders

## 2016-08-12 NOTE — Telephone Encounter (Signed)
Pt called saying that she needs this medication. Her allergies are acting up. Please advise.  Thanks [employer]

## 2016-08-12 NOTE — Telephone Encounter (Signed)
Done erx 

## 2016-10-18 ENCOUNTER — Other Ambulatory Visit: Payer: Self-pay | Admitting: Internal Medicine

## 2016-12-08 ENCOUNTER — Telehealth (HOSPITAL_COMMUNITY): Payer: Self-pay | Admitting: Vascular Surgery

## 2016-12-08 NOTE — Telephone Encounter (Signed)
Left pt message to move 8/17 appt [facility] will not be in office

## 2016-12-20 ENCOUNTER — Other Ambulatory Visit (HOSPITAL_COMMUNITY): Payer: Self-pay | Admitting: Internal Medicine

## 2016-12-22 ENCOUNTER — Other Ambulatory Visit (HOSPITAL_COMMUNITY): Payer: Self-pay | Admitting: *Deleted

## 2016-12-22 MED ORDER — SPIRONOLACTONE 25 MG PO TABS: 25.0000 mg | ORAL_TABLET | Freq: Every day | ORAL | 3 refills | Status: DC

## 2017-01-12 ENCOUNTER — Ambulatory Visit (HOSPITAL_COMMUNITY)
Admission: RE | Admit: 2017-01-12 | Discharge: 2017-01-12 | Disposition: A | Discharge status: Home / Self Care | Payer: Managed Care, Other (non HMO) | Source: Ambulatory Visit | Attending: Cardiology | Admitting: Cardiology

## 2017-01-12 VITALS — BP 164/75 | HR 90 | Wt 343.5 lb

## 2017-01-12 DIAGNOSIS — Z7984 Long term (current) use of oral hypoglycemic drugs: Secondary | ICD-10-CM | POA: Insufficient documentation

## 2017-01-12 DIAGNOSIS — I482 Chronic atrial fibrillation, unspecified: Secondary | ICD-10-CM

## 2017-01-12 DIAGNOSIS — F419 Anxiety disorder, unspecified: Secondary | ICD-10-CM | POA: Insufficient documentation

## 2017-01-12 DIAGNOSIS — L719 Rosacea, unspecified: Secondary | ICD-10-CM | POA: Diagnosis not present

## 2017-01-12 DIAGNOSIS — R0683 Snoring: Secondary | ICD-10-CM | POA: Diagnosis not present

## 2017-01-12 DIAGNOSIS — R5383 Other fatigue: Secondary | ICD-10-CM | POA: Diagnosis not present

## 2017-01-12 DIAGNOSIS — E669 Obesity, unspecified: Secondary | ICD-10-CM | POA: Diagnosis not present

## 2017-01-12 DIAGNOSIS — Z79899 Other long term (current) drug therapy: Secondary | ICD-10-CM | POA: Diagnosis not present

## 2017-01-12 DIAGNOSIS — E785 Hyperlipidemia, unspecified: Secondary | ICD-10-CM | POA: Insufficient documentation

## 2017-01-12 DIAGNOSIS — E119 Type 2 diabetes mellitus without complications: Secondary | ICD-10-CM | POA: Diagnosis not present

## 2017-01-12 DIAGNOSIS — I110 Hypertensive heart disease with heart failure: Secondary | ICD-10-CM | POA: Diagnosis not present

## 2017-01-12 DIAGNOSIS — F329 Major depressive disorder, single episode, unspecified: Secondary | ICD-10-CM | POA: Insufficient documentation

## 2017-01-12 DIAGNOSIS — Z7901 Long term (current) use of anticoagulants: Secondary | ICD-10-CM | POA: Insufficient documentation

## 2017-01-12 DIAGNOSIS — I5032 Chronic diastolic (congestive) heart failure: Secondary | ICD-10-CM | POA: Diagnosis not present

## 2017-01-12 LAB — BASIC METABOLIC PANEL WITH GFR
Anion gap: 15 (ref 5–15)
BUN: 15 mg/dL (ref 6–20)
CO2: 20 mmol/L — ABNORMAL LOW (ref 22–32)
Calcium: 9.3 mg/dL (ref 8.9–10.3)
Chloride: 101 mmol/L (ref 101–111)
Creatinine, Ser: 1.28 mg/dL — ABNORMAL HIGH (ref 0.44–1.00)
GFR calc Af Amer: 52 mL/min — ABNORMAL LOW
GFR calc non Af Amer: 45 mL/min — ABNORMAL LOW
Glucose, Bld: 249 mg/dL — ABNORMAL HIGH (ref 65–99)
Potassium: 3.7 mmol/L (ref 3.5–5.1)
Sodium: 136 mmol/L (ref 135–145)

## 2017-01-12 LAB — CBC
HEMATOCRIT: 39.2 % (ref 36.0–46.0)
Hemoglobin: 12.9 g/dL (ref 12.0–15.0)
MCH: 27.2 pg (ref 26.0–34.0)
MCHC: 32.9 g/dL (ref 30.0–36.0)
MCV: 82.5 fL (ref 78.0–100.0)
Platelets: 220 10*3/uL (ref 150–400)
RBC: 4.75 MIL/uL (ref 3.87–5.11)
RDW: 14.5 % (ref 11.5–15.5)
WBC: 10.5 10*3/uL (ref 4.0–10.5)

## 2017-01-12 LAB — TSH: TSH: 1.647 u[IU]/mL (ref 0.350–4.500)

## 2017-01-12 MED ORDER — LOSARTAN POTASSIUM 25 MG PO TABS: 25.0000 mg | ORAL_TABLET | Freq: Every day | ORAL | 6 refills | Status: DC

## 2017-01-12 NOTE — Patient Instructions (Signed)
Start Losartan 25 mg daily  Your physician has recommended that you have a sleep study. This test records several body functions during sleep, including: brain activity, eye movement, oxygen and carbon dioxide blood levels, heart rate and rhythm, breathing rate and rhythm, the flow of air through your mouth and nose, snoring, body muscle movements, and chest and belly movement.  We will contact you in 6 months to schedule your next appointment.

## 2017-01-13 NOTE — Progress Notes (Signed)
Patient ID: Pamela [name], female   DOB: 04-11-1956, 61 y.o.   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   She underwent cardiac cath in April 2012 which showed normal coronaries. Did very well with rate control and diuresis. Started on coumadin. We saw her in May and switched her to Xarelto due to problems regulating INRs. She underwent DC-CV in June 2012 which failed so we opted for rate control strategy.  She remains in atrial fibrillation, rate controlled.  She does not feel palpitations.  Weight is up 7 lbs since last appointment.  Mild dyspnea walking up steps but no problems walking on flat ground.  No chest pain.  No melena/BRBPR. BP again high today.  No orthopnea/PND.  She is tired during the day.  She is not sure if she snores at night.   Labs (6/14): Creatinine 1.3, Potassium 4.4, LDL 75, HDL 56, K 4.4, creatinine 1.3 Labs (3/15): K 4, creatinine 1.06, BNP 741 Labs (4/16): hgb 14.7 Labs (11/16): K 3.8, creatinine 1.25, LDL 83, HDL 47 Labs (4/17): K 4, creatinine 1.49, LDL 63, HDL 43 Labs (9/17): K 3.6, creatinine 1.29, LDL 49, HDL 49  ROS: All systems negative except as listed in HPI, PMH and Problem List.  Past Medical History:  Diagnosis Date  . ALLERGIC RHINITIS 12/31/2007  . ANXIETY 12/31/2007  . Atrial fibrillation (HCC)   . DEPRESSION 12/31/2007  . DIABETES MELLITUS, TYPE II 12/31/2007  . Diastolic dysfunction 10/11/2010  . Hyperlipidemia 04/23/2012  . HYPERTENSION 12/31/2007  . Migraine   . PNA (pneumonia)    in her 20s  . Right knee DJD   . Rosacea 10/11/2010    Current Outpatient Prescriptions  Medication Sig Dispense Refill  . atorvastatin (LIPITOR) 10 MG tablet TAKE 1 TABLET BY MOUTH EVERY DAY 90 tablet 1  . cetirizine (ZYRTEC) 10 MG tablet Take 1 tablet (10 mg total) by mouth daily. 30 tablet 11  . diltiazem (CARDIZEM CD) 360 MG 24 hr capsule TAKE 1  CAPSULE(360 MG) BY MOUTH DAILY 30 capsule 0  . furosemide (LASIX) 20 MG tablet TAKE 2 TABLETS BY MOUTH EVERY MORNING, AND TAKE 1 TABLET EVERY EVENING 270 tablet 3  . glipiZIDE (GLUCOTROL XL) 10 MG 24 hr tablet TAKE 1 TABLET(10 MG) BY MOUTH DAILY WITH BREAKFAST 90 tablet 1  . metFORMIN (GLUCOPHAGE) 1000 MG tablet TAKE 1 TABLET(1000 MG) BY MOUTH TWICE DAILY WITH A MEAL 180 tablet 1  . metoprolol succinate (TOPROL-XL) 100 MG 24 hr tablet Take 1 tablet (100 mg total) by mouth 2 (two) times daily. 180 tablet 3  . potassium chloride SA (K-DUR,KLOR-CON) 20 MEQ tablet Take 1 tablet (20 mEq total) by mouth daily. 90 tablet 4  . spironolactone (ALDACTONE) 25 MG tablet Take 1 tablet (25 mg total) by mouth daily. 90 tablet 3  . Vitamin D, Ergocalciferol, (DRISDOL) 50000 units CAPS capsule TAKE 1 CAPSULE BY MOUTH EVERY 7 DAYS 12 capsule 0  . XARELTO 20 MG TABS tablet TAKE 1 TABLET BY MOUTH EVERY DAY WITH SUPPER 30 tablet 0  . losartan (COZAAR) 25 MG tablet Take 1 tablet (25 mg total) by mouth daily. 90 tablet 6   No current facility-administered medications for this encounter.    PHYSICAL EXAM: Vitals:   01/12/17 1514  BP: (!) 164/75  Pulse: 90  SpO2: 95%  Weight: (!) 343 lb 8 oz (  155.8 kg)   General: NAD, obese Neck: Thick, no JVD, no thyromegaly or thyroid nodule.  Lungs: Clear to auscultation bilaterally with normal respiratory effort. CV: Nondisplaced PMI.  Heart regular S1/S2, no S3/S4, no murmur.  1+ ankle edema.  No carotid bruit.  Normal pedal pulses.  Abdomen: Soft, nontender, no hepatosplenomegaly, no distention.  Skin: Intact without lesions or rashes.  Neurologic: Alert and oriented x 3.  Psych: Normal affect. Extremities: No clubbing or cyanosis.  HEENT: Normal.   ASSESSMENT & PLAN: 1. Atrial fibrillation: Chronic.  Stable rate control.  Continue Xarelto, diltiazem CD, Toprol XL. - CBC today.  2. Chronic diastolic CHF: NYHA class II symptoms, volume stable.   - Continue Lasix 40  qam, 20 qpm.  BMET/BNP today.  3. HTN: BP elevated. - Add losartan 25 mg daily.  4. OSA: Suspect OSA.  I will arrange for a sleep study.   Followup 6 months.   Marca AnconaDalton McLean 01/13/2017

## 2017-01-17 ENCOUNTER — Other Ambulatory Visit (HOSPITAL_COMMUNITY): Payer: Self-pay | Admitting: Internal Medicine

## 2017-01-21 ENCOUNTER — Other Ambulatory Visit (HOSPITAL_COMMUNITY): Payer: Self-pay | Admitting: *Deleted

## 2017-01-21 MED ORDER — POTASSIUM CHLORIDE CRYS ER 20 MEQ PO TBCR: 20.0000 meq | EXTENDED_RELEASE_TABLET | Freq: Every day | ORAL | 4 refills | Status: DC

## 2017-01-23 ENCOUNTER — Encounter (HOSPITAL_COMMUNITY): Payer: Managed Care, Other (non HMO) | Admitting: Cardiology

## 2017-01-27 ENCOUNTER — Other Ambulatory Visit (HOSPITAL_COMMUNITY): Payer: Self-pay | Admitting: Cardiology

## 2017-01-27 MED ORDER — RIVAROXABAN 20 MG PO TABS: ORAL_TABLET | ORAL | 11 refills | Status: DC

## 2017-01-30 ENCOUNTER — Telehealth (HOSPITAL_COMMUNITY): Payer: Self-pay

## 2017-01-30 NOTE — Telephone Encounter (Signed)
Patient left VM on CHF clinic triage line requesting lab results from 2 weeks ago. Left return VM stating all labs WNL and that clinic policy only calls abnormal results.  Advised to return call for any further questions/concerns.  Ave Filter, RN

## 2017-02-08 ENCOUNTER — Other Ambulatory Visit: Payer: Self-pay | Admitting: Internal Medicine

## 2017-02-22 ENCOUNTER — Other Ambulatory Visit (HOSPITAL_COMMUNITY): Payer: Self-pay | Admitting: Internal Medicine

## 2017-02-22 ENCOUNTER — Other Ambulatory Visit: Payer: Self-pay | Admitting: Internal Medicine

## 2017-02-23 ENCOUNTER — Other Ambulatory Visit (HOSPITAL_COMMUNITY): Payer: Self-pay | Admitting: *Deleted

## 2017-02-23 MED ORDER — FUROSEMIDE 20 MG PO TABS: ORAL_TABLET | ORAL | 3 refills | Status: DC

## 2017-02-27 ENCOUNTER — Other Ambulatory Visit (INDEPENDENT_AMBULATORY_CARE_PROVIDER_SITE_OTHER): Payer: Managed Care, Other (non HMO)

## 2017-02-27 ENCOUNTER — Ambulatory Visit (INDEPENDENT_AMBULATORY_CARE_PROVIDER_SITE_OTHER)
Admission: RE | Admit: 2017-02-27 | Discharge: 2017-02-27 | Disposition: A | Discharge status: Home / Self Care | Payer: Managed Care, Other (non HMO) | Source: Ambulatory Visit | Attending: Family Medicine | Admitting: Family Medicine

## 2017-02-27 ENCOUNTER — Ambulatory Visit (INDEPENDENT_AMBULATORY_CARE_PROVIDER_SITE_OTHER): Payer: Managed Care, Other (non HMO) | Admitting: Family Medicine

## 2017-02-27 VITALS — BP 130/88 | HR 104 | Temp 98.4°F | Ht 72.0 in | Wt 347.1 lb

## 2017-02-27 DIAGNOSIS — L97521 Non-pressure chronic ulcer of other part of left foot limited to breakdown of skin: Secondary | ICD-10-CM

## 2017-02-27 DIAGNOSIS — E11610 Type 2 diabetes mellitus with diabetic neuropathic arthropathy: Secondary | ICD-10-CM

## 2017-02-27 DIAGNOSIS — E11621 Type 2 diabetes mellitus with foot ulcer: Secondary | ICD-10-CM | POA: Diagnosis not present

## 2017-02-27 DIAGNOSIS — G5791 Unspecified mononeuropathy of right lower limb: Secondary | ICD-10-CM

## 2017-02-27 DIAGNOSIS — M79671 Pain in right foot: Secondary | ICD-10-CM | POA: Diagnosis not present

## 2017-02-27 DIAGNOSIS — M792 Neuralgia and neuritis, unspecified: Secondary | ICD-10-CM

## 2017-02-27 LAB — HEMOGLOBIN A1C: HEMOGLOBIN A1C: 10.3 % — AB (ref 4.6–6.5)

## 2017-02-27 MED ORDER — TRAMADOL HCL 50 MG PO TABS: 50.0000 mg | ORAL_TABLET | Freq: Three times a day (TID) | ORAL | 0 refills | Status: DC | PRN

## 2017-02-27 MED ORDER — VITAMIN D (ERGOCALCIFEROL) 1.25 MG (50000 UNIT) PO CAPS: ORAL_CAPSULE | ORAL | 0 refills | Status: DC

## 2017-02-27 NOTE — Progress Notes (Signed)
Pamela [name] - 61 y.o. female MRN [id number]  Date of birth: 1955-09-02  SUBJECTIVE:  Including CC & ROS.  Chief Complaint  Patient presents with  . Foot Pain    Patient was Dx with stress Fx in her right foot over a year ago.  For the past month it has become worse and states that it feels like she is walking on something. She has been Tx with Tylenol ES and is not helping.    Ms. Pamela [name] is a 61 yo F that is presenting with acute on chronic right foot pain. She has a history of similar pain. Symptoms started about 6 weeks ago. She denies any injury. Standing seems to exacerbate her symptoms. She is not currently taking any medications for the pain. Reports to having neuropathy but is not taking any medication. Pain is throbbing and severe.   Has a long history of obesity. Has never consider bariatric surgery.  She also suffers from diabetes which is uncontrolled. Has not had her A1c checked in some time.    Patient was last seen by Dr. Katrinka Blazing on 09/20/15 for navicular fracture. She had been treated with Fosamax as well as vitamin D. She was also placed in a cam walker. She has had an MRI and an x-ray of her foot.  I have independently reviewed the MRI of her right foot from 05/12/15 showing a navicular fracture and neuropathic foot. X-ray of her right ankle from 04/19/15 shows degenerative changes about the ankle foot. Severe deformity noted with the navicular. 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  Hemoglobin A1c on 03/03/16 is 9.4.  Review of Systems  Constitutional: Negative for fever.  Respiratory: Negative for shortness of breath.   Cardiovascular: Negative for chest pain.  Gastrointestinal: Negative for vomiting.  Endocrine: Negative for polydipsia and polyuria.  Genitourinary: Negative for frequency.  Musculoskeletal: Positive for arthralgias, gait problem and joint swelling.  Skin: Negative for color change.  Neurological: Negative for weakness.    Hematological: Negative for adenopathy.  Psychiatric/Behavioral: Negative for behavioral problems.  otherwise negative   HISTORY: Past Medical, Surgical, Social, and Family History Reviewed & Updated per EMR.   Pertinent Historical Findings include:  Past Medical History:  Diagnosis Date  . ALLERGIC RHINITIS 12/31/2007  . ANXIETY 12/31/2007  . Atrial fibrillation (HCC)   . DEPRESSION 12/31/2007  . DIABETES MELLITUS, TYPE II 12/31/2007  . Diastolic dysfunction 10/11/2010  . Hyperlipidemia 04/23/2012  . HYPERTENSION 12/31/2007  . Migraine   . PNA (pneumonia)    in her 42s  . Right knee DJD   . Rosacea 10/11/2010    Past Surgical History:  Procedure Laterality Date  . 2 D&C     1980s    Allergies  Allergen Reactions  . Cymbalta [Duloxetine Hcl]     "loopiness"  . Gabapentin Itching    Family History  Problem Relation Age of Onset  . Arthritis Other   . Cancer Other        breast  . Heart disease Other   . Stroke Other   . Mental illness Other        emotional  . Diabetes Other      Social History   Social History  . Marital status: Single    Spouse name: N/A  . Number of children: 0  . Years of education: N/A   Occupational History  . CSR Customer serv rep [employer]   Social History Main Topics  . Smoking  status: Never Smoker  . Smokeless tobacco: Not on file  . Alcohol use Yes     Comment: social  . Drug use: No  . Sexual activity: Not on file   Other Topics Concern  . Not on file   Social History Narrative  . No narrative on file     PHYSICAL EXAM:  VS: BP 130/88 (BP Location: Left Arm, Patient Position: Sitting, Cuff Size: Large)   Pulse (!) 104   Temp 98.4 F (36.9 C) (Oral)   Ht 6' (1.829 m)   Wt (!) 347 lb 1.3 oz (157.4 kg)   LMP 09/08/2010   SpO2 97%   BMI 47.07 kg/m  Physical Exam Gen: NAD, alert, cooperative with exam, morbidly obese ENT: normal lips, normal nasal mucosa,  Eye: normal EOM, normal conjunctiva and lids CV:   no edema, +2 pedal pulses   Resp: no accessory muscle use, non-labored,  Skin: no rashes, no areas of induration  Neuro: normal tone, normal sensation to touch Psych:  normal insight, alert and oriented MSK:  Right foot:  Swelling on the lateral aspect of her hindfoot.  Pes cavus  Limited ROM  Left Foot:  Ulcer on dorsal aspect of left great toe. Doesn't appear to involve the dermis  No other ulcers apparent    Limited ultrasound: right foot:  Significant joint destruction of the lateral right foot.  Peroneal tendons are normal   Summary: significant change to suggest a charcot joint   Ultrasound and interpretation by Clare Gandy, MD          ASSESSMENT & PLAN:   Diabetes Uncontrolled. Has not had follow up in some time.  - A1c today  - discussed close follow up.   Diabetic ulcer of toe of left foot associated with type 2 diabetes mellitus, limited to breakdown of skin (HCC) Ulcer on her left foot appears to be fairly acute. Doesn't appear to be infected. Counseled on the importance of regular follow up for her DM and glycemic control.  - referral to wound center.   Neuropathic pain of foot Discussed started gabapentin. Upon chart review it appears she was unable to take gabapentin. Reported a history of intolerance to cymbalta. Doesn't want to try lyrica.   Charcot foot due to diabetes mellitus (HCC) Her foot has continued to deteriorate.  - tramadol  - CAM walker  - xrays foot  - will follow up in three weeks. May need to perform a bone scan. May need to start fosamax.  - may need to order diabetic shoes if doesn't have them  Obesity, Class III, BMI 40-49.9 (morbid obesity) (HCC) Discussed her weight and its contribution with diabetes to her problems. Her sister died from complications associated with her obesity and waiting to have bariatric surgery. Counseled on diet and weight loss.   - doesn't want to consider bariatric surgery ever - referral to  weight loss management classes could be consider going forward.

## 2017-02-27 NOTE — Patient Instructions (Signed)
Thank you for coming in,   We will call you with the results from today.   Please follow up with Dr. [name] for your diabetic care.    Please follow up with me in 3 weeks.     Please feel free to call with any questions or concerns at any time, at [phone]. --Dr. Jordan Likes

## 2017-02-28 DIAGNOSIS — L97521 Non-pressure chronic ulcer of other part of left foot limited to breakdown of skin: Secondary | ICD-10-CM

## 2017-02-28 DIAGNOSIS — E11621 Type 2 diabetes mellitus with foot ulcer: Secondary | ICD-10-CM | POA: Insufficient documentation

## 2017-02-28 DIAGNOSIS — E11610 Type 2 diabetes mellitus with diabetic neuropathic arthropathy: Secondary | ICD-10-CM | POA: Insufficient documentation

## 2017-02-28 NOTE — Assessment & Plan Note (Signed)
Discussed started gabapentin. Upon chart review it appears she was unable to take gabapentin. Reported a history of intolerance to cymbalta. Doesn't want to try lyrica.

## 2017-02-28 NOTE — Assessment & Plan Note (Signed)
Discussed her weight and its contribution with diabetes to her problems. Her sister died from complications associated with her obesity and waiting to have bariatric surgery. Counseled on diet and weight loss.   - doesn't want to consider bariatric surgery ever - referral to weight loss management classes could be consider going forward.

## 2017-02-28 NOTE — Assessment & Plan Note (Signed)
Ulcer on her left foot appears to be fairly acute. Doesn't appear to be infected. Counseled on the importance of regular follow up for her DM and glycemic control.  - referral to wound center.

## 2017-02-28 NOTE — Assessment & Plan Note (Signed)
Her foot has continued to deteriorate.  - tramadol  - CAM walker  - xrays foot  - will follow up in three weeks. May need to perform a bone scan. May need to start fosamax.  - may need to order diabetic shoes if doesn't have them

## 2017-02-28 NOTE — Assessment & Plan Note (Signed)
Uncontrolled. Has not had follow up in some time.  - A1c today  - discussed close follow up.

## 2017-03-02 ENCOUNTER — Telehealth: Payer: Self-pay | Admitting: Internal Medicine

## 2017-03-02 NOTE — Telephone Encounter (Signed)
Patient has an appointment for 9/25 . Forms given to Shirron for Dr.John to advise. Thank you.

## 2017-03-02 NOTE — Telephone Encounter (Signed)
Just FYI. Patient has not been seen by Dr.John since Oct 2017. She saw Dr.Schmitz on 9/21.

## 2017-03-02 NOTE — Telephone Encounter (Signed)
She will need to be seen since it has been close to a year since she has been in office.

## 2017-03-02 NOTE — Telephone Encounter (Signed)
Patient has dropped off FMLA forms to be filled out. She is requesting up to 3 or 4 days a month because of her feet issues. She states her job said she had a pattern of call outs. Forms have been given to CMA for MD to advise. Thank you.

## 2017-03-03 ENCOUNTER — Ambulatory Visit (INDEPENDENT_AMBULATORY_CARE_PROVIDER_SITE_OTHER): Payer: Managed Care, Other (non HMO) | Admitting: Internal Medicine

## 2017-03-03 ENCOUNTER — Encounter: Payer: Self-pay | Admitting: Internal Medicine

## 2017-03-03 VITALS — BP 124/84 | HR 93 | Temp 98.4°F | Ht 72.0 in | Wt 347.0 lb

## 2017-03-03 DIAGNOSIS — I10 Essential (primary) hypertension: Secondary | ICD-10-CM

## 2017-03-03 DIAGNOSIS — E11610 Type 2 diabetes mellitus with diabetic neuropathic arthropathy: Secondary | ICD-10-CM | POA: Diagnosis not present

## 2017-03-03 DIAGNOSIS — I482 Chronic atrial fibrillation, unspecified: Secondary | ICD-10-CM

## 2017-03-03 DIAGNOSIS — Z0001 Encounter for general adult medical examination with abnormal findings: Secondary | ICD-10-CM

## 2017-03-03 DIAGNOSIS — L97521 Non-pressure chronic ulcer of other part of left foot limited to breakdown of skin: Secondary | ICD-10-CM

## 2017-03-03 DIAGNOSIS — Z23 Encounter for immunization: Secondary | ICD-10-CM

## 2017-03-03 DIAGNOSIS — L97509 Non-pressure chronic ulcer of other part of unspecified foot with unspecified severity: Secondary | ICD-10-CM

## 2017-03-03 DIAGNOSIS — E11621 Type 2 diabetes mellitus with foot ulcer: Secondary | ICD-10-CM

## 2017-03-03 NOTE — Progress Notes (Signed)
Subjective:    Patient ID: Pamela [name], female    DOB: [birth year], 61 y.o.   MRN: [id number]  HPI  Here for wellness and f/u;  Overall doing ok;  Pt denies Chest pain, worsening SOB, DOE, wheezing, orthopnea, PND, worsening LE edema, palpitations, dizziness or syncope.  Pt denies neurological change such as new headache, facial or extremity weakness.  Pt denies polydipsia, polyuria, or low sugar symptoms. Pt states overall good compliance with treatment and medications, good tolerability, and has been trying to follow appropriate diet.  Pt denies worsening depressive symptoms, suicidal ideation or panic. No fever, night sweats, wt loss, loss of appetite, or other constitutional symptoms.  Pt states good ability with ADL's, has low fall risk, home safety reviewed and adequate, no other significant changes in hearing or vision, and not active with exercise and not interested, certainly not now as her right foot is casted.  Does incidentally have overall poorly controlled DM, and now has recent onset left great toe small medial very superficial ulceration without infection, has not seen podiatry  She will accept referral to endo.  Needs FMLA form filled out Past Medical History:  Diagnosis Date  . ALLERGIC RHINITIS 12/31/2007  . ANXIETY 12/31/2007  . Atrial fibrillation (HCC)   . DEPRESSION 12/31/2007  . DIABETES MELLITUS, TYPE II 12/31/2007  . Diastolic dysfunction 10/11/2010  . Hyperlipidemia 04/23/2012  . HYPERTENSION 12/31/2007  . Migraine   . PNA (pneumonia)    in her 46s  . Right knee DJD   . Rosacea 10/11/2010   Past Surgical History:  Procedure Laterality Date  . 2 D&C     1980s    reports that she has never smoked. She has never used smokeless tobacco. She reports that she drinks alcohol. She reports that she does not use drugs. family history includes Arthritis in her other; Cancer in her other; Diabetes in her other; Heart disease in her other; Mental illness in her other; Stroke in  her other. Allergies  Allergen Reactions  . Cymbalta [Duloxetine Hcl]     "loopiness"  . Gabapentin Itching   Current Outpatient Prescriptions on File Prior to Visit  Medication Sig Dispense Refill  . atorvastatin (LIPITOR) 10 MG tablet Take 1 tablet (10 mg total) by mouth daily. **PATIENT NEEDS PHYSICAL FOR ADDITIONAL REFILLS** 90 tablet 0  . cetirizine (ZYRTEC) 10 MG tablet Take 1 tablet (10 mg total) by mouth daily. 30 tablet 11  . diltiazem (CARDIZEM CD) 360 MG 24 hr capsule TAKE 1 CAPSULE(360 MG) BY MOUTH DAILY 30 capsule 0  . furosemide (LASIX) 20 MG tablet TAKE 2 TABLETS BY MOUTH EVERY MORNING, AND TAKE 1 TABLET EVERY EVENING 270 tablet 3  . glipiZIDE (GLUCOTROL XL) 10 MG 24 hr tablet Take 1 tablet (10 mg total) by mouth daily with breakfast. **PATIENT NEEDS PHYSICAL FOR ADDITIONAL REFILLS** 90 tablet 0  . losartan (COZAAR) 25 MG tablet Take 1 tablet (25 mg total) by mouth daily. 90 tablet 6  . metFORMIN (GLUCOPHAGE) 1000 MG tablet Take 1 tablet (1,000 mg total) by mouth 2 (two) times daily with a meal. **PT NEEDS APPOINTMENT FOR ADDITIONAL REFILLS** 180 tablet 0  . metoprolol succinate (TOPROL-XL) 100 MG 24 hr tablet Take 1 tablet (100 mg total) by mouth 2 (two) times daily. 180 tablet 3  . potassium chloride SA (K-DUR,KLOR-CON) 20 MEQ tablet Take 1 tablet (20 mEq total) by mouth daily. 90 tablet 4  . rivaroxaban (XARELTO) 20 MG TABS tablet TAKE 1 TABLET  BY MOUTH EVERY DAY WITH SUPPER 30 tablet 11  . spironolactone (ALDACTONE) 25 MG tablet Take 1 tablet (25 mg total) by mouth daily. 90 tablet 3  . traMADol (ULTRAM) 50 MG tablet Take 1 tablet (50 mg total) by mouth every 8 (eight) hours as needed. 30 tablet 0  . Vitamin D, Ergocalciferol, (DRISDOL) 50000 units CAPS capsule TAKE 1 CAPSULE BY MOUTH EVERY 7 DAYS 12 capsule 0   No current facility-administered medications on file prior to visit.    Review of Systems Constitutional: Negative for other unusual diaphoresis, sweats, appetite  or weight changes HENT: Negative for other worsening hearing loss, ear pain, facial swelling, mouth sores or neck stiffness.   Eyes: Negative for other worsening pain, redness or other visual disturbance.  Respiratory: Negative for other stridor or swelling Cardiovascular: Negative for other palpitations or other chest pain  Gastrointestinal: Negative for worsening diarrhea or loose stools, blood in stool, distention or other pain Genitourinary: Negative for hematuria, flank pain or other change in urine volume.  Musculoskeletal: Negative for myalgias or other joint swelling.  Skin: Negative for other color change, or other wound or worsening drainage.  Neurological: Negative for other syncope or numbness. Hematological: Negative for other adenopathy or swelling Psychiatric/Behavioral: Negative for hallucinations, other worsening agitation, SI, self-injury, or new decreased concentration All other system neg per pt    Objective:   Physical Exam BP 124/84   Pulse 93   Temp 98.4 F (36.9 C) (Oral)   Ht 6' (1.829 m)   Wt (!) 347 lb (157.4 kg)   LMP 09/08/2010   SpO2 98%   BMI 47.06 kg/m  VS noted,  Constitutional: Pt is oriented to person, place, and time. Appears well-developed and well-nourished, in no significant distress and comfortable Head: Normocephalic and atraumatic  Eyes: Conjunctivae and EOM are normal. Pupils are equal, round, and reactive to light Right Ear: External ear normal without discharge Left Ear: External ear normal without discharge Nose: Nose without discharge or deformity Mouth/Throat: Oropharynx is without other ulcerations and moist  Neck: Normal range of motion. Neck supple. No JVD present. No tracheal deviation present or significant neck LA or mass Cardiovascular: Normal rate, regular rhythm, normal heart sounds and intact distal pulses.   Pulmonary/Chest: WOB normal and breath sounds without rales or wheezing  Abdominal: Soft. Bowel sounds are normal.  NT. No HSM  Musculoskeletal: Normal range of motion. Exhibits no edema Lymphadenopathy: Has no other cervical adenopathy.  Neurological: Pt is alert and oriented to person, place, and time. Pt has normal reflexes. No cranial nerve deficit. Motor grossly intact, Gait intact Skin: Skin is warm and dry. No rash noted, but has 1-2 mm 1/2 cm ulceration to medial aspect left great toe over the distal joint Psychiatric:  Has normal mood and affect. Behavior is normal without agitation No other exam findings  Hemoglobin A1c  Order: [id number]  Status:  Final result Visible to patient:  No (Not Released) Next appt:  None Dx:  Type 2 diabetes mellitus with diabeti...    Ref Range & Units 4d ago (02/27/17) 4yr ago (03/03/16) 26yr ago (09/20/15) 25yr ago (04/12/15) 41yr ago (09/25/14)   Hgb A1c MFr Bld 4.6 - 6.5 % 10.3   9.4CM   9.1CM   9.8CM   10.8CM         Lab Results  Component Value Date   WBC 10.5 01/12/2017   HGB 12.9 01/12/2017   HCT 39.2 01/12/2017   PLT 220 01/12/2017  GLUCOSE 249 (H) 01/12/2017   CHOL 134 03/03/2016   TRIG 176.0 (H) 03/03/2016   HDL 49.30 03/03/2016   LDLDIRECT 120.3 04/22/2012   LDLCALC 49 03/03/2016   ALT 22 03/03/2016   AST 14 03/03/2016   NA 136 01/12/2017   K 3.7 01/12/2017   CL 101 01/12/2017   CREATININE 1.28 (H) 01/12/2017   BUN 15 01/12/2017   CO2 20 (L) 01/12/2017   TSH 1.647 01/12/2017   INR 1.18 12/13/2010   HGBA1C 10.3 (H) 02/27/2017   MICROALBUR 11.7 (H) 09/20/2015      Assessment & Plan:

## 2017-03-03 NOTE — Patient Instructions (Addendum)
You had the Prevnar 13 pneumonia shot today  Your form will be filled out soon  Please continue all other medications as before, and refills have been done if requested.  Please have the pharmacy call with any other refills you may need.  Please continue your efforts at being more active, low cholesterol diet, and weight control.  You are otherwise up to date with prevention measures today.  Please keep your appointments with your specialists as you may have planned  You will be contacted regarding the referral for: Endocrinology, and Podiatry  Please return in 1 year for your yearly visit, or sooner if needed, with Lab testing done 3-5 days before

## 2017-03-04 NOTE — Assessment & Plan Note (Signed)
Lab Results  Component Value Date   HGBA1C 10.3 (H) 02/27/2017  severe uncontrolled with new evolving complications including small left DFU, for endo referral

## 2017-03-04 NOTE — Assessment & Plan Note (Signed)
Stable rate and volume, FMLA to be filled out

## 2017-03-04 NOTE — Assessment & Plan Note (Addendum)
Very shallow 1-2 mm x 1/2 cm, no evidence of infection and should heal with appropriate care, for podiatry referral as well  In addition to the time spent performing CPE, I spent an additional 15 minutes face to face,in which greater than 50% of this time was spent in counseling and coordination of care for patient's illness as documented, including the differential dx, tx, further evaluation of DFU, DM, HTN and chronic afib

## 2017-03-04 NOTE — Assessment & Plan Note (Signed)
stable overall by history and exam, recent data reviewed with pt, and pt to continue medical treatment as before,  to f/u any worsening symptoms or concerns BP Readings from Last 3 Encounters:  03/03/17 124/84  02/27/17 130/88  01/12/17 (!) 164/75

## 2017-03-04 NOTE — Assessment & Plan Note (Signed)

## 2017-03-05 DIAGNOSIS — Z0279 Encounter for issue of other medical certificate: Secondary | ICD-10-CM

## 2017-03-08 ENCOUNTER — Encounter (HOSPITAL_BASED_OUTPATIENT_CLINIC_OR_DEPARTMENT_OTHER): Payer: Managed Care, Other (non HMO)

## 2017-03-21 ENCOUNTER — Other Ambulatory Visit: Payer: Self-pay | Admitting: Family Medicine

## 2017-03-21 DIAGNOSIS — M79671 Pain in right foot: Secondary | ICD-10-CM

## 2017-03-23 ENCOUNTER — Other Ambulatory Visit: Payer: Self-pay | Admitting: Internal Medicine

## 2017-04-08 ENCOUNTER — Other Ambulatory Visit: Payer: Self-pay | Admitting: Internal Medicine

## 2017-04-19 ENCOUNTER — Other Ambulatory Visit: Payer: Self-pay | Admitting: Family Medicine

## 2017-04-19 DIAGNOSIS — M79671 Pain in right foot: Secondary | ICD-10-CM

## 2017-04-20 ENCOUNTER — Other Ambulatory Visit (HOSPITAL_COMMUNITY): Payer: Self-pay | Admitting: *Deleted

## 2017-04-20 MED ORDER — METOPROLOL SUCCINATE ER 100 MG PO TB24: 100.0000 mg | ORAL_TABLET | Freq: Two times a day (BID) | ORAL | 3 refills | Status: DC

## 2017-05-16 ENCOUNTER — Other Ambulatory Visit: Payer: Self-pay | Admitting: Family Medicine

## 2017-05-16 DIAGNOSIS — M79671 Pain in right foot: Secondary | ICD-10-CM

## 2017-06-16 ENCOUNTER — Ambulatory Visit: Payer: Managed Care, Other (non HMO) | Admitting: Family Medicine

## 2017-06-17 ENCOUNTER — Ambulatory Visit: Payer: Managed Care, Other (non HMO) | Admitting: Family

## 2017-06-17 ENCOUNTER — Encounter: Payer: Self-pay | Admitting: Family

## 2017-06-17 VITALS — BP 128/78 | HR 81 | Temp 98.5°F | Ht 72.0 in | Wt 333.0 lb

## 2017-06-17 DIAGNOSIS — L309 Dermatitis, unspecified: Secondary | ICD-10-CM | POA: Diagnosis not present

## 2017-06-17 DIAGNOSIS — J209 Acute bronchitis, unspecified: Secondary | ICD-10-CM | POA: Diagnosis not present

## 2017-06-17 DIAGNOSIS — J9801 Acute bronchospasm: Secondary | ICD-10-CM | POA: Diagnosis not present

## 2017-06-17 MED ORDER — DOXYCYCLINE HYCLATE 100 MG PO TABS: 100.0000 mg | ORAL_TABLET | Freq: Two times a day (BID) | ORAL | 0 refills | Status: DC

## 2017-06-17 MED ORDER — MOMETASONE FUROATE 0.1 % EX CREA: 1.0000 "application " | TOPICAL_CREAM | Freq: Two times a day (BID) | CUTANEOUS | 0 refills | Status: DC

## 2017-06-17 MED ORDER — BENZONATATE 200 MG PO CAPS: 200.0000 mg | ORAL_CAPSULE | Freq: Three times a day (TID) | ORAL | 0 refills | Status: DC | PRN

## 2017-06-17 MED ORDER — ALBUTEROL SULFATE HFA 108 (90 BASE) MCG/ACT IN AERS: 2.0000 | INHALATION_SPRAY | Freq: Four times a day (QID) | RESPIRATORY_TRACT | 0 refills | Status: DC | PRN

## 2017-06-17 NOTE — Progress Notes (Signed)
Pamela [name] is a 62 y.o. female with the following history as recorded in [employer]:  Patient Active Problem List   Diagnosis Date Noted  . Diabetic foot ulcer (HCC) 03/03/2017  . Charcot foot due to diabetes mellitus (HCC) 02/28/2017  . Diabetic ulcer of toe of left foot associated with type 2 diabetes mellitus, limited to breakdown of skin (HCC) 02/28/2017  . Obesity, Class III, BMI 40-49.9 (morbid obesity) (HCC) 02/28/2017  . Chronic atrial fibrillation (HCC) 12/08/2015  . Navicular fracture of ankle with malunion 05/30/2015  . Neuropathic pain of foot 05/30/2015  . Navicular fracture, foot 05/02/2015  . Right ankle pain 04/19/2015  . Cough 11/17/2014  . Fatigue 09/25/2014  . Ganglion cyst 02/06/2014  . Bilateral hand pain 11/29/2012  . Anemia, unspecified 04/23/2012  . Renal insufficiency 04/23/2012  . Hyperlipidemia 04/23/2012  . Peripheral neuropathy 01/16/2012  . DJD (degenerative joint disease) of knee 04/15/2011  . Migraine 04/15/2011  . Chronic diastolic heart failure (HCC) 10/28/2010  . Rosacea 10/11/2010  . Anticoagulated 10/11/2010  . Atrial fibrillation (HCC) 10/07/2010  . Encounter for preventative adult health care exam with abnormal findings 10/06/2010  . Diabetes (HCC) 12/31/2007  . ANXIETY 12/31/2007  . DEPRESSION 12/31/2007  . Essential hypertension 12/31/2007  . ALLERGIC RHINITIS 12/31/2007    Current Outpatient Medications  Medication Sig Dispense Refill  . atorvastatin (LIPITOR) 10 MG tablet TAKE 1 TABLET BY MOUTH EVERY DAY 90 tablet 3  . cetirizine (ZYRTEC) 10 MG tablet Take 1 tablet (10 mg total) by mouth daily. 30 tablet 11  . diltiazem (CARDIZEM CD) 360 MG 24 hr capsule TAKE 1 CAPSULE(360 MG) BY MOUTH DAILY 30 capsule 0  . furosemide (LASIX) 20 MG tablet TAKE 2 TABLETS BY MOUTH EVERY MORNING, AND TAKE 1 TABLET EVERY EVENING 270 tablet 3  . glipiZIDE (GLUCOTROL XL) 10 MG 24 hr tablet TAKE 1 TABLET(10 MG) BY MOUTH DAILY WITH BREAKFAST 90 tablet 3   . metFORMIN (GLUCOPHAGE) 1000 MG tablet Take 1 tablet (1,000 mg total) by mouth 2 (two) times daily with a meal. **PT NEEDS APPOINTMENT FOR ADDITIONAL REFILLS** 180 tablet 0  . metoprolol succinate (TOPROL-XL) 100 MG 24 hr tablet Take 1 tablet (100 mg total) 2 (two) times daily by mouth. 180 tablet 3  . potassium chloride SA (K-DUR,KLOR-CON) 20 MEQ tablet Take 1 tablet (20 mEq total) by mouth daily. 90 tablet 4  . rivaroxaban (XARELTO) 20 MG TABS tablet TAKE 1 TABLET BY MOUTH EVERY DAY WITH SUPPER 30 tablet 11  . spironolactone (ALDACTONE) 25 MG tablet Take 1 tablet (25 mg total) by mouth daily. 90 tablet 3  . traMADol (ULTRAM) 50 MG tablet Take 1 tablet (50 mg total) by mouth every 8 (eight) hours as needed. 30 tablet 0  . Vitamin D, Ergocalciferol, (DRISDOL) 50000 units CAPS capsule TAKE 1 CAPSULE BY MOUTH EVERY 7 DAYS 12 capsule 0  . albuterol (PROVENTIL HFA;VENTOLIN HFA) 108 (90 Base) MCG/ACT inhaler Inhale 2 puffs into the lungs every 6 (six) hours as needed for wheezing or shortness of breath. 1 Inhaler 0  . benzonatate (TESSALON) 200 MG capsule Take 1 capsule (200 mg total) by mouth 3 (three) times daily as needed for cough. 20 capsule 0  . doxycycline (VIBRA-TABS) 100 MG tablet Take 1 tablet (100 mg total) by mouth 2 (two) times daily. 20 tablet 0  . mometasone (ELOCON) 0.1 % cream Apply 1 application topically 2 (two) times daily. 45 g 0   No current facility-administered medications for this  visit.     Allergies: Cymbalta [duloxetine hcl] and Gabapentin  Past Medical History:  Diagnosis Date  . ALLERGIC RHINITIS 12/31/2007  . ANXIETY 12/31/2007  . Atrial fibrillation (HCC)   . DEPRESSION 12/31/2007  . DIABETES MELLITUS, TYPE II 12/31/2007  . Diastolic dysfunction 10/11/2010  . Hyperlipidemia 04/23/2012  . HYPERTENSION 12/31/2007  . Migraine   . PNA (pneumonia)    in her 3220s  . Right knee DJD   . Rosacea 10/11/2010    Past Surgical History:  Procedure Laterality Date  . 2 D&C      1980s    Family History  Problem Relation Age of Onset  . Arthritis Other   . Cancer Other        breast  . Heart disease Other   . Stroke Other   . Mental illness Other        emotional  . Diabetes Other     Social History   Tobacco Use  . Smoking status: Never Smoker  . Smokeless tobacco: Never Used  Substance Use Topics  . 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 Eczema patch noted on top of right hand;  Head: Normocephalic and atraumatic  Eyes: Sclera and conjunctiva clear; pupils round and reactive to light; extraocular movements intact  Ears: External normal; canals clear; tympanic membranes normal  Oropharynx: Pink, supple. No suspicious lesions  Neck: Supple without thyromegaly, adenopathy  Lungs: Respirations unlabored; clear to auscultation bilaterally without wheeze, rales, rhonchi  CVS exam: normal rate and regular rhythm.  Extremities: No edema, cyanosis, clubbing  Vessels: Symmetric bilaterally  Neurologic: Alert and oriented; speech intact; face symmetrical; moves all extremities well; CNII-XII intact without focal deficit   Assessment:  1. Acute bronchitis, unspecified organism   2. Acute bronchospasm   3. Eczema, unspecified type     Plan:  1. & 2.  Rx for Doxycycline 100 mg bid x 10 days; rx for albuterol and Tessalon Perles; increase fluids, rest and follow-up worse, no better; if eye congestion persists, will add eye drops; may also need CXR. Work note given;  3. Rx for Elocon cream apply bid to affected area;    No Follow-up on file.  No orders of the defined types were placed in this encounter.   Requested Prescriptions   Signed Prescriptions Disp Refills  . doxycycline (VIBRA-TABS) 100 MG tablet 20 tablet 0    Sig: Take 1 tablet (100 mg total) by mouth 2 (two) times daily.  Marland Kitchen. albuterol (PROVENTIL HFA;VENTOLIN HFA) 108 (90 Base) MCG/ACT inhaler 1 Inhaler 0    Sig: Inhale 2 puffs into the lungs every 6 (six) hours as needed for wheezing  or shortness of breath.  . benzonatate (TESSALON) 200 MG capsule 20 capsule 0    Sig: Take 1 capsule (200 mg total) by mouth 3 (three) times daily as needed for cough.  . mometasone (ELOCON) 0.1 % cream 45 g 0    Sig: Apply 1 application topically 2 (two) times daily.

## 2017-07-10 ENCOUNTER — Encounter: Payer: Self-pay | Admitting: Endocrinology

## 2017-07-18 ENCOUNTER — Other Ambulatory Visit: Payer: Self-pay | Admitting: Internal Medicine

## 2017-08-11 ENCOUNTER — Encounter (HOSPITAL_COMMUNITY): Payer: Self-pay | Admitting: Cardiology

## 2017-08-11 ENCOUNTER — Ambulatory Visit (HOSPITAL_COMMUNITY)
Admission: RE | Admit: 2017-08-11 | Discharge: 2017-08-11 | Disposition: A | Discharge status: Home / Self Care | Payer: Managed Care, Other (non HMO) | Source: Ambulatory Visit | Attending: Cardiology | Admitting: Cardiology

## 2017-08-11 VITALS — BP 147/87 | HR 98 | Wt 339.1 lb

## 2017-08-11 DIAGNOSIS — I482 Chronic atrial fibrillation, unspecified: Secondary | ICD-10-CM

## 2017-08-11 DIAGNOSIS — E785 Hyperlipidemia, unspecified: Secondary | ICD-10-CM | POA: Insufficient documentation

## 2017-08-11 DIAGNOSIS — I5032 Chronic diastolic (congestive) heart failure: Secondary | ICD-10-CM | POA: Insufficient documentation

## 2017-08-11 DIAGNOSIS — Z7901 Long term (current) use of anticoagulants: Secondary | ICD-10-CM | POA: Insufficient documentation

## 2017-08-11 DIAGNOSIS — E119 Type 2 diabetes mellitus without complications: Secondary | ICD-10-CM | POA: Insufficient documentation

## 2017-08-11 DIAGNOSIS — Z79899 Other long term (current) drug therapy: Secondary | ICD-10-CM | POA: Insufficient documentation

## 2017-08-11 DIAGNOSIS — E669 Obesity, unspecified: Secondary | ICD-10-CM | POA: Insufficient documentation

## 2017-08-11 DIAGNOSIS — I110 Hypertensive heart disease with heart failure: Secondary | ICD-10-CM | POA: Insufficient documentation

## 2017-08-11 DIAGNOSIS — Z7984 Long term (current) use of oral hypoglycemic drugs: Secondary | ICD-10-CM | POA: Diagnosis not present

## 2017-08-11 LAB — LIPID PANEL
CHOL/HDL RATIO: 3.4 ratio
Cholesterol: 145 mg/dL (ref 0–200)
HDL: 43 mg/dL (ref >40)
LDL CALC: 53 mg/dL (ref 0–99)
Triglycerides: 245 mg/dL — ABNORMAL HIGH (ref <150)
VLDL: 49 mg/dL — AB (ref 0–40)

## 2017-08-11 LAB — BASIC METABOLIC PANEL
Anion gap: 14 (ref 5–15)
BUN: 18 mg/dL (ref 6–20)
CHLORIDE: 100 mmol/L — AB (ref 101–111)
CO2: 22 mmol/L (ref 22–32)
Calcium: 9.2 mg/dL (ref 8.9–10.3)
Creatinine, Ser: 1.46 mg/dL — ABNORMAL HIGH (ref 0.44–1.00)
GFR calc non Af Amer: 38 mL/min — ABNORMAL LOW (ref >60)
GFR, EST AFRICAN AMERICAN: 44 mL/min — AB (ref >60)
Glucose, Bld: 324 mg/dL — ABNORMAL HIGH (ref 65–99)
POTASSIUM: 4.1 mmol/L (ref 3.5–5.1)
Sodium: 136 mmol/L (ref 135–145)

## 2017-08-11 LAB — CBC
HEMATOCRIT: 37.9 % (ref 36.0–46.0)
HEMOGLOBIN: 12.1 g/dL (ref 12.0–15.0)
MCH: 27.1 pg (ref 26.0–34.0)
MCHC: 31.9 g/dL (ref 30.0–36.0)
MCV: 84.8 fL (ref 78.0–100.0)
Platelets: 260 10*3/uL (ref 150–400)
RBC: 4.47 MIL/uL (ref 3.87–5.11)
RDW: 15.1 % (ref 11.5–15.5)
WBC: 10.1 10*3/uL (ref 4.0–10.5)

## 2017-08-11 NOTE — Progress Notes (Signed)
Patient ID: Pamela [name], female   DOB: 12-09-1955, 62 y.o.   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   She underwent cardiac cath in April 2012 which showed normal coronaries. Did very well with rate control and diuresis. Started on coumadin. We saw her in May and switched her to Xarelto due to problems regulating INRs. She underwent DC-CV in June 2012 which failed so we opted for rate control strategy.  She returns for followup of atrial fibrillation and diastolic CHF.  She remains in atrial fibrillation, rate controlled.  She does not feel palpitations.  Weight is down 4 lbs since last appointment.  Mild dyspnea walking up stairs or walking about 100 yards or so.  She has occasional atypical, nonexertional chest tightness => bandlike, not exertional.  She has bilateral knee pain from OA.  No BRBPR/melena.  BP elevated today, but SBP in 120s at other recent appts.  She has significant daytime sleepiness, has not had sleep study.   Labs (6/14): Creatinine 1.3, Potassium 4.4, LDL 75, HDL 56, K 4.4, creatinine 1.3 Labs (3/15): K 4, creatinine 1.06, BNP 741 Labs (4/16): hgb 14.7 Labs (11/16): K 3.8, creatinine 1.25, LDL 83, HDL 47 Labs (4/17): K 4, creatinine 1.49, LDL 63, HDL 43 Labs (9/17): K 3.6, creatinine 1.29, LDL 49, HDL 49 Labs (8/18): TSH normal, Hgb 12.9, K 3.7, creatinine 1.29  ROS: All systems negative except as listed in HPI, PMH and Problem List.  Past Medical History:  Diagnosis Date  . ALLERGIC RHINITIS 12/31/2007  . ANXIETY 12/31/2007  . Atrial fibrillation (HCC)   . DEPRESSION 12/31/2007  . DIABETES MELLITUS, TYPE II 12/31/2007  . Diastolic dysfunction 10/11/2010  . Hyperlipidemia 04/23/2012  . HYPERTENSION 12/31/2007  . Migraine   . PNA (pneumonia)    in her 29s  . Right knee DJD   . Rosacea 10/11/2010    Current Outpatient Medications  Medication Sig Dispense  Refill  . albuterol (PROVENTIL HFA;VENTOLIN HFA) 108 (90 Base) MCG/ACT inhaler Inhale 2 puffs into the lungs every 6 (six) hours as needed for wheezing or shortness of breath. 1 Inhaler 0  . atorvastatin (LIPITOR) 10 MG tablet TAKE 1 TABLET BY MOUTH EVERY DAY 90 tablet 3  . cetirizine (ZYRTEC) 10 MG tablet Take 1 tablet (10 mg total) by mouth daily. 30 tablet 11  . diltiazem (CARDIZEM CD) 360 MG 24 hr capsule TAKE 1 CAPSULE(360 MG) BY MOUTH DAILY 30 capsule 0  . furosemide (LASIX) 20 MG tablet TAKE 2 TABLETS BY MOUTH EVERY MORNING, AND TAKE 1 TABLET EVERY EVENING 270 tablet 3  . glipiZIDE (GLUCOTROL XL) 10 MG 24 hr tablet TAKE 1 TABLET(10 MG) BY MOUTH DAILY WITH BREAKFAST 90 tablet 3  . losartan (COZAAR) 25 MG tablet Take 25 mg by mouth daily.    . metFORMIN (GLUCOPHAGE) 1000 MG tablet TAKE 1 TABLET(1000 MG) BY MOUTH TWICE DAILY WITH A MEAL 180 tablet 2  . metoprolol succinate (TOPROL-XL) 100 MG 24 hr tablet Take 1 tablet (100 mg total) 2 (two) times daily by mouth. 180 tablet 3  . mometasone (ELOCON) 0.1 % cream Apply 1 application topically 2 (two) times daily. 45 g 0  . potassium chloride SA (K-DUR,KLOR-CON) 20 MEQ tablet Take 1 tablet (20 mEq total) by mouth daily. 90 tablet 4  . rivaroxaban (XARELTO) 20 MG TABS tablet TAKE 1 TABLET BY MOUTH  EVERY DAY WITH SUPPER 30 tablet 11  . spironolactone (ALDACTONE) 25 MG tablet Take 1 tablet (25 mg total) by mouth daily. 90 tablet 3  . traMADol (ULTRAM) 50 MG tablet Take 1 tablet (50 mg total) by mouth every 8 (eight) hours as needed. 30 tablet 0  . Vitamin D, Ergocalciferol, (DRISDOL) 50000 units CAPS capsule TAKE 1 CAPSULE BY MOUTH EVERY 7 DAYS (Patient not taking: Reported on 08/11/2017) 12 capsule 0   No current facility-administered medications for this encounter.    PHYSICAL EXAM: Vitals:   08/11/17 1522  BP: (!) 147/87  Pulse: 98  SpO2: 98%  Weight: (!) 339 lb 1.9 oz (153.8 kg)   General: NAD, obese Neck: Thick, no JVD, no thyromegaly or  thyroid nodule.  Lungs: Clear to auscultation bilaterally with normal respiratory effort. CV: Nondisplaced PMI.  Heart irregular S1/S2, no S3/S4, no murmur.  Trace ankle edema.  No carotid bruit.  Normal pedal pulses.  Abdomen: Soft, nontender, no hepatosplenomegaly, no distention.  Skin: Intact without lesions or rashes.  Neurologic: Alert and oriented x 3.  Psych: Normal affect. Extremities: No clubbing or cyanosis.  HEENT: Normal.   ASSESSMENT & PLAN: 1. Atrial fibrillation: Chronic.  Stable rate control.   - Continue Xarelto, diltiazem CD, Toprol XL. - CBC today.  2. Chronic diastolic CHF: NYHA class II symptoms, she does not appear volume overloaded on exam.  Mildly worsened dyspnea over the last year.  - Continue Lasix 40 qam, 20 qpm.  BMET today.  - I am going to arrange for an echo to reassess LV and RV function.  3. HTN: BP elevated in office today but has been normal at other appts.  Will hold off on increasing meds for now.   4. OSA: I again recommended a sleep study, she [facility] her.  5. Obesity: I again recommended diet and exercise for weight loss.  6. Hyperlipidemia: Check lipids today.   Followup 6 months.   Marca AnconaDalton McLean 08/11/2017

## 2017-08-11 NOTE — Patient Instructions (Signed)
Your physician has requested that you have an echocardiogram. Echocardiography is a painless test that uses sound waves to create images of your heart. It provides your doctor with information about the size and shape of your heart and how well your heart's chambers and valves are working. This procedure takes approximately one hour. There are no restrictions for this procedure.  (they will call you)     Labs drawn today (if we do not call you, then your lab work was stable)   Your physician recommends that you schedule a follow-up appointment in: 6 months (September, 2019)  with Dr. Shirlee LatchMcLean  Please Call an Schedule Appointment

## 2017-08-12 ENCOUNTER — Telehealth (HOSPITAL_COMMUNITY): Payer: Self-pay | Admitting: Cardiology

## 2017-08-15 ENCOUNTER — Other Ambulatory Visit: Payer: Self-pay | Admitting: Internal Medicine

## 2017-08-17 NOTE — Telephone Encounter (Signed)
User: Pamela [name], Pamela [name] Date/time: 08/12/17 2:20 PM  Comment: Called pt and lmsg for her to CB to get scheduled for echo.  Context:  Outcome: Left Message  Phone number: [phone] Phone Type: Home Phone  Comm. type: Telephone Call type: Outgoing  Contact: Emmaline Lifeavis, Priti E Relation to patient: Self

## 2017-08-19 ENCOUNTER — Telehealth (HOSPITAL_COMMUNITY): Payer: Self-pay | Admitting: *Deleted

## 2017-08-19 MED ORDER — FISH OIL 1000 MG PO CAPS: 2000.0000 mg | ORAL_CAPSULE | Freq: Two times a day (BID) | ORAL | 0 refills | Status: AC

## 2017-08-19 NOTE — Telephone Encounter (Signed)
-----   Message from Laurey Moralealton S McLean, MD sent at 08/14/2017  2:47 PM EST ----- Labs ok except triglycerides high.  Would add fish oil 2 g bid to regimen.

## 2017-08-20 ENCOUNTER — Ambulatory Visit (HOSPITAL_COMMUNITY): Payer: Managed Care, Other (non HMO) | Attending: Cardiovascular Disease

## 2017-08-20 ENCOUNTER — Other Ambulatory Visit: Payer: Self-pay

## 2017-08-20 DIAGNOSIS — I4891 Unspecified atrial fibrillation: Secondary | ICD-10-CM | POA: Insufficient documentation

## 2017-08-20 DIAGNOSIS — I5032 Chronic diastolic (congestive) heart failure: Secondary | ICD-10-CM | POA: Diagnosis not present

## 2017-08-20 DIAGNOSIS — I313 Pericardial effusion (noninflammatory): Secondary | ICD-10-CM | POA: Diagnosis not present

## 2017-08-20 DIAGNOSIS — Z6841 Body Mass Index (BMI) 40.0 and over, adult: Secondary | ICD-10-CM | POA: Insufficient documentation

## 2017-08-20 DIAGNOSIS — E119 Type 2 diabetes mellitus without complications: Secondary | ICD-10-CM | POA: Insufficient documentation

## 2017-08-20 DIAGNOSIS — I110 Hypertensive heart disease with heart failure: Secondary | ICD-10-CM | POA: Diagnosis not present

## 2017-08-28 ENCOUNTER — Telehealth (HOSPITAL_COMMUNITY): Payer: Self-pay

## 2017-08-28 ENCOUNTER — Encounter (HOSPITAL_COMMUNITY): Payer: Self-pay

## 2017-08-28 NOTE — Telephone Encounter (Signed)
Notes recorded by Teresa CoombsGunter, Natan Hartog, RN on 08/28/2017 at 2:47 PM EDT I have been unable to reach this patient by phone. A letter is being sent.  ------  Notes recorded by Teresa CoombsGunter, Ronika Kelson, RN on 08/26/2017 at 3:02 PM EDT Left VM  ------  Notes recorded by Teresa CoombsGunter, Amritpal Shropshire, RN on 08/21/2017 at 9:38 AM EDT Left VM  ------  Notes recorded by Laurey MoraleMcLean, Dalton S, MD on 08/20/2017 at 10:46 PM EDT EF normal

## 2017-09-27 IMAGING — DX DG ANKLE COMPLETE 3+V*R*
3 series · 3 of 3 positions shown · non-contrast
Comparison: No prior exams available for comparison.

CLINICAL DATA: Right ankle pain.  No recent injury.

EXAM:
RIGHT ANKLE - COMPLETE 3+ VIEW

[ankle ap]
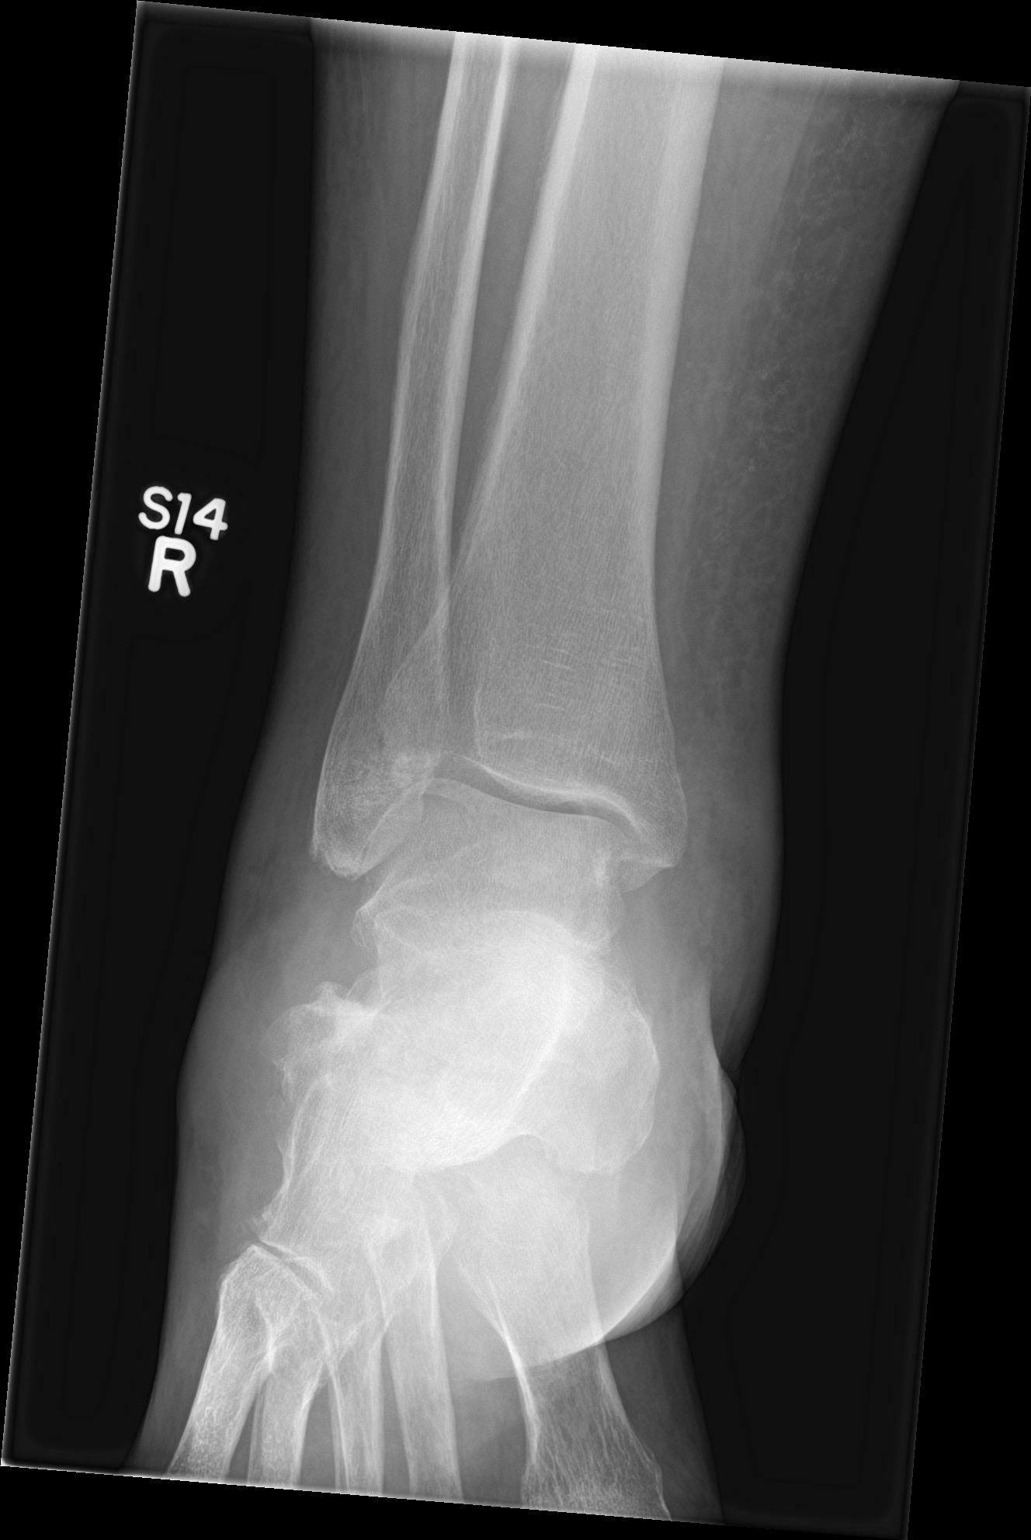

[ankle obl]
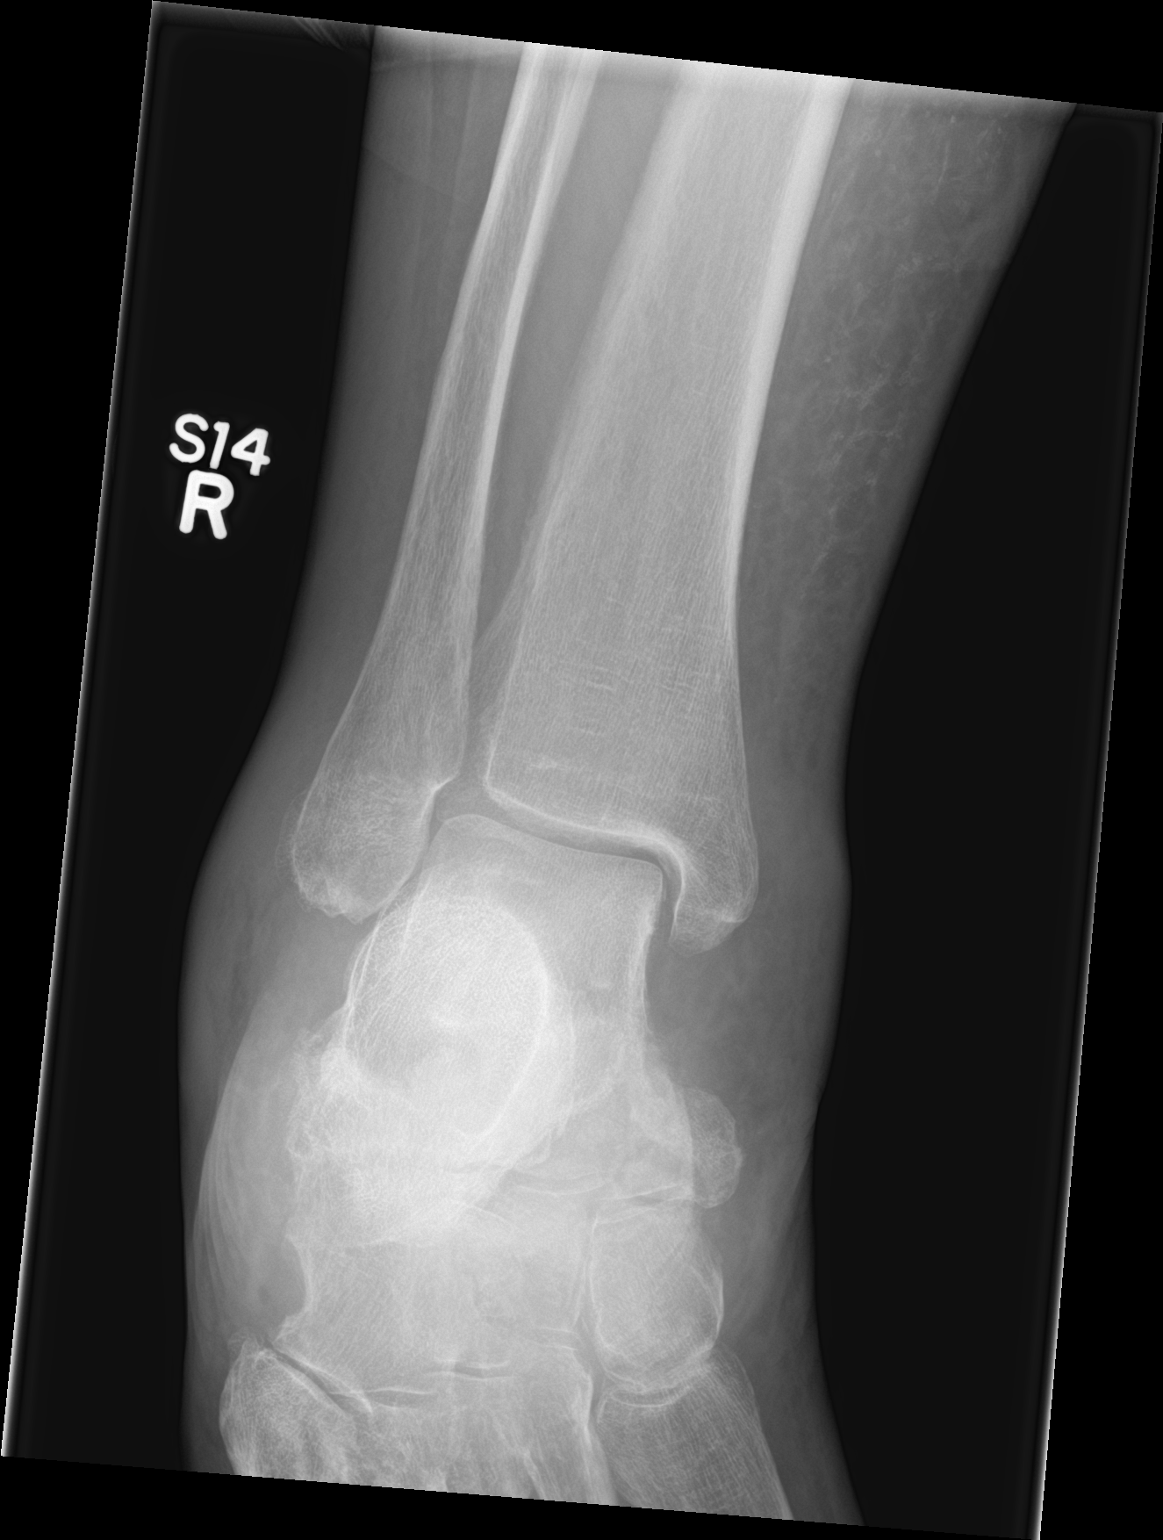

[ankle lat]
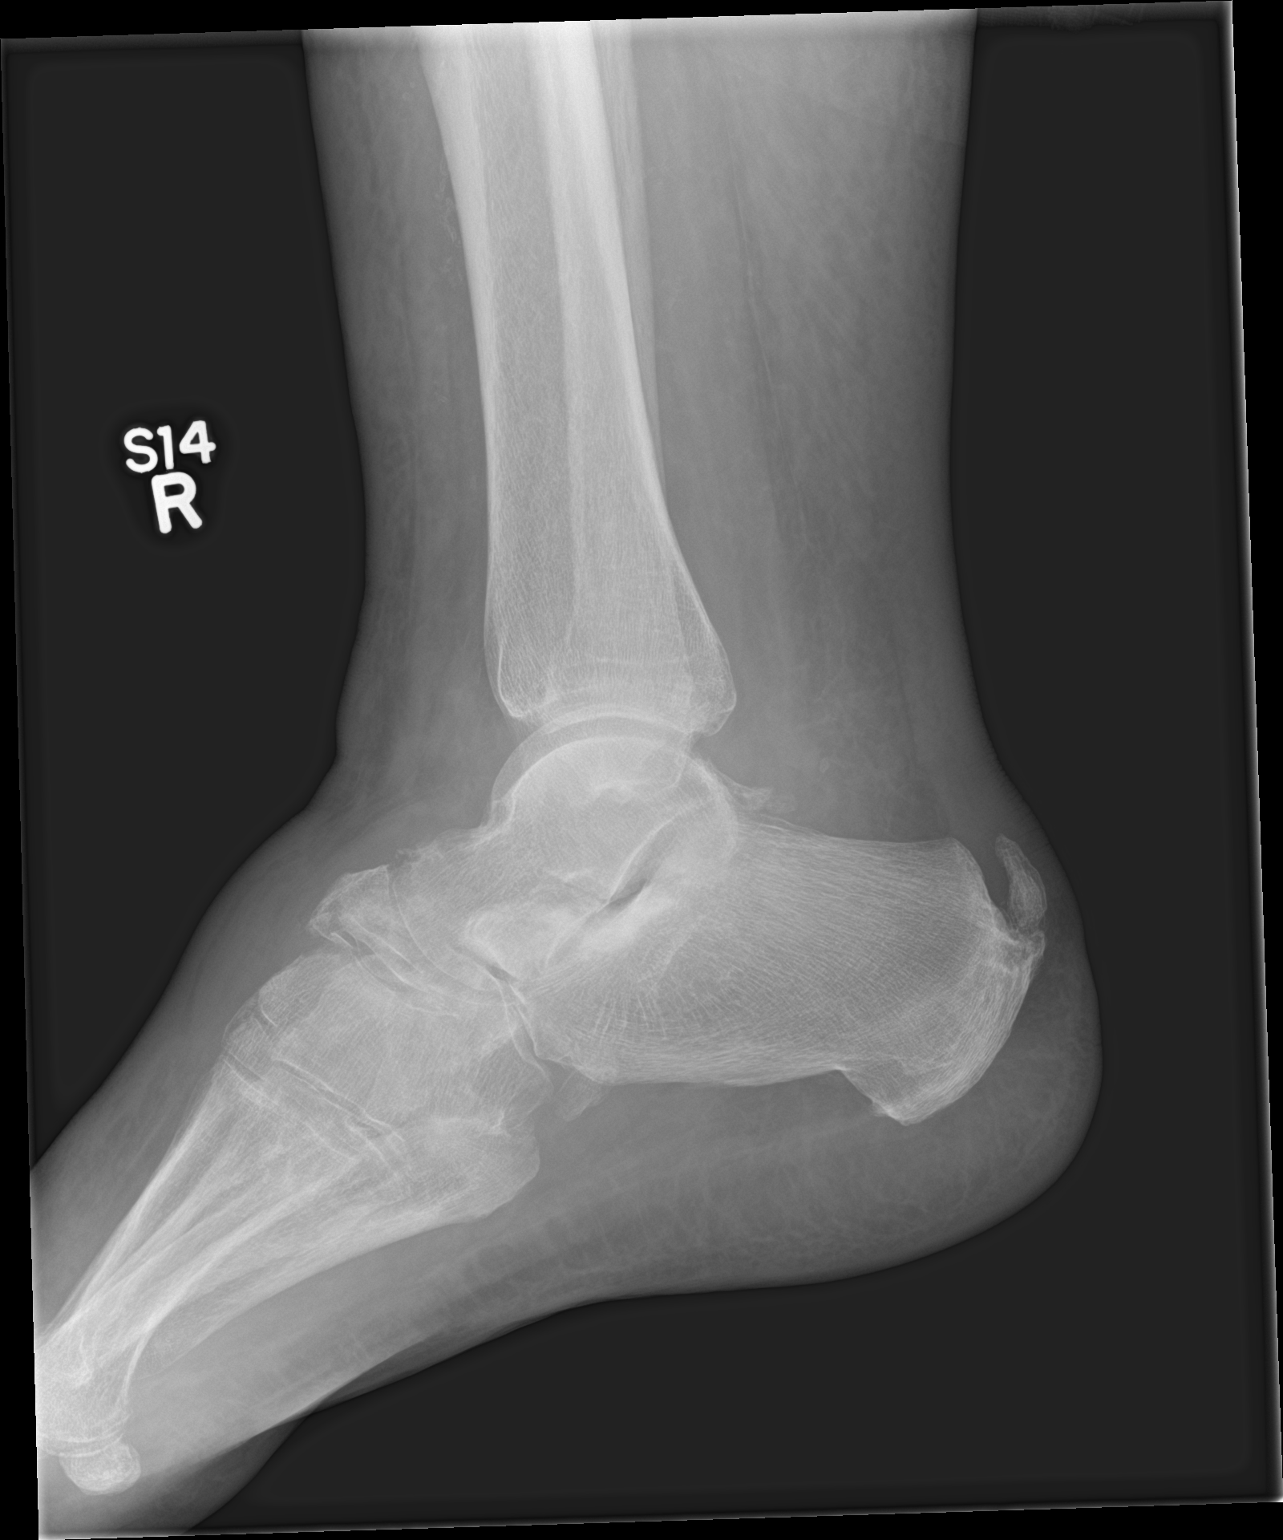

[3 of 3 positions shown; findings below may reference images not displayed]

FINDINGS: Diffuse soft tissue swelling. Diffuse degenerative change. Prominent
deformity noted of the navicular. This could be related to a
fracture. This could be related avascular necrosis. Right foot
series can be obtained for further evaluation.
IMPRESSION: 1. Diffuse soft-tissue swelling.
2. Diffuse degenerative change about the ankle and foot. Severe
deformity is noted of the navicula. This may be related to fracture
or a process such as avascular necrosis. Right foot series can be
obtained to further evaluate.

## 2017-10-12 LAB — HM DIABETES EYE EXAM

## 2017-10-16 ENCOUNTER — Encounter: Payer: Self-pay | Admitting: Internal Medicine

## 2017-10-29 ENCOUNTER — Ambulatory Visit (INDEPENDENT_AMBULATORY_CARE_PROVIDER_SITE_OTHER)
Admission: RE | Admit: 2017-10-29 | Discharge: 2017-10-29 | Disposition: A | Discharge status: Home / Self Care | Payer: Managed Care, Other (non HMO) | Source: Ambulatory Visit | Attending: Family Medicine | Admitting: Family Medicine

## 2017-10-29 ENCOUNTER — Encounter: Payer: Self-pay | Admitting: Family Medicine

## 2017-10-29 ENCOUNTER — Ambulatory Visit: Payer: Managed Care, Other (non HMO) | Admitting: Family Medicine

## 2017-10-29 VITALS — BP 128/66 | HR 68 | Temp 98.3°F | Ht 72.0 in | Wt 338.0 lb

## 2017-10-29 DIAGNOSIS — E11621 Type 2 diabetes mellitus with foot ulcer: Secondary | ICD-10-CM | POA: Diagnosis not present

## 2017-10-29 DIAGNOSIS — L97419 Non-pressure chronic ulcer of right heel and midfoot with unspecified severity: Secondary | ICD-10-CM

## 2017-10-29 DIAGNOSIS — E11610 Type 2 diabetes mellitus with diabetic neuropathic arthropathy: Secondary | ICD-10-CM | POA: Diagnosis not present

## 2017-10-29 MED ORDER — TRAMADOL HCL 50 MG PO TABS: 50.0000 mg | ORAL_TABLET | Freq: Three times a day (TID) | ORAL | 0 refills | Status: DC | PRN

## 2017-10-29 NOTE — Patient Instructions (Signed)
We will call you about qualifying for the prolia  I have referred you to podiatry  Please follow up with Dr. [name] earlier for your diabetes  Please follow up in 2-3 weeks to check on your foot if you're not able to get into podiatry

## 2017-10-29 NOTE — Progress Notes (Signed)
Pamela [name] - 62 y.o. female MRN [id number]  Date of birth: July 14, 1955  SUBJECTIVE:  Including CC & ROS.  Chief Complaint  Patient presents with  . Right foot pain    Pamela [name] is a 62 y.o. female that is presenting with right foot pain. She was seen in 02/2017 for the same symptom. She states the pain has been increasing over the past month, she previously had a stress fracture in 2017. Standing and walking exacerbates her symptoms. She is taking tylenol and tramadol for the pain.  Pain is throbbing and severe. Located on the the lateral dorsal aspect of her right midfoot. She was unable to work yesterday due to the pain. She has been wearing the CAM walker daily not continuously. Has sensation changes on the bottom of her foot. Does not tolerate gabapentin.   I have independently reviewed the MRI of her right foot from 05/12/15 showing a navicular fracture and neuropathic foot. X-ray of her right ankle from 04/19/15 shows degenerative changes about the ankle foot. Severe deformity noted with the navicular. 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  Review of Systems  Constitutional: Negative for fever.  HENT: Negative for congestion.   Respiratory: Negative for cough.   Cardiovascular: Negative for chest pain.  Gastrointestinal: Negative for abdominal pain.  Musculoskeletal: Positive for arthralgias and joint swelling.  Skin: Positive for wound.  Neurological: Positive for numbness.  Hematological: Negative for adenopathy.  Psychiatric/Behavioral: Negative for agitation.    HISTORY: Past Medical, Surgical, Social, and Family History Reviewed & Updated per EMR.   Pertinent Historical Findings include:  Past Medical History:  Diagnosis Date  . ALLERGIC RHINITIS 12/31/2007  . ANXIETY 12/31/2007  . Atrial fibrillation (HCC)   . DEPRESSION 12/31/2007  . DIABETES MELLITUS, TYPE II 12/31/2007  . Diastolic dysfunction 10/11/2010  . Hyperlipidemia 04/23/2012  .  HYPERTENSION 12/31/2007  . Migraine   . PNA (pneumonia)    in her 37s  . Right knee DJD   . Rosacea 10/11/2010    Past Surgical History:  Procedure Laterality Date  . 2 D&C     1980s    Allergies  Allergen Reactions  . Cymbalta [Duloxetine Hcl]     "loopiness"  . Gabapentin Itching    Family History  Problem Relation Age of Onset  . Arthritis Other   . Cancer Other        breast  . Heart disease Other   . Stroke Other   . Mental illness Other        emotional  . Diabetes Other      Social History   Socioeconomic History  . Marital status: Single    Spouse name: Not on file  . Number of children: 0  . Years of education: Not on file  . Highest education level: Not on file  Occupational History  . 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 Financial resource strain: Not on file  . Food insecurity:    Worry: Not on file    Inability: Not on file  . Transportation needs:    Medical: Not on file    Non-medical: Not on file  Tobacco Use  . Smoking status: Never Smoker  . Smokeless tobacco: Never Used  Substance and Sexual Activity  . Alcohol use: Yes    Comment: social  . Drug use: No  . Sexual activity: Not on file  Lifestyle  .  Physical activity:    Days per week: Not on file    Minutes per session: Not on file  . Stress: Not on file  Relationships  . Social connections:    Talks on phone: Not on file    Gets together: Not on file    Attends religious service: Not on file    Active member of club or organization: Not on file    Attends meetings of clubs or organizations: Not on file    Relationship status: Not on file  . Intimate partner violence:    Fear of current or ex partner: Not on file    Emotionally abused: Not on file    Physically abused: Not on file    Forced sexual activity: Not on file  Other Topics Concern  . Not on file  Social History Narrative  . Not on file     PHYSICAL EXAM:  VS: BP  128/66 (BP Location: Left Arm, Patient Position: Sitting, Cuff Size: Large)   Pulse 68   Temp 98.3 F (36.8 C) (Oral)   Ht 6' (1.829 m)   Wt (!) 338 lb (153.3 kg)   LMP 09/08/2010 (Exact Date)   SpO2 98%   BMI 45.84 kg/m  Physical Exam Gen: NAD, alert, cooperative with exam,  ENT: normal lips, normal nasal mucosa,  Eye: normal EOM, normal conjunctiva and lids CV:  no edema, +2 pedal pulses   Resp: no accessory muscle use, non-labored,  Skin: no rashes, no areas of induration  Neuro: normal tone, normal sensation to touch Psych:  normal insight, alert and oriented MSK:  Right foot:  Midfoot shift in a varus rotation  TTP at the base of the 5th MT  Callus formation at base of 5th MT roughly 1 cm in diameter. With central circular area of wound to suggest ongoing skin breakdown  Pulses intact  Normal ankle ROM  Limping with gait  Neurovascularly intact  Debridement Procedure Note:  Right lateral midfoot with callus changes without involvement of the epidermis. The area was roughly 1 cm in diameter with no drainage. Sharp excision with an 11 blade scalpel. Debrided devitalized epidermis. Patient tolerated procedure.      ASSESSMENT & PLAN:   Charcot foot due to diabetes mellitus (HCC) Her chronic ongoing pain is related to the changes observed in her foot from previous imaging.  - consider lyrica if doesn't tolerate gabapentin  - would encourage diabetic shoes. History of ulcer on left and now looks to be a new one on right.  - tramadol  - xray   Diabetic foot ulcer (HCC) Appears this is the cause of her pain today. The beginning of an ulcer is becoming to form at the base of the 5th MT. She is using this area as an axis point of rotation during gait cycle due to the changes in his midfoot.  - debrided this area today  - referral to podiatry.

## 2017-10-30 NOTE — Assessment & Plan Note (Addendum)
Her chronic ongoing pain is related to the changes observed in her foot from previous imaging.  - consider lyrica if doesn't tolerate gabapentin  - would encourage diabetic shoes. History of ulcer on left and now looks to be a new one on right.  - tramadol  - xray

## 2017-10-30 NOTE — Assessment & Plan Note (Signed)
Appears this is the cause of her pain today. The beginning of an ulcer is becoming to form at the base of the 5th MT. She is using this area as an axis point of rotation during gait cycle due to the changes in his midfoot.  - debrided this area today  - referral to podiatry.

## 2017-11-12 ENCOUNTER — Ambulatory Visit: Payer: Managed Care, Other (non HMO)

## 2017-11-12 ENCOUNTER — Ambulatory Visit: Payer: Managed Care, Other (non HMO) | Admitting: Podiatry

## 2017-11-12 ENCOUNTER — Telehealth: Payer: Self-pay | Admitting: *Deleted

## 2017-11-12 DIAGNOSIS — L97511 Non-pressure chronic ulcer of other part of right foot limited to breakdown of skin: Secondary | ICD-10-CM

## 2017-11-12 DIAGNOSIS — M2032 Hallux varus (acquired), left foot: Secondary | ICD-10-CM

## 2017-11-12 DIAGNOSIS — M779 Enthesopathy, unspecified: Secondary | ICD-10-CM | POA: Diagnosis not present

## 2017-11-12 DIAGNOSIS — I739 Peripheral vascular disease, unspecified: Secondary | ICD-10-CM | POA: Diagnosis not present

## 2017-11-12 DIAGNOSIS — R0989 Other specified symptoms and signs involving the circulatory and respiratory systems: Secondary | ICD-10-CM

## 2017-11-12 MED ORDER — MUPIROCIN 2 % EX OINT: 1.0000 "application " | TOPICAL_OINTMENT | Freq: Two times a day (BID) | CUTANEOUS | 2 refills | Status: DC

## 2017-11-12 MED ORDER — DOXYCYCLINE HYCLATE 100 MG PO TABS: 100.0000 mg | ORAL_TABLET | Freq: Two times a day (BID) | ORAL | 0 refills | Status: DC

## 2017-11-12 NOTE — Progress Notes (Signed)
Dg foot  

## 2017-11-12 NOTE — Telephone Encounter (Signed)
Orders faxed to [facility]. 

## 2017-11-12 NOTE — Telephone Encounter (Signed)
-----   Message from Vivi BarrackMatthew R Wagoner, DPM sent at 11/12/2017  1:19 PM EDT ----- Can you please order an ABI? Dx decreased pulses

## 2017-11-14 ENCOUNTER — Other Ambulatory Visit: Payer: Self-pay | Admitting: Internal Medicine

## 2017-11-15 NOTE — Progress Notes (Signed)
Subjective:   Patient ID: Pamela [name], female   DOB: 62 y.o.   MRN: [id number]   HPI 62 year old female presents the office today for concerns of pain to the right foot as well as a wound to the upper aspect the right foot.  She states that she started having a stress fracture about 2 years ago on the right foot and she states that never healed correctly.  She has a cam boot that she wears at home.  She also has a wound on the left big toe which is been ongoing for some time and is been unchanged.  She denies any drainage or pus coming from the wounds.  Denies any increase in swelling to her feet denies any redness or red streaks.  She does not check her blood sugar.  Her last A1c that I could see was 10.3 in September 2018.  She has no other concerns today.   Review of Systems  All other systems reviewed and are negative.  Past Medical History:  Diagnosis Date  . ALLERGIC RHINITIS 12/31/2007  . ANXIETY 12/31/2007  . Atrial fibrillation (HCC)   . DEPRESSION 12/31/2007  . DIABETES MELLITUS, TYPE II 12/31/2007  . Diastolic dysfunction 10/11/2010  . Hyperlipidemia 04/23/2012  . HYPERTENSION 12/31/2007  . Migraine   . PNA (pneumonia)    in her 49s  . Right knee DJD   . Rosacea 10/11/2010    Past Surgical History:  Procedure Laterality Date  . 2 D&C     1980s     Current Outpatient Medications:  .  albuterol (PROVENTIL HFA;VENTOLIN HFA) 108 (90 Base) MCG/ACT inhaler, Inhale 2 puffs into the lungs every 6 (six) hours as needed for wheezing or shortness of breath., Disp: 1 Inhaler, Rfl: 0 .  atorvastatin (LIPITOR) 10 MG tablet, TAKE 1 TABLET BY MOUTH EVERY DAY, Disp: 90 tablet, Rfl: 3 .  cetirizine (ZYRTEC) 10 MG tablet, TAKE 1 TABLET(10 MG) BY MOUTH DAILY, Disp: 30 tablet, Rfl: 2 .  diltiazem (CARDIZEM CD) 360 MG 24 hr capsule, TAKE 1 CAPSULE(360 MG) BY MOUTH DAILY, Disp: 30 capsule, Rfl: 0 .  doxycycline (VIBRA-TABS) 100 MG tablet, Take 1 tablet (100 mg total) by mouth 2 (two) times  daily., Disp: 20 tablet, Rfl: 0 .  furosemide (LASIX) 20 MG tablet, TAKE 2 TABLETS BY MOUTH EVERY MORNING, AND TAKE 1 TABLET EVERY EVENING, Disp: 270 tablet, Rfl: 3 .  glipiZIDE (GLUCOTROL XL) 10 MG 24 hr tablet, TAKE 1 TABLET(10 MG) BY MOUTH DAILY WITH BREAKFAST, Disp: 90 tablet, Rfl: 3 .  losartan (COZAAR) 25 MG tablet, Take 25 mg by mouth daily., Disp: , Rfl:  .  metFORMIN (GLUCOPHAGE) 1000 MG tablet, TAKE 1 TABLET(1000 MG) BY MOUTH TWICE DAILY WITH A MEAL, Disp: 180 tablet, Rfl: 2 .  metoprolol succinate (TOPROL-XL) 100 MG 24 hr tablet, Take 1 tablet (100 mg total) 2 (two) times daily by mouth., Disp: 180 tablet, Rfl: 3 .  mometasone (ELOCON) 0.1 % cream, Apply 1 application topically 2 (two) times daily., Disp: 45 g, Rfl: 0 .  mupirocin ointment (BACTROBAN) 2 %, Apply 1 application topically 2 (two) times daily., Disp: 30 g, Rfl: 2 .  Omega-3 Fatty Acids (FISH OIL) 1000 MG CAPS, Take 2 capsules (2,000 mg total) by mouth 2 (two) times daily., Disp: 60 capsule, Rfl: 0 .  potassium chloride SA (K-DUR,KLOR-CON) 20 MEQ tablet, Take 1 tablet (20 mEq total) by mouth daily., Disp: 90 tablet, Rfl: 4 .  rivaroxaban (XARELTO) 20 MG  TABS tablet, TAKE 1 TABLET BY MOUTH EVERY DAY WITH SUPPER, Disp: 30 tablet, Rfl: 11 .  spironolactone (ALDACTONE) 25 MG tablet, Take 1 tablet (25 mg total) by mouth daily., Disp: 90 tablet, Rfl: 3 .  traMADol (ULTRAM) 50 MG tablet, Take 1 tablet (50 mg total) by mouth every 8 (eight) hours as needed., Disp: 30 tablet, Rfl: 0 .  traMADol (ULTRAM) 50 MG tablet, Take 1 tablet (50 mg total) by mouth every 8 (eight) hours as needed., Disp: 15 tablet, Rfl: 0 .  Vitamin D, Ergocalciferol, (DRISDOL) 50000 units CAPS capsule, TAKE 1 CAPSULE BY MOUTH EVERY 7 DAYS (Patient not taking: Reported on 10/29/2017), Disp: 12 capsule, Rfl: 0  Allergies  Allergen Reactions  . Cymbalta [Duloxetine Hcl]     "loopiness"  . Gabapentin Itching         Objective:  Physical Exam  General: AAO  x3, NAD  Dermatological: On the dorsal aspect the left hallux IPJ is a superficial granular wound which measures approximate 0.3 x 0.3 cm and superficial with any probing, undermining or tunneling.  No surrounding erythema, ascending cellulitis.  There is no fluctuation or crepitation or any malodor.  On the right foot fifth metatarsal base there are 2 separate ulcerations adjacent to each other each measuring about 0.8 x 0.3 cm.  There is a granular wound base and a depth of 0.1 cm.  Minimal surrounding erythema but there is no increase in warmth and there is no ascending cellulitis.  There is no fluctuation or crepitation.  There is no other open lesions identified today.  Vascular: DP, PT pulses 1/4, CRT less than 2 seconds.  Neruologic: Sensation decreased with Dorann OuSimms Weinstein monofilament  Musculoskeletal: Mild diffuse tenderness on the dorsal midfoot on the right side.  Minimal swelling and there is no erythema or increase in warmth.  There is a wider foot type on the right side compared to contralateral extremity consistent with Charcot.  Hallux malleus is present on the left side.  Muscular strength 5/5 in all groups tested bilateral.  Gait: Unassisted, Nonantalgic.       Assessment:   62 year old female with bilateral ulcerations, Charcot    Plan:  -Treatment options discussed including all alternatives, risks, and complications -Etiology of symptoms were discussed -Reviewed the x-rays as well as the chart -We will order arterial duplex to evaluate circulation. -Mupirocin ointment was prescribed to apply topically onto the wounds daily.  I did clean the wounds today.  Also prescribed doxycycline. Given the mild erythema on the right side.  She has a cam boot at home but I want her to wear.. with a long discussion regards to the Charcot process and ulcerations and the risk of limb loss also discussed the importance of checking her blood sugar on a regular basis.  Offloading of the left  hallux daily.  Vivi BarrackMatthew R Wagoner DPM

## 2017-11-17 ENCOUNTER — Ambulatory Visit (HOSPITAL_COMMUNITY)
Admission: RE | Admit: 2017-11-17 | Payer: Managed Care, Other (non HMO) | Source: Ambulatory Visit | Attending: Podiatry | Admitting: Podiatry

## 2017-11-26 ENCOUNTER — Ambulatory Visit: Payer: Managed Care, Other (non HMO) | Admitting: Podiatry

## 2017-11-26 DIAGNOSIS — M2032 Hallux varus (acquired), left foot: Secondary | ICD-10-CM

## 2017-11-26 DIAGNOSIS — L97511 Non-pressure chronic ulcer of other part of right foot limited to breakdown of skin: Secondary | ICD-10-CM

## 2017-11-29 NOTE — Progress Notes (Signed)
Subjective: 62 year old female presents the office today for follow-up evaluation of wounds to both of her feet.  She is also been wearing the cam boot on the right side which is been helpful.  She does change the bandage daily with antibiotic ointment.  She finished the antibiotics and did not have any problems with this.  She states that she did not get her circulation test because she was having some back issues and she has to reschedule.  She denies any drainage or pus coming from the wound but she states it is difficult to see the wound on the right foot. Denies any systemic complaints such as fevers, chills, nausea, vomiting. No acute changes since last appointment, and no other complaints at this time.   Objective: AAO x3, NAD DP/PT pulses 1/4bilaterally, CRT less than 3 seconds Chronic Charcot changes present.  There is prominence along lateral aspect of the foot on the right side.  There is 2 separate ulcerations which continue however they appear to be smaller.  They measure about 0.5 x 0.3 cm.  There is no surrounding erythema, ascending cellulitis.  There is no fluctuation or crepitation.  The wound base is granular.  No probing, undermining or tunneling.  On the left dorsal IPJ of the hallux is a hyperkeratotic lesion upon debridement appears about wound is healed.  Again there is no edema erythema or any signs of infection identified. No open lesions or pre-ulcerative lesions.  No pain with calf compression, swelling, warmth, erythema  Assessment: Ulcerations bilaterally  Plan: -All treatment options discussed with the patient including all alternatives, risks, complications.  -We are going to continue with the mupirocin ointment dressing changes daily.  I did debride the wound today to remove some nonviable hyperkeratotic tissue on the periphery of the wounds.  On the left foot I also debrided the hyperkeratotic tissue to reveal the underlying wound is healed.  Continue offloading the  left hallux at all times and continue cam boot on the right side.  We will hold off any further oral antibiotics at this time as there is no signs of infection. -She is been rescheduled circulation test. -Monitor for any clinical signs or symptoms of infection and directed to call the office immediately should any occur or go to the ER. -Patient encouraged to call the office with any questions, concerns, change in symptoms.   Return in about 2 weeks (around 12/10/2017).  Vivi BarrackMatthew R Edword Cu DPM

## 2017-12-01 ENCOUNTER — Encounter: Payer: Self-pay | Admitting: Internal Medicine

## 2017-12-01 ENCOUNTER — Ambulatory Visit (INDEPENDENT_AMBULATORY_CARE_PROVIDER_SITE_OTHER)
Admission: RE | Admit: 2017-12-01 | Discharge: 2017-12-01 | Disposition: A | Discharge status: Home / Self Care | Payer: Managed Care, Other (non HMO) | Source: Ambulatory Visit | Attending: Internal Medicine | Admitting: Internal Medicine

## 2017-12-01 ENCOUNTER — Ambulatory Visit: Payer: Managed Care, Other (non HMO) | Admitting: Internal Medicine

## 2017-12-01 VITALS — BP 136/84 | HR 90 | Temp 98.5°F | Ht 72.0 in | Wt 346.0 lb

## 2017-12-01 DIAGNOSIS — R05 Cough: Secondary | ICD-10-CM

## 2017-12-01 DIAGNOSIS — I10 Essential (primary) hypertension: Secondary | ICD-10-CM | POA: Diagnosis not present

## 2017-12-01 DIAGNOSIS — M545 Low back pain, unspecified: Secondary | ICD-10-CM

## 2017-12-01 DIAGNOSIS — E11610 Type 2 diabetes mellitus with diabetic neuropathic arthropathy: Secondary | ICD-10-CM

## 2017-12-01 DIAGNOSIS — R059 Cough, unspecified: Secondary | ICD-10-CM

## 2017-12-01 MED ORDER — CYCLOBENZAPRINE HCL 5 MG PO TABS: 5.0000 mg | ORAL_TABLET | Freq: Three times a day (TID) | ORAL | 1 refills | Status: DC | PRN

## 2017-12-01 MED ORDER — TRAMADOL HCL 50 MG PO TABS
50.0000 mg | ORAL_TABLET | Freq: Four times a day (QID) | ORAL | 0 refills | Status: DC | PRN
Start: 2017-12-01 — End: 2020-11-24

## 2017-12-01 NOTE — Assessment & Plan Note (Signed)
C/w msk strain somewhat improving, for tramadol prn, flexeril prn,  to f/u any worsening symptoms or concerns

## 2017-12-01 NOTE — Assessment & Plan Note (Signed)
stable overall by history and exam, recent data reviewed with pt, and pt to continue medical treatment as before,  to f/u any worsening symptoms or concerns BP Readings from Last 3 Encounters:  12/01/17 136/84  10/29/17 128/66  08/11/17 (!) 147/87

## 2017-12-01 NOTE — Assessment & Plan Note (Signed)
Etiology unclear, for cxr,  to f/u any worsening symptoms or concerns 

## 2017-12-01 NOTE — Patient Instructions (Signed)
/  Please take all new medication as prescribed - the pain medication, and muscle relaxer If needed  Please continue all other medications as before, and refills have been done if requested.  Please have the pharmacy call with any other refills you may need..  Please keep your appointments with your specialists as you may have planned  Please go to the XRAY Department in the Basement (go straight as you get off the elevator) for the x-ray testing  You will be contacted by phone if any changes need to be made immediately.  Otherwise, you will receive a letter about your results with an explanation, but please check with MyChart first.  Please remember to sign up for MyChart if you have not done so, as this will be important to you in the future with finding out test results, communicating by private email, and scheduling acute appointments online when needed.

## 2017-12-01 NOTE — Progress Notes (Signed)
Subjective:    Patient ID: Pamela [name], female    DOB: Feb 04, 1956, 62 y.o.   MRN: [id number]  HPI  Here with c/o back pain x 2 wks primarily right lower back to the right subscapular area with a firmness and tender to palpate near the scapula, not better with tylenol, No nsaids on anticoagulation. Was out of work 3 days last wk, back this wk as pain improved but still quite moderate.  Pt denies chest pain, increased sob or doe, wheezing, orthopnea, PND, increased LE swelling, palpitations, dizziness or syncope, but also has a persistent dry cough for several months.  Tramadol helps pain, not sure about cough.  Pillow to the lower back helps with sitting at work.  Nothing else makes better or worse. Denies urinary symptoms such as dysuria, frequency, urgency, flank pain, hematuria or n/v, fever, chills. Denies worsening reflux, abd pain, dysphagia, n/v, bowel change or blood. Past Medical History:  Diagnosis Date  . ALLERGIC RHINITIS 12/31/2007  . ANXIETY 12/31/2007  . Atrial fibrillation (HCC)   . DEPRESSION 12/31/2007  . DIABETES MELLITUS, TYPE II 12/31/2007  . Diastolic dysfunction 10/11/2010  . Hyperlipidemia 04/23/2012  . HYPERTENSION 12/31/2007  . Migraine   . PNA (pneumonia)    in her 7220s  . Right knee DJD   . Rosacea 10/11/2010   Past Surgical History:  Procedure Laterality Date  . 2 D&C     1980s    reports that she has never smoked. She has never used smokeless tobacco. She reports that she drinks alcohol. She reports that she does not use drugs. family history includes Arthritis in her other; Cancer in her other; Diabetes in her other; Heart disease in her other; Mental illness in her other; Stroke in her other. Allergies  Allergen Reactions  . Cymbalta [Duloxetine Hcl]     "loopiness"  . Gabapentin Itching   Current Outpatient Medications on File Prior to Visit  Medication Sig Dispense Refill  . albuterol (PROVENTIL HFA;VENTOLIN HFA) 108 (90 Base) MCG/ACT inhaler Inhale 2  puffs into the lungs every 6 (six) hours as needed for wheezing or shortness of breath. 1 Inhaler 0  . atorvastatin (LIPITOR) 10 MG tablet TAKE 1 TABLET BY MOUTH EVERY DAY 90 tablet 3  . cetirizine (ZYRTEC) 10 MG tablet TAKE 1 TABLET(10 MG) BY MOUTH DAILY 30 tablet 2  . diltiazem (CARDIZEM CD) 360 MG 24 hr capsule TAKE 1 CAPSULE(360 MG) BY MOUTH DAILY 30 capsule 0  . doxycycline (VIBRA-TABS) 100 MG tablet Take 1 tablet (100 mg total) by mouth 2 (two) times daily. 20 tablet 0  . furosemide (LASIX) 20 MG tablet TAKE 2 TABLETS BY MOUTH EVERY MORNING, AND TAKE 1 TABLET EVERY EVENING 270 tablet 3  . glipiZIDE (GLUCOTROL XL) 10 MG 24 hr tablet TAKE 1 TABLET(10 MG) BY MOUTH DAILY WITH BREAKFAST 90 tablet 3  . losartan (COZAAR) 25 MG tablet Take 25 mg by mouth daily.    . metFORMIN (GLUCOPHAGE) 1000 MG tablet TAKE 1 TABLET(1000 MG) BY MOUTH TWICE DAILY WITH A MEAL 180 tablet 2  . metoprolol succinate (TOPROL-XL) 100 MG 24 hr tablet Take 1 tablet (100 mg total) 2 (two) times daily by mouth. 180 tablet 3  . mometasone (ELOCON) 0.1 % cream Apply 1 application topically 2 (two) times daily. 45 g 0  . mupirocin ointment (BACTROBAN) 2 % Apply 1 application topically 2 (two) times daily. 30 g 2  . Omega-3 Fatty Acids (FISH OIL) 1000 MG CAPS Take 2  capsules (2,000 mg total) by mouth 2 (two) times daily. 60 capsule 0  . potassium chloride SA (K-DUR,KLOR-CON) 20 MEQ tablet Take 1 tablet (20 mEq total) by mouth daily. 90 tablet 4  . rivaroxaban (XARELTO) 20 MG TABS tablet TAKE 1 TABLET BY MOUTH EVERY DAY WITH SUPPER 30 tablet 11  . spironolactone (ALDACTONE) 25 MG tablet Take 1 tablet (25 mg total) by mouth daily. 90 tablet 3  . Vitamin D, Ergocalciferol, (DRISDOL) 50000 units CAPS capsule TAKE 1 CAPSULE BY MOUTH EVERY 7 DAYS 12 capsule 0   No current facility-administered medications on file prior to visit.    Review of Systems  Constitutional: Negative for other unusual diaphoresis or sweats HENT: Negative for  ear discharge or swelling Eyes: Negative for other worsening visual disturbances Respiratory: Negative for stridor or other swelling  Gastrointestinal: Negative for worsening distension or other blood Genitourinary: Negative for retention or other urinary change Musculoskeletal: Negative for other MSK pain or swelling Skin: Negative for color change or other new lesions Neurological: Negative for worsening tremors and other numbness  Psychiatric/Behavioral: Negative for worsening agitation or other fatigue] All other system neg per pt    Objective:   Physical Exam BP 136/84   Pulse 90   Temp 98.5 F (36.9 C) (Oral)   Ht 6' (1.829 m)   Wt (!) 346 lb (156.9 kg)   LMP 09/08/2010 (Exact Date)   SpO2 96%   BMI 46.93 kg/m  VS noted,  Constitutional: Pt appears in NAD HENT: Head: NCAT.  Right Ear: External ear normal.  Left Ear: External ear normal.  Eyes: . Pupils are equal, round, and reactive to light. Conjunctivae and EOM are normal Nose: without d/c or deformity Neck: Neck supple. Gross normal ROM Cardiovascular: Normal rate and regular rhythm.   Pulmonary/Chest: Effort normal and breath sounds without rales or wheezing.  Abd:  Soft, NT, ND, + BS, no organomegaly\ Spine nontender, + right lumbar paravertebral tender Neurological: Pt is alert. At baseline orientation, motor grossly intact Skin: Skin is warm. No rashes, other new lesions, no LE edema Psychiatric: Pt behavior is normal without agitation  No other exam findings       Assessment & Plan:

## 2017-12-02 ENCOUNTER — Encounter: Payer: Self-pay | Admitting: Internal Medicine

## 2017-12-09 ENCOUNTER — Telehealth: Payer: Self-pay | Admitting: Internal Medicine

## 2017-12-09 NOTE — Telephone Encounter (Signed)
Called pt, LVM with xray results

## 2017-12-09 NOTE — Telephone Encounter (Signed)
Copied from CRM [phone]. Topic: Quick Communication - See Telephone Encounter >> Dec 09, 2017 10:25 AM Lorrine KinMcGee, Kash Mothershead B, NT wrote: CRM for notification. See Telephone encounter for: 12/09/17. Patient calling to see about x-ray results. States that she has not heard anything back about what was seen. CB#: [phone] Can leave a message if there is no answer.

## 2017-12-18 ENCOUNTER — Ambulatory Visit: Payer: Managed Care, Other (non HMO) | Admitting: Podiatry

## 2017-12-18 ENCOUNTER — Encounter: Payer: Self-pay | Admitting: Podiatry

## 2017-12-18 VITALS — Temp 98.1°F

## 2017-12-18 DIAGNOSIS — L97511 Non-pressure chronic ulcer of other part of right foot limited to breakdown of skin: Secondary | ICD-10-CM | POA: Diagnosis not present

## 2017-12-18 DIAGNOSIS — L97521 Non-pressure chronic ulcer of other part of left foot limited to breakdown of skin: Secondary | ICD-10-CM

## 2017-12-18 NOTE — Progress Notes (Signed)
Subjective: 62 year old female presents the office today for follow-up evaluation of wounds to both of her feet.  She has been continue with mupirocin ointment dressing changes daily.  She has noticed some bloody drainage coming from the wound at times and she denies any pus.  Denies any increase in swelling or redness.  The right side does occasionally cause some discomfort but not consistently.  She does wear the boot majority the time however she states that she does not work states she lifted at work and she presents today wearing a regular sandal.  She denies any fevers, chills, nausea, vomiting.  No calf pain, chest pain, shortness of breath.  No other concerns today.  Objective: AAO x3, NAD DP/PT pulses 1/4bilaterally, CRT less than 3 seconds Chronic Charcot changes present.  There is prominence along lateral aspect of the foot on the right side.  There is 2 separate ulcerations which continue however they appear to be smaller.  The more proximal wound measures approximate 0.2 x 0.1 cm and the more distal wound is about 0.5 x 0.4 cm.  They are both superficial with a granular wound base.  The more distal wound has a depth of 0.1 cm.  There is no probing, undermining or tunneling.  There is faint skin erythema but this is more from pressure from shoes as opposed to infection as there is no increase in warmth, fluctuation, crepitation or malodor or ascending cellulitis.  The wound to the dorsal aspect left hallux appears to be almost healed and the very superficial fissure type lesion.  There is no drainage or pus on the left side.  Faint erythema but I think this is more from inflammation and irritation from shoes but she has hallux malleus and this is rubbing. No open lesions or pre-ulcerative lesions.  No pain with calf compression, swelling, warmth, erythema  Assessment: Ulcerations bilaterally  Plan: -All treatment options discussed with the patient including all alternatives, risks,  complications.  -Today I sharply debrided the wounds on the right foot without any complications or bleeding to healthy, granular wound base utilizing #312 blade scalpel.  The area was cleaned.  Antibiotic ointment and a bandage was applied.  We will continue the mupirocin ointment dressing changes daily.  I want her to continue with cam boot for offloading.  Also there is a callus on the dorsal left hallux which I debrided today to reveal the underlying wound is almost healed.  Continue antibiotic ointment to this area as well.  Offloading at all times.  I will see her back in 2 weeks or sooner if needed.  Monitoring signs or symptoms of infection.  Vivi BarrackMatthew R Tayvia Faughnan DPM

## 2018-01-01 ENCOUNTER — Encounter: Payer: Self-pay | Admitting: Podiatry

## 2018-01-01 ENCOUNTER — Ambulatory Visit: Payer: Managed Care, Other (non HMO) | Admitting: Podiatry

## 2018-01-01 DIAGNOSIS — L97511 Non-pressure chronic ulcer of other part of right foot limited to breakdown of skin: Secondary | ICD-10-CM

## 2018-01-01 DIAGNOSIS — L97521 Non-pressure chronic ulcer of other part of left foot limited to breakdown of skin: Secondary | ICD-10-CM

## 2018-01-05 NOTE — Progress Notes (Signed)
Subjective: 62 year old female presents the office today for follow-up evaluation of wounds to both of her feet.  She does feel that the wounds are getting better.  She feels that the one on the left big toe is healed.  She has some occasional discomfort to the wound on her outside aspect right foot but this is intermittent.  She denies any drainage or pus but again is difficult for her to see if she denies any increase in swelling or redness to her feet.  She has no other concerns today. She denies any fevers, chills, nausea, vomiting.  No calf pain, chest pain, shortness of breath.  No other concerns today.  Objective: AAO x3, NAD DP/PT pulses 1/4bilaterally, CRT less than 3 seconds Chronic Charcot changes present.  There is prominence along lateral aspect of the foot on the right side.  On the lateral aspect of the metatarsal base the wound appears to be healing well and there is only one ulceration present which measures  0.3 x 0.3 cm and there is no probing, undermining or tunneling.  There is no significant surrounding erythema there is no ascending cellulitis.  There is no fluctuation or crepitation or any malodor.  There is no other open lesion on the right foot. On the left foot there is hallux malleus present however the wound of the hallux is healed.  No ulceration of the left foot identified today. No open lesions or pre-ulcerative lesions.  No pain with calf compression, swelling, warmth, erythema  Assessment: Ulceration right lateral foot with healed ulceration left foot  Plan: -All treatment options discussed with the patient including all alternatives, risks, complications.  -Today I sharply debrided the wounds on the right foot without any complications or bleeding to healthy, granular wound base utilizing #312 blade scalpel.  The area was cleaned.  Antibiotic ointment and a bandage was applied.  We will continue the mupirocin ointment dressing changes daily.  I want her to continue  with cam boot for offloading.  She presents today wearing a regular shoe.  -Continue offloading the left hallux to prevent recurrence. -Monitor for any clinical signs or symptoms of infection and directed to call the office immediately should any occur or go to the ER.  Return in about 2 weeks (around 01/15/2018).  Vivi BarrackMatthew R Wagoner DPM

## 2018-01-21 ENCOUNTER — Encounter: Payer: Self-pay | Admitting: Podiatry

## 2018-01-21 ENCOUNTER — Ambulatory Visit: Payer: Managed Care, Other (non HMO) | Admitting: Podiatry

## 2018-01-21 VITALS — Temp 99.2°F

## 2018-01-21 DIAGNOSIS — L97511 Non-pressure chronic ulcer of other part of right foot limited to breakdown of skin: Secondary | ICD-10-CM | POA: Diagnosis not present

## 2018-01-25 NOTE — Progress Notes (Signed)
Subjective: 62 year old female presents the office today for follow-up evaluation of wounds to both of her feet.  Overall she states that she is doing well.  The left side is still doing very well.  The right side also seems to be doing better although is difficult for her to see it.  She denies any increase in swelling or redness or drainage to her feet.  She is still doing daily dressing changes on the right side.  She is keeping a Band-Aid on the left big toe for protection.  She has no other concerns today.  She denies any fevers, chills, nausea, vomiting.  No calf pain, chest pain, shortness of breath.  No other concerns today.  Objective: AAO x3, NAD DP/PT pulses 1/4bilaterally, CRT less than 3 seconds Chronic Charcot changes present.  There is prominence along lateral aspect of the foot on the right side.  On the lateral aspect of the metatarsal base the wound appears to be healing well and there is only one ulceration present which measures about the same at 0.3 x 0.3 cm however starting to scab over.  There is no significant surrounding erythema there is no ascending cellulitis.  There is no fluctuation or crepitation or any malodor.  No other open lesions are present bilaterally and the pain on the left big toe is healed. No pain with calf compression, swelling, warmth, erythema  Assessment: Ulceration right lateral foot with healed ulceration left foot  Plan: -All treatment options discussed with the patient including all alternatives, risks, complications.  -Today I sharply debrided the wounds on the right foot without any complications or bleeding to healthy, granular wound base utilizing #312 blade scalpel.  The area was cleaned.  Antibiotic ointment and a bandage was applied.  We will continue with daily dressing changes.  I want her to continue with cam boot for offloading. She presented today wearing a sandal and she only wears the boot at work some.  -Continue offloading the left  hallux to prevent recurrence.  Dispensed offloading pads. -Monitor for any clinical signs or symptoms of infection and directed to call the office immediately should any occur or go to the ER.  Vivi BarrackMatthew R Khristin Keleher DPM

## 2018-01-26 ENCOUNTER — Other Ambulatory Visit (HOSPITAL_COMMUNITY): Payer: Self-pay

## 2018-01-26 MED ORDER — SPIRONOLACTONE 25 MG PO TABS: 25.0000 mg | ORAL_TABLET | Freq: Every day | ORAL | 0 refills | Status: DC

## 2018-01-26 MED ORDER — RIVAROXABAN 20 MG PO TABS: ORAL_TABLET | ORAL | 2 refills | Status: DC

## 2018-02-04 ENCOUNTER — Encounter: Payer: Self-pay | Admitting: Podiatry

## 2018-02-04 ENCOUNTER — Ambulatory Visit: Payer: Managed Care, Other (non HMO) | Admitting: Podiatry

## 2018-02-04 VITALS — Temp 97.8°F

## 2018-02-04 DIAGNOSIS — L97511 Non-pressure chronic ulcer of other part of right foot limited to breakdown of skin: Secondary | ICD-10-CM | POA: Diagnosis not present

## 2018-02-05 NOTE — Progress Notes (Signed)
Subjective: 62 year old female presents the office today for follow-up evaluation of wounds to both of her feet.  She says the left foot is doing well.  The right foot is also doing well she denies any drainage or pus.  She has some intermittent discomfort there is been ongoing for her not new but is getting better.  Denies any recent swelling or redness to her feet.  She has been continue with mupirocin ointment dressing changes daily.  She has no other concerns today.  No acute changes. She denies any fevers, chills, nausea, vomiting.  No calf pain, chest pain, shortness of breath.  No other concerns today.  Objective: AAO x3, NAD DP/PT pulses 1/4bilaterally, CRT less than 3 seconds Chronic Charcot changes present.  There is prominence along lateral aspect of the foot on the right side.  On the lateral aspect of the metatarsal base the wound appears to be healing well and there is only one ulceration present which measures about the same at 0.3 x 0.3 cm however starting to scab over and almost healed.  There is no significant surrounding erythema there is no ascending cellulitis.  There is no fluctuation or crepitation or any malodor.  No other open lesions are present bilaterally and the pain on the left big toe is healed. No pain with calf compression, swelling, warmth, erythema  Assessment: Ulceration right lateral foot with healed ulceration left foot  Plan: -All treatment options discussed with the patient including all alternatives, risks, complications.  -Today I sharply debrided the wounds on the right foot without any complications or bleeding to healthy, granular wound base utilizing #312 blade scalpel.  The area was cleaned.  Antibiotic ointment and a bandage was applied.  We will continue with daily dressing changes.  I want her to continue with cam boot for offloading. She presented today wearing a sandal and she only wears the boot at work some.  -Continue offloading the left hallux to  prevent recurrence.  Dispensed offloading pads. -Monitor for any clinical signs or symptoms of infection and directed to call the office immediately should any occur or go to the ER.  Vivi BarrackMatthew R Wagoner DPM

## 2018-02-09 ENCOUNTER — Other Ambulatory Visit: Payer: Self-pay | Admitting: Internal Medicine

## 2018-02-13 ENCOUNTER — Other Ambulatory Visit (HOSPITAL_COMMUNITY): Payer: Self-pay | Admitting: Internal Medicine

## 2018-02-18 ENCOUNTER — Ambulatory Visit: Payer: Managed Care, Other (non HMO) | Admitting: Podiatry

## 2018-02-18 ENCOUNTER — Ambulatory Visit (HOSPITAL_COMMUNITY)
Admission: RE | Admit: 2018-02-18 | Discharge: 2018-02-18 | Disposition: A | Discharge status: Home / Self Care | Payer: Managed Care, Other (non HMO) | Source: Ambulatory Visit | Attending: Cardiology | Admitting: Cardiology

## 2018-02-18 ENCOUNTER — Encounter (HOSPITAL_COMMUNITY): Payer: Self-pay | Admitting: Cardiology

## 2018-02-18 ENCOUNTER — Other Ambulatory Visit: Payer: Self-pay | Admitting: Cardiology

## 2018-02-18 VITALS — BP 152/80 | HR 99 | Wt 347.0 lb

## 2018-02-18 DIAGNOSIS — E785 Hyperlipidemia, unspecified: Secondary | ICD-10-CM | POA: Insufficient documentation

## 2018-02-18 DIAGNOSIS — L97511 Non-pressure chronic ulcer of other part of right foot limited to breakdown of skin: Secondary | ICD-10-CM

## 2018-02-18 DIAGNOSIS — I110 Hypertensive heart disease with heart failure: Secondary | ICD-10-CM | POA: Diagnosis not present

## 2018-02-18 DIAGNOSIS — L97521 Non-pressure chronic ulcer of other part of left foot limited to breakdown of skin: Secondary | ICD-10-CM | POA: Diagnosis not present

## 2018-02-18 DIAGNOSIS — I482 Chronic atrial fibrillation, unspecified: Secondary | ICD-10-CM

## 2018-02-18 DIAGNOSIS — Z7901 Long term (current) use of anticoagulants: Secondary | ICD-10-CM | POA: Insufficient documentation

## 2018-02-18 DIAGNOSIS — E119 Type 2 diabetes mellitus without complications: Secondary | ICD-10-CM | POA: Insufficient documentation

## 2018-02-18 DIAGNOSIS — I739 Peripheral vascular disease, unspecified: Secondary | ICD-10-CM

## 2018-02-18 DIAGNOSIS — I5032 Chronic diastolic (congestive) heart failure: Secondary | ICD-10-CM

## 2018-02-18 DIAGNOSIS — Z7984 Long term (current) use of oral hypoglycemic drugs: Secondary | ICD-10-CM | POA: Diagnosis not present

## 2018-02-18 DIAGNOSIS — E11621 Type 2 diabetes mellitus with foot ulcer: Secondary | ICD-10-CM | POA: Diagnosis not present

## 2018-02-18 DIAGNOSIS — E669 Obesity, unspecified: Secondary | ICD-10-CM | POA: Insufficient documentation

## 2018-02-18 DIAGNOSIS — L719 Rosacea, unspecified: Secondary | ICD-10-CM | POA: Diagnosis not present

## 2018-02-18 DIAGNOSIS — Z79899 Other long term (current) drug therapy: Secondary | ICD-10-CM | POA: Insufficient documentation

## 2018-02-18 LAB — CBC
HCT: 38.5 % (ref 36.0–46.0)
Hemoglobin: 12.2 g/dL (ref 12.0–15.0)
MCH: 26.9 pg (ref 26.0–34.0)
MCHC: 31.7 g/dL (ref 30.0–36.0)
MCV: 84.8 fL (ref 78.0–100.0)
PLATELETS: 254 10*3/uL (ref 150–400)
RBC: 4.54 MIL/uL (ref 3.87–5.11)
RDW: 14.6 % (ref 11.5–15.5)
WBC: 9.9 10*3/uL (ref 4.0–10.5)

## 2018-02-18 LAB — BASIC METABOLIC PANEL
ANION GAP: 13 (ref 5–15)
BUN: 21 mg/dL (ref 8–23)
CO2: 24 mmol/L (ref 22–32)
CREATININE: 1.48 mg/dL — AB (ref 0.44–1.00)
Calcium: 9.6 mg/dL (ref 8.9–10.3)
Chloride: 100 mmol/L (ref 98–111)
GFR calc Af Amer: 43 mL/min — ABNORMAL LOW (ref >60)
GFR calc non Af Amer: 37 mL/min — ABNORMAL LOW (ref >60)
Glucose, Bld: 280 mg/dL — ABNORMAL HIGH (ref 70–99)
Potassium: 4.1 mmol/L (ref 3.5–5.1)
SODIUM: 137 mmol/L (ref 135–145)

## 2018-02-18 MED ORDER — FUROSEMIDE 40 MG PO TABS: 40.0000 mg | ORAL_TABLET | Freq: Two times a day (BID) | ORAL | 6 refills | Status: DC

## 2018-02-18 MED ORDER — LOSARTAN POTASSIUM 50 MG PO TABS: 50.0000 mg | ORAL_TABLET | Freq: Every day | ORAL | 6 refills | Status: DC

## 2018-02-18 MED ORDER — POTASSIUM CHLORIDE CRYS ER 20 MEQ PO TBCR: 20.0000 meq | EXTENDED_RELEASE_TABLET | Freq: Two times a day (BID) | ORAL | 6 refills | Status: DC

## 2018-02-18 NOTE — Progress Notes (Signed)
Patient ID: Pamela [name], female   DOB: 11-27-1955, 62 y.o.   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 with h/o DM2, obesity, HTN, chronic Afib on Xarelto and chronic diastolic HF, echo 09/2010 with EF 55%.   She underwent cardiac cath in April 2012 which showed normal coronaries. Did very well with rate control and diuresis. Started on coumadin. We saw her in May and switched her to Xarelto due to problems regulating INRs. She underwent DC-CV in June 2012 which failed so we opted for rate control strategy.  Echo was done in 3/19, showing EF 60-65% with mild LVH.   She returns for followup of atrial fibrillation and diastolic CHF.  She remains in atrial fibrillation, rate controlled. She has felt palpitations more recently. She has been more short of breath recently as well, dyspneic walking 75-100 yards. Very short of breath with stairs.  Also limited by bilateral knee pain. No chest pain.  Weight is up 8 lbs.  She has had a right foot wound, followed by podiatry.   ECG (personally reviewed): atrial fibrillation at 93 bpm, low voltage.   Labs (6/14): Creatinine 1.3, Potassium 4.4, LDL 75, HDL 56, K 4.4, creatinine 1.3 Labs (3/15): K 4, creatinine 1.06, BNP 741 Labs (4/16): hgb 14.7 Labs (11/16): K 3.8, creatinine 1.25, LDL 83, HDL 47 Labs (4/17): K 4, creatinine 1.49, LDL 63, HDL 43 Labs (9/17): K 3.6, creatinine 1.29, LDL 49, HDL 49 Labs (8/18): TSH normal, Hgb 12.9, K 3.7, creatinine 1.29 Labs (3/19): LDL 53, HDL 43, hgb 12.1, K 4.1, creatinine 1.46  ROS: All systems negative except as listed in HPI, PMH and Problem List.  Past Medical History:  Diagnosis Date  . ALLERGIC RHINITIS 12/31/2007  . ANXIETY 12/31/2007  . Atrial fibrillation (HCC)   . DEPRESSION 12/31/2007  . DIABETES MELLITUS, TYPE II 12/31/2007  . Diastolic dysfunction 10/11/2010  . Hyperlipidemia 04/23/2012  . HYPERTENSION 12/31/2007  . Migraine   . PNA (pneumonia)    in her 5s  . Right knee DJD   . Rosacea  10/11/2010    Current Outpatient Medications  Medication Sig Dispense Refill  . albuterol (PROVENTIL HFA;VENTOLIN HFA) 108 (90 Base) MCG/ACT inhaler Inhale 2 puffs into the lungs every 6 (six) hours as needed for wheezing or shortness of breath. 1 Inhaler 0  . atorvastatin (LIPITOR) 10 MG tablet TAKE 1 TABLET BY MOUTH EVERY DAY 90 tablet 3  . cetirizine (ZYRTEC) 10 MG tablet TAKE 1 TABLET(10 MG) BY MOUTH DAILY 30 tablet 2  . cyclobenzaprine (FLEXERIL) 5 MG tablet Take 1 tablet (5 mg total) by mouth 3 (three) times daily as needed for muscle spasms. 60 tablet 1  . diltiazem (CARDIZEM CD) 360 MG 24 hr capsule TAKE 1 CAPSULE(360 MG) BY MOUTH DAILY 30 capsule 0  . furosemide (LASIX) 40 MG tablet Take 1 tablet (40 mg total) by mouth 2 (two) times daily. 60 tablet 6  . glipiZIDE (GLUCOTROL XL) 10 MG 24 hr tablet TAKE 1 TABLET(10 MG) BY MOUTH DAILY WITH BREAKFAST 90 tablet 3  . losartan (COZAAR) 50 MG tablet Take 1 tablet (50 mg total) by mouth daily. 30 tablet 6  . metFORMIN (GLUCOPHAGE) 1000 MG tablet TAKE 1 TABLET(1000 MG) BY MOUTH TWICE DAILY WITH A MEAL 180 tablet 2  . metoprolol succinate (TOPROL-XL) 100 MG 24 hr tablet Take 1 tablet (100 mg total) 2 (two) times daily by mouth. 180 tablet 3  . mometasone (ELOCON) 0.1 % cream Apply  1 application topically 2 (two) times daily. 45 g 0  . mupirocin ointment (BACTROBAN) 2 % Apply 1 application topically 2 (two) times daily. 30 g 2  . Omega-3 Fatty Acids (FISH OIL) 1000 MG CAPS Take 2 capsules (2,000 mg total) by mouth 2 (two) times daily. 60 capsule 0  . potassium chloride SA (K-DUR,KLOR-CON) 20 MEQ tablet Take 1 tablet (20 mEq total) by mouth 2 (two) times daily. 60 tablet 6  . rivaroxaban (XARELTO) 20 MG TABS tablet TAKE 1 TABLET BY MOUTH EVERY DAY WITH SUPPER 30 tablet 2  . spironolactone (ALDACTONE) 25 MG tablet Take 1 tablet (25 mg total) by mouth daily. 90 tablet 0  . traMADol (ULTRAM) 50 MG tablet Take 1 tablet (50 mg total) by mouth every 6  (six) hours as needed. 30 tablet 0  . Vitamin D, Ergocalciferol, (DRISDOL) 50000 units CAPS capsule TAKE 1 CAPSULE BY MOUTH EVERY 7 DAYS 12 capsule 0   No current facility-administered medications for this encounter.    PHYSICAL EXAM: Vitals:   02/18/18 0853  BP: (!) 152/80  Pulse: 99  SpO2: 96%  Weight: (!) 157.4 kg (347 lb)   General: NAD, obese Neck: JVP 8-9 cm, no thyromegaly or thyroid nodule.  Lungs: Clear to auscultation bilaterally with normal respiratory effort. CV: Nondisplaced PMI.  Heart mildly tachy, irregular S1/S2, no S3/S4, no murmur.  Trace ankle edema.  No carotid bruit.  Difficult to palpate pedal pulses.  Abdomen: Soft, nontender, no hepatosplenomegaly, no distention.  Skin: Intact without lesions or rashes.  Neurologic: Alert and oriented x 3.  Psych: Normal affect. Extremities: No clubbing or cyanosis. Right foot wrapped . HEENT: Normal.   ASSESSMENT & PLAN: 1. Atrial fibrillation: Chronic.  Stable rate control.   - Continue Xarelto, diltiazem CD, Toprol XL. 2. Chronic diastolic CHF: NYHA class II symptoms, weight is up with volume overload on exam.   - Increase Lasix to 60 qam/40 qpm for 5 days, then to 40 mg bid after that.  BMET today and again in 10 days.  3. HTN: BP high, increase losartan to 50 mg daily.  4. OSA: She is reluctant to have a sleep study.  5. Obesity: I again recommended diet and exercise for weight loss.  6. Hyperlipidemia: Good lipids in 3/19.  7. Right foot wound: I will arrange for peripheral arterial dopplers to assess for PAD.   Followup 3-4 weeks with APP to reassess volume.   Pamela [name] 02/18/2018

## 2018-02-18 NOTE — Patient Instructions (Addendum)
Increase Furosemide to 60 mg in Am and 40 mg in PM for 5 DAYS ONLY, then take 40 mg Twice daily   Increase Potassium to 20 meq Twice daily   Increase Losartan to 50 mg daily  Labs today  Labs in 10 days  Your physician has requested that you have a lower extremity arterial exercise duplex. During this test, exercise and ultrasound are used to evaluate arterial blood flow in the legs. Allow one hour for this exam. There are no restrictions or special instructions.  Your physician recommends that you schedule a follow-up appointment in: 3-4 weeks with APP

## 2018-02-19 NOTE — Progress Notes (Signed)
Subjective: 62 year old female presents the office today for follow-up evaluation of wounds to both of her feet.  Her left foot continues to do well without any ulceration.  On the right side she says the wound is getting better but she has difficulty seeing it.  She denies any redness or drainage or any swelling.  She has been keeping ointment on the area as discussed.  She does not wear the offloading boot she is been wearing regular shoes. No acute changes. She denies any fevers, chills, nausea, vomiting.  No calf pain, chest pain, shortness of breath.  No other concerns today.  Objective: AAO x3, NAD DP/PT pulses 1/4bilaterally, CRT less than 3 seconds Chronic Charcot changes present.  There is prominence along lateral aspect of the foot on the right side.  On the lateral aspect of the metatarsal base the wound appears to be healing well and there is only one ulceration present which measures about the same at 0.2 x 0.2 cm however starting to scab over and almost healed and appears to be more superficial.  Most of the erythema, ascending cellulitis.  There is no fluctuation or crepitation or any malodor.  No other ulcerations are identified bilaterally.  No pain with calf compression, swelling, warmth, erythema  Assessment: Ulceration right lateral foot with healed ulceration left foot  Plan: -All treatment options discussed with the patient including all alternatives, risks, complications.  -Today I sharply debrided the wounds on the right foot without any complications or bleeding to healthy, granular wound base utilizing #312 blade scalpel.  The area was cleaned.  Antibiotic ointment and a bandage was applied.  We will continue with daily dressing changes.  I want her to continue with cam boot for offloading. She presented today wearing a sandal and she only wears the boot at work some.  -Continue offloading the left hallux to prevent recurrence.  Dispensed offloading pads. -Monitor for any  clinical signs or symptoms of infection and directed to call the office immediately should any occur or go to the ER.  Vivi BarrackMatthew R Wagoner DPM

## 2018-02-24 ENCOUNTER — Inpatient Hospital Stay (HOSPITAL_COMMUNITY): Admission: RE | Admit: 2018-02-24 | Payer: Managed Care, Other (non HMO) | Source: Ambulatory Visit

## 2018-03-03 ENCOUNTER — Other Ambulatory Visit (HOSPITAL_COMMUNITY): Payer: Self-pay | Admitting: *Deleted

## 2018-03-03 ENCOUNTER — Telehealth (HOSPITAL_COMMUNITY): Payer: Self-pay | Admitting: Vascular Surgery

## 2018-03-03 DIAGNOSIS — L97521 Non-pressure chronic ulcer of other part of left foot limited to breakdown of skin: Principal | ICD-10-CM

## 2018-03-03 DIAGNOSIS — E11621 Type 2 diabetes mellitus with foot ulcer: Secondary | ICD-10-CM

## 2018-03-03 NOTE — Telephone Encounter (Signed)
Left pt message , letting pt know that [facility] will be calling her for her LEA

## 2018-03-04 ENCOUNTER — Other Ambulatory Visit (HOSPITAL_COMMUNITY): Payer: Self-pay

## 2018-03-04 ENCOUNTER — Ambulatory Visit (HOSPITAL_COMMUNITY)
Admission: RE | Admit: 2018-03-04 | Discharge: 2018-03-04 | Disposition: A | Discharge status: Home / Self Care | Payer: Managed Care, Other (non HMO) | Source: Ambulatory Visit | Attending: Cardiology | Admitting: Cardiology

## 2018-03-04 DIAGNOSIS — I5032 Chronic diastolic (congestive) heart failure: Secondary | ICD-10-CM

## 2018-03-04 LAB — BASIC METABOLIC PANEL
ANION GAP: 16 — AB (ref 5–15)
BUN: 30 mg/dL — AB (ref 8–23)
CHLORIDE: 99 mmol/L (ref 98–111)
CO2: 24 mmol/L (ref 22–32)
Calcium: 9.2 mg/dL (ref 8.9–10.3)
Creatinine, Ser: 1.74 mg/dL — ABNORMAL HIGH (ref 0.44–1.00)
GFR calc Af Amer: 35 mL/min — ABNORMAL LOW (ref >60)
GFR, EST NON AFRICAN AMERICAN: 30 mL/min — AB (ref >60)
GLUCOSE: 237 mg/dL — AB (ref 70–99)
POTASSIUM: 3.9 mmol/L (ref 3.5–5.1)
Sodium: 139 mmol/L (ref 135–145)

## 2018-03-04 MED ORDER — LOSARTAN POTASSIUM 25 MG PO TABS: 25.0000 mg | ORAL_TABLET | Freq: Every day | ORAL | 3 refills | Status: DC

## 2018-03-04 MED ORDER — AMLODIPINE BESYLATE 2.5 MG PO TABS: 2.5000 mg | ORAL_TABLET | Freq: Every day | ORAL | 3 refills | Status: DC

## 2018-03-04 NOTE — Telephone Encounter (Signed)
Left message for pt to call back  °

## 2018-03-05 ENCOUNTER — Inpatient Hospital Stay (HOSPITAL_COMMUNITY): Admission: RE | Admit: 2018-03-05 | Payer: Managed Care, Other (non HMO) | Source: Ambulatory Visit

## 2018-03-09 ENCOUNTER — Ambulatory Visit: Payer: Managed Care, Other (non HMO) | Admitting: Podiatry

## 2018-03-09 ENCOUNTER — Ambulatory Visit (INDEPENDENT_AMBULATORY_CARE_PROVIDER_SITE_OTHER): Payer: Managed Care, Other (non HMO)

## 2018-03-09 ENCOUNTER — Encounter: Payer: Self-pay | Admitting: Podiatry

## 2018-03-09 VITALS — Temp 97.8°F

## 2018-03-09 DIAGNOSIS — B351 Tinea unguium: Secondary | ICD-10-CM

## 2018-03-09 DIAGNOSIS — E1149 Type 2 diabetes mellitus with other diabetic neurological complication: Secondary | ICD-10-CM | POA: Diagnosis not present

## 2018-03-09 DIAGNOSIS — L97511 Non-pressure chronic ulcer of other part of right foot limited to breakdown of skin: Secondary | ICD-10-CM | POA: Diagnosis not present

## 2018-03-09 DIAGNOSIS — M79674 Pain in right toe(s): Secondary | ICD-10-CM

## 2018-03-09 DIAGNOSIS — M79675 Pain in left toe(s): Secondary | ICD-10-CM

## 2018-03-09 MED ORDER — DOXYCYCLINE HYCLATE 100 MG PO TABS: 100.0000 mg | ORAL_TABLET | Freq: Two times a day (BID) | ORAL | 0 refills | Status: DC

## 2018-03-12 ENCOUNTER — Ambulatory Visit (HOSPITAL_COMMUNITY)
Admission: RE | Admit: 2018-03-12 | Discharge: 2018-03-12 | Disposition: A | Discharge status: Home / Self Care | Payer: Managed Care, Other (non HMO) | Source: Ambulatory Visit | Attending: Cardiology | Admitting: Cardiology

## 2018-03-12 DIAGNOSIS — I5032 Chronic diastolic (congestive) heart failure: Secondary | ICD-10-CM | POA: Diagnosis present

## 2018-03-12 LAB — BASIC METABOLIC PANEL
Anion gap: 13 (ref 5–15)
BUN: 22 mg/dL (ref 8–23)
CHLORIDE: 105 mmol/L (ref 98–111)
CO2: 22 mmol/L (ref 22–32)
CREATININE: 1.71 mg/dL — AB (ref 0.44–1.00)
Calcium: 9.7 mg/dL (ref 8.9–10.3)
GFR calc non Af Amer: 31 mL/min — ABNORMAL LOW (ref >60)
GFR, EST AFRICAN AMERICAN: 36 mL/min — AB (ref >60)
Glucose, Bld: 199 mg/dL — ABNORMAL HIGH (ref 70–99)
POTASSIUM: 3.9 mmol/L (ref 3.5–5.1)
Sodium: 140 mmol/L (ref 135–145)

## 2018-03-14 NOTE — Progress Notes (Signed)
Subjective: 62 year old female presents the office today for follow-up evaluation of wounds to her right foot.  The left foot is been doing well.  She states that she been having some more pain in the right foot that she was having previously.  She denies any drainage or pus coming from the wound he should continue daily dressing changes.  She thinks that the wound is improving.   She also states her nails need to be trimmed as are causing pain and elongated.  Denies any drainage or pus come from the toenail sites.  Denies any fevers, chills, nausea, vomiting.  No calf pain, chest pain, shortness of breath.  No other concerns today.  Objective: AAO x3, NAD DP/PT pulses 1/4bilaterally, CRT less than 3 seconds Chronic Charcot changes present.  There is prominence along lateral aspect of the foot on the right side.  On the lateral aspect of the metatarsal base the wound appears to be healing well and there is only one ulceration present which measures about the same at 0.2 x 0.2 cm .  Wound is the same in diameter.  However he has a granular wound base.  There is no surrounding erythema, ascending cellulitis but there is no fluctuation uppercase or any malodor.  Problems with fifth metatarsal base. Nails are hypertrophic, dystrophic, brittle, discolored, elongated 10. No surrounding redness or drainage. Tenderness nails 1-5 bilaterally.  Left hallux toenail has fresh blood along the area this seems that it almost got pulled back some but there is no open sore. No pain with calf compression, swelling, warmth, erythema  Assessment: Ulceration right lateral foot with healed ulceration left foot; symptomatic onychomycosis  Plan: -All treatment options discussed with the patient including all alternatives, risks, complications.  -X-rays were obtained reviewed.  No evidence of cortical destruction suggestive also myelitis.  Significant arthritic changes present. -Today I sharply debrided the wounds on  the right foot without any complications or bleeding to healthy, granular wound base utilizing #312 blade scalpel.  The area was cleaned.  Medihoney was applied.  We will continue with dressing turgor daily.  Offloading all times. -Continue offloading the left hallux to prevent recurrence.  Dispensed offloading pads. -Nails debrided x10 without any complications or bleeding.  There was fresh blood on the left hallux toenail.  Recommend Intermedic ointment and a bandage daily. -Monitor for any clinical signs or symptoms of infection and directed to call the office immediately should any occur or go to the ER.  Vivi Barrack DPM

## 2018-03-17 ENCOUNTER — Ambulatory Visit: Payer: Managed Care, Other (non HMO) | Admitting: Podiatry

## 2018-03-17 ENCOUNTER — Other Ambulatory Visit (HOSPITAL_COMMUNITY): Payer: Self-pay

## 2018-03-17 DIAGNOSIS — L600 Ingrowing nail: Secondary | ICD-10-CM

## 2018-03-17 DIAGNOSIS — L97512 Non-pressure chronic ulcer of other part of right foot with fat layer exposed: Secondary | ICD-10-CM | POA: Diagnosis not present

## 2018-03-17 DIAGNOSIS — I70235 Atherosclerosis of native arteries of right leg with ulceration of other part of foot: Secondary | ICD-10-CM | POA: Diagnosis not present

## 2018-03-17 DIAGNOSIS — E0843 Diabetes mellitus due to underlying condition with diabetic autonomic (poly)neuropathy: Secondary | ICD-10-CM

## 2018-03-17 MED ORDER — DILTIAZEM HCL ER COATED BEADS 360 MG PO CP24: 360.0000 mg | ORAL_CAPSULE | Freq: Every day | ORAL | 0 refills | Status: DC

## 2018-03-17 MED ORDER — SULFAMETHOXAZOLE-TRIMETHOPRIM 800-160 MG PO TABS: 1.0000 | ORAL_TABLET | Freq: Two times a day (BID) | ORAL | 0 refills | Status: DC

## 2018-03-21 ENCOUNTER — Other Ambulatory Visit: Payer: Self-pay | Admitting: Internal Medicine

## 2018-03-21 LAB — WOUND CULTURE
MICRO NUMBER:: 91220405
SPECIMEN QUALITY: ADEQUATE

## 2018-03-22 NOTE — Progress Notes (Signed)
Patient ID: Pamela [name], female   DOB: 04/12/1956, 62 y.o.   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 with h/o DM2, obesity, HTN, chronic Afib on Xarelto and chronic diastolic HF, echo 09/2010 with EF 55%.   She underwent cardiac cath in April 2012 which showed normal coronaries. Did very well with rate control and diuresis. Started on coumadin. We saw her in May and switched her to Xarelto due to problems regulating INRs. She underwent DC-CV in June 2012 which failed so we opted for rate control strategy.  Echo was done in 3/19, showing EF 60-65% with mild LVH.   She returns today for HF follow up. Last visit losartan and lasix were increased. Overall doing fine. SOB has improved, but still does not feel like she is at her baseline. She is SOB after walking about 100 yards. Activity limited by knee pain. She has been urinating more with the increased lasix and stays up all night urinating. Takes second dose of lasix with evening pills. She has occasional bilateral foot edema (R>L). No orthopnea or PND. She has occasional cough that she thinks is related to allergies. Sometimes with clear sputum. No fever or chills. No dizziness. She has chronic chest discomfort that she relates to feeling her afib. It is unchanged. Podiatry following right foot wound. Weight up 5 lbs on our scale. She does not weigh at home. Taking all medications. Tries to limit fluid and salt.   ECG (personally reviewed): atrial fibrillation at 93 bpm, low voltage.   Labs (6/14): Creatinine 1.3, Potassium 4.4, LDL 75, HDL 56, K 4.4, creatinine 1.3 Labs (3/15): K 4, creatinine 1.06, BNP 741 Labs (4/16): hgb 14.7 Labs (11/16): K 3.8, creatinine 1.25, LDL 83, HDL 47 Labs (4/17): K 4, creatinine 1.49, LDL 63, HDL 43 Labs (9/17): K 3.6, creatinine 1.29, LDL 49, HDL 49 Labs (8/18): TSH normal, Hgb 12.9, K 3.7, creatinine 1.29 Labs (3/19): LDL 53, HDL 43, hgb 12.1, K 4.1, creatinine 1.46  Review of systems complete and  found to be negative unless listed in HPI.   Past Medical History:  Diagnosis Date  . ALLERGIC RHINITIS 12/31/2007  . ANXIETY 12/31/2007  . Atrial fibrillation (HCC)   . DEPRESSION 12/31/2007  . DIABETES MELLITUS, TYPE II 12/31/2007  . Diastolic dysfunction 10/11/2010  . Hyperlipidemia 04/23/2012  . HYPERTENSION 12/31/2007  . Migraine   . PNA (pneumonia)    in her 70s  . Right knee DJD   . Rosacea 10/11/2010    Current Outpatient Medications  Medication Sig Dispense Refill  . albuterol (PROVENTIL HFA;VENTOLIN HFA) 108 (90 Base) MCG/ACT inhaler Inhale 2 puffs into the lungs every 6 (six) hours as needed for wheezing or shortness of breath. 1 Inhaler 0  . amLODipine (NORVASC) 2.5 MG tablet Take 1 tablet (2.5 mg total) by mouth daily. 30 tablet 3  . atorvastatin (LIPITOR) 10 MG tablet TAKE 1 TABLET BY MOUTH EVERY DAY 90 tablet 3  . cetirizine (ZYRTEC) 10 MG tablet TAKE 1 TABLET(10 MG) BY MOUTH DAILY 30 tablet 2  . cyclobenzaprine (FLEXERIL) 5 MG tablet Take 1 tablet (5 mg total) by mouth 3 (three) times daily as needed for muscle spasms. 60 tablet 1  . diltiazem (CARDIZEM CD) 360 MG 24 hr capsule Take 1 capsule (360 mg total) by mouth daily. 30 capsule 0  . furosemide (LASIX) 40 MG tablet Take 1 tablet (40 mg total) by mouth 2 (two) times daily. May also take 0.5 tablets (20  mg total) as needed. 90 tablet 6  . glipiZIDE (GLUCOTROL XL) 10 MG 24 hr tablet TAKE 1 TABLET(10 MG) BY MOUTH DAILY WITH BREAKFAST 90 tablet 0  . losartan (COZAAR) 25 MG tablet Take 1 tablet (25 mg total) by mouth daily. 30 tablet 3  . losartan (COZAAR) 50 MG tablet Take 1 tablet (50 mg total) by mouth daily. 30 tablet 6  . metFORMIN (GLUCOPHAGE) 1000 MG tablet TAKE 1 TABLET(1000 MG) BY MOUTH TWICE DAILY WITH A MEAL 180 tablet 2  . metoprolol succinate (TOPROL-XL) 100 MG 24 hr tablet Take 1 tablet (100 mg total) 2 (two) times daily by mouth. 180 tablet 3  . mometasone (ELOCON) 0.1 % cream Apply 1 application topically 2  (two) times daily. 45 g 0  . mupirocin ointment (BACTROBAN) 2 % Apply 1 application topically 2 (two) times daily. 30 g 2  . Omega-3 Fatty Acids (FISH OIL) 1000 MG CAPS Take 2 capsules (2,000 mg total) by mouth 2 (two) times daily. 60 capsule 0  . potassium chloride SA (K-DUR,KLOR-CON) 20 MEQ tablet Take 1 tablet (20 mEq total) by mouth 2 (two) times daily. 60 tablet 6  . rivaroxaban (XARELTO) 20 MG TABS tablet TAKE 1 TABLET BY MOUTH EVERY DAY WITH SUPPER 30 tablet 2  . spironolactone (ALDACTONE) 25 MG tablet Take 1 tablet (25 mg total) by mouth daily. 90 tablet 0  . sulfamethoxazole-trimethoprim (BACTRIM DS,SEPTRA DS) 800-160 MG tablet Take 1 tablet by mouth 2 (two) times daily. 20 tablet 0  . traMADol (ULTRAM) 50 MG tablet Take 1 tablet (50 mg total) by mouth every 6 (six) hours as needed. 30 tablet 0  . Vitamin D, Ergocalciferol, (DRISDOL) 50000 units CAPS capsule TAKE 1 CAPSULE BY MOUTH EVERY 7 DAYS 12 capsule 0   No current facility-administered medications for this encounter.    PHYSICAL EXAM: Vitals:   03/23/18 0903  BP: 124/74  Pulse: 84  SpO2: 98%  Weight: (!) 159.8 kg (352 lb 6 oz)   General: Obese. No resp difficulty. HEENT: Normal Neck: Supple. JVP difficult but does not appear elevated. Carotids 2+ bilat; no bruits. No thyromegaly or nodule noted. Cor: PMI nondisplaced. IRR, No M/G/R noted Lungs: CTAB, normal effort. Abdomen: Soft, non-tender, non-distended, no HSM. No bruits or masses. +BS  Extremities: No cyanosis, clubbing, or rash. R and LLE trace edema (R>L). Right foot wrapped with ACE Neuro: Alert & orientedx3, cranial nerves grossly intact. moves all 4 extremities w/o difficulty. Affect pleasant   ASSESSMENT & PLAN: 1. Atrial fibrillation: Chronic. Rate well controlled. - Continue Xarelto, diltiazem CD 360 mg daily, Toprol XL 100 mg BID. 2. Chronic diastolic CHF: NYHA class II-III symptoms. Volume status looks okay on exam despite weight gain. - Continue lasix  40 mg bid. Instructed her to take additional 40 mg PRN. She can take today. I told her to take lasix doses about 6 hours apart with second dose in early afternoon to keep her from urinating all night.  Creatinine and K stable on 10/4.  - Continue spiro 25 mg daily  3. HTN: Continue losartan 50 mg daily and amlodipine 2.5 mg daily. Well controlled today.  4. OSA: She is reluctant to have a sleep study. No change 5. Obesity: Recommended diet and exercise for weight loss.  6. Hyperlipidemia: Good lipids in 3/19. No change 7. Right foot wound: peripheral arterial dopplers ordered to assess for PAD. She is going to have this done at [facility] and will call them to schedule appointment. -  Following with podiatry.   Follow up in 3 months with Dr Herbie Saxon Dwan Bolt 03/23/2018  Greater than 50% of the 25 minute visit was spent in counseling/coordination of care regarding disease state education, salt/fluid restriction, sliding scale diuretics, and medication compliance.

## 2018-03-23 ENCOUNTER — Other Ambulatory Visit: Payer: Self-pay

## 2018-03-23 ENCOUNTER — Ambulatory Visit (HOSPITAL_COMMUNITY)
Admission: RE | Admit: 2018-03-23 | Discharge: 2018-03-23 | Disposition: A | Discharge status: Home / Self Care | Payer: Managed Care, Other (non HMO) | Source: Ambulatory Visit | Attending: Cardiology | Admitting: Cardiology

## 2018-03-23 ENCOUNTER — Encounter (HOSPITAL_COMMUNITY): Payer: Self-pay

## 2018-03-23 ENCOUNTER — Telehealth: Payer: Self-pay | Admitting: Podiatry

## 2018-03-23 VITALS — BP 124/74 | HR 84 | Wt 352.4 lb

## 2018-03-23 DIAGNOSIS — Z7901 Long term (current) use of anticoagulants: Secondary | ICD-10-CM | POA: Diagnosis not present

## 2018-03-23 DIAGNOSIS — I482 Chronic atrial fibrillation, unspecified: Secondary | ICD-10-CM | POA: Diagnosis not present

## 2018-03-23 DIAGNOSIS — F419 Anxiety disorder, unspecified: Secondary | ICD-10-CM | POA: Diagnosis not present

## 2018-03-23 DIAGNOSIS — E11621 Type 2 diabetes mellitus with foot ulcer: Secondary | ICD-10-CM

## 2018-03-23 DIAGNOSIS — I110 Hypertensive heart disease with heart failure: Secondary | ICD-10-CM | POA: Insufficient documentation

## 2018-03-23 DIAGNOSIS — G4733 Obstructive sleep apnea (adult) (pediatric): Secondary | ICD-10-CM | POA: Diagnosis not present

## 2018-03-23 DIAGNOSIS — Z7984 Long term (current) use of oral hypoglycemic drugs: Secondary | ICD-10-CM | POA: Diagnosis not present

## 2018-03-23 DIAGNOSIS — E119 Type 2 diabetes mellitus without complications: Secondary | ICD-10-CM | POA: Insufficient documentation

## 2018-03-23 DIAGNOSIS — E785 Hyperlipidemia, unspecified: Secondary | ICD-10-CM | POA: Diagnosis not present

## 2018-03-23 DIAGNOSIS — F329 Major depressive disorder, single episode, unspecified: Secondary | ICD-10-CM | POA: Insufficient documentation

## 2018-03-23 DIAGNOSIS — E669 Obesity, unspecified: Secondary | ICD-10-CM | POA: Diagnosis not present

## 2018-03-23 DIAGNOSIS — I5032 Chronic diastolic (congestive) heart failure: Secondary | ICD-10-CM

## 2018-03-23 DIAGNOSIS — I10 Essential (primary) hypertension: Secondary | ICD-10-CM | POA: Diagnosis not present

## 2018-03-23 DIAGNOSIS — L97521 Non-pressure chronic ulcer of other part of left foot limited to breakdown of skin: Secondary | ICD-10-CM

## 2018-03-23 DIAGNOSIS — Z79899 Other long term (current) drug therapy: Secondary | ICD-10-CM | POA: Diagnosis not present

## 2018-03-23 MED ORDER — FUROSEMIDE 40 MG PO TABS: ORAL_TABLET | ORAL | 6 refills | Status: DC

## 2018-03-23 NOTE — Patient Instructions (Signed)
Be sure to take your Lasix twice a day, try for 6 am and 12 noon Remember you may take and additional 20 mg, as needed for weight gain (3lbs overnight or 5 lbs in a week) or swelling  Your physician recommends that you schedule a follow-up appointment in: 3 months with Dr Shirlee Latch   Do the following things EVERYDAY: 1) Weigh yourself in the morning before breakfast. Write it down and keep it in a log. 2) Take your medicines as prescribed 3) Eat low salt foods-Limit salt (sodium) to 2000 mg per day.  4) Stay as active as you can everyday 5) Limit all fluids for the day to less than 2 liters

## 2018-03-23 NOTE — Telephone Encounter (Signed)
I was in last week and a wound culture was done on my right foot. I have not heard anything and I was calling to see if the results are back. My number is [phone] if you get the voicemail, please leave a message as its okay. Thank you.

## 2018-03-23 NOTE — Telephone Encounter (Signed)
Informed pt that wound culture results were in and to keep apt for Dr. Ardelle Anton to go over results next week on 04/01/2018.

## 2018-03-28 NOTE — Progress Notes (Signed)
   Subjective:  Patient presents today for follow-up evaluation regarding an ulcer to the right lateral aspect of the foot.  Patient was last seen on 03/09/2018 where debridement was performed and she is currently applying meta honey to the ulceration site.  She says that the meta honey is improving her symptoms.  She states that her right foot hurts and there is been increased drainage lately. She also complains of an ingrown toenail to the lateral border of the left hallux.  This is been ongoing for several weeks now.  She would like it addressed today.   Objective/Physical Exam General: The patient is alert and oriented x3 in no acute distress.  Dermatology:  Wound #1 noted to the lateral aspect of the right foot measuring approximately 1.0 x 1.0 x 0.7 cm (LxWxD).   To the noted ulceration(s), there is no eschar. There is a moderate amount of slough, fibrin, and necrotic tissue noted. Granulation tissue and wound base is red. There is a minimal amount of serosanguineous drainage noted. There is no exposed bone muscle-tendon ligament or joint. There is no malodor. Periwound integrity is intact. Skin is warm, dry and supple bilateral lower extremities.  Incurvated nail also noted to the lateral border left great toe consistent with paronychia with mild drainage.  Vascular: Palpable pedal pulses bilaterally.  There is some erythema and edema noted to the right foot consistent with a localized cellulitis. Capillary refill within normal limits.  Neurological: Epicritic and protective threshold diminished bilaterally.   Musculoskeletal Exam: Range of motion within normal limits to all pedal and ankle joints bilateral. Muscle strength 5/5 in all groups bilateral.   Assessment: #1  Right lateral foot ulceration secondary to diabetes mellitus #2 diabetes mellitus w/ peripheral neuropathy #3 paronychia left hallux lateral border   Plan of Care:  #1 Patient was evaluated. #2 medically necessary  excisional debridement including subcutaneous tissue was performed using a tissue nipper and a chisel blade. Excisional debridement of all the necrotic nonviable tissue down to healthy bleeding viable tissue was performed with post-debridement measurements same as pre-. #3 the wound was cleansed and dry sterile dressing applied. #4  Partial temporary nail avulsion was performed to the lateral border of the left hallux.  Prior to procedure 3 mL of 2% lidocaine plain infiltrated in a hallux block fashion.  Toe was prepped in aseptic manner prior to procedure.  Dry sterile dressing applied. #5 prescription for Bactrim DS #20.  Discontinue doxycycline.  Cultures taken today and sent for culture and sensitivity.  It appears that the doxycycline is not improving the patient's cellulitis symptoms. #6 continue meta honey daily to the right foot ulceration #7 continue immobilization cam boot right lower extremity #8 return to clinic in 10 days   Felecia Shelling, DPM [facility]  Dr. Felecia Shelling, DPM    [address]                                        [place], [place] [address]                Office [phone]  Fax [phone]

## 2018-04-01 ENCOUNTER — Encounter: Payer: Self-pay | Admitting: Podiatry

## 2018-04-01 ENCOUNTER — Ambulatory Visit: Payer: Managed Care, Other (non HMO) | Admitting: Podiatry

## 2018-04-01 DIAGNOSIS — L97512 Non-pressure chronic ulcer of other part of right foot with fat layer exposed: Secondary | ICD-10-CM

## 2018-04-01 DIAGNOSIS — E0843 Diabetes mellitus due to underlying condition with diabetic autonomic (poly)neuropathy: Secondary | ICD-10-CM

## 2018-04-01 DIAGNOSIS — L600 Ingrowing nail: Secondary | ICD-10-CM

## 2018-04-04 NOTE — Progress Notes (Signed)
Subjective: 62 year old female presents the office today for follow-up evaluation after undergoing partial nail avulsion to her left big toe with Dr. Logan Bores last week as well as for wound on the outside aspect of the right foot.  She says that both issues are doing much better.  She has no pain and she has not had any drainage or pus.  No redness.  She can continue the antibiotics with Dr. Logan Bores prescribed without any side effects.  She has no other concerns. Denies any systemic complaints such as fevers, chills, nausea, vomiting. No acute changes since last appointment, and no other complaints at this time.   Objective: AAO x3, NAD DP/PT pulses palpable bilaterally, CRT less than 3 seconds Status post partial nail avulsion of the left lateral corner.  There is no drainage or pus and there is faint surrounding edema and erythema but there is no ascending cellulitis.  Is no fluctuation or crepitation or any malodor.  Ulceration present lateral aspect the right fourth metatarsal base with hyperkeratotic tissue.  Upon debridement almost a pinpoint type opening is much improved.  There is no probing to bone, undermining or tunneling.  There is concerning erythema but this is likely more from inflammation as opposed to infection.  There is no drainage or pus there is no increase in warmth.  No pain. No open lesions or pre-ulcerative lesions.  No pain with calf compression, swelling, warmth, erythema  Assessment: Resolving paronychia left foot with healing wound right foot  Plan: -All treatment options discussed with the patient including all alternatives, risks, complications.  -Left hallux toenail appears to be doing well.  Continue antibiotic ointment dressing changes daily.  There appears to be almost healed. -Sharply to the wound on the right foot without any complications or bleeding utilizing #312 blade scalpel down to healthy, granular tissue.  Continue with daily dressing changes with Medihoney as  well.  Continue to wear surgical shoes to help offload the area which she is not wearing.  Monitoring signs or symptoms of infection. -Patient encouraged to call the office with any questions, concerns, change in symptoms.   Vivi Barrack DPM

## 2018-04-13 ENCOUNTER — Other Ambulatory Visit: Payer: Self-pay | Admitting: Internal Medicine

## 2018-04-15 ENCOUNTER — Ambulatory Visit: Payer: Managed Care, Other (non HMO) | Admitting: Podiatry

## 2018-04-15 DIAGNOSIS — L97512 Non-pressure chronic ulcer of other part of right foot with fat layer exposed: Secondary | ICD-10-CM | POA: Diagnosis not present

## 2018-04-15 DIAGNOSIS — L03115 Cellulitis of right lower limb: Secondary | ICD-10-CM

## 2018-04-15 DIAGNOSIS — E0843 Diabetes mellitus due to underlying condition with diabetic autonomic (poly)neuropathy: Secondary | ICD-10-CM

## 2018-04-15 MED ORDER — DOXYCYCLINE HYCLATE 100 MG PO TABS: 100.0000 mg | ORAL_TABLET | Freq: Two times a day (BID) | ORAL | 0 refills | Status: DC

## 2018-04-18 NOTE — Progress Notes (Signed)
Subjective: 62 year old female presents the office today for follow-up evaluation of an ulceration to the lateral aspect of the right foot.  She states that the area is painful and has been somewhat red but she denies any drainage or pus or any red streaks.  She said some ongoing pain to the right foot but is gotten mildly worse.  She said her left foot is doing well.  She states that she had a "bug" over the weekend and she had a fever of 102 however that did come down.  Today her temperature is 97.9.  Denies any systemic complaints such as fevers, chills, nausea, vomiting. No acute changes since last appointment, and no other complaints at this time.   Objective: AAO x3, NAD DP/PT pulses palpable bilaterally, CRT less than 3 seconds Status post partial nail avulsion of the left lateral corner.  This area appears to be healed there is no ulceration identified in the left lower extremity today. On the right foot there is a hyperkeratotic lesion of the fifth metatarsal base of prominence of the fifth metatarsal base.  Upon debridement there is still underlying ulceration measuring 1 x 0.3 x 0.2 cm.  The wound is almost a crescent type shape.  There is no probing to bone, undermining or tunneling.  There is surrounding erythema however there is no ascending cellulitis.  Mild increase in pain directly on the wound.  There is no fluctuation or crepitation or any malodor. No open lesions or pre-ulcerative lesions.  No pain with calf compression, swelling, warmth, erythema      Assessment: Right foot ulceration with localized cellulitis  Plan: -All treatment options discussed with the patient including all alternatives, risks, complications.  -Today I sharply debrided the wound in the right foot without any complications utilizing #312 blade scalpel as well as a tissue nipper to remove all nonviable tissue down to healthy, granular wound base. -Prescribed doxycycline.  She is been wearing regular  shoes on her with the offloading boot. -Monitor for signs or symptoms of worsening infection she should go to immediately to the emergency room should any occur. -Patient encouraged to call the office with any questions, concerns, change in symptoms.   Vivi Barrack DPM

## 2018-04-21 ENCOUNTER — Other Ambulatory Visit (HOSPITAL_COMMUNITY): Payer: Self-pay | Admitting: Cardiology

## 2018-04-23 ENCOUNTER — Ambulatory Visit (INDEPENDENT_AMBULATORY_CARE_PROVIDER_SITE_OTHER): Payer: Managed Care, Other (non HMO)

## 2018-04-23 ENCOUNTER — Ambulatory Visit: Payer: Managed Care, Other (non HMO) | Admitting: Podiatry

## 2018-04-23 DIAGNOSIS — M14671 Charcot's joint, right ankle and foot: Secondary | ICD-10-CM | POA: Diagnosis not present

## 2018-04-23 DIAGNOSIS — L97512 Non-pressure chronic ulcer of other part of right foot with fat layer exposed: Secondary | ICD-10-CM | POA: Diagnosis not present

## 2018-04-23 DIAGNOSIS — L03115 Cellulitis of right lower limb: Secondary | ICD-10-CM

## 2018-04-23 MED ORDER — DOXYCYCLINE HYCLATE 100 MG PO TABS: 100.0000 mg | ORAL_TABLET | Freq: Two times a day (BID) | ORAL | 0 refills | Status: DC

## 2018-04-24 ENCOUNTER — Other Ambulatory Visit: Payer: Self-pay | Admitting: Internal Medicine

## 2018-04-26 ENCOUNTER — Other Ambulatory Visit (HOSPITAL_COMMUNITY): Payer: Self-pay

## 2018-04-26 MED ORDER — SPIRONOLACTONE 25 MG PO TABS: 25.0000 mg | ORAL_TABLET | Freq: Every day | ORAL | 0 refills | Status: DC

## 2018-04-26 MED ORDER — RIVAROXABAN 20 MG PO TABS: ORAL_TABLET | ORAL | 2 refills | Status: DC

## 2018-04-26 MED ORDER — METOPROLOL SUCCINATE ER 100 MG PO TB24: 100.0000 mg | ORAL_TABLET | Freq: Two times a day (BID) | ORAL | 3 refills | Status: DC

## 2018-04-27 NOTE — Progress Notes (Signed)
   Subjective: 62 year old female presents the office today for follow-up evaluation of an ulceration to the lateral aspect of the right foot.  She states that overall is getting better but she still has pain to the also aspect of her right foot.  She can continue with daily dressing changes.  She denies any drainage or pus that she can see.  Otherwise she states that she is doing well she has no other open lesions in the left foot is doing well.  She denies any fevers, chills, nausea, vomiting.  No calf pain, chest pain, shortness of breath.   Objective: AAO x3, NAD DP/PT pulses palpable bilaterally, CRT less than 3 seconds No ulcerations present on the left foot and there is no pain or swelling or redness to the toenail site. On the right foot there is a hyperkeratotic lesion of the fifth metatarsal base of prominence of the fifth metatarsal base.  Upon debridement there is still underlying ulceration measuring 0.8 x 0.3 x 0.2 cm.  The wound is almost a crescent type shape.  Prior to debridement there is hyperkeratotic tissue overlying the area of this measurements after debridement.  There is still some localized edema and erythema to the area but overall appears to be improved.  There is no fluctuation or crepitation or any malodor.  No ascending cellulitis.  No open lesions or pre-ulcerative lesions.  No pain with calf compression, swelling, warmth, erythema     Assessment: Right foot ulceration with localized cellulitis, improved  Plan: -All treatment options discussed with the patient including all alternatives, risks, complications.  -X-rays were obtained and reviewed.  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  No definitive evidence of acute osteomyelitis there is no soft tissue edema. -Today I sharply debrided the wound in the right foot without any complications utilizing #312 blade scalpel as well as a tissue nipper to remove all nonviable tissue down to healthy, granular wound base. -Chronic  continue doxycycline given the redness.  This was refilled today. -We discussed the etiology and progression of Charcot today at length. -Continue with cam boot.  She has never wants to the office but she states that she wears otherwise. -Monitor for signs or symptoms of worsening infection she should go to immediately to the emergency room should any occur. -Patient encouraged to call the office with any questions, concerns, change in symptoms.   Vivi BarrackMatthew R Kemond Amorin DPM

## 2018-05-04 ENCOUNTER — Ambulatory Visit: Payer: Managed Care, Other (non HMO) | Admitting: Podiatry

## 2018-05-04 DIAGNOSIS — L97512 Non-pressure chronic ulcer of other part of right foot with fat layer exposed: Secondary | ICD-10-CM

## 2018-05-04 DIAGNOSIS — E1149 Type 2 diabetes mellitus with other diabetic neurological complication: Secondary | ICD-10-CM

## 2018-05-10 NOTE — Progress Notes (Signed)
   Subjective: 62 year old female presents the office today for follow-up evaluation of an ulceration to the lateral aspect of the right foot.  Overall she states the pain is better on the right foot.  She does not look at her foot as it is hard for her to see it but she has not noticed any drainage or pus coming from the area.  Is been keeping dressings on a daily.  She is been using  Medihoney.   She denies any other new open sores or any other issues.  She has no other concerns. She denies any fevers, chills, nausea, vomiting.  No calf pain, chest pain, shortness of breath.   Objective: AAO x3, NAD DP/PT pulses palpable bilaterally, CRT less than 3 seconds No ulcerations present on the left foot and there is no pain or swelling or redness to the toenail site. On the right foot there is a hyperkeratotic lesion of the fifth metatarsal base of prominence of the fifth metatarsal base.  Upon debridement there is still underlying ulceration measuring 0.7 x 0.3 x 0.2 cm.  The wound is almost a crescent type shape and slightly smaller in size but overall about the same.  There is decreased edema erythema to the area there is no ascending cellulitis. There is no drainage or pus there is no fluctuation or crepitation or any malodor.   Wounds on left foot have healed  no open lesions or pre-ulcerative lesions.  No pain with calf compression, swelling, warmth, erythema  Assessment: Right foot ulceration with improved erythema  Plan: -All treatment options discussed with the patient including all alternatives, risks, complications.  -I sharply debrided the wound in the right foot without any complications utilizing #312 blade scalpel as well as a tissue nipper to remove all nonviable tissue down to healthy, granular wound base. -Finish course of antibiotics -Continue with cam boot.  She is not wearing this to the office.  Offloading the wound at all times. -Monitor for signs or symptoms of worsening  infection she should go to immediately to the emergency room should any occur. -Patient encouraged to call the office with any questions, concerns, change in symptoms.   Vivi BarrackMatthew R Math Brazie DPM

## 2018-05-12 ENCOUNTER — Other Ambulatory Visit: Payer: Self-pay | Admitting: Internal Medicine

## 2018-06-10 ENCOUNTER — Other Ambulatory Visit: Payer: Self-pay | Admitting: Internal Medicine

## 2018-06-22 ENCOUNTER — Encounter (HOSPITAL_COMMUNITY): Payer: Managed Care, Other (non HMO) | Admitting: Cardiology

## 2018-06-29 ENCOUNTER — Ambulatory Visit (HOSPITAL_COMMUNITY)
Admission: RE | Admit: 2018-06-29 | Discharge: 2018-06-29 | Disposition: A | Discharge status: Home / Self Care | Payer: Managed Care, Other (non HMO) | Source: Ambulatory Visit | Attending: Cardiology | Admitting: Cardiology

## 2018-06-29 ENCOUNTER — Encounter (HOSPITAL_COMMUNITY): Payer: Self-pay | Admitting: Cardiology

## 2018-06-29 VITALS — BP 142/82 | HR 99 | Wt 341.4 lb

## 2018-06-29 DIAGNOSIS — I110 Hypertensive heart disease with heart failure: Secondary | ICD-10-CM | POA: Insufficient documentation

## 2018-06-29 DIAGNOSIS — I5032 Chronic diastolic (congestive) heart failure: Secondary | ICD-10-CM | POA: Diagnosis not present

## 2018-06-29 DIAGNOSIS — R0683 Snoring: Secondary | ICD-10-CM

## 2018-06-29 DIAGNOSIS — E785 Hyperlipidemia, unspecified: Secondary | ICD-10-CM | POA: Diagnosis not present

## 2018-06-29 DIAGNOSIS — I482 Chronic atrial fibrillation, unspecified: Secondary | ICD-10-CM | POA: Diagnosis not present

## 2018-06-29 DIAGNOSIS — Z79899 Other long term (current) drug therapy: Secondary | ICD-10-CM | POA: Diagnosis not present

## 2018-06-29 DIAGNOSIS — Z7901 Long term (current) use of anticoagulants: Secondary | ICD-10-CM | POA: Diagnosis not present

## 2018-06-29 DIAGNOSIS — Z7984 Long term (current) use of oral hypoglycemic drugs: Secondary | ICD-10-CM | POA: Insufficient documentation

## 2018-06-29 DIAGNOSIS — E119 Type 2 diabetes mellitus without complications: Secondary | ICD-10-CM | POA: Diagnosis not present

## 2018-06-29 LAB — LIPID PANEL
Cholesterol: 139 mg/dL (ref 0–200)
HDL: 47 mg/dL (ref >40)
LDL CALC: 72 mg/dL (ref 0–99)
Total CHOL/HDL Ratio: 3 RATIO
Triglycerides: 99 mg/dL (ref <150)
VLDL: 20 mg/dL (ref 0–40)

## 2018-06-29 LAB — BASIC METABOLIC PANEL
ANION GAP: 12 (ref 5–15)
BUN: 16 mg/dL (ref 8–23)
CALCIUM: 9.4 mg/dL (ref 8.9–10.3)
CO2: 23 mmol/L (ref 22–32)
CREATININE: 1.49 mg/dL — AB (ref 0.44–1.00)
Chloride: 100 mmol/L (ref 98–111)
GFR calc Af Amer: 43 mL/min — ABNORMAL LOW (ref >60)
GFR, EST NON AFRICAN AMERICAN: 37 mL/min — AB (ref >60)
GLUCOSE: 249 mg/dL — AB (ref 70–99)
Potassium: 4.1 mmol/L (ref 3.5–5.1)
SODIUM: 135 mmol/L (ref 135–145)

## 2018-06-29 MED ORDER — LOSARTAN POTASSIUM 50 MG PO TABS: 50.0000 mg | ORAL_TABLET | Freq: Every day | ORAL | 3 refills | Status: DC

## 2018-06-29 NOTE — Patient Instructions (Signed)
STOP Amlodipine.  INCREASE Losartan to 50mg  daily.  Routine lab work today. Will notify you of abnormal results  Repeat labs in 10 days.  Follow up in 6 months.

## 2018-06-29 NOTE — Progress Notes (Signed)
Patient ID: Pamela [name], female   DOB: Nov 04, 1955, 63 y.o.   MRN: [id number] PCP: Dr. Jacquelynn CreeJohn  Pamela [name] is a 63 y.o. with h/o DM2, obesity, HTN, chronic Afib on Xarelto and chronic diastolic HF, echo 09/2010 with EF 55%.   She underwent cardiac cath in April 2012 which showed normal coronaries. Did very well with rate control and diuresis. Started on coumadin. We saw her in May and switched her to Xarelto due to problems regulating INRs. She underwent DC-CV in June 2012 which failed so we opted for rate control strategy.  Echo was done in 3/19, showing EF 60-65% with mild LVH.   She returns for followup of atrial fibrillation and diastolic CHF.  She remains in atrial fibrillation, rate controlled.  She has stable dyspnea after walking about 100 yards.  Occasional atypical chest pain (no exertional chest pain).  She has a right Charcot foot with ongoing foot pain, followed by podiatry. Weight is down 11 lbs.  BP is mildly elevated today.   Labs (6/14): Creatinine 1.3, Potassium 4.4, LDL 75, HDL 56, K 4.4, creatinine 1.3 Labs (3/15): K 4, creatinine 1.06, BNP 741 Labs (4/16): hgb 14.7 Labs (11/16): K 3.8, creatinine 1.25, LDL 83, HDL 47 Labs (4/17): K 4, creatinine 1.49, LDL 63, HDL 43 Labs (9/17): K 3.6, creatinine 1.29, LDL 49, HDL 49 Labs (8/18): TSH normal, Hgb 12.9, K 3.7, creatinine 1.29 Labs (3/19): LDL 53, HDL 43, hgb 12.1, K 4.1, creatinine 1.46 Labs (10/19): K 3.9, creatinine 1.71  ABIs (10/19): Normal.   ROS: All systems negative except as listed in HPI, PMH and Problem List.  Past Medical History:  Diagnosis Date  . ALLERGIC RHINITIS 12/31/2007  . ANXIETY 12/31/2007  . Atrial fibrillation (HCC)   . DEPRESSION 12/31/2007  . DIABETES MELLITUS, TYPE II 12/31/2007  . Diastolic dysfunction 10/11/2010  . Hyperlipidemia 04/23/2012  . HYPERTENSION 12/31/2007  . Migraine   . PNA (pneumonia)    in her 63  . Right knee DJD   . Rosacea 10/11/2010    Current Outpatient Medications   Medication Sig Dispense Refill  . atorvastatin (LIPITOR) 10 MG tablet TAKE 1 TABLET BY MOUTH EVERY DAY 90 tablet 0  . cetirizine (ZYRTEC) 10 MG tablet TAKE 1 TABLET(10 MG) BY MOUTH DAILY 30 tablet 0  . cyclobenzaprine (FLEXERIL) 5 MG tablet Take 1 tablet (5 mg total) by mouth 3 (three) times daily as needed for muscle spasms. 60 tablet 1  . diltiazem (CARDIZEM CD) 360 MG 24 hr capsule TAKE 1 CAPSULE(360 MG) BY MOUTH DAILY 30 capsule 3  . furosemide (LASIX) 40 MG tablet Take 1 tablet (40 mg total) by mouth 2 (two) times daily. May also take 0.5 tablets (20 mg total) as needed. 90 tablet 6  . glipiZIDE (GLUCOTROL XL) 10 MG 24 hr tablet TAKE 1 TABLET(10 MG) BY MOUTH DAILY WITH BREAKFAST 90 tablet 0  . losartan (COZAAR) 50 MG tablet Take 1 tablet (50 mg total) by mouth daily. 30 tablet 3  . metFORMIN (GLUCOPHAGE) 1000 MG tablet TAKE 1 TABLET(1000 MG) BY MOUTH TWICE DAILY WITH A MEAL 180 tablet 0  . metoprolol succinate (TOPROL-XL) 100 MG 24 hr tablet Take 1 tablet (100 mg total) by mouth 2 (two) times daily. 180 tablet 3  . mometasone (ELOCON) 0.1 % cream Apply 1 application topically 2 (two) times daily. 45 g 0  . mupirocin ointment (BACTROBAN) 2 % Apply 1 application topically 2 (two) times daily. 30 g 2  . Omega-3 Fatty  Acids (FISH OIL) 1000 MG CAPS Take 2 capsules (2,000 mg total) by mouth 2 (two) times daily. 60 capsule 0  . potassium chloride SA (K-DUR,KLOR-CON) 20 MEQ tablet Take 1 tablet (20 mEq total) by mouth 2 (two) times daily. (Patient taking differently: Take 20 mEq by mouth daily. ) 60 tablet 6  . rivaroxaban (XARELTO) 20 MG TABS tablet TAKE 1 TABLET BY MOUTH EVERY DAY WITH SUPPER 30 tablet 2  . spironolactone (ALDACTONE) 25 MG tablet Take 1 tablet (25 mg total) by mouth daily. 90 tablet 0  . traMADol (ULTRAM) 50 MG tablet Take 1 tablet (50 mg total) by mouth every 6 (six) hours as needed. 30 tablet 0  . albuterol (PROVENTIL HFA;VENTOLIN HFA) 108 (90 Base) MCG/ACT inhaler Inhale 2 puffs  into the lungs every 6 (six) hours as needed for wheezing or shortness of breath. (Patient not taking: Reported on 06/29/2018) 1 Inhaler 0   No current facility-administered medications for this encounter.    PHYSICAL EXAM: Vitals:   06/29/18 0839  BP: (!) 142/82  Pulse: 99  SpO2: 98%  Weight: (!) 154.9 kg (341 lb 6.4 oz)   General: NAD, obese Neck: No JVD, no thyromegaly or thyroid nodule.  Lungs: Clear to auscultation bilaterally with normal respiratory effort. CV: Nondisplaced PMI.  Heart irregular S1/S2, no S3/S4, no murmur.  Trace edema.  No carotid bruit.  Normal pedal pulses.  Abdomen: Soft, nontender, no hepatosplenomegaly, no distention.  Skin: Intact without lesions or rashes.  Neurologic: Alert and oriented x 3.  Psych: Normal affect. Extremities: No clubbing or cyanosis. Right foot ulcer is wrapped.  HEENT: Normal.    ASSESSMENT & PLAN: 1. Atrial fibrillation: Chronic.  Stable rate control.   - Continue Xarelto, diltiazem CD, Toprol XL. 2. Chronic diastolic CHF: NYHA class II symptoms, Weight is down and she looks euvolemic on exam. - Continue Lasix 40 mg bid, BMET today.  3. HTN: BP is mildly elevated.  She is taking both amlodipine and diltiazem CD.  - Stop amlodipine, continue diltiazem CD.  - I will increase losartan to 50 mg daily.  BMET today and again in 10 days.   4. OSA: She has been reluctant to have a sleep study but is willing to do a home sleep study.  I will try to arrange this.  5. Obesity: She is losing weight.  6. Hyperlipidemia: Check lipids today.   7. 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  ABIs were normal in 11/19. She is followed by podiatry.   Followup in 6 months.    Pamela [name] Pamela [name] 06/29/2018

## 2018-06-30 ENCOUNTER — Other Ambulatory Visit: Payer: Self-pay | Admitting: Internal Medicine

## 2018-07-08 ENCOUNTER — Ambulatory Visit (HOSPITAL_COMMUNITY)
Admission: RE | Admit: 2018-07-08 | Discharge: 2018-07-08 | Disposition: A | Discharge status: Home / Self Care | Payer: Managed Care, Other (non HMO) | Source: Ambulatory Visit | Attending: Cardiology | Admitting: Cardiology

## 2018-07-08 DIAGNOSIS — I5032 Chronic diastolic (congestive) heart failure: Secondary | ICD-10-CM | POA: Diagnosis not present

## 2018-07-08 LAB — BASIC METABOLIC PANEL
Anion gap: 14 (ref 5–15)
BUN: 19 mg/dL (ref 8–23)
CO2: 22 mmol/L (ref 22–32)
Calcium: 8.8 mg/dL — ABNORMAL LOW (ref 8.9–10.3)
Chloride: 100 mmol/L (ref 98–111)
Creatinine, Ser: 1.46 mg/dL — ABNORMAL HIGH (ref 0.44–1.00)
GFR calc Af Amer: 44 mL/min — ABNORMAL LOW (ref >60)
GFR calc non Af Amer: 38 mL/min — ABNORMAL LOW (ref >60)
Glucose, Bld: 215 mg/dL — ABNORMAL HIGH (ref 70–99)
POTASSIUM: 4 mmol/L (ref 3.5–5.1)
Sodium: 136 mmol/L (ref 135–145)

## 2018-07-13 ENCOUNTER — Other Ambulatory Visit: Payer: Self-pay | Admitting: Internal Medicine

## 2018-07-17 ENCOUNTER — Other Ambulatory Visit (HOSPITAL_COMMUNITY): Payer: Self-pay | Admitting: Cardiology

## 2018-07-19 ENCOUNTER — Ambulatory Visit: Payer: Managed Care, Other (non HMO) | Admitting: Podiatry

## 2018-07-22 ENCOUNTER — Ambulatory Visit: Payer: Managed Care, Other (non HMO) | Admitting: Podiatry

## 2018-07-22 ENCOUNTER — Encounter: Payer: Self-pay | Admitting: Podiatry

## 2018-07-22 DIAGNOSIS — E1149 Type 2 diabetes mellitus with other diabetic neurological complication: Secondary | ICD-10-CM

## 2018-07-22 DIAGNOSIS — L97511 Non-pressure chronic ulcer of other part of right foot limited to breakdown of skin: Secondary | ICD-10-CM

## 2018-07-22 DIAGNOSIS — L03115 Cellulitis of right lower limb: Secondary | ICD-10-CM

## 2018-07-22 MED ORDER — MUPIROCIN 2 % EX OINT: 1.0000 "application " | TOPICAL_OINTMENT | Freq: Two times a day (BID) | CUTANEOUS | 2 refills | Status: DC

## 2018-07-22 MED ORDER — DOXYCYCLINE HYCLATE 100 MG PO TABS: 100.0000 mg | ORAL_TABLET | Freq: Two times a day (BID) | ORAL | 0 refills | Status: DC

## 2018-07-24 ENCOUNTER — Other Ambulatory Visit: Payer: Self-pay | Admitting: Internal Medicine

## 2018-07-26 NOTE — Progress Notes (Signed)
Subjective: Ms. Stgermaine presents to the office today for follow-up evaluation of a wound on the right foot.  She states that it still become very painful last week.  She held off on coming into the office because I was not and and she did not want seen other physician.  She denies any drainage or pus coming from the area but she has noted some mild redness.  No red streaks. Denies any systemic complaints such as fevers, chills, nausea, vomiting. No acute changes since last appointment, and no other complaints at this time.   Objective: AAO x3, NAD DP/PT pulses palpable bilaterally, CRT less than 3 seconds Thick hyperkeratotic lesion present on the fifth metatarsal base on the right foot on the lateral aspect.  There is localized erythema to the area but there is no ascending cellulitis.  Upon debridement there is a very small superficial opening but there is no probing to bone, undermining or tunneling.  There is no fluctuation crepitation or malodor.   No open lesions or pre-ulcerative lesions.  No pain with calf compression, swelling, warmth, erythema  Assessment: Ulceration right lateral foot with localized erythema  Plan: -All treatment options discussed with the patient including all alternatives, risks, complications.  -That should be debrided the hyperkeratotic lesion as well as the wound without any complications.  Would continue antibiotic ointment dressings and prescribe mupirocin ointment.  Also prescribed Keflex because of the erythema.  Monitoring signs or symptoms of worsening infection to the ER should any occur. -Patient encouraged to call the office with any questions, concerns, change in symptoms.   Return in about 2 weeks (around 08/05/2018).  Vivi Barrack DPM

## 2018-08-05 ENCOUNTER — Encounter: Payer: Self-pay | Admitting: Podiatry

## 2018-08-05 ENCOUNTER — Ambulatory Visit: Payer: Managed Care, Other (non HMO) | Admitting: Podiatry

## 2018-08-05 DIAGNOSIS — E1149 Type 2 diabetes mellitus with other diabetic neurological complication: Secondary | ICD-10-CM | POA: Diagnosis not present

## 2018-08-05 DIAGNOSIS — L97511 Non-pressure chronic ulcer of other part of right foot limited to breakdown of skin: Secondary | ICD-10-CM | POA: Diagnosis not present

## 2018-08-06 NOTE — Progress Notes (Signed)
Subjective: Pamela [name] presents to the office today for follow-up evaluation of a wound on the right foot.  Overall she states that she is doing much better since last appointment.  She said the pain is resolved.  She has not seen any significant drainage she denies any pus.  Denies any red streaks.  She is continue to wear a wide open back shoe.  She has no new concerns today.  She denies any fevers, chills, nausea, vomiting.  No calf pain, chest pain, shortness of breath.  Denies any systemic complaints such as fevers, chills, nausea, vomiting. No acute changes since last appointment, and no other complaints at this time.   Objective: AAO x3, NAD DP/PT pulses palpable bilaterally, CRT less than 3 seconds Hyperkeratotic lesion present on the fifth metatarsal base on the right foot on the lateral aspect.  There is a mild rim of erythema to the area but overall this is much improved I think this erythema is more from the localized inflammation as opposed to infection.  There is no ascending cellulitis.  After debrided the hyperkeratotic lesion appears that the wound is healed.  There was one very small superficial abrasion type lesion present this is more for the scab came off.  There is no probing, undermining or tunneling.  Overall there is no significant pain to palpation and she clinically is doing better. No other open lesions or pre-ulcerative lesions.  No pain with calf compression, swelling, warmth, erythema  Assessment: Ulceration right lateral foot with localized erythema  Plan: -All treatment options discussed with the patient including all alternatives, risks, complications.  -That should be debrided the hyperkeratotic lesion seen without any complications.  Overall appears to be doing much better and almost completely healed.  She had one area that was is a very small superficial type lesion that this is where the scab came off.  I want to keep antibiotic ointment on this area the next  couple of days and once that heals I want her to start using moisturizer to the callus area daily.  Offloading at all times.  Since the area is doing much better I will see her back in 2 months however encourage her to keep a very close eye on this and if they were to open back up or cause any issues she is to call and let me know immediately and she verbalized understanding.  Vivi Barrack DPM

## 2018-08-15 ENCOUNTER — Other Ambulatory Visit (HOSPITAL_COMMUNITY): Payer: Self-pay | Admitting: Cardiology

## 2018-08-24 ENCOUNTER — Other Ambulatory Visit: Payer: Self-pay

## 2018-08-24 ENCOUNTER — Other Ambulatory Visit (INDEPENDENT_AMBULATORY_CARE_PROVIDER_SITE_OTHER): Payer: Managed Care, Other (non HMO)

## 2018-08-24 ENCOUNTER — Encounter: Payer: Self-pay | Admitting: Internal Medicine

## 2018-08-24 ENCOUNTER — Ambulatory Visit (INDEPENDENT_AMBULATORY_CARE_PROVIDER_SITE_OTHER): Payer: Managed Care, Other (non HMO) | Admitting: Internal Medicine

## 2018-08-24 VITALS — BP 126/82 | HR 107 | Temp 97.8°F | Ht 72.0 in | Wt 334.0 lb

## 2018-08-24 DIAGNOSIS — Z1159 Encounter for screening for other viral diseases: Secondary | ICD-10-CM

## 2018-08-24 DIAGNOSIS — I10 Essential (primary) hypertension: Secondary | ICD-10-CM

## 2018-08-24 DIAGNOSIS — G63 Polyneuropathy in diseases classified elsewhere: Secondary | ICD-10-CM | POA: Diagnosis not present

## 2018-08-24 DIAGNOSIS — Z23 Encounter for immunization: Secondary | ICD-10-CM | POA: Diagnosis not present

## 2018-08-24 DIAGNOSIS — Z0001 Encounter for general adult medical examination with abnormal findings: Secondary | ICD-10-CM | POA: Diagnosis not present

## 2018-08-24 DIAGNOSIS — E11610 Type 2 diabetes mellitus with diabetic neuropathic arthropathy: Secondary | ICD-10-CM

## 2018-08-24 LAB — BASIC METABOLIC PANEL
BUN: 27 mg/dL — ABNORMAL HIGH (ref 6–23)
CO2: 28 meq/L (ref 19–32)
Calcium: 10.2 mg/dL (ref 8.4–10.5)
Chloride: 100 mEq/L (ref 96–112)
Creatinine, Ser: 1.72 mg/dL — ABNORMAL HIGH (ref 0.40–1.20)
GFR: 30 mL/min — ABNORMAL LOW (ref >60.00)
Glucose, Bld: 201 mg/dL — ABNORMAL HIGH (ref 70–99)
Potassium: 4.5 mEq/L (ref 3.5–5.1)
Sodium: 139 mEq/L (ref 135–145)

## 2018-08-24 LAB — CBC WITH DIFFERENTIAL/PLATELET
Basophils Absolute: 0.1 10*3/uL (ref 0.0–0.1)
Basophils Relative: 0.6 % (ref 0.0–3.0)
Eosinophils Absolute: 0.3 10*3/uL (ref 0.0–0.7)
Eosinophils Relative: 2.5 % (ref 0.0–5.0)
HCT: 38.4 % (ref 36.0–46.0)
Hemoglobin: 12.7 g/dL (ref 12.0–15.0)
Lymphocytes Relative: 15.4 % (ref 12.0–46.0)
Lymphs Abs: 1.8 10*3/uL (ref 0.7–4.0)
MCHC: 33.2 g/dL (ref 30.0–36.0)
MCV: 79.7 fl (ref 78.0–100.0)
MONO ABS: 0.6 10*3/uL (ref 0.1–1.0)
Monocytes Relative: 5.4 % (ref 3.0–12.0)
NEUTROS PCT: 76.1 % (ref 43.0–77.0)
Neutro Abs: 8.9 10*3/uL — ABNORMAL HIGH (ref 1.4–7.7)
Platelets: 274 10*3/uL (ref 150.0–400.0)
RBC: 4.82 Mil/uL (ref 3.87–5.11)
RDW: 17.3 % — ABNORMAL HIGH (ref 11.5–15.5)
WBC: 11.7 10*3/uL — ABNORMAL HIGH (ref 4.0–10.5)

## 2018-08-24 LAB — HEMOGLOBIN A1C: Hgb A1c MFr Bld: 8.7 % — ABNORMAL HIGH (ref 4.6–6.5)

## 2018-08-24 LAB — LIPID PANEL
Cholesterol: 181 mg/dL (ref 0–200)
HDL: 54.6 mg/dL (ref >39.00)
LDL Cholesterol: 103 mg/dL — ABNORMAL HIGH (ref 0–99)
NonHDL: 126.72
TRIGLYCERIDES: 119 mg/dL (ref 0.0–149.0)
Total CHOL/HDL Ratio: 3
VLDL: 23.8 mg/dL (ref 0.0–40.0)

## 2018-08-24 LAB — HEPATIC FUNCTION PANEL
ALBUMIN: 4.4 g/dL (ref 3.5–5.2)
ALT: 17 U/L (ref 0–35)
AST: 11 U/L (ref 0–37)
Alkaline Phosphatase: 51 U/L (ref 39–117)
Bilirubin, Direct: 0 mg/dL (ref 0.0–0.3)
Total Bilirubin: 0.3 mg/dL (ref 0.2–1.2)
Total Protein: 8 g/dL (ref 6.0–8.3)

## 2018-08-24 LAB — TSH: TSH: 2.38 u[IU]/mL (ref 0.35–4.50)

## 2018-08-24 LAB — MICROALBUMIN / CREATININE URINE RATIO
Creatinine,U: 62.5 mg/dL
Microalb Creat Ratio: 23.2 mg/g (ref 0.0–30.0)
Microalb, Ur: 14.5 mg/dL — ABNORMAL HIGH (ref 0.0–1.9)

## 2018-08-24 MED ORDER — PREGABALIN 50 MG PO CAPS: 50.0000 mg | ORAL_CAPSULE | Freq: Three times a day (TID) | ORAL | 5 refills | Status: DC

## 2018-08-24 MED ORDER — CETIRIZINE HCL 10 MG PO TABS: ORAL_TABLET | ORAL | 3 refills | Status: DC

## 2018-08-24 NOTE — Assessment & Plan Note (Signed)

## 2018-08-24 NOTE — Assessment & Plan Note (Addendum)
Ok to add lyrica asd  In addition to the time spent performing CPE, I spent an additional 15 minutes face to face,in which greater than 50% of this time was spent in counseling and coordination of care for patient's illness as documented, including the differential dx, treatment, further evaluation and other management of painful peripheral neuropathy, DM, HTN

## 2018-08-24 NOTE — Assessment & Plan Note (Signed)
stable overall by history and exam, recent data reviewed with pt, and pt to continue medical treatment as before,  to f/u any worsening symptoms or concerns  

## 2018-08-24 NOTE — Progress Notes (Signed)
Subjective:    Patient ID: Pamela [name], female    DOB: [birth year], 63 y.o.   MRN: [id number]  HPI  Here for wellness and f/u;  Overall doing ok;  Pt denies Chest pain, worsening SOB, DOE, wheezing, orthopnea, PND, worsening LE edema, palpitations, dizziness or syncope.  Pt denies neurological change such as new headache, facial or extremity weakness.  Pt denies polydipsia, polyuria, or low sugar symptoms. Pt states overall good compliance with treatment and medications, good tolerability, and has been trying to follow appropriate diet.  Pt denies worsening depressive symptoms, suicidal ideation or panic. No fever, night sweats, wt loss, loss of appetite, or other constitutional symptoms.  Pt states good ability with ADL's, has low fall risk, home safety reviewed and adequate, no other significant changes in hearing or vision, and not active with exercise.  Has been able to able to lose wt with loewr calorie diet recent.  To see GYN for pap mammogram in May 2020.   Wt Readings from Last 3 Encounters:  08/24/18 (!) 334 lb (151.5 kg)  06/29/18 (!) 341 lb 6.4 oz (154.9 kg)  03/23/18 (!) 352 lb 6 oz (159.8 kg)  C/o worsening peripheral neuropathy with increased neuritic pain, seeing podiatry for DFU right foot slow to heal, has /fu next month.  Has been on cymbalta and gabapentin intolerant.   Past Medical History:  Diagnosis Date  . ALLERGIC RHINITIS 12/31/2007  . ANXIETY 12/31/2007  . Atrial fibrillation (HCC)   . DEPRESSION 12/31/2007  . DIABETES MELLITUS, TYPE II 12/31/2007  . Diastolic dysfunction 10/11/2010  . Hyperlipidemia 04/23/2012  . HYPERTENSION 12/31/2007  . Migraine   . PNA (pneumonia)    in her 34s  . Right knee DJD   . Rosacea 10/11/2010   Past Surgical History:  Procedure Laterality Date  . 2 D&C     1980s    reports that she has never smoked. She has never used smokeless tobacco. She reports current alcohol use. She reports that she does not use drugs. family history includes  Arthritis in an other family member; Cancer in an other family member; Diabetes in an other family member; Heart disease in an other family member; Mental illness in an other family member; Stroke in an other family member. Allergies  Allergen Reactions  . Cymbalta [Duloxetine Hcl]     "loopiness"  . Gabapentin Itching   Current Outpatient Medications on File Prior to Visit  Medication Sig Dispense Refill  . albuterol (PROVENTIL HFA;VENTOLIN HFA) 108 (90 Base) MCG/ACT inhaler Inhale 2 puffs into the lungs every 6 (six) hours as needed for wheezing or shortness of breath. 1 Inhaler 0  . atorvastatin (LIPITOR) 10 MG tablet TAKE 1 TABLET BY MOUTH EVERY DAY 90 tablet 0  . cyclobenzaprine (FLEXERIL) 5 MG tablet Take 1 tablet (5 mg total) by mouth 3 (three) times daily as needed for muscle spasms. 60 tablet 1  . diltiazem (CARDIZEM CD) 360 MG 24 hr capsule TAKE 1 CAPSULE(360 MG) BY MOUTH DAILY 30 capsule 11  . furosemide (LASIX) 40 MG tablet Take 1 tablet (40 mg total) by mouth 2 (two) times daily. May also take 0.5 tablets (20 mg total) as needed. 90 tablet 6  . glipiZIDE (GLUCOTROL XL) 10 MG 24 hr tablet TAKE 1 TABLET(10 MG) BY MOUTH DAILY WITH BREAKFAST 90 tablet 0  . losartan (COZAAR) 50 MG tablet Take 1 tablet (50 mg total) by mouth daily. 30 tablet 3  . metFORMIN (GLUCOPHAGE) 1000 MG tablet  TAKE 1 TABLET(1000 MG) BY MOUTH TWICE DAILY WITH A MEAL 180 tablet 0  . metoprolol succinate (TOPROL-XL) 100 MG 24 hr tablet Take 1 tablet (100 mg total) by mouth 2 (two) times daily. 180 tablet 3  . mometasone (ELOCON) 0.1 % cream Apply 1 application topically 2 (two) times daily. 45 g 0  . mupirocin ointment (BACTROBAN) 2 % Apply 1 application topically 2 (two) times daily. 30 g 2  . Omega-3 Fatty Acids (FISH OIL) 1000 MG CAPS Take 2 capsules (2,000 mg total) by mouth 2 (two) times daily. 60 capsule 0  . potassium chloride SA (K-DUR,KLOR-CON) 20 MEQ tablet Take 1 tablet (20 mEq total) by mouth 2 (two) times  daily. (Patient taking differently: Take 20 mEq by mouth daily. ) 60 tablet 6  . spironolactone (ALDACTONE) 25 MG tablet TAKE 1 TABLET(25 MG) BY MOUTH DAILY 90 tablet 0  . traMADol (ULTRAM) 50 MG tablet Take 1 tablet (50 mg total) by mouth every 6 (six) hours as needed. 30 tablet 0  . XARELTO 20 MG TABS tablet TAKE 1 TABLET BY MOUTH EVERY DAY WITH SUPPER 30 tablet 2   No current facility-administered medications on file prior to visit.     Review of Systems Constitutional: Negative for other unusual diaphoresis, sweats, appetite or weight changes HENT: Negative for other worsening hearing loss, ear pain, facial swelling, mouth sores or neck stiffness.   Eyes: Negative for other worsening pain, redness or other visual disturbance.  Respiratory: Negative for other stridor or swelling Cardiovascular: Negative for other palpitations or other chest pain  Gastrointestinal: Negative for worsening diarrhea or loose stools, blood in stool, distention or other pain Genitourinary: Negative for hematuria, flank pain or other change in urine volume.  Musculoskeletal: Negative for myalgias or other joint swelling.  Skin: Negative for other color change, or other wound or worsening drainage.  Neurological: Negative for other syncope or numbness. Hematological: Negative for other adenopathy or swelling Psychiatric/Behavioral: Negative for hallucinations, other worsening agitation, SI, self-injury, or new decreased concentration All other system neg per pt    Objective:   Physical Exam BP 126/82   Pulse (!) 107   Temp 97.8 F (36.6 C) (Oral)   Ht 6' (1.829 m)   Wt (!) 334 lb (151.5 kg)   LMP 09/08/2010 (Exact Date)   SpO2 96%   BMI 45.30 kg/m  VS noted,  Constitutional: Pt is oriented to person, place, and time. Appears well-developed and well-nourished, in no significant distress and comfortable Head: Normocephalic and atraumatic  Eyes: Conjunctivae and EOM are normal. Pupils are equal, round,  and reactive to light Right Ear: External ear normal without discharge Left Ear: External ear normal without discharge Nose: Nose without discharge or deformity Mouth/Throat: Oropharynx is without other ulcerations and moist  Neck: Normal range of motion. Neck supple. No JVD present. No tracheal deviation present or significant neck LA or mass Cardiovascular: Normal rate, regular rhythm, normal heart sounds and intact distal pulses.   Pulmonary/Chest: WOB normal and breath sounds without rales or wheezing  Abdominal: Soft. Bowel sounds are normal. NT. No HSM  Musculoskeletal: Normal range of motion. Exhibits no edema Lymphadenopathy: Has no other cervical adenopathy.  Neurological: Pt is alert and oriented to person, place, and time. Pt has normal reflexes. No cranial nerve deficit. Motor grossly intact, Gait intact Skin: Skin is warm and dry. No rash noted or new ulcerations Psychiatric:  Has normal mood and affect. Behavior is normal without agitation No other exam findings  Lab Results  Component Value Date   WBC 9.9 02/18/2018   HGB 12.2 02/18/2018   HCT 38.5 02/18/2018   PLT 254 02/18/2018   GLUCOSE 215 (H) 07/08/2018   CHOL 139 06/29/2018   TRIG 99 06/29/2018   HDL 47 06/29/2018   LDLDIRECT 120.3 04/22/2012   LDLCALC 72 06/29/2018   ALT 22 03/03/2016   AST 14 03/03/2016   NA 136 07/08/2018   K 4.0 07/08/2018   CL 100 07/08/2018   CREATININE 1.46 (H) 07/08/2018   BUN 19 07/08/2018   CO2 22 07/08/2018   TSH 1.647 01/12/2017   INR 1.18 12/13/2010   HGBA1C 10.3 (H) 02/27/2017   MICROALBUR 11.7 (H) 09/20/2015       Assessment & Plan:

## 2018-08-24 NOTE — Patient Instructions (Addendum)
You had the Pneumovax pneumonia shot today  Please take all new medication as prescribed - the lyrica for pain  Please continue all other medications as before, and refills have been done if requested.  Please have the pharmacy call with any other refills you may need.  Please continue your efforts at being more active, low cholesterol diet, and weight control.  You are otherwise up to date with prevention measures today.  Please keep your appointments with your specialists as you may have planned  Please go to the LAB in the Basement (turn left off the elevator) for the tests to be done today  You will be contacted by phone if any changes need to be made immediately.  Otherwise, you will receive a letter about your results with an explanation, but please check with MyChart first.  Please remember to sign up for MyChart if you have not done so, as this will be important to you in the future with finding out test results, communicating by private email, and scheduling acute appointments online when needed.  Please return in 6 months, or sooner if needed, with Lab testing done 3-5 days before

## 2018-08-25 ENCOUNTER — Telehealth: Payer: Self-pay

## 2018-08-25 ENCOUNTER — Encounter: Payer: Self-pay | Admitting: Internal Medicine

## 2018-08-25 ENCOUNTER — Other Ambulatory Visit: Payer: Self-pay | Admitting: Internal Medicine

## 2018-08-25 LAB — HEPATITIS C ANTIBODY
Hepatitis C Ab: NONREACTIVE
SIGNAL TO CUT-OFF: 0.11 (ref <1.00)

## 2018-08-25 MED ORDER — EMPAGLIFLOZIN 25 MG PO TABS: 25.0000 mg | ORAL_TABLET | Freq: Every day | ORAL | 3 refills | Status: DC

## 2018-08-25 NOTE — Telephone Encounter (Signed)
Called pt, LVM.   CRM created.  

## 2018-08-25 NOTE — Telephone Encounter (Signed)
-----   Message from Corwin Levins, MD sent at 08/25/2018 12:06 PM EDT ----- Letter sent, cont same tx except  The test results show that your current treatment is OK, except although the A1c is improved, it is still moderately high.  We should add a medication called Jardiance 25 mg per day to help decrease the A1c, get better sugar control, and help with the foot healing.  I will send the prescription, and you should hear from the office as well    Shirron to please inform pt, I will do rx, and have pt call with name of similar med if this is not covered with her insurance

## 2018-09-06 ENCOUNTER — Telehealth: Payer: Self-pay | Admitting: *Deleted

## 2018-09-06 NOTE — Telephone Encounter (Signed)
Pamela [name] spoke to patient. She declines virtual visit and wants to keep OV on 09/07/18 with PCP.

## 2018-09-06 NOTE — Telephone Encounter (Signed)
I called pt Re:09/06/18 OV with Dr. [name] and due to COVID-19 pandemic. NO working numbers for patient on file except her work number. I spoke to a woman(patient's coworker) and she states she spoke to the patient and the patient will call us back. I informed the lady I spoke to we need to speak to patient today. However, due to HIPAA I did not specify why we needed to speak to her. We need to offer a virtual visit first. CRM created.

## 2018-09-07 ENCOUNTER — Encounter: Payer: Self-pay | Admitting: Internal Medicine

## 2018-09-07 ENCOUNTER — Ambulatory Visit (INDEPENDENT_AMBULATORY_CARE_PROVIDER_SITE_OTHER)
Admission: RE | Admit: 2018-09-07 | Discharge: 2018-09-07 | Disposition: A | Discharge status: Home / Self Care | Payer: Managed Care, Other (non HMO) | Source: Ambulatory Visit | Attending: Internal Medicine | Admitting: Internal Medicine

## 2018-09-07 ENCOUNTER — Other Ambulatory Visit: Payer: Self-pay

## 2018-09-07 ENCOUNTER — Ambulatory Visit (INDEPENDENT_AMBULATORY_CARE_PROVIDER_SITE_OTHER): Payer: Managed Care, Other (non HMO) | Admitting: Internal Medicine

## 2018-09-07 VITALS — BP 126/74 | HR 89 | Temp 98.0°F

## 2018-09-07 DIAGNOSIS — J069 Acute upper respiratory infection, unspecified: Secondary | ICD-10-CM

## 2018-09-07 DIAGNOSIS — R059 Cough, unspecified: Secondary | ICD-10-CM

## 2018-09-07 DIAGNOSIS — R05 Cough: Secondary | ICD-10-CM | POA: Diagnosis not present

## 2018-09-07 DIAGNOSIS — E11610 Type 2 diabetes mellitus with diabetic neuropathic arthropathy: Secondary | ICD-10-CM

## 2018-09-07 DIAGNOSIS — I10 Essential (primary) hypertension: Secondary | ICD-10-CM | POA: Diagnosis not present

## 2018-09-07 MED ORDER — HYDROCODONE-HOMATROPINE 5-1.5 MG/5ML PO SYRP: 5.0000 mL | ORAL_SOLUTION | Freq: Four times a day (QID) | ORAL | 0 refills | Status: AC | PRN

## 2018-09-07 MED ORDER — AZITHROMYCIN 250 MG PO TABS: ORAL_TABLET | ORAL | 1 refills | Status: DC

## 2018-09-07 NOTE — Assessment & Plan Note (Signed)
Also for cxr r/ pna,  to f/u any worsening symptoms or concerns

## 2018-09-07 NOTE — Patient Instructions (Signed)
Please take all new medication as prescribed - the antibiotic, and cough medicine as needed  Please continue all other medications as before, and refills have been done if requested.  Please have the pharmacy call with any other refills you may need.  Please continue your efforts at being more active, low cholesterol diet, and weight control.  Please keep your appointments with your specialists as you may have planned  Please go to the XRAY Department in the Basement (go straight as you get off the elevator) for the x-ray testing  You will be contacted by phone if any changes need to be made immediately.  Otherwise, you will receive a letter about your results with an explanation, but please check with MyChart first.  In order to help keep our patients safe and at home we are billing the insurance company for a health consult. You could potentially get a copay to a maximum of $15. Do you agree to this in order to obtain our advice about your concerns?

## 2018-09-07 NOTE — Assessment & Plan Note (Signed)
stable overall by history and exam, recent data reviewed with pt, and pt to continue medical treatment as before,  to f/u any worsening symptoms or concerns  

## 2018-09-07 NOTE — Progress Notes (Signed)
Subjective:    Patient ID: Pamela [name], female    DOB: January 25, 1956, 63 y.o.   MRN: [id number]  HPI   Here with 7 days acute onset fever, gradually worsening facial pain, pressure, headache, general weakness and malaise, and greenish d/c, with mild ST and cough just recently now with thick mucous pink tinged on the xarelto, but pt denies chest pain, wheezing, increased sob or doe (though has some sob at baseline) orthopnea, PND, increased LE swelling, palpitations, dizziness or syncope.  No overt bleeding on the xarelto.  Highest temp has been 100.7   Pt denies polydipsia, polyuria.  Past Medical History:  Diagnosis Date  . ALLERGIC RHINITIS 12/31/2007  . ANXIETY 12/31/2007  . Atrial fibrillation (HCC)   . DEPRESSION 12/31/2007  . DIABETES MELLITUS, TYPE II 12/31/2007  . Diastolic dysfunction 10/11/2010  . Hyperlipidemia 04/23/2012  . HYPERTENSION 12/31/2007  . Migraine   . PNA (pneumonia)    in her 55s  . Right knee DJD   . Rosacea 10/11/2010   Past Surgical History:  Procedure Laterality Date  . 2 D&C     1980s    reports that she has never smoked. She has never used smokeless tobacco. She reports current alcohol use. She reports that she does not use drugs. family history includes Arthritis in an other family member; Cancer in an other family member; Diabetes in an other family member; Heart disease in an other family member; Mental illness in an other family member; Stroke in an other family member. Allergies  Allergen Reactions  . Cymbalta [Duloxetine Hcl]     "loopiness"  . Gabapentin Itching   Current Outpatient Medications on File Prior to Visit  Medication Sig Dispense Refill  . albuterol (PROVENTIL HFA;VENTOLIN HFA) 108 (90 Base) MCG/ACT inhaler Inhale 2 puffs into the lungs every 6 (six) hours as needed for wheezing or shortness of breath. 1 Inhaler 0  . atorvastatin (LIPITOR) 10 MG tablet TAKE 1 TABLET BY MOUTH EVERY DAY 90 tablet 0  . cetirizine (ZYRTEC) 10 MG tablet TAKE  1 TABLET(10 MG) BY MOUTH DAILY 90 tablet 3  . cyclobenzaprine (FLEXERIL) 5 MG tablet Take 1 tablet (5 mg total) by mouth 3 (three) times daily as needed for muscle spasms. 60 tablet 1  . diltiazem (CARDIZEM CD) 360 MG 24 hr capsule TAKE 1 CAPSULE(360 MG) BY MOUTH DAILY 30 capsule 11  . empagliflozin (JARDIANCE) 25 MG TABS tablet Take 25 mg by mouth daily. 90 tablet 3  . furosemide (LASIX) 40 MG tablet Take 1 tablet (40 mg total) by mouth 2 (two) times daily. May also take 0.5 tablets (20 mg total) as needed. 90 tablet 6  . glipiZIDE (GLUCOTROL XL) 10 MG 24 hr tablet TAKE 1 TABLET(10 MG) BY MOUTH DAILY WITH BREAKFAST 90 tablet 0  . losartan (COZAAR) 50 MG tablet Take 1 tablet (50 mg total) by mouth daily. 30 tablet 3  . metFORMIN (GLUCOPHAGE) 1000 MG tablet TAKE 1 TABLET(1000 MG) BY MOUTH TWICE DAILY WITH A MEAL 180 tablet 0  . metoprolol succinate (TOPROL-XL) 100 MG 24 hr tablet Take 1 tablet (100 mg total) by mouth 2 (two) times daily. 180 tablet 3  . mometasone (ELOCON) 0.1 % cream Apply 1 application topically 2 (two) times daily. 45 g 0  . mupirocin ointment (BACTROBAN) 2 % Apply 1 application topically 2 (two) times daily. 30 g 2  . Omega-3 Fatty Acids (FISH OIL) 1000 MG CAPS Take 2 capsules (2,000 mg total) by mouth  2 (two) times daily. 60 capsule 0  . potassium chloride SA (K-DUR,KLOR-CON) 20 MEQ tablet Take 1 tablet (20 mEq total) by mouth 2 (two) times daily. (Patient taking differently: Take 20 mEq by mouth daily. ) 60 tablet 6  . pregabalin (LYRICA) 50 MG capsule Take 1 capsule (50 mg total) by mouth 3 (three) times daily. 90 capsule 5  . spironolactone (ALDACTONE) 25 MG tablet TAKE 1 TABLET(25 MG) BY MOUTH DAILY 90 tablet 0  . traMADol (ULTRAM) 50 MG tablet Take 1 tablet (50 mg total) by mouth every 6 (six) hours as needed. 30 tablet 0  . XARELTO 20 MG TABS tablet TAKE 1 TABLET BY MOUTH EVERY DAY WITH SUPPER 30 tablet 2   No current facility-administered medications on file prior to  visit.    Review of Systems  Constitutional: Negative for other unusual diaphoresis or sweats HENT: Negative for ear discharge or swelling Eyes: Negative for other worsening visual disturbances Respiratory: Negative for stridor or other swelling  Gastrointestinal: Negative for worsening distension or other blood Genitourinary: Negative for retention or other urinary change Musculoskeletal: Negative for other MSK pain or swelling Skin: Negative for color change or other new lesions Neurological: Negative for worsening tremors and other numbness  Psychiatric/Behavioral: Negative for worsening agitation or other fatigue All other system neg per pt    Objective:   Physical Exam BP 126/74   Pulse 89   Temp 98 F (36.7 C)   LMP 09/08/2010 (Exact Date)   SpO2 96%  VS noted, mild ill Constitutional: Pt appears in NAD HENT: Head: NCAT.  Right Ear: External ear normal.  Left Ear: External ear normal.  Bilat tm's with mild erythema.  Max sinus areas non tender.  Pharynx with mild erythema, no exudate Eyes: . Pupils are equal, round, and reactive to light. Conjunctivae and EOM are normal Nose: without d/c or deformity Neck: Neck supple. Gross normal ROM Cardiovascular: Normal rate and regular rhythm.   Pulmonary/Chest: Effort normal and breath sounds somewhat decreased without rales or wheezing.  Neurological: Pt is alert. At baseline orientation, motor grossly intact Skin: Skin is warm. No rashes, other new lesions, no LE edema Psychiatric: Pt behavior is normal without agitation  No other exam findings Lab Results  Component Value Date   WBC 11.7 (H) 08/24/2018   HGB 12.7 08/24/2018   HCT 38.4 08/24/2018   PLT 274.0 08/24/2018   GLUCOSE 201 (H) 08/24/2018   CHOL 181 08/24/2018   TRIG 119.0 08/24/2018   HDL 54.60 08/24/2018   LDLDIRECT 120.3 04/22/2012   LDLCALC 103 (H) 08/24/2018   ALT 17 08/24/2018   AST 11 08/24/2018   NA 139 08/24/2018   K 4.5 08/24/2018   CL 100  08/24/2018   CREATININE 1.72 (H) 08/24/2018   BUN 27 (H) 08/24/2018   CO2 28 08/24/2018   TSH 2.38 08/24/2018   INR 1.18 12/13/2010   HGBA1C 8.7 (H) 08/24/2018   MICROALBUR 14.5 (H) 08/24/2018       Assessment & Plan:

## 2018-09-07 NOTE — Assessment & Plan Note (Signed)
Mild to mod, for antibx course,  to f/u any worsening symptoms or concerns 

## 2018-09-30 ENCOUNTER — Other Ambulatory Visit: Payer: Self-pay | Admitting: Internal Medicine

## 2018-09-30 MED ORDER — GLIPIZIDE ER 10 MG PO TB24: ORAL_TABLET | ORAL | 0 refills | Status: DC

## 2018-09-30 NOTE — Telephone Encounter (Signed)
Requested Prescriptions  Pending Prescriptions Disp Refills  . glipiZIDE (GLUCOTROL XL) 10 MG 24 hr tablet 90 tablet 0    Sig: TAKE 1 TABLET(10 MG) BY MOUTH DAILY WITH BREAKFAST     Endocrinology:  Diabetes - Sulfonylureas Failed - 09/30/2018 11:17 AM      Failed - HBA1C is between 0 and 7.9 and within 180 days    Hgb A1c MFr Bld  Date Value Ref Range Status  08/24/2018 8.7 (H) 4.6 - 6.5 % Final    Comment:    Glycemic Control Guidelines for People with Diabetes:Non Diabetic:  <6%Goal of Therapy: <7%Additional Action Suggested:  >8%          Passed - Valid encounter within last 6 months    Recent Outpatient Visits          3 weeks ago Acute upper respiratory infection   [facility] -Clair Gulling, MD   1 month ago Encounter for preventative adult health care exam with abnormal findings   [facility] -Clair Gulling, MD   10 months ago Acute right-sided low back pain without sciatica   [facility] -Clair Gulling, MD   11 months ago Diabetic ulcer of right midfoot associated with type 2 diabetes mellitus, unspecified ulcer stage [facility])   [facility] -Celine Mans, Marye Round, MD   1 year ago Acute bronchitis, unspecified organism   [facility] -Shanna Cisco, Allyne Gee, FNP      Future Appointments            In 4 months [name], [name], MD [facility] -[place], [facility]

## 2018-10-05 ENCOUNTER — Ambulatory Visit: Payer: Managed Care, Other (non HMO) | Admitting: Podiatry

## 2018-10-05 ENCOUNTER — Encounter: Payer: Self-pay | Admitting: Podiatry

## 2018-10-05 ENCOUNTER — Other Ambulatory Visit: Payer: Self-pay

## 2018-10-05 VITALS — Temp 97.7°F

## 2018-10-05 DIAGNOSIS — M79675 Pain in left toe(s): Secondary | ICD-10-CM

## 2018-10-05 DIAGNOSIS — B351 Tinea unguium: Secondary | ICD-10-CM

## 2018-10-05 DIAGNOSIS — E1149 Type 2 diabetes mellitus with other diabetic neurological complication: Secondary | ICD-10-CM | POA: Diagnosis not present

## 2018-10-05 DIAGNOSIS — L97511 Non-pressure chronic ulcer of other part of right foot limited to breakdown of skin: Secondary | ICD-10-CM | POA: Diagnosis not present

## 2018-10-05 DIAGNOSIS — M216X9 Other acquired deformities of unspecified foot: Secondary | ICD-10-CM

## 2018-10-05 DIAGNOSIS — M14671 Charcot's joint, right ankle and foot: Secondary | ICD-10-CM | POA: Diagnosis not present

## 2018-10-05 DIAGNOSIS — M79674 Pain in right toe(s): Secondary | ICD-10-CM | POA: Diagnosis not present

## 2018-10-05 DIAGNOSIS — L03115 Cellulitis of right lower limb: Secondary | ICD-10-CM | POA: Diagnosis not present

## 2018-10-05 DIAGNOSIS — E0843 Diabetes mellitus due to underlying condition with diabetic autonomic (poly)neuropathy: Secondary | ICD-10-CM | POA: Diagnosis not present

## 2018-10-05 MED ORDER — DOXYCYCLINE HYCLATE 100 MG PO TABS: 100.0000 mg | ORAL_TABLET | Freq: Two times a day (BID) | ORAL | 0 refills | Status: DC

## 2018-10-07 NOTE — Addendum Note (Signed)
Addended by: Hadley Pen R on: 10/07/2018 03:18 PM   Modules accepted: Orders

## 2018-10-07 NOTE — Progress Notes (Signed)
Subjective: Pamela [name] presents to the office today for follow-up evaluation of a wound on the right foot.  She states that she was doing well but about 2 weeks ago she started to notice increased pain and some bloody drainage coming from the wound to the outside aspect of the right foot.  Denies any pus.  Denies any surrounding redness or red streaks.  She is also asking for her nails be trimmed today's are thickened elongated she cannot do them himself.  Denies any redness or drainage from the toenail sites. She denies any fevers, chills, nausea, vomiting.  No calf pain, chest pain, shortness of breath.  Denies any systemic complaints such as fevers, chills, nausea, vomiting. No acute changes since last appointment, and no other complaints at this time.   Objective: AAO x3, NAD DP/PT pulses palpable bilaterally, CRT less than 3 seconds Hyperkeratotic lesion present on the fifth metatarsal base on the right foot on the lateral aspect.  This does appear to be much thicker today than what it has been.  Upon debridement of this area the underlying skin was macerated and was pre-ulcerative and only a very small superficial area of skin breakdown.  There is no drainage or pus identified today.  There is a faint rim of erythema but I think this is more from inflammation as opposed to infection.  There is no fluctuation or crepitation or any malodor.  No ascending cellulitis. Nails are hypertrophic, dystrophic, brittle, discolored, elongated 10. No surrounding redness or drainage. Tenderness nails 1-5 bilaterally. No open lesions or pre-ulcerative lesions are identified today. No pain with calf compression, swelling, warmth, erythema  Assessment: Ulceration right lateral foot with localized erythema; symptomatic onychomycosis   Plan: -All treatment options discussed with the patient including all alternatives, risks, complications.  -Nails debrided x10 without any complications or bleeding. -I sharply  debrided the hyperkeratotic lesion on the lateral aspect the right fifth metatarsal base without any complications utilizing #312 with scalpel.  I did send the wound for pathology.  As this is becoming a reoccurring issue and today it appeared to be much thicker this was sent to pathology for evaluation to rule out any other underlying pathology.  Recommended Betadine wet-to-dry overlying the area daily given the small meta macerated tissue.  Doxycycline given the localized erythema.  Monitoring signs or symptoms of worsening infection. -We discussed further offloading of the area.  Given her foot type she is putting a lot of weight to the outside aspect of her foot causing the wound.  I had [name] see her today.  I do not think that an orthotic may be helpful but we agreed to try a brace to try to aggressively offload this area.  I will check insurance coverage.  Also discussed surgical intervention if needed.  Vivi Barrack DPM

## 2018-10-16 ENCOUNTER — Other Ambulatory Visit (HOSPITAL_COMMUNITY): Payer: Self-pay | Admitting: Cardiology

## 2018-10-19 ENCOUNTER — Other Ambulatory Visit: Payer: Self-pay

## 2018-10-19 ENCOUNTER — Ambulatory Visit: Payer: Managed Care, Other (non HMO) | Admitting: Orthotics

## 2018-10-19 ENCOUNTER — Ambulatory Visit (INDEPENDENT_AMBULATORY_CARE_PROVIDER_SITE_OTHER): Payer: Managed Care, Other (non HMO)

## 2018-10-19 ENCOUNTER — Ambulatory Visit: Payer: Managed Care, Other (non HMO) | Admitting: Podiatry

## 2018-10-19 VITALS — Temp 97.2°F

## 2018-10-19 DIAGNOSIS — M14671 Charcot's joint, right ankle and foot: Secondary | ICD-10-CM

## 2018-10-19 DIAGNOSIS — M216X9 Other acquired deformities of unspecified foot: Secondary | ICD-10-CM | POA: Diagnosis not present

## 2018-10-19 DIAGNOSIS — L03115 Cellulitis of right lower limb: Secondary | ICD-10-CM

## 2018-10-19 DIAGNOSIS — L97511 Non-pressure chronic ulcer of other part of right foot limited to breakdown of skin: Secondary | ICD-10-CM

## 2018-10-19 DIAGNOSIS — S90222A Contusion of left lesser toe(s) with damage to nail, initial encounter: Secondary | ICD-10-CM

## 2018-10-19 DIAGNOSIS — E1149 Type 2 diabetes mellitus with other diabetic neurological complication: Secondary | ICD-10-CM

## 2018-10-19 NOTE — Progress Notes (Signed)
Open wound needs to be treated b/4 casting for custom shoes.

## 2018-10-22 ENCOUNTER — Telehealth: Payer: Self-pay | Admitting: *Deleted

## 2018-10-22 LAB — WOUND CULTURE
MICRO NUMBER:: 466646
RESULT:: NO GROWTH
SPECIMEN QUALITY:: ADEQUATE

## 2018-10-22 NOTE — Telephone Encounter (Signed)
Called and left a message for the patient stating the culture came back positive for the wound and also possible wart and Per Dr Ardelle Anton clinically there were no signs of a wart and would like for you to continue the wound treatment and to call the [place] office at [phone] if any concerns or questions. Misty Stanley

## 2018-10-22 NOTE — Telephone Encounter (Signed)
-----   Message from Vivi Barrack, DPM sent at 10/21/2018  5:10 PM EDT ----- Misty Stanley- please let her know the biopsy showed + wound, possible wart however clinically no signs of wart. Continue current treatment.

## 2018-10-24 NOTE — Progress Notes (Signed)
Subjective: Ms. Pamela [name] presents to the office today for follow-up evaluation of a wound on the right foot.  She states that she is still taking antibiotics and she states that she still having discomfort at times which is intermittent.  She denies any increase in swelling or redness or any drainage or pus coming from the wound to the right foot.  She also has secondary concerns of a dark area on the left big toenail which is been on for last 2 weeks.  She denies any drainage or pus in the toenail site denies any recent injury.  She denies any fevers, chills, nausea, vomiting.  No calf pain, chest pain, shortness of breath.  She has no other concerns today.  Objective: AAO x3, NAD DP/PT pulses palpable bilaterally, CRT less than 3 seconds There is thick hyperkeratotic tissue to the fifth metatarsal base on the right foot.  Upon debridement superficial area of skin breakdown.  There is localized edema to this area.  Upon debridement there is a small opening but there is no probing to bone, undermining or tunneling.  I took a wound culture of this today but there is no purulence.  There is no surrounding erythema, ascending cellulitis.  There is no fluctuation crepitation or any malodor.  No ascending cellulitis. Along the base of the left hallux toenail is a small amount of dried blood from the nail.  There is no extension of any hyperpigmentation into the surrounding skin.  There is no edema, erythema to the toenail site there is no pain to the nail. No pain with calf compression, swelling, warmth, erythema  Assessment: Chronic wound right fifth metatarsal base, subungual hematoma left hallux  Plan: -All treatment options discussed with the patient including all alternatives, risks, complications.  -Small moderate blood underneath the left hallux toenail no signs of infection.  We will continue to monitor. -I debrided the hyperkeratotic tissue but no bleeding utilizing a #312 with scalpel down to  healthy, granular tissue.  Small opening was present today and I took a wound culture today.  Finish course of antibiotics.  Continue offloading at all times.  Can discuss surgical intervention.  Or once the area heals then we need to do a good offloading shoe to this area.  I want her to go back in the cam boot as well.  Monitor for any signs or symptoms of infection.  Return in about 10 days (around 10/29/2018).  Vivi Barrack DPM

## 2018-10-25 ENCOUNTER — Telehealth: Payer: Self-pay | Admitting: *Deleted

## 2018-10-25 NOTE — Telephone Encounter (Signed)
Pt states she had an appt last Tuesday with Dr. Ardelle Anton and he was to send her pads to help her foot heal, and as of yet they have not been received.

## 2018-10-27 ENCOUNTER — Telehealth: Payer: Self-pay | Admitting: Internal Medicine

## 2018-10-27 NOTE — Telephone Encounter (Signed)
Copied from CRM [id number]. Topic: Quick Communication - Rx Refill/Question >> Oct 27, 2018  3:51 PM Baldo Daub L wrote: Medication: metFORMIN (GLUCOPHAGE) 1000 MG tablet  Has the patient contacted their pharmacy? Yes - states pharmacy hasn't heard a response from [place].  Pt will be completely out on Friday. (Agent: If no, request that the patient contact the pharmacy for the refill.) (Agent: If yes, when and what did the pharmacy advise?)  Preferred Pharmacy (with phone number or street name): [id number] [place], [place] - [address] AT [facility] [address] [facility] [address] [facility] [address] [phone] (Phone) [phone] (Fax)  Agent: Please be advised that RX refills may take up to 3 business days. We ask that you follow-up with your pharmacy.

## 2018-10-28 MED ORDER — METFORMIN HCL 1000 MG PO TABS: ORAL_TABLET | ORAL | 1 refills | Status: DC

## 2018-11-02 ENCOUNTER — Encounter: Payer: Self-pay | Admitting: Podiatry

## 2018-11-02 ENCOUNTER — Other Ambulatory Visit: Payer: Self-pay

## 2018-11-02 ENCOUNTER — Ambulatory Visit (INDEPENDENT_AMBULATORY_CARE_PROVIDER_SITE_OTHER): Payer: Managed Care, Other (non HMO) | Admitting: Orthotics

## 2018-11-02 ENCOUNTER — Ambulatory Visit: Payer: Managed Care, Other (non HMO) | Admitting: Podiatry

## 2018-11-02 VITALS — Temp 96.8°F

## 2018-11-02 DIAGNOSIS — E1149 Type 2 diabetes mellitus with other diabetic neurological complication: Secondary | ICD-10-CM

## 2018-11-02 DIAGNOSIS — L97511 Non-pressure chronic ulcer of other part of right foot limited to breakdown of skin: Secondary | ICD-10-CM | POA: Diagnosis not present

## 2018-11-02 DIAGNOSIS — M216X9 Other acquired deformities of unspecified foot: Secondary | ICD-10-CM

## 2018-11-02 DIAGNOSIS — L03115 Cellulitis of right lower limb: Secondary | ICD-10-CM

## 2018-11-02 MED ORDER — MUPIROCIN 2 % EX OINT: 1.0000 "application " | TOPICAL_OINTMENT | Freq: Two times a day (BID) | CUTANEOUS | 2 refills | Status: DC

## 2018-11-02 NOTE — Progress Notes (Signed)
Cast/scanned patient today for a mezzo afo brace with a deep pocket, and offload for a chronic ucler lateral aspect of foot.

## 2018-11-02 NOTE — Progress Notes (Signed)
Subjective: 63 year old female presents the office today for follow-up evaluation of her right foot.  She states that she gets intermittent bleeding but no other drainage or pus.  She states has not been hurting as much either.  She thinks that this also could be because she started Lyrica about a month ago and has been helping her foot pain as well.  She denies any increase in redness or swelling or any drainage or pus coming from the wound area. She also presents today to see [name]. Denies any systemic complaints such as fevers, chills, nausea, vomiting. No acute changes since last appointment, and no other complaints at this time.   Objective: AAO x3, NAD DP/PT pulses palpable bilaterally, CRT less than 3 seconds On the lateral aspect of the right foot on the fifth metatarsal base is hyperkeratotic tissue.  Upon debridement appears an ongoing wound is healed except for 1 small superficial area on the central aspect.  There is decreased edema to the area.  There is no fluctuation crepitation or any malodor.  No ascending cellulitis. Subungual hematoma left hallux toenail stable without any signs of infection No open lesions or pre-ulcerative lesions.  No pain with calf compression, swelling, warmth, erythema  Assessment: Right chronic ulceration  Plan: -All treatment options discussed with the patient including all alternatives, risks, complications.  -Patient debrided the hyperkeratotic tissue to reveal the underlying wound without any complications or bleeding.  We will continue antibiotic ointment dressing changes daily as she has been doing this at home and this is been helpful.  Today Rick evaluated her and she was measured for a Mezzo brace to help create a pocket to offload the wound. -Continue CAM boot.  She states this is been helpful to eliminate the pressure.  She presents today wearing her regular sandal. -Monitor for any clinical signs or symptoms of infection and directed to call the  office immediately should any occur or go to the ER. -Patient encouraged to call the office with any questions, concerns, change in symptoms.   Pamela [name] DPM

## 2018-11-03 ENCOUNTER — Other Ambulatory Visit: Payer: Self-pay | Admitting: Podiatry

## 2018-11-03 NOTE — Telephone Encounter (Signed)
Patient had an appointment yesterday and was seen by Dr Ardelle Anton and also [name] who measured patient as well. Pamela [name]

## 2018-11-06 ENCOUNTER — Other Ambulatory Visit (HOSPITAL_COMMUNITY): Payer: Self-pay | Admitting: Cardiology

## 2018-11-16 ENCOUNTER — Other Ambulatory Visit (HOSPITAL_COMMUNITY): Payer: Self-pay

## 2018-11-16 ENCOUNTER — Encounter: Payer: Self-pay | Admitting: Podiatry

## 2018-11-16 ENCOUNTER — Other Ambulatory Visit: Payer: Self-pay

## 2018-11-16 ENCOUNTER — Ambulatory Visit: Payer: Managed Care, Other (non HMO) | Admitting: Podiatry

## 2018-11-16 VITALS — Temp 97.2°F

## 2018-11-16 DIAGNOSIS — L97511 Non-pressure chronic ulcer of other part of right foot limited to breakdown of skin: Secondary | ICD-10-CM | POA: Diagnosis not present

## 2018-11-16 DIAGNOSIS — E1149 Type 2 diabetes mellitus with other diabetic neurological complication: Secondary | ICD-10-CM | POA: Diagnosis not present

## 2018-11-16 DIAGNOSIS — S90222D Contusion of left lesser toe(s) with damage to nail, subsequent encounter: Secondary | ICD-10-CM

## 2018-11-16 MED ORDER — POTASSIUM CHLORIDE CRYS ER 20 MEQ PO TBCR: 20.0000 meq | EXTENDED_RELEASE_TABLET | Freq: Two times a day (BID) | ORAL | 6 refills | Status: DC

## 2018-11-21 NOTE — Progress Notes (Signed)
Subjective: 63 year old female presents the office today for follow-up evaluation of her right foot.  She states that overall she is doing well.  She has been wearing the cam boot more which is been helpful and she feels this does take a lot of pressure off the area.  She is not had any significant drainage.  She is on Lyrica for the neuropathy and that seems to be helping with her pain a lot.  Denies any increase in redness or swelling or any red streaks.  We are awaiting the brace for offloading.No acute changes since last appointment, and no other complaints at this time.   Objective: AAO x3, NAD DP/PT pulses palpable bilaterally, CRT less than 3 seconds On the lateral aspect of the right foot on the fifth metatarsal base is hyperkeratotic tissue.  Upon debridement of this tissue there is no definitive underlying ulceration identified today there is no drainage or pus.  No surrounding erythema, edema, and.  No fluctuation crepitation. The area of blood under the left hallux toenail appears to be doing well.  No signs of infection of the nail. No open lesions or pre-ulcerative lesions.  No pain with calf compression, swelling, warmth, erythema  Assessment: Right chronic ulceration-pre-ulcerative callus  Plan: -All treatment options discussed with the patient including all alternatives, risks, complications.  -Patient debrided the hyperkeratotic tissue without any complications.  Wound appears to be healed however I do want her to keep a bandage on the area daily.  Continue cam boot for offloading.  We are awaiting the brace with offloading. -Monitor for any clinical signs or symptoms of infection and directed to call the office immediately should any occur or go to the ER. -Patient encouraged to call the office with any questions, concerns, change in symptoms.   Trula Slade DPM

## 2018-11-30 ENCOUNTER — Ambulatory Visit: Payer: Managed Care, Other (non HMO) | Admitting: Podiatry

## 2018-11-30 ENCOUNTER — Encounter: Payer: Self-pay | Admitting: Podiatry

## 2018-11-30 ENCOUNTER — Other Ambulatory Visit: Payer: Self-pay

## 2018-11-30 VITALS — Temp 97.1°F

## 2018-11-30 DIAGNOSIS — L97511 Non-pressure chronic ulcer of other part of right foot limited to breakdown of skin: Secondary | ICD-10-CM

## 2018-11-30 DIAGNOSIS — E1149 Type 2 diabetes mellitus with other diabetic neurological complication: Secondary | ICD-10-CM

## 2018-11-30 NOTE — Progress Notes (Signed)
Subjective: 63 year old female presents the office today for follow-up evaluation of her right foot.  She states that overall she is doing well.  She has not had any drainage to the area no pain.  No swelling.  She does wear the boot at home.  She also said the Lyrica has been helpful. No acute changes since last appointment, and no other complaints at this time.   Objective: AAO x3, NAD DP/PT pulses palpable bilaterally, CRT less than 3 seconds On the lateral aspect of the right foot on the fifth metatarsal base is hyperkeratotic tissue.  Upon debridement of this area there is no ongoing ulceration identified today and there is no drainage or pus.  No surrounding erythema, ascending cellulitis.  No fluctuation crepitation any malodor.  No open lesions or pre-ulcerative lesions.  No pain with calf compression, swelling, warmth, erythema  Assessment: Right chronic ulceration-pre-ulcerative callus  Plan: -All treatment options discussed with the patient including all alternatives, risks, complications.  -Sharply debrided the hyperkeratotic tissue without any complications.  The wound is still healed.  Continue with offloading.  Do recommend he continue the cam boot.  We are still awaiting the brace to help offload the lesion to help prevent reoccurrence.   -Monitor for any clinical signs or symptoms of infection and directed to call the office immediately should any occur or go to the ER.  RTC 3 weeks with me but will see Liliane Channel next week for the brace  Trula Slade DPM

## 2018-12-09 ENCOUNTER — Other Ambulatory Visit: Payer: Self-pay

## 2018-12-09 ENCOUNTER — Ambulatory Visit (INDEPENDENT_AMBULATORY_CARE_PROVIDER_SITE_OTHER): Payer: Managed Care, Other (non HMO) | Admitting: Orthotics

## 2018-12-09 DIAGNOSIS — L97511 Non-pressure chronic ulcer of other part of right foot limited to breakdown of skin: Secondary | ICD-10-CM

## 2018-12-09 DIAGNOSIS — E11610 Type 2 diabetes mellitus with diabetic neuropathic arthropathy: Secondary | ICD-10-CM

## 2018-12-09 DIAGNOSIS — S90222D Contusion of left lesser toe(s) with damage to nail, subsequent encounter: Secondary | ICD-10-CM

## 2018-12-09 DIAGNOSIS — M14671 Charcot's joint, right ankle and foot: Secondary | ICD-10-CM

## 2018-12-09 DIAGNOSIS — L03115 Cellulitis of right lower limb: Secondary | ICD-10-CM

## 2018-12-09 NOTE — Progress Notes (Signed)
Patient came in today to pick up standard MEZZO brace to offload lateral DB ulcer.   Patient was evaluated for fit and function.   The brace fit very well and there were any complaints of the way it felt once donned.  The brace offered ankle stability in both saggital and coroneal planes. Plastizote was added in the offload area to further benefit the patient.  Patient advised to always wear proper fitting shoes with brace.

## 2018-12-23 ENCOUNTER — Ambulatory Visit: Payer: Managed Care, Other (non HMO) | Admitting: Podiatry

## 2018-12-23 ENCOUNTER — Encounter: Payer: Self-pay | Admitting: Podiatry

## 2018-12-23 ENCOUNTER — Telehealth: Payer: Self-pay | Admitting: Internal Medicine

## 2018-12-23 ENCOUNTER — Other Ambulatory Visit: Payer: Self-pay

## 2018-12-23 VITALS — Temp 98.2°F

## 2018-12-23 DIAGNOSIS — M79674 Pain in right toe(s): Secondary | ICD-10-CM | POA: Diagnosis not present

## 2018-12-23 DIAGNOSIS — B351 Tinea unguium: Secondary | ICD-10-CM

## 2018-12-23 DIAGNOSIS — E1149 Type 2 diabetes mellitus with other diabetic neurological complication: Secondary | ICD-10-CM | POA: Diagnosis not present

## 2018-12-23 DIAGNOSIS — M79675 Pain in left toe(s): Secondary | ICD-10-CM

## 2018-12-23 DIAGNOSIS — Q828 Other specified congenital malformations of skin: Secondary | ICD-10-CM | POA: Diagnosis not present

## 2018-12-23 DIAGNOSIS — E11610 Type 2 diabetes mellitus with diabetic neuropathic arthropathy: Secondary | ICD-10-CM

## 2018-12-23 MED ORDER — ATORVASTATIN CALCIUM 10 MG PO TABS: 10.0000 mg | ORAL_TABLET | Freq: Every day | ORAL | 1 refills | Status: DC

## 2018-12-23 NOTE — Telephone Encounter (Signed)
Medication Refill - Medication:  atorvastatin (LIPITOR) 10 MG tablet  Has the patient contacted their pharmacy?  Patient stated she is stating she sent in the request Sunday. Please advise patient is out of medication.   Preferred Pharmacy (with phone number or street name):  [facility] [place], [address] AT [address] [place] [phone] (Phone) [phone] (Fax)   Agent: Please be advised that RX refills may take up to 3 business days. We ask that you follow-up with your pharmacy.

## 2018-12-23 NOTE — Progress Notes (Signed)
Subjective: 62 year old female presents the office today for follow-up evaluation of her right foot.  She states the brace is helping and she is getting used to wearing it.  She denies any new skin irritation since wearing the brace.  She denies any drainage from the wound.  She has no other concerns today.  No acute changes since last appointment, and no other complaints at this time.   Objective: AAO x3, NAD DP/PT pulses palpable bilaterally, CRT less than 3 seconds On the lateral aspect of the right foot on the fifth metatarsal base is hyperkeratotic tissue.  There is no hyperkeratotic tissue today no open ulcers much better.  There is no edema.  There is mild erythema more irritation is no increase in warmth or ascending cellulitis or drainage or pus or any signs of infection otherwise.  The nails appear to be hypertrophic, dystrophic, discolored, elongated x10.  The subungual hematoma left hallux with growing out.  Redness or drainage to the toenail sites.  Nails cause irritation with shoes.  Upon debridement of this area there is no ongoing ulceration identified today and there is no drainage or pus.  No surrounding erythema, ascending cellulitis.  No fluctuation crepitation any malodor.  No open lesions or pre-ulcerative lesions.  No pain with calf compression, swelling, warmth, erythema  Assessment: Right chronic ulceration-pre-ulcerative callus; symptomatic onychomycosis   Plan: -All treatment options discussed with the patient including all alternatives, risks, complications.  -The brace appears to be fitting well there is no new skin irritation.  There is minimal hyperkeratotic tissue which is debrided without any complications or bleeding.  Also do the nails x10 without any complications or bleeding.  Discussed daily foot inspection.   Trula Slade DPM

## 2018-12-29 ENCOUNTER — Telehealth: Payer: Self-pay | Admitting: Internal Medicine

## 2018-12-29 MED ORDER — GLIPIZIDE ER 10 MG PO TB24: ORAL_TABLET | ORAL | 0 refills | Status: DC

## 2018-12-29 NOTE — Telephone Encounter (Signed)
Patient would like a refill on her glipiZIDE (GLUCOTROL XL) 10 MG 24 hr tablet  medication and have it sent to her preferred pharmacy [facility] on [address].

## 2019-01-11 ENCOUNTER — Other Ambulatory Visit: Payer: Self-pay

## 2019-01-11 ENCOUNTER — Telehealth (HOSPITAL_COMMUNITY): Payer: Self-pay | Admitting: Cardiology

## 2019-01-11 ENCOUNTER — Encounter (HOSPITAL_COMMUNITY): Payer: Self-pay | Admitting: Cardiology

## 2019-01-11 ENCOUNTER — Ambulatory Visit (HOSPITAL_COMMUNITY)
Admission: RE | Admit: 2019-01-11 | Discharge: 2019-01-11 | Disposition: A | Discharge status: Home / Self Care | Payer: Managed Care, Other (non HMO) | Source: Ambulatory Visit | Attending: Cardiology | Admitting: Cardiology

## 2019-01-11 VITALS — BP 135/65 | HR 100 | Wt 358.0 lb

## 2019-01-11 DIAGNOSIS — Z7901 Long term (current) use of anticoagulants: Secondary | ICD-10-CM | POA: Insufficient documentation

## 2019-01-11 DIAGNOSIS — E119 Type 2 diabetes mellitus without complications: Secondary | ICD-10-CM | POA: Insufficient documentation

## 2019-01-11 DIAGNOSIS — Z6841 Body Mass Index (BMI) 40.0 and over, adult: Secondary | ICD-10-CM | POA: Insufficient documentation

## 2019-01-11 DIAGNOSIS — I482 Chronic atrial fibrillation, unspecified: Secondary | ICD-10-CM | POA: Diagnosis not present

## 2019-01-11 DIAGNOSIS — E785 Hyperlipidemia, unspecified: Secondary | ICD-10-CM | POA: Insufficient documentation

## 2019-01-11 DIAGNOSIS — Z79899 Other long term (current) drug therapy: Secondary | ICD-10-CM | POA: Insufficient documentation

## 2019-01-11 DIAGNOSIS — E669 Obesity, unspecified: Secondary | ICD-10-CM | POA: Insufficient documentation

## 2019-01-11 DIAGNOSIS — I5032 Chronic diastolic (congestive) heart failure: Secondary | ICD-10-CM

## 2019-01-11 DIAGNOSIS — I110 Hypertensive heart disease with heart failure: Secondary | ICD-10-CM | POA: Insufficient documentation

## 2019-01-11 DIAGNOSIS — Z7984 Long term (current) use of oral hypoglycemic drugs: Secondary | ICD-10-CM | POA: Diagnosis not present

## 2019-01-11 LAB — CBC
HCT: 40.4 % (ref 36.0–46.0)
Hemoglobin: 12.4 g/dL (ref 12.0–15.0)
MCH: 26.3 pg (ref 26.0–34.0)
MCHC: 30.7 g/dL (ref 30.0–36.0)
MCV: 85.8 fL (ref 80.0–100.0)
Platelets: 237 10*3/uL (ref 150–400)
RBC: 4.71 MIL/uL (ref 3.87–5.11)
RDW: 15 % (ref 11.5–15.5)
WBC: 10.5 10*3/uL (ref 4.0–10.5)
nRBC: 0 % (ref 0.0–0.2)

## 2019-01-11 LAB — BASIC METABOLIC PANEL
Anion gap: 12 (ref 5–15)
BUN: 42 mg/dL — ABNORMAL HIGH (ref 8–23)
CO2: 20 mmol/L — ABNORMAL LOW (ref 22–32)
Calcium: 9.4 mg/dL (ref 8.9–10.3)
Chloride: 105 mmol/L (ref 98–111)
Creatinine, Ser: 1.95 mg/dL — ABNORMAL HIGH (ref 0.44–1.00)
GFR calc Af Amer: 31 mL/min — ABNORMAL LOW (ref >60)
GFR calc non Af Amer: 27 mL/min — ABNORMAL LOW (ref >60)
Glucose, Bld: 238 mg/dL — ABNORMAL HIGH (ref 70–99)
Potassium: 4.3 mmol/L (ref 3.5–5.1)
Sodium: 137 mmol/L (ref 135–145)

## 2019-01-11 MED ORDER — ATORVASTATIN CALCIUM 20 MG PO TABS: 20.0000 mg | ORAL_TABLET | Freq: Every day | ORAL | 3 refills | Status: DC

## 2019-01-11 MED ORDER — FUROSEMIDE 40 MG PO TABS: 40.0000 mg | ORAL_TABLET | Freq: Every day | ORAL | 3 refills | Status: DC

## 2019-01-11 NOTE — Patient Instructions (Signed)
Labs were done today. We will call you with any ABNORMAL results. No news is good news!  Please come in for repeat labs in 2 months.  EKG was completed.   INCREASE Atorvastatin to 20 mg (1 tab) daily.   Your physician wants you to follow-up in: 6 months. You will receive a reminder letter in the mail two months in advance. If you don't receive a letter, please call our office to schedule the follow-up appointment.  At the [facility], you and your health needs are our priority. As part of our continuing mission to provide you with exceptional heart care, we have created designated Provider Care Teams. These Care Teams include your primary Cardiologist (physician) and Advanced Practice Providers (APPs- Physician Assistants and Nurse Practitioners) who all work together to provide you with the care you need, when you need it.   You may see any of the following providers on your designated Care Team at your next follow up: Marland Kitchen Dr Glori Bickers . Dr Loralie Champagne . Darrick Grinder, NP   Please be sure to bring in all your medications bottles to every appointment.

## 2019-01-11 NOTE — Progress Notes (Signed)
Patient ID: Pamela [name], female   DOB: 1956/05/07, 63 y.o.   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   She underwent cardiac cath in April 2012 which showed normal coronaries. Did very well with rate control and diuresis. Started on coumadin. We saw her in May and switched her to Xarelto due to problems regulating INRs. She underwent DC-CV in June 2012 which failed so we opted for rate control strategy.  Echo was done in 3/19, showing EF 60-65% with mild LVH.   She returns for followup of atrial fibrillation and diastolic CHF.  She remains in atrial fibrillation, rate controlled.  She has gained 17 lbs since last visit but has been working from home and eating more/exercising less.  She does not feel like she is volume overloaded.  Stable dyspnea walking up stairs or hills.  No chest pain.  No orthopnea/PND.  No BRBPR/melena.  She has some fatigue but says that she does not snore.  Does not want sleep study.   She has a right Charcot foot with ongoing foot pain, followed by podiatry.   Labs (6/14): Creatinine 1.3, Potassium 4.4, LDL 75, HDL 56, K 4.4, creatinine 1.3 Labs (3/15): K 4, creatinine 1.06, BNP 741 Labs (4/16): hgb 14.7 Labs (11/16): K 3.8, creatinine 1.25, LDL 83, HDL 47 Labs (4/17): K 4, creatinine 1.49, LDL 63, HDL 43 Labs (9/17): K 3.6, creatinine 1.29, LDL 49, HDL 49 Labs (8/18): TSH normal, Hgb 12.9, K 3.7, creatinine 1.29 Labs (3/19): LDL 53, HDL 43, hgb 12.1, K 4.1, creatinine 1.46 Labs (10/19): K 3.9, creatinine 1.71 Labs (8/20): K 4.3, BUN 42, creatinine 1.95  ECG (personally reviewed): atrial fibrillation with poor RWP  ABIs (10/19): Normal.   ROS: All systems negative except as listed in HPI, PMH and Problem List.  Past Medical History:  Diagnosis Date  . ALLERGIC RHINITIS 12/31/2007  . ANXIETY 12/31/2007  . Atrial fibrillation ([place])   . DEPRESSION  12/31/2007  . DIABETES MELLITUS, TYPE II 12/31/2007  . Diastolic dysfunction 0/06/930  . Hyperlipidemia 04/23/2012  . HYPERTENSION 12/31/2007  . Migraine   . PNA (pneumonia)    in her 32s  . Right knee DJD   . Rosacea 10/11/2010    Current Outpatient Medications  Medication Sig Dispense Refill  . albuterol (PROVENTIL HFA;VENTOLIN HFA) 108 (90 Base) MCG/ACT inhaler Inhale 2 puffs into the lungs every 6 (six) hours as needed for wheezing or shortness of breath. 1 Inhaler 0  . atorvastatin (LIPITOR) 20 MG tablet Take 1 tablet (20 mg total) by mouth daily. 90 tablet 3  . cetirizine (ZYRTEC) 10 MG tablet TAKE 1 TABLET(10 MG) BY MOUTH DAILY 90 tablet 3  . cyclobenzaprine (FLEXERIL) 5 MG tablet Take 1 tablet (5 mg total) by mouth 3 (three) times daily as needed for muscle spasms. 60 tablet 1  . diltiazem (CARDIZEM CD) 360 MG 24 hr capsule TAKE 1 CAPSULE(360 MG) BY MOUTH DAILY 30 capsule 11  . empagliflozin (JARDIANCE) 25 MG TABS tablet Take 25 mg by mouth daily. 90 tablet 3  . furosemide (LASIX) 40 MG tablet Take 1 tablet (40 mg total) by mouth 2 (two) times daily. May also take 0.5 tablets (20 mg total) as needed. 90 tablet 6  . glipiZIDE (GLUCOTROL XL) 10 MG 24 hr tablet TAKE 1 TABLET(10 MG) BY MOUTH DAILY WITH BREAKFAST 90 tablet 0  .  losartan (COZAAR) 50 MG tablet TAKE 1 TABLET(50 MG) BY MOUTH DAILY 30 tablet 3  . metFORMIN (GLUCOPHAGE) 1000 MG tablet TAKE 1 TABLET(1000 MG) BY MOUTH TWICE DAILY WITH A MEAL 180 tablet 1  . metoprolol succinate (TOPROL-XL) 100 MG 24 hr tablet Take 1 tablet (100 mg total) by mouth 2 (two) times daily. 180 tablet 3  . mometasone (ELOCON) 0.1 % cream Apply 1 application topically 2 (two) times daily. 45 g 0  . mupirocin ointment (BACTROBAN) 2 % APPLY EXTERNALLY TO THE AFFECTED AREA TWICE DAILY 22 g 1  . Omega-3 Fatty Acids (FISH OIL) 1000 MG CAPS Take 2 capsules (2,000 mg total) by mouth 2 (two) times daily. 60 capsule 0  . potassium chloride SA (K-DUR) 20 MEQ tablet  Take 1 tablet (20 mEq total) by mouth 2 (two) times daily. 60 tablet 6  . pregabalin (LYRICA) 50 MG capsule Take 1 capsule (50 mg total) by mouth 3 (three) times daily. 90 capsule 5  . spironolactone (ALDACTONE) 25 MG tablet TAKE 1 TABLET(25 MG) BY MOUTH DAILY 90 tablet 0  . traMADol (ULTRAM) 50 MG tablet Take 1 tablet (50 mg total) by mouth every 6 (six) hours as needed. 30 tablet 0  . XARELTO 20 MG TABS tablet TAKE 1 TABLET BY MOUTH EVERY DAY WITH SUPPER 30 tablet 2  . fluconazole (DIFLUCAN) 150 MG tablet      No current facility-administered medications for this encounter.    PHYSICAL EXAM: Vitals:   01/11/19 0845  BP: 135/65  Pulse: 100  SpO2: 99%  Weight: (!) 162.4 kg (358 lb)   General: NAD, obese Neck: Thick, no JVD, no thyromegaly or thyroid nodule.  Lungs: Clear to auscultation bilaterally with normal respiratory effort. CV: Nondisplaced PMI.  Heart irregular S1/S2, no S3/S4, no murmur.  No peripheral edema.  No carotid bruit.  Normal pedal pulses.  Abdomen: Soft, nontender, no hepatosplenomegaly, no distention.  Skin: Intact without lesions or rashes.  Neurologic: Alert and oriented x 3.  Psych: Normal affect. Extremities: No clubbing or cyanosis.  HEENT: Normal.   ASSESSMENT & PLAN: 1. Atrial fibrillation: Chronic.  Stable rate control.   - Continue Xarelto, diltiazem CD, Toprol XL. 2. Chronic diastolic CHF: NYHA class II symptoms.  Her weight is up, but think this is due to diet/lack of exercise rather than volume overload.  BUN/creatinine actually higher today.  - Hold Lasix for a day and decrease to 40 mg daily.  BMET 10 days.   3. HTN: BP controlled.   4. OSA: Body habitus/fatigue suggest OSA but she does not want to do even a home sleep study.  5. Obesity: This is getting worse now that she is working at home.  We discussed dietary changes/increased exercise.  6. Hyperlipidemia: LDL ideally < 70 with diabetes, will increase atorvastatin to 20 mg daily with  lipids/LFTs in 2 months.   Followup in 6 months.    Marca AnconaDalton Worth Kober 01/11/2019

## 2019-01-11 NOTE — Telephone Encounter (Signed)
Notes recorded by Kerry Dory, CMA on 01/11/2019 at 2:06 PM EDT  Patient aware.  [phone] (M)  Repeat labs 8/13  ------   Notes recorded by Larey Dresser, MD on 01/11/2019 at 1:23 PM EDT  Hold Lasix for a day, then decrease to 40 mg once daily (rather than twice daily). BMET in 10 days.

## 2019-01-15 ENCOUNTER — Other Ambulatory Visit (HOSPITAL_COMMUNITY): Payer: Self-pay | Admitting: Cardiology

## 2019-01-20 ENCOUNTER — Other Ambulatory Visit: Payer: Self-pay

## 2019-01-20 ENCOUNTER — Ambulatory Visit (HOSPITAL_COMMUNITY)
Admission: RE | Admit: 2019-01-20 | Discharge: 2019-01-20 | Disposition: A | Discharge status: Home / Self Care | Payer: Managed Care, Other (non HMO) | Source: Ambulatory Visit | Attending: Internal Medicine | Admitting: Internal Medicine

## 2019-01-20 DIAGNOSIS — I5032 Chronic diastolic (congestive) heart failure: Secondary | ICD-10-CM | POA: Insufficient documentation

## 2019-01-20 LAB — LIPID PANEL
Cholesterol: 145 mg/dL (ref 0–200)
HDL: 56 mg/dL (ref >40)
LDL Cholesterol: 70 mg/dL (ref 0–99)
Total CHOL/HDL Ratio: 2.6 RATIO
Triglycerides: 97 mg/dL (ref <150)
VLDL: 19 mg/dL (ref 0–40)

## 2019-01-20 LAB — BASIC METABOLIC PANEL
Anion gap: 14 (ref 5–15)
BUN: 34 mg/dL — ABNORMAL HIGH (ref 8–23)
CO2: 20 mmol/L — ABNORMAL LOW (ref 22–32)
Calcium: 9.4 mg/dL (ref 8.9–10.3)
Chloride: 105 mmol/L (ref 98–111)
Creatinine, Ser: 1.97 mg/dL — ABNORMAL HIGH (ref 0.44–1.00)
GFR calc Af Amer: 31 mL/min — ABNORMAL LOW (ref >60)
GFR calc non Af Amer: 27 mL/min — ABNORMAL LOW (ref >60)
Glucose, Bld: 205 mg/dL — ABNORMAL HIGH (ref 70–99)
Potassium: 4.2 mmol/L (ref 3.5–5.1)
Sodium: 139 mmol/L (ref 135–145)

## 2019-01-20 LAB — HEPATIC FUNCTION PANEL
ALT: 17 U/L (ref 0–44)
AST: 15 U/L (ref 15–41)
Albumin: 3.6 g/dL (ref 3.5–5.0)
Alkaline Phosphatase: 47 U/L (ref 38–126)
Bilirubin, Direct: <0.1 mg/dL (ref 0.0–0.2)
Total Bilirubin: 0.4 mg/dL (ref 0.3–1.2)
Total Protein: 7.3 g/dL (ref 6.5–8.1)

## 2019-02-03 ENCOUNTER — Encounter: Payer: Self-pay | Admitting: Podiatry

## 2019-02-03 ENCOUNTER — Other Ambulatory Visit: Payer: Self-pay

## 2019-02-03 ENCOUNTER — Ambulatory Visit: Payer: Managed Care, Other (non HMO) | Admitting: Podiatry

## 2019-02-03 DIAGNOSIS — Q828 Other specified congenital malformations of skin: Secondary | ICD-10-CM

## 2019-02-03 DIAGNOSIS — B351 Tinea unguium: Secondary | ICD-10-CM | POA: Diagnosis not present

## 2019-02-03 DIAGNOSIS — E1149 Type 2 diabetes mellitus with other diabetic neurological complication: Secondary | ICD-10-CM | POA: Diagnosis not present

## 2019-02-03 DIAGNOSIS — E11610 Type 2 diabetes mellitus with diabetic neuropathic arthropathy: Secondary | ICD-10-CM | POA: Diagnosis not present

## 2019-02-09 NOTE — Progress Notes (Signed)
Subjective: Mishel presents the office today for evaluation of a pre-ulcerative callus, wound on the lateral aspect of her right foot.  She said the area is doing well and she is having no pain to the area she denies any redness or drainage or any open sores or any swelling.  Also she is asking for the nails to be trimmed today's are thickened elongated she cannot trim them herself. Denies any systemic complaints such as fevers, chills, nausea, vomiting. No acute changes since last appointment, and no other complaints at this time.   Objective: AAO x3, NAD DP/PT pulses palpable bilaterally, CRT less than 3 seconds Hyperkeratotic lesion to the lateral aspect of right foot on third metatarsal base although minimal.  Upon debridement there is no underlying ulceration area is doing very well.  There was some old subungual hematoma left hallux toenail which is growing out.  The nails in general hypertrophic, dystrophic discolored.  Pain of the nails.  No surrounding redness or drainage or signs of infection. No pain with calf compression, swelling, warmth, erythema  Assessment: Healed wound with pre-ulcerative callus right foot, onychomycosis  Plan: -All treatment options discussed with the patient including all alternatives, risks, complications.  -Hyperkeratotic lesion was sharply debrided on the right foot without any complications or bleeding.  Continue to monitor for any skin breakdown.  Continue with the brace as this is been very helpful.  As a courtesy debride the nails x10 without any complications or bleeding. -Patient encouraged to call the office with any questions, concerns, change in symptoms.   Trula Slade DPM

## 2019-02-22 ENCOUNTER — Other Ambulatory Visit: Payer: Self-pay

## 2019-02-22 ENCOUNTER — Ambulatory Visit (INDEPENDENT_AMBULATORY_CARE_PROVIDER_SITE_OTHER): Payer: Managed Care, Other (non HMO) | Admitting: Internal Medicine

## 2019-02-22 ENCOUNTER — Encounter: Payer: Self-pay | Admitting: Internal Medicine

## 2019-02-22 VITALS — BP 126/84 | HR 102 | Temp 98.1°F | Ht 72.0 in | Wt 355.0 lb

## 2019-02-22 DIAGNOSIS — E559 Vitamin D deficiency, unspecified: Secondary | ICD-10-CM

## 2019-02-22 DIAGNOSIS — E785 Hyperlipidemia, unspecified: Secondary | ICD-10-CM | POA: Diagnosis not present

## 2019-02-22 DIAGNOSIS — E11610 Type 2 diabetes mellitus with diabetic neuropathic arthropathy: Secondary | ICD-10-CM | POA: Diagnosis not present

## 2019-02-22 DIAGNOSIS — E538 Deficiency of other specified B group vitamins: Secondary | ICD-10-CM

## 2019-02-22 DIAGNOSIS — Z0000 Encounter for general adult medical examination without abnormal findings: Secondary | ICD-10-CM

## 2019-02-22 DIAGNOSIS — M25512 Pain in left shoulder: Secondary | ICD-10-CM

## 2019-02-22 DIAGNOSIS — N183 Chronic kidney disease, stage 3 unspecified: Secondary | ICD-10-CM

## 2019-02-22 DIAGNOSIS — I10 Essential (primary) hypertension: Secondary | ICD-10-CM

## 2019-02-22 DIAGNOSIS — E611 Iron deficiency: Secondary | ICD-10-CM

## 2019-02-22 LAB — POCT GLYCOSYLATED HEMOGLOBIN (HGB A1C): Hemoglobin A1C: 8.7 % — AB (ref 4.0–5.6)

## 2019-02-22 NOTE — Progress Notes (Signed)
Subjective:    Patient ID: Pamela [name], female    DOB: 05-29-1956, 64 y.o.   MRN: [id number]  HPI    Here to f/u; overall doing ok,  Pt denies chest pain, increasing sob or doe, wheezing, orthopnea, PND, increased LE swelling, palpitations, dizziness or syncope.  Pt denies new neurological symptoms such as new headache, or facial or extremity weakness or numbness.  Pt denies polydipsia, polyuria, or low sugar episode.  Pt states overall good compliance with meds, mostly trying to follow appropriate diet, with wt overall stable,  but little exercise however.  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  .To retire mar 2024.   Also with 3 wks onset left shoulder pain, mild to mod, intermittent, sharp but only worsening with trying to raise the arm, but not to lie on the left side. Past Medical History:  Diagnosis Date  . ALLERGIC RHINITIS 12/31/2007  . ANXIETY 12/31/2007  . Atrial fibrillation ([place])   . DEPRESSION 12/31/2007  . DIABETES MELLITUS, TYPE II 12/31/2007  . Diastolic dysfunction 07/18/9240  . Hyperlipidemia 04/23/2012  . HYPERTENSION 12/31/2007  . Migraine   . PNA (pneumonia)    in her 22s  . Right knee DJD   . Rosacea 10/11/2010   Past Surgical History:  Procedure Laterality Date  . 2 D&C     1980s    reports that she has never smoked. She has never used smokeless tobacco. She reports current alcohol use. She reports that she does not use drugs. family history includes Arthritis in an other family member; Cancer in an other family member; Diabetes in an other family member; Heart disease in an other family member; Mental illness in an other family member; Stroke in an other family member. Allergies  Allergen Reactions  . Cymbalta [Duloxetine Hcl]     "loopiness"  . Gabapentin Itching   Current Outpatient Medications on File Prior to Visit  Medication Sig Dispense Refill  . albuterol (PROVENTIL HFA;VENTOLIN HFA) 108 (90 Base) MCG/ACT  inhaler Inhale 2 puffs into the lungs every 6 (six) hours as needed for wheezing or shortness of breath. 1 Inhaler 0  . atorvastatin (LIPITOR) 20 MG tablet Take 1 tablet (20 mg total) by mouth daily. 90 tablet 3  . cetirizine (ZYRTEC) 10 MG tablet TAKE 1 TABLET(10 MG) BY MOUTH DAILY 90 tablet 3  . cyclobenzaprine (FLEXERIL) 5 MG tablet Take 1 tablet (5 mg total) by mouth 3 (three) times daily as needed for muscle spasms. 60 tablet 1  . diltiazem (CARDIZEM CD) 360 MG 24 hr capsule TAKE 1 CAPSULE(360 MG) BY MOUTH DAILY 30 capsule 11  . empagliflozin (JARDIANCE) 25 MG TABS tablet Take 25 mg by mouth daily. 90 tablet 3  . fluconazole (DIFLUCAN) 150 MG tablet     . furosemide (LASIX) 40 MG tablet Take 1 tablet (40 mg total) by mouth daily. 90 tablet 3  . glipiZIDE (GLUCOTROL XL) 10 MG 24 hr tablet TAKE 1 TABLET(10 MG) BY MOUTH DAILY WITH BREAKFAST 90 tablet 0  . losartan (COZAAR) 50 MG tablet TAKE 1 TABLET(50 MG) BY MOUTH DAILY 30 tablet 3  . metFORMIN (GLUCOPHAGE) 1000 MG tablet TAKE 1 TABLET(1000 MG) BY MOUTH TWICE DAILY WITH A MEAL 180 tablet 1  . metoprolol succinate (TOPROL-XL) 100 MG 24 hr tablet Take 1 tablet (100 mg total) by mouth 2 (two) times daily. 180 tablet 3  . mometasone (ELOCON) 0.1 % cream Apply 1 application topically  2 (two) times daily. 45 g 0  . mupirocin ointment (BACTROBAN) 2 % APPLY EXTERNALLY TO THE AFFECTED AREA TWICE DAILY 22 g 1  . Omega-3 Fatty Acids (FISH OIL) 1000 MG CAPS Take 2 capsules (2,000 mg total) by mouth 2 (two) times daily. 60 capsule 0  . potassium chloride SA (K-DUR) 20 MEQ tablet Take 1 tablet (20 mEq total) by mouth 2 (two) times daily. 60 tablet 6  . pregabalin (LYRICA) 50 MG capsule Take 1 capsule (50 mg total) by mouth 3 (three) times daily. 90 capsule 5  . spironolactone (ALDACTONE) 25 MG tablet TAKE 1 TABLET(25 MG) BY MOUTH DAILY 90 tablet 3  . traMADol (ULTRAM) 50 MG tablet Take 1 tablet (50 mg total) by mouth every 6 (six) hours as needed. 30 tablet 0   . XARELTO 20 MG TABS tablet TAKE 1 TABLET BY MOUTH EVERY DAY WITH SUPPER 90 tablet 3   No current facility-administered medications on file prior to visit.    Review of Systems  Constitutional: Negative for other unusual diaphoresis or sweats HENT: Negative for ear discharge or swelling Eyes: Negative for other worsening visual disturbances Respiratory: Negative for stridor or other swelling  Gastrointestinal: Negative for worsening distension or other blood Genitourinary: Negative for retention or other urinary change Musculoskeletal: Negative for other MSK pain or swelling Skin: Negative for color change or other new lesions Neurological: Negative for worsening tremors and other numbness  Psychiatric/Behavioral: Negative for worsening agitation or other fatigue All ohte system neg pre pt    Objective:   Physical Exam BP 126/84   Pulse (!) 102   Temp 98.1 F (36.7 C) (Oral)   Ht 6' (1.829 m)   Wt (!) 355 lb (161 kg)   LMP 09/08/2010 (Exact Date)   SpO2 95%   BMI 48.15 kg/m  VS noted,  Constitutional: Pt appears in NAD HENT: Head: NCAT.  Right Ear: External ear normal.  Left Ear: External ear normal.  Eyes: . Pupils are equal, round, and reactive to light. Conjunctivae and EOM are normal Nose: without d/c or deformity Neck: Neck supple. Gross normal ROM Cardiovascular: Normal rate and regular rhythm.   Pulmonary/Chest: Effort normal and breath sounds without rales or wheezing.  Abd:  Soft, NT, ND, + BS, no organomegaly Neurological: Pt is alert. At baseline orientation, motor grossly intact Skin: Skin is warm. No rashes, other new lesions, no LE edema Psychiatric: Pt behavior is normal without agitation  No other exam findings  Lab Results  Component Value Date   WBC 10.5 01/11/2019   HGB 12.4 01/11/2019   HCT 40.4 01/11/2019   PLT 237 01/11/2019   GLUCOSE 205 (H) 01/20/2019   CHOL 145 01/20/2019   TRIG 97 01/20/2019   HDL 56 01/20/2019   LDLDIRECT 120.3  04/22/2012   LDLCALC 70 01/20/2019   ALT 17 01/20/2019   AST 15 01/20/2019   NA 139 01/20/2019   K 4.2 01/20/2019   CL 105 01/20/2019   CREATININE 1.97 (H) 01/20/2019   BUN 34 (H) 01/20/2019   CO2 20 (L) 01/20/2019   TSH 2.38 08/24/2018   INR 1.18 12/13/2010   HGBA1C 8.7 (H) 08/24/2018   MICROALBUR 14.5 (H) 08/24/2018      Assessment & Plan:

## 2019-02-22 NOTE — Patient Instructions (Addendum)
Your a1c was OK today  Please continue all other medications as before, and refills have been done if requested.  Please have the pharmacy call with any other refills you may need.  Please continue your efforts at being more active, low cholesterol diet, and weight control..  Please keep your appointments with your specialists as you may have planned  You will be contacted regarding the referral for: sports medicine'  Please call if you change your mind about the referral to Kidney doctor  Please return in 6 months, or sooner if needed, with Lab testing done 3-5 days before

## 2019-02-23 ENCOUNTER — Encounter: Payer: Self-pay | Admitting: Internal Medicine

## 2019-02-23 ENCOUNTER — Other Ambulatory Visit: Payer: Self-pay | Admitting: Internal Medicine

## 2019-02-23 MED ORDER — PIOGLITAZONE HCL 15 MG PO TABS: 15.0000 mg | ORAL_TABLET | Freq: Every day | ORAL | 3 refills | Status: DC

## 2019-02-24 ENCOUNTER — Ambulatory Visit: Payer: Managed Care, Other (non HMO) | Admitting: Internal Medicine

## 2019-02-27 ENCOUNTER — Encounter: Payer: Self-pay | Admitting: Internal Medicine

## 2019-02-27 DIAGNOSIS — M25512 Pain in left shoulder: Secondary | ICD-10-CM | POA: Insufficient documentation

## 2019-02-27 NOTE — Assessment & Plan Note (Signed)
With ? Recent worsening, pt continues to declines renal referral

## 2019-02-27 NOTE — Assessment & Plan Note (Addendum)
stable overall by history and exam, recent data reviewed with pt, and pt to continue medical treatment as before,  to f/u any worsening symptoms or concerns  Note:  Total time for pt hx, exam, review of record with pt in the room, determination of diagnoses and plan for further eval and tx is > 40 min, with over 50% spent in coordination and counseling of patient including the differential dx, tx, further evaluation and other management of DM, HTN, HLD, left shoulder pain, and severe CKD

## 2019-02-27 NOTE — Assessment & Plan Note (Signed)
stable overall by history and exam, recent data reviewed with pt, and pt to continue medical treatment as before,  to f/u any worsening symptoms or concerns  

## 2019-02-27 NOTE — Assessment & Plan Note (Signed)
C/w probable rotater cuff disease, for refer sport medicine, pain control

## 2019-02-28 ENCOUNTER — Telehealth: Payer: Self-pay | Admitting: Internal Medicine

## 2019-02-28 MED ORDER — PREGABALIN 50 MG PO CAPS: 50.0000 mg | ORAL_CAPSULE | Freq: Three times a day (TID) | ORAL | 5 refills | Status: DC

## 2019-02-28 NOTE — Telephone Encounter (Signed)
Done erx 

## 2019-03-05 ENCOUNTER — Other Ambulatory Visit (HOSPITAL_COMMUNITY): Payer: Self-pay | Admitting: Cardiology

## 2019-03-12 ENCOUNTER — Ambulatory Visit (INDEPENDENT_AMBULATORY_CARE_PROVIDER_SITE_OTHER): Payer: Managed Care, Other (non HMO)

## 2019-03-12 DIAGNOSIS — Z23 Encounter for immunization: Secondary | ICD-10-CM | POA: Diagnosis not present

## 2019-03-14 ENCOUNTER — Other Ambulatory Visit (HOSPITAL_COMMUNITY): Payer: Managed Care, Other (non HMO)

## 2019-03-17 ENCOUNTER — Telehealth (HOSPITAL_COMMUNITY): Payer: Self-pay

## 2019-03-17 ENCOUNTER — Other Ambulatory Visit: Payer: Self-pay

## 2019-03-17 ENCOUNTER — Ambulatory Visit (HOSPITAL_COMMUNITY)
Admission: RE | Admit: 2019-03-17 | Discharge: 2019-03-17 | Disposition: A | Discharge status: Home / Self Care | Payer: Managed Care, Other (non HMO) | Source: Ambulatory Visit | Attending: Internal Medicine | Admitting: Internal Medicine

## 2019-03-17 DIAGNOSIS — I5032 Chronic diastolic (congestive) heart failure: Secondary | ICD-10-CM

## 2019-03-17 DIAGNOSIS — E785 Hyperlipidemia, unspecified: Secondary | ICD-10-CM | POA: Insufficient documentation

## 2019-03-17 LAB — LIPID PANEL
Cholesterol: 159 mg/dL (ref 0–200)
HDL: 46 mg/dL (ref >40)
LDL Cholesterol: 89 mg/dL (ref 0–99)
Total CHOL/HDL Ratio: 3.5 RATIO
Triglycerides: 122 mg/dL (ref <150)
VLDL: 24 mg/dL (ref 0–40)

## 2019-03-17 LAB — HEPATIC FUNCTION PANEL
ALT: 18 U/L (ref 0–44)
AST: 16 U/L (ref 15–41)
Albumin: 3.8 g/dL (ref 3.5–5.0)
Alkaline Phosphatase: 52 U/L (ref 38–126)
Bilirubin, Direct: <0.1 mg/dL (ref 0.0–0.2)
Total Bilirubin: 0.4 mg/dL (ref 0.3–1.2)
Total Protein: 7.7 g/dL (ref 6.5–8.1)

## 2019-03-17 MED ORDER — ATORVASTATIN CALCIUM 40 MG PO TABS: 40.0000 mg | ORAL_TABLET | Freq: Every day | ORAL | 6 refills | Status: DC

## 2019-03-17 NOTE — Telephone Encounter (Signed)
-----   Message from Larey Dresser, MD sent at 03/17/2019 10:55 AM EDT ----- Want LDL < 70, increase atorvastatin to 40 mg daily.  Lipids/LFTs in 2 months.

## 2019-03-17 NOTE — Telephone Encounter (Signed)
Orders Placed This Encounter  Procedures  . Hepatic function panel    Standing Status:   Future    Standing Expiration Date:   03/16/2020  . Lipid Profile    Standing Status:   Future    Standing Expiration Date:   03/16/2020    Per DM

## 2019-03-17 NOTE — Telephone Encounter (Signed)
Patient made aware of lab results. Aware to increase atorvastatin to 40mg  daily.  rtc in 2 months for repeat labs

## 2019-04-04 ENCOUNTER — Other Ambulatory Visit (HOSPITAL_COMMUNITY): Payer: Self-pay | Admitting: *Deleted

## 2019-04-04 MED ORDER — ATORVASTATIN CALCIUM 40 MG PO TABS: 40.0000 mg | ORAL_TABLET | Freq: Every day | ORAL | 6 refills | Status: DC

## 2019-04-05 ENCOUNTER — Encounter: Payer: Self-pay | Admitting: Family Medicine

## 2019-04-05 ENCOUNTER — Ambulatory Visit (INDEPENDENT_AMBULATORY_CARE_PROVIDER_SITE_OTHER)
Admission: RE | Admit: 2019-04-05 | Discharge: 2019-04-05 | Disposition: A | Discharge status: Home / Self Care | Payer: Managed Care, Other (non HMO) | Source: Ambulatory Visit | Attending: Family Medicine | Admitting: Family Medicine

## 2019-04-05 ENCOUNTER — Other Ambulatory Visit: Payer: Self-pay

## 2019-04-05 ENCOUNTER — Ambulatory Visit (INDEPENDENT_AMBULATORY_CARE_PROVIDER_SITE_OTHER): Payer: Managed Care, Other (non HMO) | Admitting: Family Medicine

## 2019-04-05 VITALS — BP 140/90 | HR 97 | Ht 72.0 in | Wt 370.0 lb

## 2019-04-05 DIAGNOSIS — M25512 Pain in left shoulder: Secondary | ICD-10-CM

## 2019-04-05 DIAGNOSIS — G8929 Other chronic pain: Secondary | ICD-10-CM

## 2019-04-05 NOTE — Patient Instructions (Addendum)
Exercise 3 times a week Xray downstairs Pennsaid small amount over the most painful spot See me again in 4-6 weeks

## 2019-04-05 NOTE — Progress Notes (Signed)
[facility] [address] [place], [place] [address] Phone: [phone] Subjective:   I Ronelle Nigh am serving as a Neurosurgeon for Dr. Antoine Primas.  I'm seeing this patient by the request  of:    CC: Left shoulder pain  MIW:[id number]  Pamela [name] is a 63 y.o. female coming in with complaint of left shoulder pain. Pain radiates down the arm. ROM is limited in some motions. History of neuropathy her her feet and feels it sometimes in her hands. Types all day.   Onset- Chronic (6+ months) Location -posterior and superior Character- achy, constant pain  Aggravating factors- washing her hair Reliving factors-  Therapies tried- Tylenol arthritis strength, muscle relaxer  Severity-  5/10 at its worse     Past Medical History:  Diagnosis Date  . ALLERGIC RHINITIS 12/31/2007  . ANXIETY 12/31/2007  . Atrial fibrillation (HCC)   . DEPRESSION 12/31/2007  . DIABETES MELLITUS, TYPE II 12/31/2007  . Diastolic dysfunction 10/11/2010  . Hyperlipidemia 04/23/2012  . HYPERTENSION 12/31/2007  . Migraine   . PNA (pneumonia)    in her 58s  . Right knee DJD   . Rosacea 10/11/2010   Past Surgical History:  Procedure Laterality Date  . 2 D&C     1980s   Social History   Socioeconomic History  . Marital status: Single    Spouse name: Not on file  . Number of children: 0  . Years of education: Not on file  . Highest education level: Not on file  Occupational History  . 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 Financial resource strain: Not on file  . Food insecurity    Worry: Not on file    Inability: Not on file  . Transportation needs    Medical: Not on file    Non-medical: Not on file  Tobacco Use  . Smoking status: Never Smoker  . Smokeless tobacco: Never Used  Substance and Sexual Activity  . Alcohol use: Yes    Comment: social  . Drug use: No  . Sexual activity: Not on file  Lifestyle  . Physical activity     Days per week: Not on file    Minutes per session: Not on file  . Stress: Not on file  Relationships  . Social Musician on phone: Not on file    Gets together: Not on file    Attends religious service: Not on file    Active member of club or organization: Not on file    Attends meetings of clubs or organizations: Not on file    Relationship status: Not on file  Other Topics Concern  . Not on file  Social History Narrative  . Not on file   Allergies  Allergen Reactions  . Cymbalta [Duloxetine Hcl]     "loopiness"  . Gabapentin Itching   Family History  Problem Relation Age of Onset  . Arthritis Other   . Cancer Other        breast  . Heart disease Other   . Stroke Other   . Mental illness Other        emotional  . Diabetes Other     Current Outpatient Medications (Endocrine & Metabolic):  .  empagliflozin (JARDIANCE) 25 MG TABS tablet, Take 25 mg by mouth daily. Marland Kitchen  glipiZIDE (GLUCOTROL XL) 10 MG 24 hr tablet, TAKE 1 TABLET(10 MG) BY MOUTH  DAILY WITH BREAKFAST .  metFORMIN (GLUCOPHAGE) 1000 MG tablet, TAKE 1 TABLET(1000 MG) BY MOUTH TWICE DAILY WITH A MEAL .  pioglitazone (ACTOS) 15 MG tablet, Take 1 tablet (15 mg total) by mouth daily.  Current Outpatient Medications (Cardiovascular):  .  atorvastatin (LIPITOR) 40 MG tablet, Take 1 tablet (40 mg total) by mouth daily. Marland Kitchen  diltiazem (CARDIZEM CD) 360 MG 24 hr capsule, TAKE 1 CAPSULE(360 MG) BY MOUTH DAILY .  furosemide (LASIX) 40 MG tablet, Take 1 tablet (40 mg total) by mouth daily. Marland Kitchen  losartan (COZAAR) 50 MG tablet, TAKE 1 TABLET(50 MG) BY MOUTH DAILY .  metoprolol succinate (TOPROL-XL) 100 MG 24 hr tablet, Take 1 tablet (100 mg total) by mouth 2 (two) times daily. Marland Kitchen  spironolactone (ALDACTONE) 25 MG tablet, TAKE 1 TABLET(25 MG) BY MOUTH DAILY  Current Outpatient Medications (Respiratory):  .  albuterol (PROVENTIL HFA;VENTOLIN HFA) 108 (90 Base) MCG/ACT inhaler, Inhale 2 puffs into the lungs every 6  (six) hours as needed for wheezing or shortness of breath. .  cetirizine (ZYRTEC) 10 MG tablet, TAKE 1 TABLET(10 MG) BY MOUTH DAILY  Current Outpatient Medications (Analgesics):  .  traMADol (ULTRAM) 50 MG tablet, Take 1 tablet (50 mg total) by mouth every 6 (six) hours as needed.  Current Outpatient Medications (Hematological):  Marland Kitchen  XARELTO 20 MG TABS tablet, TAKE 1 TABLET BY MOUTH EVERY DAY WITH SUPPER  Current Outpatient Medications (Other):  .  cyclobenzaprine (FLEXERIL) 5 MG tablet, Take 1 tablet (5 mg total) by mouth 3 (three) times daily as needed for muscle spasms. .  fluconazole (DIFLUCAN) 150 MG tablet,  .  mometasone (ELOCON) 0.1 % cream, Apply 1 application topically 2 (two) times daily. .  mupirocin ointment (BACTROBAN) 2 %, APPLY EXTERNALLY TO THE AFFECTED AREA TWICE DAILY .  Omega-3 Fatty Acids (FISH OIL) 1000 MG CAPS, Take 2 capsules (2,000 mg total) by mouth 2 (two) times daily. .  potassium chloride SA (K-DUR) 20 MEQ tablet, Take 1 tablet (20 mEq total) by mouth 2 (two) times daily. .  pregabalin (LYRICA) 50 MG capsule, Take 1 capsule (50 mg total) by mouth 3 (three) times daily.    Past medical history, social, surgical and family history all reviewed in electronic medical record.  No pertanent information unless stated regarding to the chief complaint.   Review of Systems:  No headache, visual changes, nausea, vomiting, diarrhea, constipation, dizziness, abdominal pain, skin rash, fevers, chills, night sweats, weight loss, swollen lymph nodes, body aches, joint swelling, chest pain, shortness of breath, mood changes.  Positive muscle aches  Objective  Blood pressure 140/90, pulse 97, height 6' (1.829 m), weight (!) 370 lb (167.8 kg), last menstrual period 09/08/2010, SpO2 96 %.    General: No apparent distress alert and oriented x3 mood and affect normal, dressed appropriately.  Morbidly obese HEENT: Pupils equal, extraocular movements intact  Respiratory: Patient's  speak in full sentences and does not appear short of breath  Cardiovascular: No lower extremity edema, non tender, no erythema  Skin: Warm dry intact with no signs of infection or rash on extremities or on axial skeleton.  Abdomen: Soft nontender  Neuro: Cranial nerves II through XII are intact, neurovascularly intact in all extremities with 2+ DTRs and 2+ pulses.  Lymph: No lymphadenopathy of posterior or anterior cervical chain or axillae bilaterally.  Gait severely antalgic MSK:  tender with full range of motion and good stability and symmetric strength and tone of  elbows, wrist, hip, knee and  ankles bilaterally.  Left shoulder exam shows the patient does have pain more posteriorly.  Patient does have positive impingement.  Mild pain with crossover.  Rotator cuff strength 5 out of 5.  Shoulder injection   Impression and Recommendations:     This case required medical decision making of moderate complexity. The above documentation has been reviewed and is accurate and complete Judi SaaZachary M Smith, DO       Note: This dictation was prepared with Dragon dictation along with smaller phrase technology. Any transcriptional errors that result from this process are unintentional.

## 2019-04-05 NOTE — Assessment & Plan Note (Signed)
Injection given today.  Discussed icing regimen and home exercise, discussed which activities to do which wants to avoid.  Patient can increase activity as tolerated.  Patient will follow up with me again in 4 to 8 weeks.

## 2019-04-11 ENCOUNTER — Telehealth: Payer: Self-pay | Admitting: Internal Medicine

## 2019-04-11 MED ORDER — GLIPIZIDE ER 10 MG PO TB24: ORAL_TABLET | ORAL | 1 refills | Status: DC

## 2019-04-11 NOTE — Telephone Encounter (Signed)
Sent!

## 2019-04-11 NOTE — Telephone Encounter (Signed)
Medication Refill - Medication:  glipiZIDE (GLUCOTROL XL) 10 MG 24 hr tablet [[id number]]    Has the patient contacted their pharmacy? No. (Agent: If no, request that the patient contact the pharmacy for the refill.) (Agent: If yes, when and what did the pharmacy advise?) [place], [address] AT [address] [place] [place] [phone] (Phone)  She stated that she called this in last Monday .    Preferred Pharmacy (with phone number or street name):   Agent: Please be advised that RX refills may take up to 3 business days. We ask that you follow-up with your pharmacy.

## 2019-04-14 ENCOUNTER — Ambulatory Visit: Payer: Managed Care, Other (non HMO) | Admitting: Podiatry

## 2019-04-25 ENCOUNTER — Other Ambulatory Visit (HOSPITAL_COMMUNITY): Payer: Self-pay | Admitting: *Deleted

## 2019-04-25 MED ORDER — FUROSEMIDE 40 MG PO TABS: 40.0000 mg | ORAL_TABLET | Freq: Every day | ORAL | 3 refills | Status: DC

## 2019-04-28 ENCOUNTER — Other Ambulatory Visit (HOSPITAL_COMMUNITY): Payer: Self-pay

## 2019-04-28 MED ORDER — FUROSEMIDE 40 MG PO TABS: 40.0000 mg | ORAL_TABLET | Freq: Every day | ORAL | 3 refills | Status: DC

## 2019-04-29 ENCOUNTER — Other Ambulatory Visit: Payer: Self-pay | Admitting: Internal Medicine

## 2019-04-29 MED ORDER — METFORMIN HCL 1000 MG PO TABS: ORAL_TABLET | ORAL | 1 refills | Status: DC

## 2019-04-29 MED ORDER — METOPROLOL SUCCINATE ER 100 MG PO TB24: 100.0000 mg | ORAL_TABLET | Freq: Two times a day (BID) | ORAL | 1 refills | Status: DC

## 2019-04-29 NOTE — Telephone Encounter (Signed)
Medication Refill - Medication:  metFORMIN (GLUCOPHAGE) 1000 MG tablet    metoprolol succinate (TOPROL-XL) 100 MG 24 hr tablet     Has the patient contacted their pharmacy? Yes - states pharmacy has been requesting since Monday with no response. (Agent: If no, request that the patient contact the pharmacy for the refill.) (Agent: If yes, when and what did the pharmacy advise?)  Preferred Pharmacy (with phone number or street name):  [facility] [place], [address] AT [place] [phone] (Phone) [phone] (Fax)   Agent: Please be advised that RX refills may take up to 3 business days. We ask that you follow-up with your pharmacy.

## 2019-05-17 ENCOUNTER — Other Ambulatory Visit (HOSPITAL_COMMUNITY): Payer: Managed Care, Other (non HMO)

## 2019-07-18 ENCOUNTER — Other Ambulatory Visit (HOSPITAL_COMMUNITY): Payer: Self-pay

## 2019-07-18 MED ORDER — LOSARTAN POTASSIUM 50 MG PO TABS: ORAL_TABLET | ORAL | 3 refills | Status: DC

## 2019-08-15 ENCOUNTER — Other Ambulatory Visit: Payer: Self-pay | Admitting: Internal Medicine

## 2019-08-22 ENCOUNTER — Other Ambulatory Visit (HOSPITAL_COMMUNITY): Payer: Self-pay | Admitting: *Deleted

## 2019-08-22 MED ORDER — DILTIAZEM HCL ER COATED BEADS 360 MG PO CP24: ORAL_CAPSULE | ORAL | 11 refills | Status: DC

## 2019-08-29 ENCOUNTER — Other Ambulatory Visit (HOSPITAL_COMMUNITY): Payer: Self-pay

## 2019-08-29 MED ORDER — POTASSIUM CHLORIDE CRYS ER 20 MEQ PO TBCR: 20.0000 meq | EXTENDED_RELEASE_TABLET | Freq: Two times a day (BID) | ORAL | 6 refills | Status: DC

## 2019-09-04 ENCOUNTER — Other Ambulatory Visit: Payer: Self-pay | Admitting: Internal Medicine

## 2019-09-25 ENCOUNTER — Other Ambulatory Visit: Payer: Self-pay | Admitting: Internal Medicine

## 2019-10-23 ENCOUNTER — Other Ambulatory Visit: Payer: Self-pay | Admitting: Internal Medicine

## 2019-10-23 NOTE — Telephone Encounter (Signed)
Please refill as per office routine med refill policy (all routine meds refilled for 3 mo or monthly per pt preference up to one year from last visit, then month to month grace period for 3 mo, then further med refills will have to be denied)  

## 2019-10-24 ENCOUNTER — Other Ambulatory Visit (HOSPITAL_COMMUNITY): Payer: Self-pay

## 2019-10-24 MED ORDER — ATORVASTATIN CALCIUM 40 MG PO TABS: 40.0000 mg | ORAL_TABLET | Freq: Every day | ORAL | 2 refills | Status: DC

## 2019-10-27 ENCOUNTER — Other Ambulatory Visit: Payer: Self-pay | Admitting: Internal Medicine

## 2019-11-26 ENCOUNTER — Other Ambulatory Visit: Payer: Self-pay | Admitting: Internal Medicine

## 2019-11-27 NOTE — Telephone Encounter (Signed)
Please refill as per office routine med refill policy (all routine meds refilled for 3 mo or monthly per pt preference up to one year from last visit, then month to month grace period for 3 mo, then further med refills will have to be denied)  

## 2019-11-28 ENCOUNTER — Other Ambulatory Visit (HOSPITAL_COMMUNITY): Payer: Self-pay | Admitting: *Deleted

## 2019-11-28 MED ORDER — LOSARTAN POTASSIUM 50 MG PO TABS: ORAL_TABLET | ORAL | 3 refills | Status: DC

## 2019-12-01 ENCOUNTER — Other Ambulatory Visit: Payer: Self-pay | Admitting: Internal Medicine

## 2019-12-01 NOTE — Telephone Encounter (Signed)
Please refill as per office routine med refill policy (all routine meds refilled for 3 mo or monthly per pt preference up to one year from last visit, then month to month grace period for 3 mo, then further med refills will have to be denied)  

## 2019-12-13 ENCOUNTER — Other Ambulatory Visit (HOSPITAL_COMMUNITY): Payer: Self-pay | Admitting: *Deleted

## 2019-12-13 MED ORDER — FUROSEMIDE 40 MG PO TABS: 40.0000 mg | ORAL_TABLET | Freq: Every day | ORAL | 0 refills | Status: DC

## 2019-12-24 ENCOUNTER — Other Ambulatory Visit: Payer: Self-pay | Admitting: Internal Medicine

## 2019-12-24 NOTE — Telephone Encounter (Signed)
Please refill as per office routine med refill policy (all routine meds refilled for 3 mo or monthly per pt preference up to one year from last visit, then month to month grace period for 3 mo, then further med refills will have to be denied)  

## 2019-12-28 ENCOUNTER — Other Ambulatory Visit: Payer: Self-pay | Admitting: Internal Medicine

## 2020-01-07 ENCOUNTER — Other Ambulatory Visit (HOSPITAL_COMMUNITY): Payer: Self-pay | Admitting: Cardiology

## 2020-01-19 ENCOUNTER — Ambulatory Visit: Payer: Self-pay

## 2020-01-21 ENCOUNTER — Other Ambulatory Visit: Payer: Self-pay | Admitting: Internal Medicine

## 2020-01-21 NOTE — Telephone Encounter (Signed)
Please refill as per office routine med refill policy (all routine meds refilled for 3 mo or monthly per pt preference up to one year from last visit, then month to month grace period for 3 mo, then further med refills will have to be denied)  

## 2020-01-24 ENCOUNTER — Ambulatory Visit: Payer: Managed Care, Other (non HMO)

## 2020-01-25 ENCOUNTER — Other Ambulatory Visit: Payer: Self-pay | Admitting: Internal Medicine

## 2020-01-25 ENCOUNTER — Other Ambulatory Visit (HOSPITAL_COMMUNITY): Payer: Self-pay | Admitting: Cardiology

## 2020-01-25 NOTE — Telephone Encounter (Signed)
Please refill as per office routine med refill policy (all routine meds refilled for 3 mo or monthly per pt preference up to one year from last visit, then month to month grace period for 3 mo, then further med refills will have to be denied)  

## 2020-01-27 ENCOUNTER — Other Ambulatory Visit: Payer: Self-pay | Admitting: Internal Medicine

## 2020-01-27 NOTE — Telephone Encounter (Signed)
Please refill as per office routine med refill policy (all routine meds refilled for 3 mo or monthly per pt preference up to one year from last visit, then month to month grace period for 3 mo, then further med refills will have to be denied)  

## 2020-02-06 ENCOUNTER — Other Ambulatory Visit (HOSPITAL_COMMUNITY): Payer: Self-pay | Admitting: *Deleted

## 2020-02-06 MED ORDER — SPIRONOLACTONE 25 MG PO TABS: ORAL_TABLET | ORAL | 0 refills | Status: DC

## 2020-02-06 MED ORDER — RIVAROXABAN 20 MG PO TABS: ORAL_TABLET | ORAL | 0 refills | Status: DC

## 2020-02-08 ENCOUNTER — Other Ambulatory Visit (HOSPITAL_COMMUNITY): Payer: Self-pay | Admitting: Cardiology

## 2020-02-17 ENCOUNTER — Other Ambulatory Visit: Payer: Self-pay | Admitting: Internal Medicine

## 2020-02-17 NOTE — Telephone Encounter (Signed)
Please refill as per office routine med refill policy (all routine meds refilled for 3 mo or monthly per pt preference up to one year from last visit, then month to month grace period for 3 mo, then further med refills will have to be denied)  

## 2020-02-21 ENCOUNTER — Other Ambulatory Visit: Payer: Self-pay | Admitting: Internal Medicine

## 2020-02-21 ENCOUNTER — Other Ambulatory Visit (HOSPITAL_COMMUNITY): Payer: Self-pay | Admitting: Cardiology

## 2020-02-21 NOTE — Telephone Encounter (Signed)
Please refill as per office routine med refill policy (all routine meds refilled for 3 mo or monthly per pt preference up to one year from last visit, then month to month grace period for 3 mo, then further med refills will have to be denied)  

## 2020-02-23 ENCOUNTER — Other Ambulatory Visit: Payer: Self-pay | Admitting: Internal Medicine

## 2020-02-23 NOTE — Telephone Encounter (Signed)
Please refill as per office routine med refill policy (all routine meds refilled for 3 mo or monthly per pt preference up to one year from last visit, then month to month grace period for 3 mo, then further med refills will have to be denied)  

## 2020-03-09 ENCOUNTER — Encounter (HOSPITAL_COMMUNITY): Payer: Self-pay

## 2020-03-09 ENCOUNTER — Emergency Department (HOSPITAL_COMMUNITY)
Admission: EM | Admit: 2020-03-09 | Discharge: 2020-03-10 | Disposition: A | Discharge status: Home / Self Care | Payer: Managed Care, Other (non HMO) | Attending: Emergency Medicine | Admitting: Emergency Medicine

## 2020-03-09 DIAGNOSIS — Z5321 Procedure and treatment not carried out due to patient leaving prior to being seen by health care provider: Secondary | ICD-10-CM | POA: Diagnosis not present

## 2020-03-09 DIAGNOSIS — R42 Dizziness and giddiness: Secondary | ICD-10-CM | POA: Diagnosis not present

## 2020-03-09 DIAGNOSIS — S61317A Laceration without foreign body of left little finger with damage to nail, initial encounter: Secondary | ICD-10-CM | POA: Diagnosis not present

## 2020-03-09 DIAGNOSIS — W19XXXA Unspecified fall, initial encounter: Secondary | ICD-10-CM | POA: Insufficient documentation

## 2020-03-09 DIAGNOSIS — R112 Nausea with vomiting, unspecified: Secondary | ICD-10-CM | POA: Diagnosis not present

## 2020-03-09 LAB — COMPREHENSIVE METABOLIC PANEL
ALT: 15 U/L (ref 0–44)
AST: 13 U/L — ABNORMAL LOW (ref 15–41)
Albumin: 3.6 g/dL (ref 3.5–5.0)
Alkaline Phosphatase: 51 U/L (ref 38–126)
Anion gap: 11 (ref 5–15)
BUN: 19 mg/dL (ref 8–23)
CO2: 24 mmol/L (ref 22–32)
Calcium: 9.2 mg/dL (ref 8.9–10.3)
Chloride: 105 mmol/L (ref 98–111)
Creatinine, Ser: 1.66 mg/dL — ABNORMAL HIGH (ref 0.44–1.00)
GFR calc Af Amer: 37 mL/min — ABNORMAL LOW (ref >60)
GFR calc non Af Amer: 32 mL/min — ABNORMAL LOW (ref >60)
Glucose, Bld: 133 mg/dL — ABNORMAL HIGH (ref 70–99)
Potassium: 4.7 mmol/L (ref 3.5–5.1)
Sodium: 140 mmol/L (ref 135–145)
Total Bilirubin: 1.1 mg/dL (ref 0.3–1.2)
Total Protein: 6.7 g/dL (ref 6.5–8.1)

## 2020-03-09 LAB — CBC
HCT: 37.8 % (ref 36.0–46.0)
Hemoglobin: 11.6 g/dL — ABNORMAL LOW (ref 12.0–15.0)
MCH: 26.1 pg (ref 26.0–34.0)
MCHC: 30.7 g/dL (ref 30.0–36.0)
MCV: 84.9 fL (ref 80.0–100.0)
Platelets: 244 10*3/uL (ref 150–400)
RBC: 4.45 MIL/uL (ref 3.87–5.11)
RDW: 16.1 % — ABNORMAL HIGH (ref 11.5–15.5)
WBC: 10.1 10*3/uL (ref 4.0–10.5)
nRBC: 0 % (ref 0.0–0.2)

## 2020-03-09 LAB — LIPASE, BLOOD: Lipase: 25 U/L (ref 11–51)

## 2020-03-09 NOTE — ED Notes (Signed)
Pt ask to remove IV from anterior wrist

## 2020-03-09 NOTE — ED Triage Notes (Signed)
Pt from home with ems c.o n/v intermittent since 3 am along with dizziness. Around 4am she fell from the dizziness, caught herself with her hands, small laceration to left pinky fingernail. Unable to take any home medications due to n/v. Pt has not seen her PCP for a while.   CBG 145 HR 120 initially then 90-100 after of fluid BP 150/110

## 2020-03-16 ENCOUNTER — Other Ambulatory Visit (HOSPITAL_COMMUNITY): Payer: Self-pay | Admitting: *Deleted

## 2020-03-16 MED ORDER — FUROSEMIDE 40 MG PO TABS
40.0000 mg | ORAL_TABLET | Freq: Every day | ORAL | 0 refills | Status: DC
Start: 2020-03-16 — End: 2020-06-21

## 2020-03-19 ENCOUNTER — Other Ambulatory Visit: Payer: Self-pay

## 2020-03-19 NOTE — Progress Notes (Signed)
Subjective:    Patient ID: Pamela [name], female    DOB: 24-Apr-1956, 64 y.o.   MRN: [id number]  HPI The patient is here for an acute visit.   Dizziness, fallen down a few times.   She does not feel good.  She has not felt good since 10/1 - started at 2-3 am.  She had dizziness to the point that when she stood up she fell.  That happened a few times.  She called 911.  She sat in the ED x 9 hrs and was not seen.  That Sunday she had another episodes of dizziness and N/V.   Since then she has had dizzy/lightheadedness, and nausea.  She has not vomited.    This am she had another episode.  She feels it the worse with sitting up in bed.  Not sure with other head movements.    She had one prior episode of dizziness years ago.   If was only transient and was nothing compared to what she is experiencing now.  She has not been able to work since this started..    Her chest feels tight in the back between her shoulder blades and in the front of her chest.  It feels like she has bronchitis.  She does have a cough, but felt it was from allergies.  She does have some shortness of breath.  She states some mild congestion, palpitations and increased leg swelling.  She denies any new numbness or tingling.  She has had headaches since this started.  She has been out of her Lasix for the past 2 weeks.   She has been taking all of her other medications and has been taking her Xarelto religiously.     Medications and allergies reviewed with patient and updated if appropriate.  Patient Active Problem List   Diagnosis Date Noted   Left shoulder pain 02/27/2019   Acute upper respiratory infection 09/07/2018   Low back pain 12/01/2017   Diabetic foot ulcer (HCC) 03/03/2017   Charcot foot due to diabetes mellitus (HCC) 02/28/2017   Diabetic ulcer of toe of left foot associated with type 2 diabetes mellitus, limited to breakdown of skin (HCC) 02/28/2017   Obesity, Class III, BMI 40-49.9  (morbid obesity) (HCC) 02/28/2017   Chronic atrial fibrillation (HCC) 12/08/2015   Navicular fracture of ankle with malunion 05/30/2015   Neuropathic pain of foot 05/30/2015   Navicular fracture, foot 05/02/2015   Right ankle pain 04/19/2015   Cough 11/17/2014   Fatigue 09/25/2014   Ganglion cyst 02/06/2014   Bilateral hand pain 11/29/2012   Anemia, unspecified 04/23/2012   CKD (chronic kidney disease) stage 3, GFR 30-59 ml/min (HCC) 04/23/2012   Hyperlipidemia 04/23/2012   Peripheral neuropathy 01/16/2012   DJD (degenerative joint disease) of knee 04/15/2011   Migraine 04/15/2011   Chronic diastolic heart failure (HCC) 10/28/2010   Rosacea 10/11/2010   Anticoagulated 10/11/2010   Atrial fibrillation (HCC) 10/07/2010   Encounter for preventative adult health care exam with abnormal findings 10/06/2010   Diabetes (HCC) 12/31/2007   ANXIETY 12/31/2007   DEPRESSION 12/31/2007   Essential hypertension 12/31/2007   ALLERGIC RHINITIS 12/31/2007    Current Outpatient Medications on File Prior to Visit  Medication Sig Dispense Refill   albuterol (PROVENTIL HFA;VENTOLIN HFA) 108 (90 Base) MCG/ACT inhaler Inhale 2 puffs into the lungs every 6 (six) hours as needed for wheezing or shortness of breath. 1 Inhaler 0   atorvastatin (LIPITOR) 40 MG tablet TAKE 1 TABLET(40  MG) BY MOUTH DAILY 30 tablet 2   cetirizine (ZYRTEC) 10 MG tablet TAKE 1 TABLET(10 MG) BY MOUTH DAILY 90 tablet 3   cyclobenzaprine (FLEXERIL) 5 MG tablet Take 1 tablet (5 mg total) by mouth 3 (three) times daily as needed for muscle spasms. 60 tablet 1   diltiazem (CARDIZEM CD) 360 MG 24 hr capsule TAKE 1 CAPSULE(360 MG) BY MOUTH DAILY 30 capsule 11   fluconazole (DIFLUCAN) 150 MG tablet      furosemide (LASIX) 40 MG tablet Take 1 tablet (40 mg total) by mouth daily. PLEASE CALL FOR OFFICE VISIT [phone] 30 tablet 0   glipiZIDE (GLUCOTROL XL) 10 MG 24 hr tablet TAKE 1 TABLET BY MOUTH DAILY  WITH BREAKFAST( NEEDS APPOINTMENT) 30 tablet 0   JARDIANCE 25 MG TABS tablet TAKE 1 TABLET BY MOUTH DAILY 30 tablet 0   losartan (COZAAR) 50 MG tablet Take 1 tablet (50 mg total) by mouth daily. Needs appt for further refills 30 tablet 2   metFORMIN (GLUCOPHAGE) 1000 MG tablet TAKE 1 TABLET BY MOUTH TWICE DAILY WITH A MEAL 60 tablet 0   metoprolol succinate (TOPROL-XL) 100 MG 24 hr tablet TAKE 1 TABLET BY MOUTH DAILY 30 tablet 0   mometasone (ELOCON) 0.1 % cream Apply 1 application topically 2 (two) times daily. 45 g 0   mupirocin ointment (BACTROBAN) 2 % APPLY EXTERNALLY TO THE AFFECTED AREA TWICE DAILY 22 g 1   Omega-3 Fatty Acids (FISH OIL) 1000 MG CAPS Take 2 capsules (2,000 mg total) by mouth 2 (two) times daily. 60 capsule 0   pioglitazone (ACTOS) 15 MG tablet TAKE 1 TABLET(15 MG) BY MOUTH DAILY 90 tablet 1   potassium chloride SA (KLOR-CON) 20 MEQ tablet Take 1 tablet (20 mEq total) by mouth 2 (two) times daily. 60 tablet 6   pregabalin (LYRICA) 50 MG capsule TAKE 1 CAPSULE(50 MG) BY MOUTH THREE TIMES DAILY 90 capsule 5   rivaroxaban (XARELTO) 20 MG TABS tablet Take 1 tablet (20 mg total) by mouth daily with supper. Needs appt for further refills 90 tablet 0   spironolactone (ALDACTONE) 25 MG tablet Take 1 tablet (25 mg total) by mouth daily. Needs an appt for further refills 90 tablet 0   traMADol (ULTRAM) 50 MG tablet Take 1 tablet (50 mg total) by mouth every 6 (six) hours as needed. 30 tablet 0   No current facility-administered medications on file prior to visit.    Past Medical History:  Diagnosis Date   ALLERGIC RHINITIS 12/31/2007   ANXIETY 12/31/2007   Atrial fibrillation (HCC)    DEPRESSION 12/31/2007   DIABETES MELLITUS, TYPE II 12/31/2007   Diastolic dysfunction 10/11/2010   Hyperlipidemia 04/23/2012   HYPERTENSION 12/31/2007   Migraine    PNA (pneumonia)    in her 20s   Right knee DJD    Rosacea 10/11/2010    Past Surgical History:  Procedure  Laterality Date   2 D&C     1980s    Social History   Socioeconomic History   Marital status: Single    Spouse name: Not on file   Number of children: 0   Years of education: Not on file   Highest education level: Not on file  Occupational History   Occupation: CSR Customer serv rep    Employer: [employer]  Tobacco Use   Smoking status: Never Smoker   Smokeless tobacco: Never Used  Substance and Sexual Activity   Alcohol use: Yes    Comment: social  Drug use: No   Sexual activity: Not on file  Other Topics Concern   Not on file  Social History Narrative   Not on file   Social Determinants of Health   Financial Resource Strain:    Difficulty of Paying Living Expenses: Not on file  Food Insecurity:    Worried About Running Out of Food in the Last Year: Not on file   Ran Out of Food in the Last Year: Not on file  Transportation Needs:    Lack of Transportation (Medical): Not on file   Lack of Transportation (Non-Medical): Not on file  Physical Activity:    Days of Exercise per Week: Not on file   Minutes of Exercise per Session: Not on file  Stress:    Feeling of Stress : Not on file  Social Connections:    Frequency of Communication with Friends and Family: Not on file   Frequency of Social Gatherings with Friends and Family: Not on file   Attends Religious Services: Not on file   Active Member of Clubs or Organizations: Not on file   Attends Banker Meetings: Not on file   Marital Status: Not on file    Family History  Problem Relation Age of Onset   Arthritis Other    Cancer Other        breast   Heart disease Other    Stroke Other    Mental illness Other        emotional   Diabetes Other     Review of Systems  Constitutional: Negative for fever.       Feels hot  HENT: Positive for congestion and voice change (raspy). Negative for ear pain, sinus pressure, sinus pain and sore throat.   Eyes: Negative  for visual disturbance.  Respiratory: Positive for cough (allergies), chest tightness and shortness of breath. Negative for wheezing.   Cardiovascular: Positive for palpitations and leg swelling (out of lasix x 2 weeks). Negative for chest pain (just tightness).  Gastrointestinal: Positive for nausea and vomiting.  Musculoskeletal: Positive for back pain (tightness in back between shoulder blades).  Neurological: Positive for dizziness, light-headedness and headaches (since this started). Negative for weakness and numbness (nothing new).       Objective:   Vitals:   03/20/20 1042  BP: (!) 148/82  Pulse: 76  Temp: 98.5 F (36.9 C)  SpO2: 97%   BP Readings from Last 3 Encounters:  03/20/20 (!) 148/82  03/09/20 (!) 154/83  04/05/19 140/90   Wt Readings from Last 3 Encounters:  04/05/19 (!) 370 lb (167.8 kg)  02/22/19 (!) 355 lb (161 kg)  01/11/19 (!) 358 lb (162.4 kg)   Body mass index is 50.18 kg/m.   Physical Exam Constitutional:      General: She is not in acute distress.    Appearance: Normal appearance. She is not ill-appearing.  HENT:     Head: Normocephalic and atraumatic.     Right Ear: Tympanic membrane, ear canal and external ear normal. There is no impacted cerumen.     Left Ear: Tympanic membrane, ear canal and external ear normal. There is no impacted cerumen.     Mouth/Throat:     Mouth: Mucous membranes are moist.     Pharynx: No oropharyngeal exudate or posterior oropharyngeal erythema.  Eyes:     Conjunctiva/sclera: Conjunctivae normal.  Neck:     Vascular: No carotid bruit.  Cardiovascular:     Rate and Rhythm: Normal rate. Rhythm  irregular.  Pulmonary:     Effort: Pulmonary effort is normal. No respiratory distress.     Breath sounds: No wheezing or rales.  Musculoskeletal:     Cervical back: Neck supple. No tenderness.     Right lower leg: Edema (1+) present.     Left lower leg: Edema (1+) present.  Lymphadenopathy:     Cervical: No cervical  adenopathy.  Skin:    General: Skin is warm and dry.  Neurological:     Mental Status: She is alert.     Cranial Nerves: No cranial nerve deficit.     Sensory: No sensory deficit.     Motor: No weakness.            Assessment & Plan:    See Problem List for Assessment and Plan of chronic medical problems.    This visit occurred during the SARS-CoV-2 public health emergency.  Safety protocols were in place, including screening questions prior to the visit, additional usage of staff PPE, and extensive cleaning of exam room while observing appropriate contact time as indicated for disinfecting solutions.

## 2020-03-20 ENCOUNTER — Ambulatory Visit (INDEPENDENT_AMBULATORY_CARE_PROVIDER_SITE_OTHER): Payer: Managed Care, Other (non HMO) | Admitting: Internal Medicine

## 2020-03-20 ENCOUNTER — Ambulatory Visit (INDEPENDENT_AMBULATORY_CARE_PROVIDER_SITE_OTHER): Payer: Managed Care, Other (non HMO)

## 2020-03-20 ENCOUNTER — Encounter: Payer: Self-pay | Admitting: Internal Medicine

## 2020-03-20 ENCOUNTER — Ambulatory Visit: Payer: Managed Care, Other (non HMO) | Admitting: Family

## 2020-03-20 VITALS — BP 148/82 | HR 76 | Temp 98.5°F | Ht 72.0 in

## 2020-03-20 DIAGNOSIS — I5033 Acute on chronic diastolic (congestive) heart failure: Secondary | ICD-10-CM

## 2020-03-20 DIAGNOSIS — R0602 Shortness of breath: Secondary | ICD-10-CM

## 2020-03-20 DIAGNOSIS — I5032 Chronic diastolic (congestive) heart failure: Secondary | ICD-10-CM

## 2020-03-20 DIAGNOSIS — Z23 Encounter for immunization: Secondary | ICD-10-CM | POA: Diagnosis not present

## 2020-03-20 DIAGNOSIS — R42 Dizziness and giddiness: Secondary | ICD-10-CM | POA: Insufficient documentation

## 2020-03-20 LAB — COMPREHENSIVE METABOLIC PANEL
ALT: 13 U/L (ref 0–35)
AST: 8 U/L (ref 0–37)
Albumin: 4 g/dL (ref 3.5–5.2)
Alkaline Phosphatase: 61 U/L (ref 39–117)
BUN: 17 mg/dL (ref 6–23)
CO2: 27 mEq/L (ref 19–32)
Calcium: 9.5 mg/dL (ref 8.4–10.5)
Chloride: 101 mEq/L (ref 96–112)
Creatinine, Ser: 1.55 mg/dL — ABNORMAL HIGH (ref 0.40–1.20)
GFR: 35.01 mL/min — ABNORMAL LOW (ref >60.00)
Glucose, Bld: 171 mg/dL — ABNORMAL HIGH (ref 70–99)
Potassium: 4.3 mEq/L (ref 3.5–5.1)
Sodium: 136 mEq/L (ref 135–145)
Total Bilirubin: 0.4 mg/dL (ref 0.2–1.2)
Total Protein: 7.2 g/dL (ref 6.0–8.3)

## 2020-03-20 LAB — CBC WITH DIFFERENTIAL/PLATELET
Basophils Absolute: 0.1 10*3/uL (ref 0.0–0.1)
Basophils Relative: 0.6 % (ref 0.0–3.0)
Eosinophils Absolute: 0.1 10*3/uL (ref 0.0–0.7)
Eosinophils Relative: 1.3 % (ref 0.0–5.0)
HCT: 36.9 % (ref 36.0–46.0)
Hemoglobin: 11.9 g/dL — ABNORMAL LOW (ref 12.0–15.0)
Lymphocytes Relative: 8.2 % — ABNORMAL LOW (ref 12.0–46.0)
Lymphs Abs: 0.8 10*3/uL (ref 0.7–4.0)
MCHC: 32.2 g/dL (ref 30.0–36.0)
MCV: 80.9 fl (ref 78.0–100.0)
Monocytes Absolute: 0.6 10*3/uL (ref 0.1–1.0)
Monocytes Relative: 6.5 % (ref 3.0–12.0)
Neutro Abs: 8 10*3/uL — ABNORMAL HIGH (ref 1.4–7.7)
Neutrophils Relative %: 83.4 % — ABNORMAL HIGH (ref 43.0–77.0)
Platelets: 242 10*3/uL (ref 150.0–400.0)
RBC: 4.56 Mil/uL (ref 3.87–5.11)
RDW: 16.8 % — ABNORMAL HIGH (ref 11.5–15.5)
WBC: 9.6 10*3/uL (ref 4.0–10.5)

## 2020-03-20 LAB — BRAIN NATRIURETIC PEPTIDE: Pro B Natriuretic peptide (BNP): 125 pg/mL — ABNORMAL HIGH (ref 0.0–100.0)

## 2020-03-20 MED ORDER — MECLIZINE HCL 12.5 MG PO TABS: 12.5000 mg | ORAL_TABLET | Freq: Three times a day (TID) | ORAL | 0 refills | Status: DC | PRN

## 2020-03-20 NOTE — Addendum Note (Signed)
Addended by: Karma Ganja on: 03/20/2020 02:21 PM   Modules accepted: Orders

## 2020-03-20 NOTE — Assessment & Plan Note (Signed)
Acute Likely related to acute on chronic diastolic heart failure from not being on her Lasix for the past 2 weeks, but does get frequent bronchitis.  Cough is not worse than usual, but will check chest x-ray, CBC, CMP, BNP She will be restarting her Lasix hopefully today if it came in the mail, which will likely improve her shortness of breath

## 2020-03-20 NOTE — Assessment & Plan Note (Signed)
New Likely BPPV-worse when she sits up from bed She has been taking her Xarelto religiously so this is unlikely a stroke from her chronic A. fib. Concerning neurological symptoms and exam is nonfocal Discussed etiology No obvious URI or sinus infection Meclizine 12.5 mg 3 times daily as needed-advised if this makes her too drowsy she can try nondrowsy Dramamine Discussed that we may need to consider vestibular PT Call if no improvement Will likely require FMLA paperwork due to missed work

## 2020-03-20 NOTE — Patient Instructions (Addendum)
Flu immunization administered today.    Blood work was ordered.  Have a chest xray downstairs.    Medications reviewed and updated.  Changes include :   meclizine three times a day as needed for dizziness.  Restart the lasix.      Your prescription(s) have been submitted to your pharmacy. Please take as directed and contact our office if you believe you are having problem(s) with the medication(s).

## 2020-03-20 NOTE — Assessment & Plan Note (Signed)
Acute Her chest tightness, increased shortness of breath and increased leg swelling are consistent with acute on chronic diastolic heart failure She has been out of her Lasix for 2 weeks and should be able to restart today-waiting for prescription and mail Will check CMP, BNP and chest x-ray

## 2020-04-04 ENCOUNTER — Other Ambulatory Visit: Payer: Self-pay | Admitting: Internal Medicine

## 2020-04-04 NOTE — Telephone Encounter (Signed)
Please refill as per office routine med refill policy (all routine meds refilled for 3 mo or monthly per pt preference up to one year from last visit, then month to month grace period for 3 mo, then further med refills will have to be denied)  

## 2020-04-13 ENCOUNTER — Telehealth: Payer: Self-pay | Admitting: Internal Medicine

## 2020-04-13 NOTE — Telephone Encounter (Addendum)
   Patient calling ,[employer] states they are missing information for FMLA claim They are requesting the last office note/ medical record

## 2020-04-13 NOTE — Telephone Encounter (Signed)
Spoke with patient to see what forms she is speaking of.  We never received forms, She never gave them the fax number.  Patient is going to follow up and have them fax over forms.   Saw Dr.Burns on 03/20/20 (Dr.John patient) Requesting leave, Start date 03/09/20 Return to work on 04/09/2020. Dr.Burns it does start in your notes patient may request FMLA, But no dates on how long.  Are you okay with signing these forms?

## 2020-04-13 NOTE — Telephone Encounter (Signed)
Okay with signing

## 2020-04-15 ENCOUNTER — Other Ambulatory Visit: Payer: Self-pay | Admitting: Internal Medicine

## 2020-04-15 NOTE — Telephone Encounter (Signed)
Please refill as per office routine med refill policy (all routine meds refilled for 3 mo or monthly per pt preference up to one year from last visit, then month to month grace period for 3 mo, then further med refills will have to be denied)  

## 2020-04-21 ENCOUNTER — Other Ambulatory Visit (HOSPITAL_COMMUNITY): Payer: Self-pay | Admitting: Cardiology

## 2020-04-21 ENCOUNTER — Other Ambulatory Visit: Payer: Self-pay | Admitting: Internal Medicine

## 2020-04-22 NOTE — Telephone Encounter (Signed)
Please refill as per office routine med refill policy (all routine meds refilled for 3 mo or monthly per pt preference up to one year from last visit, then month to month grace period for 3 mo, then further med refills will have to be denied)  

## 2020-04-23 DIAGNOSIS — Z0279 Encounter for issue of other medical certificate: Secondary | ICD-10-CM

## 2020-04-23 NOTE — Telephone Encounter (Signed)
Forms have been received.  They have been completed and Placed in providers box to review and sign.

## 2020-04-24 ENCOUNTER — Other Ambulatory Visit: Payer: Self-pay | Admitting: Internal Medicine

## 2020-04-24 NOTE — Telephone Encounter (Signed)
Please refill as per office routine med refill policy (all routine meds refilled for 3 mo or monthly per pt preference up to one year from last visit, then month to month grace period for 3 mo, then further med refills will have to be denied)  

## 2020-04-24 NOTE — Telephone Encounter (Signed)
Forms have been signed, Faxed, sent to scan &Charged for.   LVM to inform and original mailed to patient.

## 2020-05-07 ENCOUNTER — Other Ambulatory Visit: Payer: Self-pay | Admitting: Internal Medicine

## 2020-05-15 ENCOUNTER — Other Ambulatory Visit: Payer: Self-pay | Admitting: Internal Medicine

## 2020-05-15 NOTE — Telephone Encounter (Signed)
Please refill as per office routine med refill policy (all routine meds refilled for 3 mo or monthly per pt preference up to one year from last visit, then month to month grace period for 3 mo, then further med refills will have to be denied)  

## 2020-05-18 ENCOUNTER — Other Ambulatory Visit (HOSPITAL_COMMUNITY): Payer: Self-pay

## 2020-05-18 MED ORDER — RIVAROXABAN 20 MG PO TABS: 20.0000 mg | ORAL_TABLET | Freq: Every day | ORAL | 0 refills | Status: DC

## 2020-05-18 NOTE — Telephone Encounter (Signed)
Tried to get patient to schedule an appointment but she said she would have to look at her schedule first

## 2020-05-20 ENCOUNTER — Other Ambulatory Visit: Payer: Self-pay | Admitting: Internal Medicine

## 2020-05-20 NOTE — Telephone Encounter (Signed)
Please refill as per office routine med refill policy (all routine meds refilled for 3 mo or monthly per pt preference up to one year from last visit, then month to month grace period for 3 mo, then further med refills will have to be denied)  

## 2020-05-22 ENCOUNTER — Other Ambulatory Visit (HOSPITAL_COMMUNITY): Payer: Self-pay | Admitting: *Deleted

## 2020-05-22 MED ORDER — SPIRONOLACTONE 25 MG PO TABS: 25.0000 mg | ORAL_TABLET | Freq: Every day | ORAL | 0 refills | Status: DC

## 2020-05-22 MED ORDER — RIVAROXABAN 20 MG PO TABS
20.0000 mg | ORAL_TABLET | Freq: Every day | ORAL | 0 refills | Status: DC
Start: 2020-05-22 — End: 2020-06-21

## 2020-05-25 ENCOUNTER — Other Ambulatory Visit (HOSPITAL_COMMUNITY): Payer: Self-pay | Admitting: Cardiology

## 2020-05-25 ENCOUNTER — Other Ambulatory Visit: Payer: Self-pay | Admitting: Internal Medicine

## 2020-05-25 NOTE — Telephone Encounter (Signed)
Please refill as per office routine med refill policy (all routine meds refilled for 3 mo or monthly per pt preference up to one year from last visit, then month to month grace period for 3 mo, then further med refills will have to be denied)  

## 2020-05-27 ENCOUNTER — Other Ambulatory Visit: Payer: Self-pay | Admitting: Internal Medicine

## 2020-05-27 NOTE — Telephone Encounter (Signed)
Please refill as per office routine med refill policy (all routine meds refilled for 3 mo or monthly per pt preference up to one year from last visit, then month to month grace period for 3 mo, then further med refills will have to be denied)  

## 2020-05-29 ENCOUNTER — Encounter (HOSPITAL_COMMUNITY): Payer: Self-pay | Admitting: Cardiology

## 2020-06-09 ENCOUNTER — Other Ambulatory Visit: Payer: Self-pay | Admitting: Internal Medicine

## 2020-06-09 NOTE — Telephone Encounter (Signed)
Please refill as per office routine med refill policy (all routine meds refilled for 3 mo or monthly per pt preference up to one year from last visit, then month to month grace period for 3 mo, then further med refills will have to be denied)  

## 2020-06-20 ENCOUNTER — Other Ambulatory Visit: Payer: Self-pay

## 2020-06-20 ENCOUNTER — Ambulatory Visit: Payer: Managed Care, Other (non HMO) | Admitting: Internal Medicine

## 2020-06-21 ENCOUNTER — Ambulatory Visit: Payer: Managed Care, Other (non HMO) | Admitting: Internal Medicine

## 2020-06-21 ENCOUNTER — Encounter: Payer: Self-pay | Admitting: Internal Medicine

## 2020-06-21 VITALS — BP 120/78 | HR 113 | Temp 98.5°F

## 2020-06-21 DIAGNOSIS — I10 Essential (primary) hypertension: Secondary | ICD-10-CM

## 2020-06-21 DIAGNOSIS — E559 Vitamin D deficiency, unspecified: Secondary | ICD-10-CM | POA: Diagnosis not present

## 2020-06-21 DIAGNOSIS — E538 Deficiency of other specified B group vitamins: Secondary | ICD-10-CM

## 2020-06-21 DIAGNOSIS — I5032 Chronic diastolic (congestive) heart failure: Secondary | ICD-10-CM | POA: Diagnosis not present

## 2020-06-21 DIAGNOSIS — F411 Generalized anxiety disorder: Secondary | ICD-10-CM

## 2020-06-21 DIAGNOSIS — Z114 Encounter for screening for human immunodeficiency virus [HIV]: Secondary | ICD-10-CM

## 2020-06-21 DIAGNOSIS — F32A Depression, unspecified: Secondary | ICD-10-CM

## 2020-06-21 DIAGNOSIS — R42 Dizziness and giddiness: Secondary | ICD-10-CM

## 2020-06-21 DIAGNOSIS — N1831 Chronic kidney disease, stage 3a: Secondary | ICD-10-CM

## 2020-06-21 DIAGNOSIS — E7849 Other hyperlipidemia: Secondary | ICD-10-CM

## 2020-06-21 DIAGNOSIS — E11610 Type 2 diabetes mellitus with diabetic neuropathic arthropathy: Secondary | ICD-10-CM

## 2020-06-21 DIAGNOSIS — Z0001 Encounter for general adult medical examination with abnormal findings: Secondary | ICD-10-CM

## 2020-06-21 DIAGNOSIS — Z0000 Encounter for general adult medical examination without abnormal findings: Secondary | ICD-10-CM

## 2020-06-21 DIAGNOSIS — R5381 Other malaise: Secondary | ICD-10-CM

## 2020-06-21 LAB — LIPID PANEL
Cholesterol: 191 mg/dL (ref 0–200)
HDL: 63.9 mg/dL (ref >39.00)
LDL Cholesterol: 105 mg/dL — ABNORMAL HIGH (ref 0–99)
NonHDL: 127.35
Total CHOL/HDL Ratio: 3
Triglycerides: 110 mg/dL (ref 0.0–149.0)
VLDL: 22 mg/dL (ref 0.0–40.0)

## 2020-06-21 LAB — VITAMIN B12: Vitamin B-12: 217 pg/mL (ref 211–911)

## 2020-06-21 LAB — BASIC METABOLIC PANEL
BUN: 33 mg/dL — ABNORMAL HIGH (ref 6–23)
CO2: 21 mEq/L (ref 19–32)
Calcium: 9.8 mg/dL (ref 8.4–10.5)
Chloride: 100 mEq/L (ref 96–112)
Creatinine, Ser: 1.79 mg/dL — ABNORMAL HIGH (ref 0.40–1.20)
GFR: 29.65 mL/min — ABNORMAL LOW (ref >60.00)
Glucose, Bld: 257 mg/dL — ABNORMAL HIGH (ref 70–99)
Potassium: 4.3 mEq/L (ref 3.5–5.1)
Sodium: 134 mEq/L — ABNORMAL LOW (ref 135–145)

## 2020-06-21 LAB — URINALYSIS, ROUTINE W REFLEX MICROSCOPIC
Bilirubin Urine: NEGATIVE
Hgb urine dipstick: NEGATIVE
Ketones, ur: NEGATIVE
Leukocytes,Ua: NEGATIVE
Nitrite: NEGATIVE
RBC / HPF: NONE SEEN (ref 0–?)
Specific Gravity, Urine: 1.01 (ref 1.000–1.030)
Total Protein, Urine: NEGATIVE
Urine Glucose: >=1000 — AB
Urobilinogen, UA: 0.2 (ref 0.0–1.0)
pH: 5.5 (ref 5.0–8.0)

## 2020-06-21 LAB — CBC WITH DIFFERENTIAL/PLATELET
Basophils Absolute: 0.1 10*3/uL (ref 0.0–0.1)
Basophils Relative: 0.5 % (ref 0.0–3.0)
Eosinophils Absolute: 0.1 10*3/uL (ref 0.0–0.7)
Eosinophils Relative: 1 % (ref 0.0–5.0)
HCT: 40.4 % (ref 36.0–46.0)
Hemoglobin: 12.6 g/dL (ref 12.0–15.0)
Lymphocytes Relative: 9.4 % — ABNORMAL LOW (ref 12.0–46.0)
Lymphs Abs: 1.2 10*3/uL (ref 0.7–4.0)
MCHC: 31.3 g/dL (ref 30.0–36.0)
MCV: 80 fl (ref 78.0–100.0)
Monocytes Absolute: 0.7 10*3/uL (ref 0.1–1.0)
Monocytes Relative: 5.8 % (ref 3.0–12.0)
Neutro Abs: 10.7 10*3/uL — ABNORMAL HIGH (ref 1.4–7.7)
Neutrophils Relative %: 83.3 % — ABNORMAL HIGH (ref 43.0–77.0)
Platelets: 303 10*3/uL (ref 150.0–400.0)
RBC: 5.05 Mil/uL (ref 3.87–5.11)
RDW: 16.8 % — ABNORMAL HIGH (ref 11.5–15.5)
WBC: 12.9 10*3/uL — ABNORMAL HIGH (ref 4.0–10.5)

## 2020-06-21 LAB — TSH: TSH: 1.98 u[IU]/mL (ref 0.35–4.50)

## 2020-06-21 LAB — HEPATIC FUNCTION PANEL
ALT: 12 U/L (ref 0–35)
AST: 9 U/L (ref 0–37)
Albumin: 4.4 g/dL (ref 3.5–5.2)
Alkaline Phosphatase: 61 U/L (ref 39–117)
Bilirubin, Direct: 0.1 mg/dL (ref 0.0–0.3)
Total Bilirubin: 0.4 mg/dL (ref 0.2–1.2)
Total Protein: 7.8 g/dL (ref 6.0–8.3)

## 2020-06-21 LAB — BRAIN NATRIURETIC PEPTIDE: Pro B Natriuretic peptide (BNP): 139 pg/mL — ABNORMAL HIGH (ref 0.0–100.0)

## 2020-06-21 LAB — MICROALBUMIN / CREATININE URINE RATIO
Creatinine,U: 24.8 mg/dL
Microalb Creat Ratio: 18 mg/g (ref 0.0–30.0)
Microalb, Ur: 4.5 mg/dL — ABNORMAL HIGH (ref 0.0–1.9)

## 2020-06-21 LAB — PHOSPHORUS: Phosphorus: 3.5 mg/dL (ref 2.3–4.6)

## 2020-06-21 LAB — HEMOGLOBIN A1C: Hgb A1c MFr Bld: 9.9 % — ABNORMAL HIGH (ref 4.6–6.5)

## 2020-06-21 LAB — VITAMIN D 25 HYDROXY (VIT D DEFICIENCY, FRACTURES): VITD: 15.67 ng/mL — ABNORMAL LOW (ref 30.00–100.00)

## 2020-06-21 MED ORDER — POTASSIUM CHLORIDE CRYS ER 20 MEQ PO TBCR
20.0000 meq | EXTENDED_RELEASE_TABLET | Freq: Two times a day (BID) | ORAL | 3 refills | Status: DC
Start: 2020-06-21 — End: 2020-11-24

## 2020-06-21 MED ORDER — PREGABALIN 50 MG PO CAPS
ORAL_CAPSULE | ORAL | 1 refills | Status: DC
Start: 2020-06-21 — End: 2021-04-05

## 2020-06-21 MED ORDER — FUROSEMIDE 40 MG PO TABS
40.0000 mg | ORAL_TABLET | Freq: Every day | ORAL | 3 refills | Status: DC
Start: 2020-06-21 — End: 2020-09-06

## 2020-06-21 MED ORDER — SPIRONOLACTONE 25 MG PO TABS
ORAL_TABLET | ORAL | 3 refills | Status: DC
Start: 2020-06-21 — End: 2020-11-24

## 2020-06-21 MED ORDER — LOSARTAN POTASSIUM 50 MG PO TABS
50.0000 mg | ORAL_TABLET | Freq: Every day | ORAL | 3 refills | Status: DC
Start: 2020-06-21 — End: 2020-11-24

## 2020-06-21 MED ORDER — ALBUTEROL SULFATE HFA 108 (90 BASE) MCG/ACT IN AERS: 2.0000 | INHALATION_SPRAY | Freq: Four times a day (QID) | RESPIRATORY_TRACT | 0 refills | Status: AC | PRN

## 2020-06-21 MED ORDER — METFORMIN HCL 1000 MG PO TABS
ORAL_TABLET | ORAL | 3 refills | Status: DC
Start: 2020-06-21 — End: 2020-11-24

## 2020-06-21 MED ORDER — CETIRIZINE HCL 10 MG PO TABS: ORAL_TABLET | ORAL | 3 refills | Status: AC

## 2020-06-21 MED ORDER — METOPROLOL SUCCINATE ER 100 MG PO TB24: 100.0000 mg | ORAL_TABLET | Freq: Every day | ORAL | 3 refills | Status: AC

## 2020-06-21 MED ORDER — RIVAROXABAN 20 MG PO TABS
20.0000 mg | ORAL_TABLET | Freq: Every day | ORAL | 3 refills | Status: DC
Start: 2020-06-21 — End: 2020-08-17

## 2020-06-21 MED ORDER — PIOGLITAZONE HCL 15 MG PO TABS
ORAL_TABLET | ORAL | 3 refills | Status: DC
Start: 2020-06-21 — End: 2020-06-24

## 2020-06-21 MED ORDER — MECLIZINE HCL 12.5 MG PO TABS: 12.5000 mg | ORAL_TABLET | Freq: Three times a day (TID) | ORAL | 0 refills | Status: DC | PRN

## 2020-06-21 MED ORDER — EMPAGLIFLOZIN 25 MG PO TABS
ORAL_TABLET | ORAL | 3 refills | Status: DC
Start: 2020-06-21 — End: 2021-03-19

## 2020-06-21 MED ORDER — SPIRONOLACTONE 25 MG PO TABS
ORAL_TABLET | ORAL | 0 refills | Status: DC
Start: 2020-06-21 — End: 2020-06-21

## 2020-06-21 MED ORDER — DILTIAZEM HCL ER COATED BEADS 360 MG PO CP24
ORAL_CAPSULE | ORAL | 3 refills | Status: DC
Start: 2020-06-21 — End: 2021-03-19

## 2020-06-21 MED ORDER — ATORVASTATIN CALCIUM 40 MG PO TABS
ORAL_TABLET | ORAL | 3 refills | Status: DC
Start: 2020-06-21 — End: 2021-03-19

## 2020-06-21 MED ORDER — GLIPIZIDE ER 10 MG PO TB24
ORAL_TABLET | ORAL | 3 refills | Status: DC
Start: 2020-06-21 — End: 2021-03-19

## 2020-06-21 NOTE — Progress Notes (Addendum)
Established Patient Office Visit  Subjective:  Patient ID: Pamela [name], female    DOB: [birth year]  Age: 65 y.o. MRN: [id number]       Chief Complaint:: wellness exam and Medication Refill and Dizziness (Since inner ear infection)       HPI:  Pamela [name] is a 65 y.o. female here for wellness exam , but declines most health measures today  Wt Readings from Last 3 Encounters:  04/05/19 (!) 370 lb (167.8 kg)  02/22/19 (!) 355 lb (161 kg)  01/11/19 (!) 358 lb (162.4 kg)   BP Readings from Last 3 Encounters:  06/21/20 120/78  03/20/20 (!) 148/82  03/09/20 (!) 154/83        Also c/o acute problem of:  Unfortunately conts to have intemittent vertigo dizziness mild to mod, without worsening ear pain, HA, n/v, fever, ST or cough.  Did have a fall and went to ED oct 1 and labs essentially ok.  Did f/u with Dr Pamela [name] oct 12 - CXR - NAD, and asked to restart her lasix that had been stopped.  Today, Pt denies chest pain, increased sob or doe, wheezing, orthopnea, PND, increased LE swelling, palpitations, dizziness or syncope, though has some ongoing chronic leg welling no change.  Has had wt gain but feels more fat related due to worsening activity with the dizziness, now with worsening ambulation as well, hard to stand up and walk, does not feel she needs the cane yet, has not been taking the meclizine recently, does not feel she needs PT and declines referral, though does ask for handicapped parking application signed.  Also c/o insomnia where difficult to get to sleep most night, tends to wake up at 3-4 am and cant get back to sleep, but then declines sleep med as well.  Denies worsening depressive symptoms, suicidal ideation, or panic; has ongoing anxiety  Past Medical History:  Diagnosis Date  . ALLERGIC RHINITIS 12/31/2007  . ANXIETY 12/31/2007  . Atrial fibrillation (HCC)   . DEPRESSION 12/31/2007  . DIABETES MELLITUS, TYPE II 12/31/2007  . Diastolic dysfunction 10/11/2010  . Hyperlipidemia  04/23/2012  . HYPERTENSION 12/31/2007  . Migraine   . PNA (pneumonia)    in her 2620s  . Right knee DJD   . Rosacea 10/11/2010   Past Surgical History:  Procedure Laterality Date  . 2 D&C     1980s    reports that she has never smoked. She has never used smokeless tobacco. She reports current alcohol use. She reports that she does not use drugs. family history includes Arthritis in an other family member; Cancer in an other family member; Diabetes in an other family member; Heart disease in an other family member; Mental illness in an other family member; Stroke in an other family member. Allergies  Allergen Reactions  . Cymbalta [Duloxetine Hcl]     "loopiness"  . Gabapentin Itching   Current Outpatient Medications on File Prior to Visit  Medication Sig Dispense Refill  . Omega-3 Fatty Acids (FISH OIL) 1000 MG CAPS Take 2 capsules (2,000 mg total) by mouth 2 (two) times daily. 60 capsule 0  . cyclobenzaprine (FLEXERIL) 5 MG tablet Take 1 tablet (5 mg total) by mouth 3 (three) times daily as needed for muscle spasms. (Patient not taking: Reported on 06/21/2020) 60 tablet 1  . traMADol (ULTRAM) 50 MG tablet Take 1 tablet (50 mg total) by mouth every 6 (six) hours as needed. (Patient not taking: Reported on 06/21/2020) 30 tablet  0   No current facility-administered medications on file prior to visit.        ROS:  All others reviewed and negative.  Objective        PE:  BP 120/78 (BP Location: Left Arm, Patient Position: Sitting, Cuff Size: Large)   Pulse (!) 113   Temp 98.5 F (36.9 C) (Oral)   LMP 09/08/2010 (Exact Date)   SpO2 96%                 Constitutional: Pt appears in NAD               HENT: Head: NCAT.                Right Ear: External ear normal.                 Left Ear: External ear normal.                Eyes: . Pupils are equal, round, and reactive to light. Conjunctivae and EOM are normal               Nose: without d/c or deformity               Neck: Neck  supple. Gross normal ROM               Cardiovascular: Normal rate and regular rhythm.                 Pulmonary/Chest: Effort normal and breath sounds without rales or wheezing.                Abd:  Soft, NT, ND, + BS, no organomegaly               Neurological: Pt is alert. At baseline orientation, motor grossly intact               Skin: Skin is warm. No rashes, no other new lesions, LE edema -1+ chronic appearing               Psychiatric: Pt behavior is normal without agitation   Assessment/Plan:  Pamela [name] is a 65 y.o. White or Caucasian [1] female with  has a past medical history of ALLERGIC RHINITIS (12/31/2007), ANXIETY (12/31/2007), Atrial fibrillation (HCC), DEPRESSION (12/31/2007), DIABETES MELLITUS, TYPE II (12/31/2007), Diastolic dysfunction (10/11/2010), Hyperlipidemia (04/23/2012), HYPERTENSION (12/31/2007), Migraine, PNA (pneumonia), Right knee DJD, and Rosacea (10/11/2010).    Assessment Plan  See notes Labs/data reviewed for each problem:  Micro: none  Cardiac tracings I have personally interpreted today:  none  Pertinent Radiological findings (summarize): Mar 20, 2020 cxr     Health Maintenance Due  Topic Date Due  . OPHTHALMOLOGY EXAM  10/13/2018    There are no preventive care reminders to display for this patient.  Lab Results  Component Value Date   TSH 1.98 06/21/2020   Lab Results  Component Value Date   WBC 12.9 (H) 06/21/2020   HGB 12.6 06/21/2020   HCT 40.4 06/21/2020   MCV 80.0 06/21/2020   PLT 303.0 06/21/2020   Lab Results  Component Value Date   NA 134 (L) 06/21/2020   K 4.3 06/21/2020   CO2 21 06/21/2020   GLUCOSE 257 (H) 06/21/2020   BUN 33 (H) 06/21/2020   CREATININE 1.79 (H) 06/21/2020   BILITOT 0.4 06/21/2020   ALKPHOS 61 06/21/2020   AST 9 06/21/2020   ALT 12 06/21/2020   PROT 7.8 06/21/2020  ALBUMIN 4.4 06/21/2020   CALCIUM 9.8 06/21/2020   ANIONGAP 11 03/09/2020   GFR 29.65 (L) 06/21/2020   Lab Results  Component  Value Date   CHOL 191 06/21/2020   Lab Results  Component Value Date   HDL 63.90 06/21/2020   Lab Results  Component Value Date   LDLCALC 105 (H) 06/21/2020   Lab Results  Component Value Date   TRIG 110.0 06/21/2020   Lab Results  Component Value Date   CHOLHDL 3 06/21/2020   Lab Results  Component Value Date   HGBA1C 9.9 (H) 06/21/2020      Assessment & Plan:   Problem List Items Addressed This Visit      High   Chronic diastolic heart failure (HCC)    Overall stable, for BNP with labs      Relevant Medications   atorvastatin (LIPITOR) 40 MG tablet   diltiazem (CARDIZEM CD) 360 MG 24 hr capsule   furosemide (LASIX) 40 MG tablet   losartan (COZAAR) 50 MG tablet   metoprolol succinate (TOPROL-XL) 100 MG 24 hr tablet   rivaroxaban (XARELTO) 20 MG TABS tablet   spironolactone (ALDACTONE) 25 MG tablet   Other Relevant Orders   Brain natriuretic peptide (Completed)     Medium   Vertigo    Ok for meclize refill, encourage to use prn, declines cns imaging or neuro referral      Hyperlipidemia    Lab Results  Component Value Date   LDLCALC 105 (H) 06/21/2020   Stable, pt to continue current statin lipitor 40 - declines med change       Relevant Medications   atorvastatin (LIPITOR) 40 MG tablet   diltiazem (CARDIZEM CD) 360 MG 24 hr capsule   furosemide (LASIX) 40 MG tablet   losartan (COZAAR) 50 MG tablet   metoprolol succinate (TOPROL-XL) 100 MG 24 hr tablet   rivaroxaban (XARELTO) 20 MG TABS tablet   spironolactone (ALDACTONE) 25 MG tablet   Essential hypertension    BP Readings from Last 3 Encounters:  06/21/20 120/78  03/20/20 (!) 148/82  03/09/20 (!) 154/83   Stable, pt to continue medical treatment  - losartan ,toprol, lasix, aldactone  Current Outpatient Medications (Endocrine & Metabolic):  .  empagliflozin (JARDIANCE) 25 MG TABS tablet, TAKE 1 TABLET BY MOUTH DAILY. Marland Kitchen.  glipiZIDE (GLUCOTROL XL) 10 MG 24 hr tablet, 1 tab by mouth once  daily .  metFORMIN (GLUCOPHAGE) 1000 MG tablet, 1 tab by mouth twice daily .  pioglitazone (ACTOS) 30 MG tablet, Take 1 tablet (30 mg total) by mouth daily.  Current Outpatient Medications (Cardiovascular):  .  atorvastatin (LIPITOR) 40 MG tablet, TAKE 1 TABLET(40 MG) BY MOUTH DAILY .  diltiazem (CARDIZEM CD) 360 MG 24 hr capsule, TAKE 1 CAPSULE(360 MG) BY MOUTH DAILY .  furosemide (LASIX) 40 MG tablet, Take 1 tablet (40 mg total) by mouth daily. Marland Kitchen.  losartan (COZAAR) 50 MG tablet, Take 1 tablet (50 mg total) by mouth daily. Needs appt for further refills .  metoprolol succinate (TOPROL-XL) 100 MG 24 hr tablet, Take 1 tablet (100 mg total) by mouth daily. Take with or immediately following a meal. .  spironolactone (ALDACTONE) 25 MG tablet, TAKE 1 TABLET(25 MG) BY MOUTH DAILY  Current Outpatient Medications (Respiratory):  .  albuterol (VENTOLIN HFA) 108 (90 Base) MCG/ACT inhaler, Inhale 2 puffs into the lungs every 6 (six) hours as needed for wheezing or shortness of breath. .  cetirizine (ZYRTEC) 10 MG tablet, 1 tab by  mouth once daily as needed  Current Outpatient Medications (Analgesics):  .  traMADol (ULTRAM) 50 MG tablet, Take 1 tablet (50 mg total) by mouth every 6 (six) hours as needed. (Patient not taking: Reported on 06/21/2020)  Current Outpatient Medications (Hematological):  .  rivaroxaban (XARELTO) 20 MG TABS tablet, Take 1 tablet (20 mg total) by mouth daily with supper.  Current Outpatient Medications (Other):  Marland Kitchen  Omega-3 Fatty Acids (FISH OIL) 1000 MG CAPS, Take 2 capsules (2,000 mg total) by mouth 2 (two) times daily. .  Cholecalciferol (THERA-D 2000) 50 MCG (2000 UT) TABS, 1 tab by mouth once daily .  cyclobenzaprine (FLEXERIL) 5 MG tablet, Take 1 tablet (5 mg total) by mouth 3 (three) times daily as needed for muscle spasms. (Patient not taking: Reported on 06/21/2020) .  meclizine (ANTIVERT) 12.5 MG tablet, Take 1 tablet (12.5 mg total) by mouth 3 (three) times daily as  needed for dizziness. .  potassium chloride SA (KLOR-CON) 20 MEQ tablet, Take 1 tablet (20 mEq total) by mouth 2 (two) times daily. .  pregabalin (LYRICA) 50 MG capsule, TAKE 1 CAPSULE(50 MG) BY MOUTH THREE TIMES DAILY       Relevant Medications   atorvastatin (LIPITOR) 40 MG tablet   diltiazem (CARDIZEM CD) 360 MG 24 hr capsule   furosemide (LASIX) 40 MG tablet   losartan (COZAAR) 50 MG tablet   metoprolol succinate (TOPROL-XL) 100 MG 24 hr tablet   rivaroxaban (XARELTO) 20 MG TABS tablet   spironolactone (ALDACTONE) 25 MG tablet   Diabetes (HCC)    ? Control - for a1c   O/w Stable, pt to continue current medical treatment jardiacne, glucotrol, metformin, actos  Current Outpatient Medications (Endocrine & Metabolic):  .  empagliflozin (JARDIANCE) 25 MG TABS tablet, TAKE 1 TABLET BY MOUTH DAILY. Marland Kitchen  glipiZIDE (GLUCOTROL XL) 10 MG 24 hr tablet, 1 tab by mouth once daily .  metFORMIN (GLUCOPHAGE) 1000 MG tablet, 1 tab by mouth twice daily .  pioglitazone (ACTOS) 30 MG tablet, Take 1 tablet (30 mg total) by mouth daily.  Current Outpatient Medications (Cardiovascular):  .  atorvastatin (LIPITOR) 40 MG tablet, TAKE 1 TABLET(40 MG) BY MOUTH DAILY .  diltiazem (CARDIZEM CD) 360 MG 24 hr capsule, TAKE 1 CAPSULE(360 MG) BY MOUTH DAILY .  furosemide (LASIX) 40 MG tablet, Take 1 tablet (40 mg total) by mouth daily. Marland Kitchen  losartan (COZAAR) 50 MG tablet, Take 1 tablet (50 mg total) by mouth daily. Needs appt for further refills .  metoprolol succinate (TOPROL-XL) 100 MG 24 hr tablet, Take 1 tablet (100 mg total) by mouth daily. Take with or immediately following a meal. .  spironolactone (ALDACTONE) 25 MG tablet, TAKE 1 TABLET(25 MG) BY MOUTH DAILY  Current Outpatient Medications (Respiratory):  .  albuterol (VENTOLIN HFA) 108 (90 Base) MCG/ACT inhaler, Inhale 2 puffs into the lungs every 6 (six) hours as needed for wheezing or shortness of breath. .  cetirizine (ZYRTEC) 10 MG tablet, 1 tab by  mouth once daily as needed  Current Outpatient Medications (Analgesics):  .  traMADol (ULTRAM) 50 MG tablet, Take 1 tablet (50 mg total) by mouth every 6 (six) hours as needed. (Patient not taking: Reported on 06/21/2020)  Current Outpatient Medications (Hematological):  .  rivaroxaban (XARELTO) 20 MG TABS tablet, Take 1 tablet (20 mg total) by mouth daily with supper.  Current Outpatient Medications (Other):  Marland Kitchen  Omega-3 Fatty Acids (FISH OIL) 1000 MG CAPS, Take 2 capsules (2,000 mg total) by  mouth 2 (two) times daily. .  Cholecalciferol (THERA-D 2000) 50 MCG (2000 UT) TABS, 1 tab by mouth once daily .  cyclobenzaprine (FLEXERIL) 5 MG tablet, Take 1 tablet (5 mg total) by mouth 3 (three) times daily as needed for muscle spasms. (Patient not taking: Reported on 06/21/2020) .  meclizine (ANTIVERT) 12.5 MG tablet, Take 1 tablet (12.5 mg total) by mouth 3 (three) times daily as needed for dizziness. .  potassium chloride SA (KLOR-CON) 20 MEQ tablet, Take 1 tablet (20 mEq total) by mouth 2 (two) times daily. .  pregabalin (LYRICA) 50 MG capsule, TAKE 1 CAPSULE(50 MG) BY MOUTH THREE TIMES DAILY       Relevant Medications   atorvastatin (LIPITOR) 40 MG tablet   glipiZIDE (GLUCOTROL XL) 10 MG 24 hr tablet   empagliflozin (JARDIANCE) 25 MG TABS tablet   losartan (COZAAR) 50 MG tablet   metFORMIN (GLUCOPHAGE) 1000 MG tablet   Other Relevant Orders   Microalbumin / creatinine urine ratio (Completed)   Hemoglobin A1c (Completed)   Lipid panel (Completed)   Hepatic function panel (Completed)   CBC with Differential/Platelet (Completed)   TSH (Completed)   Urinalysis, Routine w reflex microscopic (Completed)   Basic metabolic panel (Completed)   Depression    Suspect worsening recent, declines med tx change for now, cont to follow, denies SI or HI or need for referral      CKD (chronic kidney disease) stage 3, GFR 30-59 ml/min (HCC)    Lab Results  Component Value Date   CREATININE 1.79 (H)  06/21/2020   Stable overall, cont to avoid nephrotoxins       Relevant Orders   PTH, intact and calcium   Phosphorus (Completed)     Low   Debility    Encouraged to consider cane use to avoid further falls, declines outpt PT referral, and handicapped parking application signed        Unprioritized   Encounter for preventative adult health care exam with abnormal findings - Primary    Overall doing well, age appropriate education and counseling updated, referrals for preventative services and immunizations addressed, dietary and smoking counseling addressed, most recent labs reviewed.  I have personally reviewed and have noted:  1) the patient's medical and social history 2) The pt's use of alcohol, tobacco, and illicit drugs 3) The patient's current medications and supplements 4) Functional ability including ADL's, fall risk, home safety risk, hearing and visual impairment 5) Diet and physical activities 6) Evidence for depression or mood disorder 7) The patient's height, weight, and BMI have been recorded in the chart  I have made referrals, and provided counseling and education based on review of the above        Other Visit Diagnoses    B12 deficiency       Relevant Orders   Vitamin B12 (Completed)   Vitamin D deficiency       Relevant Orders   VITAMIN D 25 Hydroxy (Vit-D Deficiency, Fractures) (Completed)   Encounter for screening for HIV       Relevant Orders   HIV Antibody (routine testing w rflx)      Meds ordered this encounter  Medications  . albuterol (VENTOLIN HFA) 108 (90 Base) MCG/ACT inhaler    Sig: Inhale 2 puffs into the lungs every 6 (six) hours as needed for wheezing or shortness of breath.    Dispense:  1 each    Refill:  0  . atorvastatin (LIPITOR) 40 MG tablet  Sig: TAKE 1 TABLET(40 MG) BY MOUTH DAILY    Dispense:  90 tablet    Refill:  3  . cetirizine (ZYRTEC) 10 MG tablet    Sig: 1 tab by mouth once daily as needed    Dispense:  90  tablet    Refill:  3  . diltiazem (CARDIZEM CD) 360 MG 24 hr capsule    Sig: TAKE 1 CAPSULE(360 MG) BY MOUTH DAILY    Dispense:  90 capsule    Refill:  3  . furosemide (LASIX) 40 MG tablet    Sig: Take 1 tablet (40 mg total) by mouth daily.    Dispense:  90 tablet    Refill:  3  . glipiZIDE (GLUCOTROL XL) 10 MG 24 hr tablet    Sig: 1 tab by mouth once daily    Dispense:  90 tablet    Refill:  3    ZERO refills remain on this prescription. Your patient is requesting advance approval of refills for this medication to PREVENT ANY MISSED DOSES  . empagliflozin (JARDIANCE) 25 MG TABS tablet    Sig: TAKE 1 TABLET BY MOUTH DAILY.    Dispense:  90 tablet    Refill:  3    ZERO refills remain on this prescription. Your patient is requesting advance approval of refills for this medication to PREVENT ANY MISSED DOSES  . losartan (COZAAR) 50 MG tablet    Sig: Take 1 tablet (50 mg total) by mouth daily. Needs appt for further refills    Dispense:  90 tablet    Refill:  3  . meclizine (ANTIVERT) 12.5 MG tablet    Sig: Take 1 tablet (12.5 mg total) by mouth 3 (three) times daily as needed for dizziness.    Dispense:  60 tablet    Refill:  0  . metFORMIN (GLUCOPHAGE) 1000 MG tablet    Sig: 1 tab by mouth twice daily    Dispense:  180 tablet    Refill:  3    ZERO refills remain on this prescription. Your patient is requesting advance approval of refills for this medication to PREVENT ANY MISSED DOSES  . metoprolol succinate (TOPROL-XL) 100 MG 24 hr tablet    Sig: Take 1 tablet (100 mg total) by mouth daily. Take with or immediately following a meal.    Dispense:  90 tablet    Refill:  3  . DISCONTD: pioglitazone (ACTOS) 15 MG tablet    Sig: TAKE 1 TABLET(15 MG) BY MOUTH DAILY    Dispense:  90 tablet    Refill:  3    ZERO refills remain on this prescription. Your patient is requesting advance approval of refills for this medication to PREVENT ANY MISSED DOSES  . potassium chloride SA  (KLOR-CON) 20 MEQ tablet    Sig: Take 1 tablet (20 mEq total) by mouth 2 (two) times daily.    Dispense:  180 tablet    Refill:  3    Please cancel all previous orders for current medication. Change in dosage or pill size.  . pregabalin (LYRICA) 50 MG capsule    Sig: TAKE 1 CAPSULE(50 MG) BY MOUTH THREE TIMES DAILY    Dispense:  270 capsule    Refill:  1  . rivaroxaban (XARELTO) 20 MG TABS tablet    Sig: Take 1 tablet (20 mg total) by mouth daily with supper.    Dispense:  90 tablet    Refill:  3  . DISCONTD: spironolactone (ALDACTONE) 25 MG  tablet    Sig: TAKE 1 TABLET(25 MG) BY MOUTH DAILY    Dispense:  90 tablet    Refill:  0    ZERO refills remain on this prescription. Your patient is requesting advance approval of refills --NO ADDITIONAL REFILL WILL BE ADDED UNTIL PATIENT RETURNS FOR OFFICE VISIT PLEASE HAVE PATIENT CALL [phone]  . spironolactone (ALDACTONE) 25 MG tablet    Sig: TAKE 1 TABLET(25 MG) BY MOUTH DAILY    Dispense:  90 tablet    Refill:  3    Follow-up: Return in about 6 months (around 12/19/2020).   Oliver Barre, MD 06/24/2020 12:38 PM [facility]

## 2020-06-21 NOTE — Assessment & Plan Note (Signed)
Chronic persistent, declines tx for now

## 2020-06-21 NOTE — Patient Instructions (Signed)
You are given the handicapped form signed today  Please take all new medication as prescribed - the meclizine for vertigo as needed  Please continue all other medications as before, and refills have been done if requested.  Please have the pharmacy call with any other refills you may need.  Please continue your efforts at being more active, low cholesterol diet, and weight control.  You are otherwise up to date with prevention measures today.  Please keep your appointments with your specialists as you may have planned - cardiology, and GYN  Please go to the LAB at the blood drawing area for the tests to be done  You will be contacted by phone if any changes need to be made immediately.  Otherwise, you will receive a letter about your results with an explanation, but please check with MyChart first.  Please remember to sign up for MyChart if you have not done so, as this will be important to you in the future with finding out test results, communicating by private email, and scheduling acute appointments online when needed.  Please make an Appointment to return in 6 months, or sooner if needed

## 2020-06-24 ENCOUNTER — Encounter: Payer: Self-pay | Admitting: Internal Medicine

## 2020-06-24 ENCOUNTER — Other Ambulatory Visit: Payer: Self-pay | Admitting: Internal Medicine

## 2020-06-24 DIAGNOSIS — I5032 Chronic diastolic (congestive) heart failure: Secondary | ICD-10-CM | POA: Insufficient documentation

## 2020-06-24 MED ORDER — THERA-D 2000 50 MCG (2000 UT) PO TABS: ORAL_TABLET | ORAL | 3 refills | Status: DC

## 2020-06-24 MED ORDER — PIOGLITAZONE HCL 30 MG PO TABS: 30.0000 mg | ORAL_TABLET | Freq: Every day | ORAL | 3 refills | Status: DC

## 2020-06-24 NOTE — Assessment & Plan Note (Signed)
Suspect worsening recent, declines med tx change for now, cont to follow, denies SI or HI or need for referral

## 2020-06-24 NOTE — Assessment & Plan Note (Signed)
Encouraged to consider cane use to avoid further falls, declines outpt PT referral, and handicapped parking application signed

## 2020-06-24 NOTE — Assessment & Plan Note (Signed)
Lab Results  Component Value Date   CREATININE 1.79 (H) 06/21/2020   Stable overall, cont to avoid nephrotoxins

## 2020-06-24 NOTE — Assessment & Plan Note (Addendum)
?   Control - for a1c   O/w Stable, pt to continue current medical treatment jardiacne, glucotrol, metformin, actos  Current Outpatient Medications (Endocrine & Metabolic):  .  empagliflozin (JARDIANCE) 25 MG TABS tablet, TAKE 1 TABLET BY MOUTH DAILY. Marland Kitchen  glipiZIDE (GLUCOTROL XL) 10 MG 24 hr tablet, 1 tab by mouth once daily .  metFORMIN (GLUCOPHAGE) 1000 MG tablet, 1 tab by mouth twice daily .  pioglitazone (ACTOS) 30 MG tablet, Take 1 tablet (30 mg total) by mouth daily.  Current Outpatient Medications (Cardiovascular):  .  atorvastatin (LIPITOR) 40 MG tablet, TAKE 1 TABLET(40 MG) BY MOUTH DAILY .  diltiazem (CARDIZEM CD) 360 MG 24 hr capsule, TAKE 1 CAPSULE(360 MG) BY MOUTH DAILY .  furosemide (LASIX) 40 MG tablet, Take 1 tablet (40 mg total) by mouth daily. Marland Kitchen  losartan (COZAAR) 50 MG tablet, Take 1 tablet (50 mg total) by mouth daily. Needs appt for further refills .  metoprolol succinate (TOPROL-XL) 100 MG 24 hr tablet, Take 1 tablet (100 mg total) by mouth daily. Take with or immediately following a meal. .  spironolactone (ALDACTONE) 25 MG tablet, TAKE 1 TABLET(25 MG) BY MOUTH DAILY  Current Outpatient Medications (Respiratory):  .  albuterol (VENTOLIN HFA) 108 (90 Base) MCG/ACT inhaler, Inhale 2 puffs into the lungs every 6 (six) hours as needed for wheezing or shortness of breath. .  cetirizine (ZYRTEC) 10 MG tablet, 1 tab by mouth once daily as needed  Current Outpatient Medications (Analgesics):  .  traMADol (ULTRAM) 50 MG tablet, Take 1 tablet (50 mg total) by mouth every 6 (six) hours as needed. (Patient not taking: Reported on 06/21/2020)  Current Outpatient Medications (Hematological):  .  rivaroxaban (XARELTO) 20 MG TABS tablet, Take 1 tablet (20 mg total) by mouth daily with supper.  Current Outpatient Medications (Other):  Marland Kitchen  Omega-3 Fatty Acids (FISH OIL) 1000 MG CAPS, Take 2 capsules (2,000 mg total) by mouth 2 (two) times daily. .  Cholecalciferol (THERA-D 2000) 50 MCG  (2000 UT) TABS, 1 tab by mouth once daily .  cyclobenzaprine (FLEXERIL) 5 MG tablet, Take 1 tablet (5 mg total) by mouth 3 (three) times daily as needed for muscle spasms. (Patient not taking: Reported on 06/21/2020) .  meclizine (ANTIVERT) 12.5 MG tablet, Take 1 tablet (12.5 mg total) by mouth 3 (three) times daily as needed for dizziness. .  potassium chloride SA (KLOR-CON) 20 MEQ tablet, Take 1 tablet (20 mEq total) by mouth 2 (two) times daily. .  pregabalin (LYRICA) 50 MG capsule, TAKE 1 CAPSULE(50 MG) BY MOUTH THREE TIMES DAILY

## 2020-06-24 NOTE — Assessment & Plan Note (Addendum)
Ok for [employer] refill, encourage to use prn, declines cns imaging or neuro referral

## 2020-06-24 NOTE — Assessment & Plan Note (Signed)
BP Readings from Last 3 Encounters:  06/21/20 120/78  03/20/20 (!) 148/82  03/09/20 (!) 154/83   Stable, pt to continue medical treatment  - losartan ,toprol, lasix, aldactone  Current Outpatient Medications (Endocrine & Metabolic):  .  empagliflozin (JARDIANCE) 25 MG TABS tablet, TAKE 1 TABLET BY MOUTH DAILY. Marland Kitchen  glipiZIDE (GLUCOTROL XL) 10 MG 24 hr tablet, 1 tab by mouth once daily .  metFORMIN (GLUCOPHAGE) 1000 MG tablet, 1 tab by mouth twice daily .  pioglitazone (ACTOS) 30 MG tablet, Take 1 tablet (30 mg total) by mouth daily.  Current Outpatient Medications (Cardiovascular):  .  atorvastatin (LIPITOR) 40 MG tablet, TAKE 1 TABLET(40 MG) BY MOUTH DAILY .  diltiazem (CARDIZEM CD) 360 MG 24 hr capsule, TAKE 1 CAPSULE(360 MG) BY MOUTH DAILY .  furosemide (LASIX) 40 MG tablet, Take 1 tablet (40 mg total) by mouth daily. Marland Kitchen  losartan (COZAAR) 50 MG tablet, Take 1 tablet (50 mg total) by mouth daily. Needs appt for further refills .  metoprolol succinate (TOPROL-XL) 100 MG 24 hr tablet, Take 1 tablet (100 mg total) by mouth daily. Take with or immediately following a meal. .  spironolactone (ALDACTONE) 25 MG tablet, TAKE 1 TABLET(25 MG) BY MOUTH DAILY  Current Outpatient Medications (Respiratory):  .  albuterol (VENTOLIN HFA) 108 (90 Base) MCG/ACT inhaler, Inhale 2 puffs into the lungs every 6 (six) hours as needed for wheezing or shortness of breath. .  cetirizine (ZYRTEC) 10 MG tablet, 1 tab by mouth once daily as needed  Current Outpatient Medications (Analgesics):  .  traMADol (ULTRAM) 50 MG tablet, Take 1 tablet (50 mg total) by mouth every 6 (six) hours as needed. (Patient not taking: Reported on 06/21/2020)  Current Outpatient Medications (Hematological):  .  rivaroxaban (XARELTO) 20 MG TABS tablet, Take 1 tablet (20 mg total) by mouth daily with supper.  Current Outpatient Medications (Other):  Marland Kitchen  Omega-3 Fatty Acids (FISH OIL) 1000 MG CAPS, Take 2 capsules (2,000 mg total) by  mouth 2 (two) times daily. .  Cholecalciferol (THERA-D 2000) 50 MCG (2000 UT) TABS, 1 tab by mouth once daily .  cyclobenzaprine (FLEXERIL) 5 MG tablet, Take 1 tablet (5 mg total) by mouth 3 (three) times daily as needed for muscle spasms. (Patient not taking: Reported on 06/21/2020) .  meclizine (ANTIVERT) 12.5 MG tablet, Take 1 tablet (12.5 mg total) by mouth 3 (three) times daily as needed for dizziness. .  potassium chloride SA (KLOR-CON) 20 MEQ tablet, Take 1 tablet (20 mEq total) by mouth 2 (two) times daily. .  pregabalin (LYRICA) 50 MG capsule, TAKE 1 CAPSULE(50 MG) BY MOUTH THREE TIMES DAILY

## 2020-06-24 NOTE — Assessment & Plan Note (Signed)
Lab Results  Component Value Date   LDLCALC 105 (H) 06/21/2020   Stable, pt to continue current statin lipitor 40 - declines med change

## 2020-06-24 NOTE — Assessment & Plan Note (Signed)
Overall stable, for BNP with labs

## 2020-06-24 NOTE — Assessment & Plan Note (Signed)

## 2020-06-25 LAB — PTH, INTACT AND CALCIUM
Calcium: 10.1 mg/dL (ref 8.6–10.4)
PTH: 65 pg/mL — ABNORMAL HIGH (ref 14–64)

## 2020-06-25 LAB — HIV ANTIBODY (ROUTINE TESTING W REFLEX): HIV 1&2 Ab, 4th Generation: NONREACTIVE

## 2020-08-17 ENCOUNTER — Other Ambulatory Visit (HOSPITAL_COMMUNITY): Payer: Self-pay | Admitting: Cardiology

## 2020-09-04 ENCOUNTER — Telehealth (HOSPITAL_COMMUNITY): Payer: Self-pay | Admitting: Cardiology

## 2020-09-06 ENCOUNTER — Other Ambulatory Visit (HOSPITAL_COMMUNITY): Payer: Self-pay

## 2020-09-06 MED ORDER — FUROSEMIDE 40 MG PO TABS: 40.0000 mg | ORAL_TABLET | Freq: Every day | ORAL | 0 refills | Status: DC

## 2020-09-15 ENCOUNTER — Other Ambulatory Visit: Payer: Self-pay | Admitting: Internal Medicine

## 2020-09-19 ENCOUNTER — Other Ambulatory Visit (HOSPITAL_COMMUNITY): Payer: Self-pay | Admitting: Cardiology

## 2020-11-17 ENCOUNTER — Encounter (HOSPITAL_COMMUNITY): Payer: Self-pay | Admitting: Emergency Medicine

## 2020-11-17 ENCOUNTER — Emergency Department (HOSPITAL_COMMUNITY): Payer: Managed Care, Other (non HMO)

## 2020-11-17 ENCOUNTER — Inpatient Hospital Stay (HOSPITAL_COMMUNITY)
Admission: EM | Admit: 2020-11-17 | Discharge: 2020-11-24 | DRG: 683 | Disposition: A | Discharge status: Skilled Nursing Facility | Payer: Managed Care, Other (non HMO) | Attending: Internal Medicine | Admitting: Internal Medicine

## 2020-11-17 DIAGNOSIS — E66813 Obesity, class 3: Secondary | ICD-10-CM | POA: Diagnosis present

## 2020-11-17 DIAGNOSIS — Z91138 Patient's unintentional underdosing of medication regimen for other reason: Secondary | ICD-10-CM

## 2020-11-17 DIAGNOSIS — R748 Abnormal levels of other serum enzymes: Secondary | ICD-10-CM

## 2020-11-17 DIAGNOSIS — M25562 Pain in left knee: Secondary | ICD-10-CM

## 2020-11-17 DIAGNOSIS — R262 Difficulty in walking, not elsewhere classified: Secondary | ICD-10-CM | POA: Diagnosis present

## 2020-11-17 DIAGNOSIS — M6282 Rhabdomyolysis: Secondary | ICD-10-CM | POA: Diagnosis present

## 2020-11-17 DIAGNOSIS — E1159 Type 2 diabetes mellitus with other circulatory complications: Secondary | ICD-10-CM | POA: Diagnosis present

## 2020-11-17 DIAGNOSIS — E1122 Type 2 diabetes mellitus with diabetic chronic kidney disease: Secondary | ICD-10-CM | POA: Diagnosis present

## 2020-11-17 DIAGNOSIS — T447X6A Underdosing of beta-adrenoreceptor antagonists, initial encounter: Secondary | ICD-10-CM | POA: Diagnosis present

## 2020-11-17 DIAGNOSIS — E119 Type 2 diabetes mellitus without complications: Secondary | ICD-10-CM

## 2020-11-17 DIAGNOSIS — T500X5A Adverse effect of mineralocorticoids and their antagonists, initial encounter: Secondary | ICD-10-CM | POA: Diagnosis present

## 2020-11-17 DIAGNOSIS — T465X5A Adverse effect of other antihypertensive drugs, initial encounter: Secondary | ICD-10-CM | POA: Diagnosis present

## 2020-11-17 DIAGNOSIS — T500X6A Underdosing of mineralocorticoids and their antagonists, initial encounter: Secondary | ICD-10-CM | POA: Diagnosis present

## 2020-11-17 DIAGNOSIS — Z7984 Long term (current) use of oral hypoglycemic drugs: Secondary | ICD-10-CM

## 2020-11-17 DIAGNOSIS — T466X6A Underdosing of antihyperlipidemic and antiarteriosclerotic drugs, initial encounter: Secondary | ICD-10-CM | POA: Diagnosis present

## 2020-11-17 DIAGNOSIS — T45516A Underdosing of anticoagulants, initial encounter: Secondary | ICD-10-CM | POA: Diagnosis present

## 2020-11-17 DIAGNOSIS — M1612 Unilateral primary osteoarthritis, left hip: Secondary | ICD-10-CM | POA: Diagnosis present

## 2020-11-17 DIAGNOSIS — M170 Bilateral primary osteoarthritis of knee: Secondary | ICD-10-CM | POA: Diagnosis present

## 2020-11-17 DIAGNOSIS — E860 Dehydration: Secondary | ICD-10-CM | POA: Diagnosis present

## 2020-11-17 DIAGNOSIS — N1832 Chronic kidney disease, stage 3b: Secondary | ICD-10-CM | POA: Diagnosis present

## 2020-11-17 DIAGNOSIS — Z6841 Body Mass Index (BMI) 40.0 and over, adult: Secondary | ICD-10-CM

## 2020-11-17 DIAGNOSIS — I4821 Permanent atrial fibrillation: Secondary | ICD-10-CM | POA: Diagnosis present

## 2020-11-17 DIAGNOSIS — T501X6A Underdosing of loop [high-ceiling] diuretics, initial encounter: Secondary | ICD-10-CM | POA: Diagnosis present

## 2020-11-17 DIAGNOSIS — Z7901 Long term (current) use of anticoagulants: Secondary | ICD-10-CM

## 2020-11-17 DIAGNOSIS — Z833 Family history of diabetes mellitus: Secondary | ICD-10-CM

## 2020-11-17 DIAGNOSIS — T426X6A Underdosing of other antiepileptic and sedative-hypnotic drugs, initial encounter: Secondary | ICD-10-CM | POA: Diagnosis present

## 2020-11-17 DIAGNOSIS — I482 Chronic atrial fibrillation, unspecified: Secondary | ICD-10-CM | POA: Diagnosis present

## 2020-11-17 DIAGNOSIS — I152 Hypertension secondary to endocrine disorders: Secondary | ICD-10-CM | POA: Diagnosis present

## 2020-11-17 DIAGNOSIS — E1165 Type 2 diabetes mellitus with hyperglycemia: Secondary | ICD-10-CM | POA: Diagnosis present

## 2020-11-17 DIAGNOSIS — T461X6A Underdosing of calcium-channel blockers, initial encounter: Secondary | ICD-10-CM | POA: Diagnosis present

## 2020-11-17 DIAGNOSIS — Z79899 Other long term (current) drug therapy: Secondary | ICD-10-CM

## 2020-11-17 DIAGNOSIS — I5032 Chronic diastolic (congestive) heart failure: Secondary | ICD-10-CM | POA: Diagnosis present

## 2020-11-17 DIAGNOSIS — L89326 Pressure-induced deep tissue damage of left buttock: Secondary | ICD-10-CM | POA: Diagnosis present

## 2020-11-17 DIAGNOSIS — T501X5A Adverse effect of loop [high-ceiling] diuretics, initial encounter: Secondary | ICD-10-CM | POA: Diagnosis present

## 2020-11-17 DIAGNOSIS — N179 Acute kidney failure, unspecified: Principal | ICD-10-CM | POA: Diagnosis present

## 2020-11-17 DIAGNOSIS — Z20822 Contact with and (suspected) exposure to covid-19: Secondary | ICD-10-CM | POA: Diagnosis present

## 2020-11-17 DIAGNOSIS — E785 Hyperlipidemia, unspecified: Secondary | ICD-10-CM | POA: Diagnosis present

## 2020-11-17 DIAGNOSIS — G629 Polyneuropathy, unspecified: Secondary | ICD-10-CM | POA: Diagnosis present

## 2020-11-17 DIAGNOSIS — Z823 Family history of stroke: Secondary | ICD-10-CM

## 2020-11-17 DIAGNOSIS — T465X6A Underdosing of other antihypertensive drugs, initial encounter: Secondary | ICD-10-CM | POA: Diagnosis present

## 2020-11-17 DIAGNOSIS — I130 Hypertensive heart and chronic kidney disease with heart failure and stage 1 through stage 4 chronic kidney disease, or unspecified chronic kidney disease: Secondary | ICD-10-CM | POA: Diagnosis present

## 2020-11-17 LAB — CBC WITH DIFFERENTIAL/PLATELET
Abs Immature Granulocytes: 0.08 10*3/uL — ABNORMAL HIGH (ref 0.00–0.07)
Basophils Absolute: 0.1 10*3/uL (ref 0.0–0.1)
Basophils Relative: 0 %
Eosinophils Absolute: 0.1 10*3/uL (ref 0.0–0.5)
Eosinophils Relative: 1 %
HCT: 38.9 % (ref 36.0–46.0)
Hemoglobin: 11.9 g/dL — ABNORMAL LOW (ref 12.0–15.0)
Immature Granulocytes: 1 %
Lymphocytes Relative: 8 %
Lymphs Abs: 1 10*3/uL (ref 0.7–4.0)
MCH: 25.9 pg — ABNORMAL LOW (ref 26.0–34.0)
MCHC: 30.6 g/dL (ref 30.0–36.0)
MCV: 84.6 fL (ref 80.0–100.0)
Monocytes Absolute: 0.9 10*3/uL (ref 0.1–1.0)
Monocytes Relative: 7 %
Neutro Abs: 10.9 10*3/uL — ABNORMAL HIGH (ref 1.7–7.7)
Neutrophils Relative %: 83 %
Platelets: 299 10*3/uL (ref 150–400)
RBC: 4.6 MIL/uL (ref 3.87–5.11)
RDW: 16 % — ABNORMAL HIGH (ref 11.5–15.5)
WBC: 13.1 10*3/uL — ABNORMAL HIGH (ref 4.0–10.5)
nRBC: 0 % (ref 0.0–0.2)

## 2020-11-17 LAB — BASIC METABOLIC PANEL
Anion gap: 10 (ref 5–15)
BUN: 39 mg/dL — ABNORMAL HIGH (ref 8–23)
CO2: 24 mmol/L (ref 22–32)
Calcium: 9.7 mg/dL (ref 8.9–10.3)
Chloride: 104 mmol/L (ref 98–111)
Creatinine, Ser: 2.15 mg/dL — ABNORMAL HIGH (ref 0.44–1.00)
GFR, Estimated: 25 mL/min — ABNORMAL LOW (ref >60)
Glucose, Bld: 159 mg/dL — ABNORMAL HIGH (ref 70–99)
Potassium: 3.8 mmol/L (ref 3.5–5.1)
Sodium: 138 mmol/L (ref 135–145)

## 2020-11-17 LAB — CBG MONITORING, ED: Glucose-Capillary: 144 mg/dL — ABNORMAL HIGH (ref 70–99)

## 2020-11-17 MED ORDER — SODIUM CHLORIDE 0.9 % IV BOLUS
500.0000 mL | Freq: Once | INTRAVENOUS | Status: AC
  Administered 2020-11-18: 500 mL via INTRAVENOUS

## 2020-11-17 NOTE — ED Triage Notes (Signed)
Pt arrives via EMS from home with reports of bilateral knee and leg pain since Thursday. Pt has osteoarthritis and limited mobility.

## 2020-11-17 NOTE — ED Notes (Signed)
Pt transferred from recliner to stretcher

## 2020-11-17 NOTE — ED Notes (Signed)
Patient transported to X-ray 

## 2020-11-17 NOTE — ED Provider Notes (Signed)
Emergency Medicine Provider Triage Evaluation Note  Pamela [name] , a 65 y.o. female  was evaluated in triage.  Pt complains of pain in both legs, worse in left hip/buttock radiating to left knee. Severe. History of same but much worse on Thursday. Spent all night on the floor because she couldn't stand up due to pain, crawled to her phone. H/o neuropathy. No falls.   Review of Systems  Positive: Left hip and back pain  Negative: Fever swelling   Physical Exam  BP (!) 143/79 (BP Location: Left Arm)   Pulse 70   Temp 98.4 F (36.9 C) (Oral)   Resp 14   Ht 6' (1.829 m)   Wt (!) 158.8 kg   LMP 09/08/2010 (Exact Date)   SpO2 90%   BMI 47.47 kg/m  Gen:   Awake, no distress   Resp:  Normal effort  MSK:   Moves extremities without difficulty  Other:  1+ DP pulse bilateral LLE Neuro:  Can wiggle toes and dorsiflex/plantar flex equal strength   Medical Decision Making  Medically screening exam initiated at 3:25 PM.  Appropriate orders placed.     Patient made aware this encounter is a triage and screening encounter and no beds are immediately available at this time in the ER.  Patient was informed that the remainder of the evaluation will be completed by another provider.  Patient made aware triage orders have been placed and patient will be placed in the waiting room while work up is initiated and until a room becomes available. Patient encouraged to await a formal ER encounter with a clinician.  Patient made aware that exiting the department prior to formal encounter with an ER clinician and completion of the work-up is considered leaving against medical advice.  At that time there is no guarantee that there are no emergency medical conditions present and patient assumes risks of leaving including worsening condition, permanent disability and death. Patient verbalizes understanding.     Liberty Handy, PA-C 11/17/20 1527    Benjiman Core, MD 11/17/20 [phone]

## 2020-11-17 NOTE — ED Provider Notes (Signed)
Emergency Department Provider Note   I have reviewed the triage vital signs and the nursing notes.   HISTORY  Chief Complaint Knee Pain   HPI Pamela [name] is a 65 y.o. female with PMH of A. fib, diabetes, hypertension, elevated BMI presents to the ED with left leg pain and inability to walk.  Patient notes a Pamela [name] history of osteoarthritis but is limited in terms of her arthritis medication options due to her A. fib and being anticoagulated.  She has spoken with her PCP regarding this in the past but has never followed regularly with an orthopedist.  She began having severe leg pain in the left knee and hip several days ago.  Yesterday, she sat on the toilet but could not get up due to pain.  She denies weakness or numbness in the leg but notes that she does have some neuropathy in her feet which is baseline.  No arm or face symptoms.  No severe headaches.  After sitting on the toilet for approximately 11 hours she decided to try and crawl into the room to get her phone.  She was then unable to get off of the floor for another 8 to 9 hours.  At some point she was able to call her family who were unable to get her up and ultimately called EMS. Denies fever or chills. She has not taken any of her medications.   Past Medical History:  Diagnosis Date   ALLERGIC RHINITIS 12/31/2007   ANXIETY 12/31/2007   Atrial fibrillation (HCC)    DEPRESSION 12/31/2007   DIABETES MELLITUS, TYPE II 12/31/2007   Diastolic dysfunction 10/11/2010   Hyperlipidemia 04/23/2012   HYPERTENSION 12/31/2007   Migraine    PNA (pneumonia)    in her 20s   Right knee DJD    Rosacea 10/11/2010    Patient Active Problem List   Diagnosis Date Noted   ARF (acute renal failure) (HCC) 11/18/2020   Chronic diastolic heart failure (HCC) 06/24/2020   Debility 06/21/2020   Vertigo 03/20/2020   SOB (shortness of breath) 03/20/2020   Left shoulder pain 02/27/2019   Acute upper respiratory infection 09/07/2018   Low back pain  12/01/2017   Diabetic foot ulcer (HCC) 03/03/2017   Charcot foot due to diabetes mellitus (HCC) 02/28/2017   Diabetic ulcer of toe of left foot associated with type 2 diabetes mellitus, limited to breakdown of skin (HCC) 02/28/2017   Obesity, Class III, BMI 40-49.9 (morbid obesity) (HCC) 02/28/2017   Chronic atrial fibrillation (HCC) 12/08/2015   Navicular fracture of ankle with malunion 05/30/2015   Neuropathic pain of foot 05/30/2015   Navicular fracture, foot 05/02/2015   Right ankle pain 04/19/2015   Cough 11/17/2014   Fatigue 09/25/2014   Ganglion cyst 02/06/2014   Bilateral hand pain 11/29/2012   Anemia, unspecified 04/23/2012   CKD (chronic kidney disease) stage 3, GFR 30-59 ml/min (HCC) 04/23/2012   Hyperlipidemia 04/23/2012   Peripheral neuropathy 01/16/2012   DJD (degenerative joint disease) of knee 04/15/2011   Migraine 04/15/2011   Acute on chronic diastolic heart failure (HCC) 10/28/2010   Rosacea 10/11/2010   Anticoagulated 10/11/2010   Encounter for preventative adult health care exam with abnormal findings 10/06/2010   Diabetes (HCC) 12/31/2007   Anxiety state 12/31/2007   Depression 12/31/2007   Essential hypertension 12/31/2007   ALLERGIC RHINITIS 12/31/2007    Past Surgical History:  Procedure Laterality Date   2 D&C     1980s    Allergies Cymbalta [duloxetine  hcl] and Gabapentin  Family History  Problem Relation Age of Onset   Arthritis Other    Cancer Other        breast   Heart disease Other    Stroke Other    Mental illness Other        emotional   Diabetes Other     Social History Social History   Tobacco Use   Smoking status: Never   Smokeless tobacco: Never  Substance Use Topics   Alcohol use: Yes    Comment: social   Drug use: No    Review of Systems  Constitutional: No fever/chills Eyes: No visual changes. ENT: No sore throat. Cardiovascular: Denies chest pain. Respiratory: Denies shortness of breath. Gastrointestinal:  No abdominal pain. No nausea, no vomiting.  No diarrhea.  No constipation. Genitourinary: Negative for dysuria. Musculoskeletal: Negative for back pain. Positive left hip and knee pain.  Skin: Negative for rash. Neurological: Negative for headaches, focal weakness or numbness.  10-point ROS otherwise negative.  ____________________________________________   PHYSICAL EXAM:  VITAL SIGNS: ED Triage Vitals  Enc Vitals Group     BP 11/17/20 1521 (!) 143/79     Pulse Rate 11/17/20 1521 70     Resp 11/17/20 1521 14     Temp 11/17/20 1521 98.4 F (36.9 C)     Temp Source 11/17/20 1521 Oral     SpO2 11/17/20 1521 90 %     Weight 11/17/20 1524 (!) 350 lb (158.8 kg)     Height 11/17/20 1524 6' (1.829 m)   Constitutional: Alert and oriented. Well appearing and in no acute distress. Eyes: Conjunctivae are normal.  Head: Atraumatic. Nose: No congestion/rhinnorhea. Mouth/Throat: Mucous membranes are moist.  Neck: No stridor.   Cardiovascular: Normal rate, regular rhythm. Good peripheral circulation. Grossly normal heart sounds.   Respiratory: Normal respiratory effort.  No retractions. Lungs CTAB. Gastrointestinal: Soft with mild tenderness in the RLQ No rebound or guarding. No distention.  Musculoskeletal: Patient with 1+ pitting edema in the bilateral lower extremities.  No leg cellulitis, ulcerations, joint swelling/erythema/warmth.  Limited range of motion at the left hip and knee secondary to pain.  Neurologic:  Normal speech and language. No gross focal neurologic deficits are appreciated.  Skin:  Skin is warm, dry and intact. No rash noted.   ____________________________________________   LABS (all labs ordered are listed, but only abnormal results are displayed)  Labs Reviewed  CBC WITH DIFFERENTIAL/PLATELET - Abnormal; Notable for the following components:      Result Value   WBC 13.1 (*)    Hemoglobin 11.9 (*)    MCH 25.9 (*)    RDW 16.0 (*)    Neutro Abs 10.9 (*)     Abs Immature Granulocytes 0.08 (*)    All other components within normal limits  BASIC METABOLIC PANEL - Abnormal; Notable for the following components:   Glucose, Bld 159 (*)    BUN 39 (*)    Creatinine, Ser 2.15 (*)    GFR, Estimated 25 (*)    All other components within normal limits  CK - Abnormal; Notable for the following components:   Total CK 787 (*)    All other components within normal limits  CBG MONITORING, ED - Abnormal; Notable for the following components:   Glucose-Capillary 144 (*)    All other components within normal limits  RESP PANEL BY RT-PCR (FLU A&B, COVID) [id number]   ____________________________________________  EKG   EKG Interpretation  Date/Time:  Sunday November 18 2020 02:10:07 EDT Ventricular Rate:  116 PR Interval:    QRS Duration: 76 QT Interval:  300 QTC Calculation: 417 R Axis:   137 Text Interpretation: Atrial fibrillation with rapid ventricular response Low voltage QRS Left posterior fascicular block Inferior infarct , age undetermined Cannot rule out Anterior infarct , age undetermined Abnormal ECG Confirmed by Alona Bene [phone]) on 11/18/2020 2:22:27 AM         ____________________________________________  RADIOLOGY  CT ABDOMEN PELVIS WO CONTRAST  Result Date: 11/18/2020 CLINICAL DATA:  Right lower quadrant abdominal pain EXAM: CT ABDOMEN AND PELVIS WITHOUT CONTRAST TECHNIQUE: Multidetector CT imaging of the abdomen and pelvis was performed following the standard protocol without IV contrast. COMPARISON:  None. FINDINGS: Lower chest: Cardiomegaly.  No acute abnormality. Hepatobiliary: No focal hepatic abnormality. Gallbladder unremarkable. Pancreas: No focal abnormality or ductal dilatation. Spleen: No focal abnormality.  Normal size. Adrenals/Urinary Tract: Prominent soft tissue area in the midpole of the right kidney measures 2.5 cm and cannot be characterized on this noncontrast study. No hydronephrosis. No renal or ureteral stones.  Adrenal glands and urinary bladder unremarkable. Stomach/Bowel: Diffuse colonic diverticulosis. No active diverticulitis. Stomach and small bowel decompressed, unremarkable. Vascular/Lymphatic: Aortic atherosclerosis. No evidence of aneurysm or adenopathy. Reproductive: Uterus and adnexa unremarkable.  No mass. Other: No free fluid or free air. Musculoskeletal: No acute bony abnormality. IMPRESSION: Colonic diverticulosis.  No active diverticulitis. Cardiomegaly. Prominent soft tissue area in the parapelvic midpole of the right kidney, difficult to characterize on this unenhanced study. This could be further evaluated with contrast-enhanced CT or ultrasound. No acute findings in the abdomen or pelvis. Electronically Signed   By: Charlett Nose M.D.   On: 11/18/2020 01:10   DG Lumbar Spine Complete  Result Date: 11/18/2020 CLINICAL DATA:  Left hip pain radiating to the left knee EXAM: LUMBAR SPINE - COMPLETE 4+ VIEW COMPARISON:  None. FINDINGS: Normal alignment. Diffuse degenerative disc and facet disease. No fracture. SI joints symmetric and unremarkable. IMPRESSION: Diffuse degenerative disc and facet disease. Electronically Signed   By: Charlett Nose M.D.   On: 11/18/2020 00:17   DG Knee Complete 4 Views Left  Result Date: 11/18/2020 CLINICAL DATA:  Pain EXAM: LEFT KNEE - COMPLETE 4+ VIEW COMPARISON:  None. FINDINGS: Advanced tricompartment degenerative changes most pronounced in the medial and patellofemoral compartments. No joint effusion. No acute bony abnormality. Specifically, no fracture, subluxation, or dislocation. IMPRESSION: Advanced tricompartment degenerative changes. No acute bony abnormality. Electronically Signed   By: Charlett Nose M.D.   On: 11/18/2020 00:16   DG Hip Unilat W or Wo Pelvis 2-3 Views Left  Result Date: 11/18/2020 CLINICAL DATA:  Left hip pain radiating to left knee. EXAM: DG HIP (WITH OR WITHOUT PELVIS) 2-3V LEFT COMPARISON:  None. FINDINGS: Degenerative changes in the hips  bilaterally. No acute bony abnormality. Specifically, no fracture, subluxation, or dislocation. IMPRESSION: No acute bony abnormality. Electronically Signed   By: Charlett Nose M.D.   On: 11/18/2020 00:16    ____________________________________________   PROCEDURES  Procedure(s) performed:   Procedures  None  ____________________________________________   INITIAL IMPRESSION / ASSESSMENT AND PLAN / ED COURSE  Pertinent labs & imaging results that were available during my care of the patient were reviewed by me and considered in my medical decision making (see chart for details).   Patient presents emergency department left leg pain and inability to walk at home.  The patient's knees do not appear particularly swollen, red, warm to raise suspicion for septic joint.  Patient has pain with range of motion of the left hip.  She is neurovascularly intact in the lower extremities.  No fall to start symptoms.  Imaging is pending.  Labs from [facility] show mild leukocytosis.  Patient does have some chronic kidney disease with creatinine of 2.15, higher than baseline.  Plan for small IV fluid bolus but will need to be cautious as patient does have congestive heart failure.  On abdominal exam the patient does seem to have some tenderness in the right lower quadrant.  She is not having pain there without palpation.  Plan for CT abdomen pelvis for better characterization of this.  We will also get some CT visualization of the left hip as well.  CK mildly elevated.  Creatinine slightly higher than baseline.  Plan for IV fluids and admit.  Suspect OA as the cause of patient's inability to walk without severe pain.  May require PT/OT.  EKG shows A. fib which the patient has a history of.  Mild RVR but does not take her home medications.  Plan to start p.o. meds.  No hypotension or other hemodynamic instability.   Discussed patient's case with [facility] to request admission. Patient and family (if present) updated with  plan. Care transferred to [facility] service.  I reviewed all nursing notes, vitals, pertinent old records, EKGs, labs, imaging (as available).  ____________________________________________  FINAL CLINICAL IMPRESSION(S) / ED DIAGNOSES  Final diagnoses:  Dehydration  Acute pain of left knee  Elevated CK    MEDICATIONS GIVEN DURING THIS VISIT:  Medications  sodium chloride 0.9 % bolus 500 mL (500 mLs Intravenous New Bag/Given 11/18/20 0028)    Note:  This document was prepared using Dragon voice recognition software and may include unintentional dictation errors.  Alona Bene, MD, [facility] Emergency Medicine    Syris Brookens, Arlyss Repress, MD 11/18/20 [phone]

## 2020-11-18 ENCOUNTER — Encounter (HOSPITAL_COMMUNITY): Payer: Self-pay | Admitting: Internal Medicine

## 2020-11-18 ENCOUNTER — Emergency Department (HOSPITAL_COMMUNITY): Payer: Managed Care, Other (non HMO)

## 2020-11-18 DIAGNOSIS — E1165 Type 2 diabetes mellitus with hyperglycemia: Secondary | ICD-10-CM | POA: Diagnosis present

## 2020-11-18 DIAGNOSIS — G629 Polyneuropathy, unspecified: Secondary | ICD-10-CM | POA: Diagnosis present

## 2020-11-18 DIAGNOSIS — R262 Difficulty in walking, not elsewhere classified: Secondary | ICD-10-CM | POA: Diagnosis present

## 2020-11-18 DIAGNOSIS — M25562 Pain in left knee: Secondary | ICD-10-CM

## 2020-11-18 DIAGNOSIS — E1122 Type 2 diabetes mellitus with diabetic chronic kidney disease: Secondary | ICD-10-CM | POA: Diagnosis present

## 2020-11-18 DIAGNOSIS — T466X6A Underdosing of antihyperlipidemic and antiarteriosclerotic drugs, initial encounter: Secondary | ICD-10-CM | POA: Diagnosis present

## 2020-11-18 DIAGNOSIS — M6282 Rhabdomyolysis: Secondary | ICD-10-CM | POA: Diagnosis present

## 2020-11-18 DIAGNOSIS — Z79899 Other long term (current) drug therapy: Secondary | ICD-10-CM | POA: Diagnosis not present

## 2020-11-18 DIAGNOSIS — Z7984 Long term (current) use of oral hypoglycemic drugs: Secondary | ICD-10-CM | POA: Diagnosis not present

## 2020-11-18 DIAGNOSIS — Z833 Family history of diabetes mellitus: Secondary | ICD-10-CM | POA: Diagnosis not present

## 2020-11-18 DIAGNOSIS — R5381 Other malaise: Secondary | ICD-10-CM | POA: Diagnosis not present

## 2020-11-18 DIAGNOSIS — I130 Hypertensive heart and chronic kidney disease with heart failure and stage 1 through stage 4 chronic kidney disease, or unspecified chronic kidney disease: Secondary | ICD-10-CM | POA: Diagnosis present

## 2020-11-18 DIAGNOSIS — N179 Acute kidney failure, unspecified: Secondary | ICD-10-CM | POA: Diagnosis present

## 2020-11-18 DIAGNOSIS — N1832 Chronic kidney disease, stage 3b: Secondary | ICD-10-CM | POA: Diagnosis present

## 2020-11-18 DIAGNOSIS — T461X6A Underdosing of calcium-channel blockers, initial encounter: Secondary | ICD-10-CM | POA: Diagnosis present

## 2020-11-18 DIAGNOSIS — E860 Dehydration: Secondary | ICD-10-CM | POA: Diagnosis present

## 2020-11-18 DIAGNOSIS — Z823 Family history of stroke: Secondary | ICD-10-CM | POA: Diagnosis not present

## 2020-11-18 DIAGNOSIS — M170 Bilateral primary osteoarthritis of knee: Secondary | ICD-10-CM | POA: Diagnosis present

## 2020-11-18 DIAGNOSIS — L89326 Pressure-induced deep tissue damage of left buttock: Secondary | ICD-10-CM | POA: Diagnosis present

## 2020-11-18 DIAGNOSIS — Z6841 Body Mass Index (BMI) 40.0 and over, adult: Secondary | ICD-10-CM | POA: Diagnosis not present

## 2020-11-18 DIAGNOSIS — Z7901 Long term (current) use of anticoagulants: Secondary | ICD-10-CM | POA: Diagnosis not present

## 2020-11-18 DIAGNOSIS — I5032 Chronic diastolic (congestive) heart failure: Secondary | ICD-10-CM | POA: Diagnosis present

## 2020-11-18 DIAGNOSIS — Z20822 Contact with and (suspected) exposure to covid-19: Secondary | ICD-10-CM | POA: Diagnosis present

## 2020-11-18 DIAGNOSIS — E785 Hyperlipidemia, unspecified: Secondary | ICD-10-CM | POA: Diagnosis present

## 2020-11-18 DIAGNOSIS — I4821 Permanent atrial fibrillation: Secondary | ICD-10-CM | POA: Diagnosis present

## 2020-11-18 DIAGNOSIS — R748 Abnormal levels of other serum enzymes: Secondary | ICD-10-CM

## 2020-11-18 LAB — COMPREHENSIVE METABOLIC PANEL
ALT: 21 U/L (ref 0–44)
AST: 27 U/L (ref 15–41)
Albumin: 3.2 g/dL — ABNORMAL LOW (ref 3.5–5.0)
Alkaline Phosphatase: 53 U/L (ref 38–126)
Anion gap: 12 (ref 5–15)
BUN: 36 mg/dL — ABNORMAL HIGH (ref 8–23)
CO2: 23 mmol/L (ref 22–32)
Calcium: 9.2 mg/dL (ref 8.9–10.3)
Chloride: 105 mmol/L (ref 98–111)
Creatinine, Ser: 1.96 mg/dL — ABNORMAL HIGH (ref 0.44–1.00)
GFR, Estimated: 28 mL/min — ABNORMAL LOW (ref >60)
Glucose, Bld: 163 mg/dL — ABNORMAL HIGH (ref 70–99)
Potassium: 3.7 mmol/L (ref 3.5–5.1)
Sodium: 140 mmol/L (ref 135–145)
Total Bilirubin: 1.2 mg/dL (ref 0.3–1.2)
Total Protein: 6.5 g/dL (ref 6.5–8.1)

## 2020-11-18 LAB — CBC WITH DIFFERENTIAL/PLATELET
Abs Immature Granulocytes: 0.07 10*3/uL (ref 0.00–0.07)
Basophils Absolute: 0.1 10*3/uL (ref 0.0–0.1)
Basophils Relative: 1 %
Eosinophils Absolute: 0.2 10*3/uL (ref 0.0–0.5)
Eosinophils Relative: 2 %
HCT: 36.5 % (ref 36.0–46.0)
Hemoglobin: 11.1 g/dL — ABNORMAL LOW (ref 12.0–15.0)
Immature Granulocytes: 1 %
Lymphocytes Relative: 10 %
Lymphs Abs: 1.1 10*3/uL (ref 0.7–4.0)
MCH: 25.8 pg — ABNORMAL LOW (ref 26.0–34.0)
MCHC: 30.4 g/dL (ref 30.0–36.0)
MCV: 84.7 fL (ref 80.0–100.0)
Monocytes Absolute: 0.9 10*3/uL (ref 0.1–1.0)
Monocytes Relative: 8 %
Neutro Abs: 8.8 10*3/uL — ABNORMAL HIGH (ref 1.7–7.7)
Neutrophils Relative %: 78 %
Platelets: 264 10*3/uL (ref 150–400)
RBC: 4.31 MIL/uL (ref 3.87–5.11)
RDW: 15.9 % — ABNORMAL HIGH (ref 11.5–15.5)
WBC: 11.1 10*3/uL — ABNORMAL HIGH (ref 4.0–10.5)
nRBC: 0 % (ref 0.0–0.2)

## 2020-11-18 LAB — RESP PANEL BY RT-PCR (FLU A&B, COVID) ARPGX2
Influenza A by PCR: NEGATIVE
Influenza B by PCR: NEGATIVE
SARS Coronavirus 2 by RT PCR: NEGATIVE

## 2020-11-18 LAB — GLUCOSE, CAPILLARY
Glucose-Capillary: 111 mg/dL — ABNORMAL HIGH (ref 70–99)
Glucose-Capillary: 172 mg/dL — ABNORMAL HIGH (ref 70–99)
Glucose-Capillary: 176 mg/dL — ABNORMAL HIGH (ref 70–99)
Glucose-Capillary: 150 mg/dL — ABNORMAL HIGH (ref 70–99)
Glucose-Capillary: 185 mg/dL — ABNORMAL HIGH (ref 70–99)

## 2020-11-18 LAB — CK
Total CK: 787 U/L — ABNORMAL HIGH (ref 38–234)
Total CK: 668 U/L — ABNORMAL HIGH (ref 38–234)

## 2020-11-18 LAB — URIC ACID: Uric Acid, Serum: 10 mg/dL — ABNORMAL HIGH (ref 2.5–7.1)

## 2020-11-18 MED ORDER — ACETAMINOPHEN 650 MG RE SUPP: 650.0000 mg | Freq: Four times a day (QID) | RECTAL | Status: DC | PRN

## 2020-11-18 MED ORDER — OMEGA-3-ACID ETHYL ESTERS 1 G PO CAPS
2.0000 g | ORAL_CAPSULE | Freq: Two times a day (BID) | ORAL | Status: DC
  Administered 2020-11-18 – 2020-11-24 (×13): 2 g via ORAL
  Filled 2020-11-18 (×13): qty 2

## 2020-11-18 MED ORDER — ACETAMINOPHEN ER 650 MG PO TBCR: 650.0000 mg | EXTENDED_RELEASE_TABLET | Freq: Three times a day (TID) | ORAL | Status: DC | PRN

## 2020-11-18 MED ORDER — PREGABALIN 50 MG PO CAPS
50.0000 mg | ORAL_CAPSULE | Freq: Three times a day (TID) | ORAL | Status: DC
  Administered 2020-11-18 – 2020-11-24 (×19): 50 mg via ORAL
  Filled 2020-11-18 (×19): qty 1

## 2020-11-18 MED ORDER — INSULIN ASPART 100 UNIT/ML IJ SOLN
0.0000 [IU] | Freq: Three times a day (TID) | INTRAMUSCULAR | Status: DC
  Administered 2020-11-18: 1 [IU] via SUBCUTANEOUS
  Administered 2020-11-18 (×2): 2 [IU] via SUBCUTANEOUS
  Administered 2020-11-19 – 2020-11-20 (×4): 1 [IU] via SUBCUTANEOUS
  Administered 2020-11-20: 2 [IU] via SUBCUTANEOUS
  Administered 2020-11-20: 1 [IU] via SUBCUTANEOUS
  Administered 2020-11-21: 3 [IU] via SUBCUTANEOUS
  Administered 2020-11-21 – 2020-11-22 (×2): 2 [IU] via SUBCUTANEOUS
  Administered 2020-11-22: 3 [IU] via SUBCUTANEOUS
  Administered 2020-11-22: 2 [IU] via SUBCUTANEOUS
  Administered 2020-11-23 (×3): 3 [IU] via SUBCUTANEOUS
  Administered 2020-11-24: 2 [IU] via SUBCUTANEOUS

## 2020-11-18 MED ORDER — DILTIAZEM HCL ER COATED BEADS 180 MG PO CP24
360.0000 mg | ORAL_CAPSULE | Freq: Every day | ORAL | Status: DC
  Administered 2020-11-18 – 2020-11-24 (×8): 360 mg via ORAL
  Filled 2020-11-18 (×8): qty 2

## 2020-11-18 MED ORDER — METOPROLOL SUCCINATE ER 100 MG PO TB24
100.0000 mg | ORAL_TABLET | Freq: Every day | ORAL | Status: DC
  Administered 2020-11-18 – 2020-11-24 (×8): 100 mg via ORAL
  Filled 2020-11-18 (×8): qty 1

## 2020-11-18 MED ORDER — SODIUM CHLORIDE 0.9 % IV SOLN: Freq: Once | INTRAVENOUS | Status: DC

## 2020-11-18 MED ORDER — METOPROLOL TARTRATE 5 MG/5ML IV SOLN
5.0000 mg | Freq: Four times a day (QID) | INTRAVENOUS | Status: DC | PRN
  Filled 2020-11-18: qty 5

## 2020-11-18 MED ORDER — SODIUM CHLORIDE 0.9 % IV SOLN: INTRAVENOUS | Status: DC

## 2020-11-18 MED ORDER — ACETAMINOPHEN 325 MG PO TABS
650.0000 mg | ORAL_TABLET | Freq: Four times a day (QID) | ORAL | Status: DC | PRN
  Administered 2020-11-18 – 2020-11-23 (×3): 650 mg via ORAL
  Filled 2020-11-18 (×3): qty 2

## 2020-11-18 MED ORDER — RIVAROXABAN 10 MG PO TABS
20.0000 mg | ORAL_TABLET | Freq: Every day | ORAL | Status: DC
  Administered 2020-11-18 – 2020-11-23 (×6): 20 mg via ORAL
  Filled 2020-11-18 (×6): qty 2

## 2020-11-18 MED ORDER — WHITE PETROLATUM EX OINT
TOPICAL_OINTMENT | CUTANEOUS | Status: AC
  Administered 2020-11-18: 0.2
  Filled 2020-11-18: qty 28.35

## 2020-11-18 NOTE — H&P (Signed)
History and Physical    Pamela [name] FKC:[id number] DOB: Jan 01, 1956 DOA: 11/17/2020  PCP: Corwin Levins, MD  Patient coming from: Home.  Chief Complaint: Left knee pain.  HPI: Pamela [name] is a 65 y.o. female with history of diabetes mellitus type 2, hypertension, A. fib, diastolic CHF, chronic kidney disease followed difficult to walk and get up from the commode 2 days ago.  Patient had to sit on the commode for many hours following which patient had to roll onto the floor to reach the phone and called her brother.  Patient was having significant pain of the left knee.  Patient was brought to the ER.  Patient has not had any food for almost 48 hours.  Also has not taken her medications.  ED Course: In the ER patient had CT abdomen pelvis and x-rays done which did not show anything acute.  Initially patient's heart rate was elevated but improved with fluids.  CK levels were elevated at 700.  Creatinine has worsened from 1.7-2.1.  Patient admitted for acute renal failure with mild rhabdomyolysis and also pain in the left knee.  Review of Systems: As per HPI, rest all negative.   Past Medical History:  Diagnosis Date   ALLERGIC RHINITIS 12/31/2007   ANXIETY 12/31/2007   Atrial fibrillation (HCC)    DEPRESSION 12/31/2007   DIABETES MELLITUS, TYPE II 12/31/2007   Diastolic dysfunction 10/11/2010   Hyperlipidemia 04/23/2012   HYPERTENSION 12/31/2007   Migraine    PNA (pneumonia)    in her 20s   Right knee DJD    Rosacea 10/11/2010    Past Surgical History:  Procedure Laterality Date   2 D&C     1980s     reports that she has never smoked. She has never used smokeless tobacco. She reports current alcohol use. She reports that she does not use drugs.  Allergies  Allergen Reactions   Cymbalta [Duloxetine Hcl]     "loopiness"   Gabapentin Itching    Family History  Problem Relation Age of Onset   Arthritis Other    Cancer Other        breast   Heart disease Other    Stroke  Other    Mental illness Other        emotional   Diabetes Other     Prior to Admission medications   Medication Sig Start Date End Date Taking? Authorizing Provider  acetaminophen (TYLENOL) 650 MG CR tablet Take 650-1,300 mg by mouth every 8 (eight) hours as needed for pain.   Yes [provider]  albuterol (VENTOLIN HFA) 108 (90 Base) MCG/ACT inhaler Inhale 2 puffs into the lungs every 6 (six) hours as needed for wheezing or shortness of breath. 06/21/20  Yes Corwin Levins, MD  atorvastatin (LIPITOR) 40 MG tablet TAKE 1 TABLET(40 MG) BY MOUTH DAILY Patient taking differently: Take 40 mg by mouth daily. 06/21/20  Yes Corwin Levins, MD  cetirizine (ZYRTEC) 10 MG tablet 1 tab by mouth once daily as needed Patient taking differently: Take 10 mg by mouth daily. 06/21/20  Yes Corwin Levins, MD  diltiazem (CARDIZEM CD) 360 MG 24 hr capsule TAKE 1 CAPSULE(360 MG) BY MOUTH DAILY Patient taking differently: Take 360 mg by mouth daily. 06/21/20  Yes Corwin Levins, MD  empagliflozin (JARDIANCE) 25 MG TABS tablet TAKE 1 TABLET BY MOUTH DAILY. Patient taking differently: Take 25 mg by mouth daily. 06/21/20  Yes Corwin Levins, MD  furosemide (  LASIX) 40 MG tablet Take 1 tablet (40 mg total) by mouth daily. Needs appointment for further refills Patient taking differently: Take 40 mg by mouth daily. 09/06/20  Yes Laurey Morale, MD  glipiZIDE (GLUCOTROL XL) 10 MG 24 hr tablet 1 tab by mouth once daily Patient taking differently: Take 10 mg by mouth daily with breakfast. 06/21/20  Yes Corwin Levins, MD  losartan (COZAAR) 50 MG tablet Take 1 tablet (50 mg total) by mouth daily. Needs appt for further refills Patient taking differently: Take 50 mg by mouth daily. 06/21/20  Yes Corwin Levins, MD  meclizine (ANTIVERT) 12.5 MG tablet TAKE 1 TABLET(12.5 MG) BY MOUTH THREE TIMES DAILY AS NEEDED FOR DIZZINESS Patient taking differently: Take 12.5 mg by mouth 3 (three) times daily as needed for dizziness. 09/15/20   Yes Corwin Levins, MD  metFORMIN (GLUCOPHAGE) 1000 MG tablet 1 tab by mouth twice daily Patient taking differently: Take 1,000 mg by mouth 2 (two) times daily with a meal. 06/21/20  Yes Corwin Levins, MD  metoprolol succinate (TOPROL-XL) 100 MG 24 hr tablet Take 1 tablet (100 mg total) by mouth daily. Take with or immediately following a meal. 06/21/20  Yes Corwin Levins, MD  Omega-3 Fatty Acids (FISH OIL) 1000 MG CAPS Take 2 capsules (2,000 mg total) by mouth 2 (two) times daily. 08/19/17  Yes Laurey Morale, MD  pioglitazone (ACTOS) 30 MG tablet Take 1 tablet (30 mg total) by mouth daily. 06/24/20  Yes Corwin Levins, MD  potassium chloride SA (KLOR-CON) 20 MEQ tablet Take 1 tablet (20 mEq total) by mouth 2 (two) times daily. Patient taking differently: Take 20 mEq by mouth daily. 06/21/20  Yes Corwin Levins, MD  pregabalin (LYRICA) 50 MG capsule TAKE 1 CAPSULE(50 MG) BY MOUTH THREE TIMES DAILY Patient taking differently: Take 50 mg by mouth 3 (three) times daily. 06/21/20  Yes Corwin Levins, MD  spironolactone (ALDACTONE) 25 MG tablet TAKE 1 TABLET(25 MG) BY MOUTH DAILY Patient taking differently: Take 25 mg by mouth daily. 06/21/20  Yes Corwin Levins, MD  XARELTO 20 MG TABS tablet TAKE 1 TABLET(20 MG) BY MOUTH DAILY WITH SUPPER. MUST HAVE APPOINTMENT FOR FURTHER REFILLS Patient taking differently: Take 20 mg by mouth daily with supper. 09/21/20  Yes Laurey Morale, MD  Cholecalciferol (THERA-D 2000) 50 MCG (2000 UT) TABS 1 tab by mouth once daily Patient not taking: Reported on 11/18/2020 06/24/20   Corwin Levins, MD  cyclobenzaprine (FLEXERIL) 5 MG tablet Take 1 tablet (5 mg total) by mouth 3 (three) times daily as needed for muscle spasms. Patient not taking: No sig reported 12/01/17   Corwin Levins, MD  traMADol (ULTRAM) 50 MG tablet Take 1 tablet (50 mg total) by mouth every 6 (six) hours as needed. Patient not taking: No sig reported 12/01/17   Corwin Levins, MD    Physical  Exam: Constitutional: Moderately built and nourished. Vitals:   11/17/20 1524 11/17/20 1817 11/17/20 2231 11/18/20 0128  BP:  119/70 136/70 140/80  Pulse:  82 70 82  Resp:  Temp:   98 F (36.7 C)   TempSrc:   Oral   SpO2:  96% 96% 98%  Weight: (!) 158.8 kg     Height: 6' (1.829 m)      Eyes: Anicteric no pallor. ENMT: No discharge from the ears eyes nose and mouth. Neck: No mass felt.  No neck rigidity. Respiratory: No  rhonchi or crepitations. Cardiovascular: S1-S2 heard. Abdomen: Soft nontender bowel sound present. Musculoskeletal: Pain on moving left knee. Skin: No erythema.  Chronic skin changes in the pannus. Neurologic: Alert awake oriented to time place and person.  Moves all extremities. Psychiatric: Appears normal.  Normal affect.   Labs on Admission: I have personally reviewed following labs and imaging studies  CBC: Recent Labs  Lab 11/17/20 1526  WBC 13.1*  NEUTROABS 10.9*  HGB 11.9*  HCT 38.9  MCV 84.6  PLT 299   Basic Metabolic Panel: Recent Labs  Lab 11/17/20 1526  NA 138  K 3.8  CL 104  CO2 24  GLUCOSE 159*  BUN 39*  CREATININE 2.15*  CALCIUM 9.7   GFR: Estimated Creatinine Clearance: 44.8 mL/min (A) (by C-G formula based on SCr of 2.15 mg/dL (H)). Liver Function Tests: No results for input(s): AST, ALT, ALKPHOS, BILITOT, PROT, ALBUMIN in the last 168 hours. No results for input(s): LIPASE, AMYLASE in the last 168 hours. No results for input(s): AMMONIA in the last 168 hours. Coagulation Profile: No results for input(s): INR, PROTIME in the last 168 hours. Cardiac Enzymes: Recent Labs  Lab 11/17/20 2332  CKTOTAL 787*   BNP (last 3 results) Recent Labs    03/20/20 1145 06/21/20 1129  PROBNP 125.0* 139.0*   HbA1C: No results for input(s): HGBA1C in the last 72 hours. CBG: Recent Labs  Lab 11/17/20 1526  GLUCAP 144*   Lipid Profile: No results for input(s): CHOL, HDL, LDLCALC, TRIG, CHOLHDL, LDLDIRECT in the last  72 hours. Thyroid Function Tests: No results for input(s): TSH, T4TOTAL, FREET4, T3FREE, THYROIDAB in the last 72 hours. Anemia Panel: No results for input(s): VITAMINB12, FOLATE, FERRITIN, TIBC, IRON, RETICCTPCT in the last 72 hours. Urine analysis:    Component Value Date/Time   COLORURINE YELLOW 06/21/2020 1129   APPEARANCEUR CLEAR 06/21/2020 1129   LABSPEC 1.010 06/21/2020 1129   PHURINE 5.5 06/21/2020 1129   GLUCOSEU >=1000 (A) 06/21/2020 1129   HGBUR NEGATIVE 06/21/2020 1129   BILIRUBINUR NEGATIVE 06/21/2020 1129   KETONESUR NEGATIVE 06/21/2020 1129   UROBILINOGEN 0.2 06/21/2020 1129   NITRITE NEGATIVE 06/21/2020 1129   LEUKOCYTESUR NEGATIVE 06/21/2020 1129   Sepsis Labs: @LABRCNTIP (procalcitonin:4,lacticidven:4) ) Recent Results (from the past 240 hour(s))  Resp Panel by RT-PCR (Flu A&B, Covid) Nasopharyngeal Swab     Status: None   Collection Time: 11/18/20  2:32 AM   Specimen: Nasopharyngeal Swab; Nasopharyngeal(NP) swabs in vial transport medium  Result Value Ref Range Status   SARS Coronavirus 2 by RT PCR NEGATIVE NEGATIVE Final    Comment: (NOTE) SARS-CoV-2 target nucleic acids are NOT DETECTED.  The SARS-CoV-2 RNA is generally detectable in upper respiratory specimens during the acute phase of infection. The lowest concentration of SARS-CoV-2 viral copies this assay can detect is 138 copies/mL. A negative result does not preclude SARS-Cov-2 infection and should not be used as the sole basis for treatment or other patient management decisions. A negative result may occur with  improper specimen collection/handling, submission of specimen other than nasopharyngeal swab, presence of viral mutation(s) within the areas targeted by this assay, and inadequate number of viral copies(<138 copies/mL). A negative result must be combined with clinical observations, patient history, and epidemiological information. The expected result is Negative.  Fact Sheet for  Patients:  01/18/21  Fact Sheet for Healthcare Providers:  BloggerCourse.com  This test is no t yet approved or cleared by the [place] FDA and  has been authorized for detection and/or  diagnosis of SARS-CoV-2 by FDA under an Emergency Use Authorization (EUA). This EUA will remain  in effect (meaning this test can be used) for the duration of the COVID-19 declaration under Section 564(b)(1) of the Act, 21 U.S.C.section 360bbb-3(b)(1), unless the authorization is terminated  or revoked sooner.       Influenza A by PCR NEGATIVE NEGATIVE Final   Influenza B by PCR NEGATIVE NEGATIVE Final    Comment: (NOTE) The Xpert Xpress SARS-CoV-2/FLU/RSV plus assay is intended as an aid in the diagnosis of influenza from Nasopharyngeal swab specimens and should not be used as a sole basis for treatment. Nasal washings and aspirates are unacceptable for Xpert Xpress SARS-CoV-2/FLU/RSV testing.  Fact Sheet for Patients: BloggerCourse.comhttps://www.fda.gov/media/152166/download  Fact Sheet for Healthcare Providers: SeriousBroker.ithttps://www.fda.gov/media/152162/download  This test is not yet approved or cleared by the [place] FDA and has been authorized for detection and/or diagnosis of SARS-CoV-2 by FDA under an Emergency Use Authorization (EUA). This EUA will remain in effect (meaning this test can be used) for the duration of the COVID-19 declaration under Section 564(b)(1) of the Act, 21 U.S.C. section 360bbb-3(b)(1), unless the authorization is terminated or revoked.  Performed at [facility], [address] [place], [place] [address]      Radiological Exams on Admission: CT ABDOMEN PELVIS WO CONTRAST  Result Date: 11/18/2020 CLINICAL DATA:  Right lower quadrant abdominal pain EXAM: CT ABDOMEN AND PELVIS WITHOUT CONTRAST TECHNIQUE: Multidetector CT imaging of the abdomen and pelvis was performed following the standard protocol without IV  contrast. COMPARISON:  None. FINDINGS: Lower chest: Cardiomegaly.  No acute abnormality. Hepatobiliary: No focal hepatic abnormality. Gallbladder unremarkable. Pancreas: No focal abnormality or ductal dilatation. Spleen: No focal abnormality.  Normal size. Adrenals/Urinary Tract: Prominent soft tissue area in the midpole of the right kidney measures 2.5 cm and cannot be characterized on this noncontrast study. No hydronephrosis. No renal or ureteral stones. Adrenal glands and urinary bladder unremarkable. Stomach/Bowel: Diffuse colonic diverticulosis. No active diverticulitis. Stomach and small bowel decompressed, unremarkable. Vascular/Lymphatic: Aortic atherosclerosis. No evidence of aneurysm or adenopathy. Reproductive: Uterus and adnexa unremarkable.  No mass. Other: No free fluid or free air. Musculoskeletal: No acute bony abnormality. IMPRESSION: Colonic diverticulosis.  No active diverticulitis. Cardiomegaly. Prominent soft tissue area in the parapelvic midpole of the right kidney, difficult to characterize on this unenhanced study. This could be further evaluated with contrast-enhanced CT or ultrasound. No acute findings in the abdomen or pelvis. Electronically Signed   By: Charlett NoseKevin  Dover M.D.   On: 11/18/2020 01:10   DG Lumbar Spine Complete  Result Date: 11/18/2020 CLINICAL DATA:  Left hip pain radiating to the left knee EXAM: LUMBAR SPINE - COMPLETE 4+ VIEW COMPARISON:  None. FINDINGS: Normal alignment. Diffuse degenerative disc and facet disease. No fracture. SI joints symmetric and unremarkable. IMPRESSION: Diffuse degenerative disc and facet disease. Electronically Signed   By: Charlett NoseKevin  Dover M.D.   On: 11/18/2020 00:17   DG Knee Complete 4 Views Left  Result Date: 11/18/2020 CLINICAL DATA:  Pain EXAM: LEFT KNEE - COMPLETE 4+ VIEW COMPARISON:  None. FINDINGS: Advanced tricompartment degenerative changes most pronounced in the medial and patellofemoral compartments. No joint effusion. No acute bony  abnormality. Specifically, no fracture, subluxation, or dislocation. IMPRESSION: Advanced tricompartment degenerative changes. No acute bony abnormality. Electronically Signed   By: Charlett NoseKevin  Dover M.D.   On: 11/18/2020 00:16   DG Hip Unilat W or Wo Pelvis 2-3 Views Left  Result Date: 11/18/2020 CLINICAL DATA:  Left hip  pain radiating to left knee. EXAM: DG HIP (WITH OR WITHOUT PELVIS) 2-3V LEFT COMPARISON:  None. FINDINGS: Degenerative changes in the hips bilaterally. No acute bony abnormality. Specifically, no fracture, subluxation, or dislocation. IMPRESSION: No acute bony abnormality. Electronically Signed   By: Charlett Nose M.D.   On: 11/18/2020 00:16    EKG: Independently reviewed.  A. fib with RVR.  Assessment/Plan Principal Problem:   ARF (acute renal failure) (HCC) Active Problems:   Diabetes (HCC)   Elevated CK   Acute pain of left knee    Acute on chronic kidney disease stage III likely worsened because of patient not having any food for the last 48 hours dehydrated we will hold Lasix ARB patient did receive final bolus of normal saline.  We will recheck metabolic panel. Mild rhabdomyolysis likely from patient unable to walk and had to hold onto the floor.  We will recheck CK levels.  Did not receive fluids. Left knee pain could be from osteoarthritis.  Consult orthopedics in the morning.  Check uric acid levels. A. fib presently controlled.  Initially was in RVR.  On metoprolol Cardizem and Xarelto. Diastolic CHF holding Lasix due to worsening renal function and appears dehydrated. Diabetes mellitus type 2 we will keep patient on sliding scale coverage. Hypertension on metoprolol Cardizem holding losartan due to renal failure. Chronic skin changes of the abdominal pannus.  Will get wound team consult.   DVT prophylaxis: Xarelto. Code Status: Full code. Family Communication: Discussed with patient. Disposition Plan: To be determined. Consults called: Physical  therapy. Admission status: Observation.   Eduard Clos MD [facility] Pager [phone].  If 7PM-7AM, please contact night-coverage [email or web address] Password [facility]  11/18/2020, 4:41 AM

## 2020-11-18 NOTE — Plan of Care (Signed)
  Problem: Clinical Measurements: Goal: Diagnostic test results will improve Outcome: Progressing   

## 2020-11-18 NOTE — Progress Notes (Signed)
New Admission Note:   Arrival Method: Stretcher Mental Orientation:alert and oriented x 4 Telemetry: box 16 Assessment: Completed Skin: see flow sheet IV:NSL Pain: none Tubes: none Safety Measures: Safety Fall Prevention Plan has been discussed Admission: Completed [facility] Orientation: Patient has been orientated to the room, unit and staff.  Family: none at bedside  Orders have been reviewed and implemented. Will continue to monitor the patient. Call light has been placed within reach and bed alarm has been activated.   Artemio Aly BSN, RN Phone number: [phone]

## 2020-11-18 NOTE — Progress Notes (Signed)
Pt heartrate 120-150 nonsustained since 0615,pt on tele running Afibgave pt her morning dose of metoprol succinate 100mg  and Cardizem CD 24 capsule 360mg ,pt is asymptomatic no c/o pain/pt stated she had not taken her meds in 24 hours,DR Rathore notified no orders given ,will paas on to the first shift nurse.

## 2020-11-18 NOTE — Progress Notes (Signed)
PROGRESS NOTE    Pamela [name]  BZJ:[id number] DOB: [birth year] DOA: 11/17/2020 PCP: Corwin Levins, MD   Brief Narrative:  HPI: Pamela [name] is a 65 y.o. female with history of diabetes mellitus type 2, hypertension, A. fib, diastolic CHF, chronic kidney disease followed difficult to walk and get up from the commode 2 days ago.  Patient had to sit on the commode for many hours following which patient had to roll onto the floor to reach the phone and called her brother.  Patient was having significant pain of the left knee.  Patient was brought to the ER.  Patient has not had any food for almost 48 hours.  Also has not taken her medications.   ED Course: In the ER patient had CT abdomen pelvis and x-rays done which did not show anything acute.  Initially patient's heart rate was elevated but improved with fluids.  CK levels were elevated at 700.  Creatinine has worsened from 1.7-2.1.  Patient admitted for acute renal failure with mild rhabdomyolysis and also pain in the left knee.  Assessment & Plan:   Principal Problem:   ARF (acute renal failure) (HCC) Active Problems:   Diabetes (HCC)   Elevated CK   Acute pain of left knee  AKI on CKD stage IIIb: Her baseline creatinine seems to be around 1.5 with GFR 38.  Creatinine at presentation was 2.15.  Improving but not back to baseline.  Will resume IV fluids.  Recheck labs in the morning.  Mild rhabdomyolysis: We will resume IV fluids.  Repeat CK in the morning.  Generalized debility secondary to left knee pain: Per patient, the pain is ongoing for several years and she is already aware that she has osteoarthritis.  Despite of this, she has never sought any medical care from any orthopedics because she is afraid to have knee replacement surgery in case that is recommended.  She was highly advised to follow-up with orthopedics as outpatient as this is not an emergency and knee replacement might be needed but can be done as elective procedure.   She was seen by PT OT and to my suspicion, they recommend SNF.  She lives by herself.  Paroxysmal atrial fibrillation: Initially was in RVR, currently rate slightly elevated despite of receiving metoprolol and Cardizem.  Will monitor and place as needed Lopressor to be given if she has sustained heart rate of more than 120.  Continue Xarelto.  Chronic diastolic congestive heart failure: Looks slightly dehydrated.  IV fluids.  Hold diuretics.  Essential hypertension: Continue Cardizem and metoprolol but hold losartan due to AKI.  Type 2 diabetes mellitus: Takes glipizide at home.  Recent hemoglobin A1c in January of this year was 9.9.  Recheck hemoglobin A1c and continue SSI.  Blood sugars labile but fairly controlled  DVT prophylaxis: rivaroxaban (XARELTO) tablet 20 mg Start: 11/18/20 1700   Code Status: Full Code  Family Communication:  None present at bedside.  Plan of care discussed with patient in length and he verbalized understanding and agreed with it.  Status is: Observation  The patient will require care spanning > 2 midnights and should be moved to inpatient because: Unsafe d/c plan  Dispo: The patient is from: Home              Anticipated d/c is to: SNF              Patient currently is not medically stable to d/c.   Difficult to place patient No  Estimated body mass index is 47.47 kg/m as calculated from the following:   Height as of this encounter: 6' (1.829 m).   Weight as of this encounter: 158.8 kg.  Pressure Injury 11/18/20 Buttocks Left Deep Tissue Pressure Injury - Purple or maroon localized area of discolored intact skin or blood-filled blister due to damage of underlying soft tissue from pressure and/or shear. (Active)  11/18/20 0400  Location: Buttocks  Location Orientation: Left  Staging: Deep Tissue Pressure Injury - Purple or maroon localized area of discolored intact skin or blood-filled blister due to damage of underlying soft tissue from  pressure and/or shear.  Wound Description (Comments):   Present on Admission: Yes     Nutritional status:               Consultants:  None  Procedures:  None  Antimicrobials:  Anti-infectives (From admission, onward)    None          Subjective: Seen and examined.  No new complaint.  Chronic left hip and left knee pain.  Objective: Vitals:   11/17/20 2231 11/18/20 0128 11/18/20 0447 11/18/20 1003  BP: 136/70 140/80 123/84 122/75  Pulse: 70 82 95 99  Resp: 15 17 18 18   Temp: 98 F (36.7 C)  98.7 F (37.1 C) 98.7 F (37.1 C)  TempSrc: Oral     SpO2: 96% 98% 90% 92%  Weight:      Height:        Intake/Output Summary (Last 24 hours) at 11/18/2020 1046 Last data filed at 11/18/2020 1034 Gross per 24 hour  Intake 178.06 ml  Output --  Net 178.06 ml   Filed Weights   11/17/20 1524  Weight: (!) 158.8 kg    Examination:  General exam: Appears calm and comfortable, morbidly obese Respiratory system: Clear to auscultation. Respiratory effort normal. Cardiovascular system: S1 & S2 heard, RRR. No JVD, murmurs, rubs, gallops or clicks.  Chronic nonpitting edema bilateral lower extremity Gastrointestinal system: Abdomen is nondistended, soft and nontender. No organomegaly or masses felt. Normal bowel sounds heard. Central nervous system: Alert and oriented. No focal neurological deficits. Extremities: Symmetric 5 x 5 power. Skin: No rashes, lesions or ulcers Psychiatry: Judgement and insight appear normal. Mood & affect appropriate.    Data Reviewed: I have personally reviewed following labs and imaging studies  CBC: Recent Labs  Lab 11/17/20 1526 11/18/20 0548  WBC 13.1* 11.1*  NEUTROABS 10.9* 8.8*  HGB 11.9* 11.1*  HCT 38.9 36.5  MCV 84.6 84.7  PLT 299 264   Basic Metabolic Panel: Recent Labs  Lab 11/17/20 1526 11/18/20 0548  NA 138 140  K 3.8 3.7  CL 104 105  CO2 24 23  GLUCOSE 159* 163*  BUN 39* 36*  CREATININE 2.15* 1.96*   CALCIUM 9.7 9.2   GFR: Estimated Creatinine Clearance: 49.2 mL/min (A) (by C-G formula based on SCr of 1.96 mg/dL (H)). Liver Function Tests: Recent Labs  Lab 11/18/20 0548  AST 27  ALT 21  ALKPHOS 53  BILITOT 1.2  PROT 6.5  ALBUMIN 3.2*   No results for input(s): LIPASE, AMYLASE in the last 168 hours. No results for input(s): AMMONIA in the last 168 hours. Coagulation Profile: No results for input(s): INR, PROTIME in the last 168 hours. Cardiac Enzymes: Recent Labs  Lab 11/17/20 2332 11/18/20 0548  CKTOTAL 787* 668*   BNP (last 3 results) Recent Labs    03/20/20 1145 06/21/20 1129  PROBNP 125.0* 139.0*   HbA1C: No results  for input(s): HGBA1C in the last 72 hours. CBG: Recent Labs  Lab 11/17/20 1526 11/18/20 0441 11/18/20 0721  GLUCAP 144* 111* 172*   Lipid Profile: No results for input(s): CHOL, HDL, LDLCALC, TRIG, CHOLHDL, LDLDIRECT in the last 72 hours. Thyroid Function Tests: No results for input(s): TSH, T4TOTAL, FREET4, T3FREE, THYROIDAB in the last 72 hours. Anemia Panel: No results for input(s): VITAMINB12, FOLATE, FERRITIN, TIBC, IRON, RETICCTPCT in the last 72 hours. Sepsis Labs: No results for input(s): PROCALCITON, LATICACIDVEN in the last 168 hours.  Recent Results (from the past 240 hour(s))  Resp Panel by RT-PCR (Flu A&B, Covid) Nasopharyngeal Swab     Status: None   Collection Time: 11/18/20  2:32 AM   Specimen: Nasopharyngeal Swab; Nasopharyngeal(NP) swabs in vial transport medium  Result Value Ref Range Status   SARS Coronavirus 2 by RT PCR NEGATIVE NEGATIVE Final    Comment: (NOTE) SARS-CoV-2 target nucleic acids are NOT DETECTED.  The SARS-CoV-2 RNA is generally detectable in upper respiratory specimens during the acute phase of infection. The lowest concentration of SARS-CoV-2 viral copies this assay can detect is 138 copies/mL. A negative result does not preclude SARS-Cov-2 infection and should not be used as the sole basis for  treatment or other patient management decisions. A negative result may occur with  improper specimen collection/handling, submission of specimen other than nasopharyngeal swab, presence of viral mutation(s) within the areas targeted by this assay, and inadequate number of viral copies(<138 copies/mL). A negative result must be combined with clinical observations, patient history, and epidemiological information. The expected result is Negative.  Fact Sheet for Patients:  BloggerCourse.com  Fact Sheet for Healthcare Providers:  SeriousBroker.it  This test is no t yet approved or cleared by the [place] FDA and  has been authorized for detection and/or diagnosis of SARS-CoV-2 by FDA under an Emergency Use Authorization (EUA). This EUA will remain  in effect (meaning this test can be used) for the duration of the COVID-19 declaration under Section 564(b)(1) of the Act, 21 U.S.C.section 360bbb-3(b)(1), unless the authorization is terminated  or revoked sooner.       Influenza A by PCR NEGATIVE NEGATIVE Final   Influenza B by PCR NEGATIVE NEGATIVE Final    Comment: (NOTE) The Xpert Xpress SARS-CoV-2/FLU/RSV plus assay is intended as an aid in the diagnosis of influenza from Nasopharyngeal swab specimens and should not be used as a sole basis for treatment. Nasal washings and aspirates are unacceptable for Xpert Xpress SARS-CoV-2/FLU/RSV testing.  Fact Sheet for Patients: BloggerCourse.com  Fact Sheet for Healthcare Providers: SeriousBroker.it  This test is not yet approved or cleared by the [place] FDA and has been authorized for detection and/or diagnosis of SARS-CoV-2 by FDA under an Emergency Use Authorization (EUA). This EUA will remain in effect (meaning this test can be used) for the duration of the COVID-19 declaration under Section 564(b)(1) of the Act, 21  U.S.C. section 360bbb-3(b)(1), unless the authorization is terminated or revoked.  Performed at [facility], [address], [place], [place] [address]       Radiology Studies: CT ABDOMEN PELVIS WO CONTRAST  Result Date: 11/18/2020 CLINICAL DATA:  Right lower quadrant abdominal pain EXAM: CT ABDOMEN AND PELVIS WITHOUT CONTRAST TECHNIQUE: Multidetector CT imaging of the abdomen and pelvis was performed following the standard protocol without IV contrast. COMPARISON:  None. FINDINGS: Lower chest: Cardiomegaly.  No acute abnormality. Hepatobiliary: No focal hepatic abnormality. Gallbladder unremarkable. Pancreas: No focal abnormality or ductal dilatation. Spleen:  No focal abnormality.  Normal size. Adrenals/Urinary Tract: Prominent soft tissue area in the midpole of the right kidney measures 2.5 cm and cannot be characterized on this noncontrast study. No hydronephrosis. No renal or ureteral stones. Adrenal glands and urinary bladder unremarkable. Stomach/Bowel: Diffuse colonic diverticulosis. No active diverticulitis. Stomach and small bowel decompressed, unremarkable. Vascular/Lymphatic: Aortic atherosclerosis. No evidence of aneurysm or adenopathy. Reproductive: Uterus and adnexa unremarkable.  No mass. Other: No free fluid or free air. Musculoskeletal: No acute bony abnormality. IMPRESSION: Colonic diverticulosis.  No active diverticulitis. Cardiomegaly. Prominent soft tissue area in the parapelvic midpole of the right kidney, difficult to characterize on this unenhanced study. This could be further evaluated with contrast-enhanced CT or ultrasound. No acute findings in the abdomen or pelvis. Electronically Signed   By: Charlett Nose M.D.   On: 11/18/2020 01:10   DG Lumbar Spine Complete  Result Date: 11/18/2020 CLINICAL DATA:  Left hip pain radiating to the left knee EXAM: LUMBAR SPINE - COMPLETE 4+ VIEW COMPARISON:  None. FINDINGS: Normal alignment. Diffuse degenerative disc and facet  disease. No fracture. SI joints symmetric and unremarkable. IMPRESSION: Diffuse degenerative disc and facet disease. Electronically Signed   By: Charlett Nose M.D.   On: 11/18/2020 00:17   DG Knee Complete 4 Views Left  Result Date: 11/18/2020 CLINICAL DATA:  Pain EXAM: LEFT KNEE - COMPLETE 4+ VIEW COMPARISON:  None. FINDINGS: Advanced tricompartment degenerative changes most pronounced in the medial and patellofemoral compartments. No joint effusion. No acute bony abnormality. Specifically, no fracture, subluxation, or dislocation. IMPRESSION: Advanced tricompartment degenerative changes. No acute bony abnormality. Electronically Signed   By: Charlett Nose M.D.   On: 11/18/2020 00:16   DG Hip Unilat W or Wo Pelvis 2-3 Views Left  Result Date: 11/18/2020 CLINICAL DATA:  Left hip pain radiating to left knee. EXAM: DG HIP (WITH OR WITHOUT PELVIS) 2-3V LEFT COMPARISON:  None. FINDINGS: Degenerative changes in the hips bilaterally. No acute bony abnormality. Specifically, no fracture, subluxation, or dislocation. IMPRESSION: No acute bony abnormality. Electronically Signed   By: Charlett Nose M.D.   On: 11/18/2020 00:16    Scheduled Meds:  diltiazem  360 mg Oral Daily   insulin aspart  0-9 Units Subcutaneous TID WC   metoprolol succinate  100 mg Oral Daily   omega-3 acid ethyl esters  2 g Oral BID   pregabalin  50 mg Oral TID   rivaroxaban  20 mg Oral Q supper   Continuous Infusions:  sodium chloride 125 mL/hr at 11/18/20 1034   sodium chloride       LOS: 0 days   Time spent: 35 minutes   Hughie Closs, MD [facility]  11/18/2020, 10:46 AM   How to contact the [facility] Attending or Consulting provider 7A - 7P or covering provider during after hours 7P -7A, for this patient?  Check the care team in [facility] and look for a) attending/consulting [facility] provider listed and b) the [facility] team listed. Page or secure chat 7A-7P. Log into [email or web address] and use [place]'s universal password to access. If  you do not have the password, please contact the hospital operator. Locate the [facility] provider you are looking for under [facility] and page to a number that you can be directly reached. If you still have difficulty reaching the provider, please page the [facility] (Director on Call) for the Hospitalists listed on amion for assistance.

## 2020-11-18 NOTE — Consult Note (Signed)
WOC Nurse Consult Note: Patient receiving care in [facility] [phone] Reason for Consult: MASD in the groin and under the breast. Clean in the groin area and under the breast with soap and water and thoroughly dry.  Measure and cut length of InterDry to fit in skin folds that have skin breakdown Tuck InterDry fabric into skin folds in a single layer, allow for 2 inches of overhang from skin edges to allow for wicking to occur May remove to bathe; dry area thoroughly and then tuck into affected areas again DO NOT APPLY any creams or ointments when using InterDry DO NOT THROW AWAY FOR 5 DAYS unless soiled with stool DO NOT [facility] product, this will inactivate the silver in the material  New sheet of Interdry should be applied after 5 days of use if patient continues to have skin breakdown [place] [phone]    Monitor the wound area(s) for worsening of condition such as: Signs/symptoms of infection, increase in size, development of or worsening of odor, development of pain, or increased pain at the affected locations.   Notify the medical team if any of these develop.  Thank you for the consult. WOC nurse will not follow at this time.   Please re-consult the WOC team if needed.  Renaldo Reel Katrinka Blazing, MSN, RN, CMSRN, Angus Seller, [facility] Wound Treatment Associate Pager [phone]

## 2020-11-18 NOTE — Evaluation (Signed)
Physical Therapy Evaluation Patient Details Name: Pamela [name] MRN: [id number] DOB: 03/21/1956 Today's Date: 11/18/2020   History of Present Illness  Pt is a 65 y/o female admitted secondary to difficulty walking and unable to get up from the commode at home.  Patient had to sit on the commode for many hours following which patient had to roll onto the floor to reach the phone and called her brother.  Patient was having significant pain of the left knee.  She has not had any food for almost 48 hours and has not taken her medications. PMH including but not limited to DM, HTN, A. fib, diastolic CHF, CKD.  Clinical Impression  Pt presented supine in bed with HOB elevated, awake and willing to participate in therapy session. Prior to admission, pt reported that she was fiercely independent with all functional mobility and ADLs. Pt lives alone in a single level apartment with a level entry. At the time of evaluation, pt able to perform bed mobility with supervision and sit upright at EOB without UE supports for several minutes without needing any physical assistance. However, she was unable to successfully perform a transfer, stand or ambulate this session. Based on pt's current functional mobility status and lack of family support at home, PT recommending that pt d/c to a SNF for further intensive therapy services prior to returning home. Pt is agreeable. Pt would continue to benefit from skilled physical therapy services at this time while admitted and after d/c to address the below listed limitations in order to improve overall safety and independence with functional mobility.     Follow Up Recommendations SNF    Equipment Recommendations  None recommended by PT    Recommendations for Other Services       Precautions / Restrictions Precautions Precautions: Fall Restrictions Weight Bearing Restrictions: No      Mobility  Bed Mobility Overal bed mobility: Needs Assistance Bed Mobility:  Supine to Sit;Sit to Supine     Supine to sit: Supervision Sit to supine: Supervision   General bed mobility comments: greatly increased time and effort needed secondary to pain, HOB elevated, use of bed rails, supervision for safety    Transfers                 General transfer comment: unable to perform despite multiple attempts; unsafe with only one person and pt stating multiple times "I can't do it"  Ambulation/Gait                Stairs            Wheelchair Mobility    Modified Rankin (Stroke Patients Only)       Balance Overall balance assessment: Needs assistance Sitting-balance support: Feet supported Sitting balance-[name]: Fair                                       Pertinent Vitals/Pain Pain Assessment: Faces Faces Pain Scale: Hurts whole lot Pain Location: L hip and knee Pain Descriptors / Indicators: Grimacing;Guarding Pain Intervention(s): Monitored during session;Repositioned    Home Living Family/patient expects to be discharged to:: Private residence Living Arrangements: Alone Available Help at Discharge: Family;Available PRN/intermittently Type of Home: Apartment Home Access: Level entry     Home Layout: One level Home Equipment: Grab bars - tub/shower      Prior Function Level of Independence: Independent  Comments: wears glasses, works     Higher education careers adviser        Extremity/Trunk Assessment   Upper Extremity Assessment Upper Extremity Assessment: Overall WFL for tasks assessed    Lower Extremity Assessment Lower Extremity Assessment: Generalized weakness       Communication   Communication: No difficulties  Cognition Arousal/Alertness: Awake/alert Behavior During Therapy: WFL for tasks assessed/performed Overall Cognitive Status: Within Functional Limits for tasks assessed                                        General Comments      Exercises      Assessment/Plan    PT Assessment Patient needs continued PT services  PT Problem List Decreased strength;Decreased activity tolerance;Decreased balance;Decreased mobility;Decreased coordination;Decreased knowledge of use of DME;Decreased safety awareness;Decreased knowledge of precautions;Pain       PT Treatment Interventions DME instruction;Gait training;Functional mobility training;Therapeutic activities;Neuromuscular re-education;Balance training;Therapeutic exercise;Patient/family education    PT Goals (Current goals can be found in the Care Plan section)  Acute Rehab PT Goals Patient Stated Goal: decrease pain PT Goal Formulation: With patient Time For Goal Achievement: 12/02/20 Potential to Achieve Goals: Good    Frequency Min 2X/week   Barriers to discharge Decreased caregiver support      Co-evaluation               AM-PAC PT "6 Clicks" Mobility  Outcome Measure Help needed turning from your back to your side while in a flat bed without using bedrails?: None Help needed moving from lying on your back to sitting on the side of a flat bed without using bedrails?: None Help needed moving to and from a bed to a chair (including a wheelchair)?: Total Help needed standing up from a chair using your arms (e.g., wheelchair or bedside chair)?: A Lot Help needed to walk in hospital room?: Total Help needed climbing 3-5 steps with a railing? : Total 6 Click Score: 13    End of Session Equipment Utilized During Treatment: Gait belt Activity Tolerance: Patient limited by pain Patient left: in bed;with call bell/phone within reach Nurse Communication: Mobility status PT Visit Diagnosis: Other abnormalities of gait and mobility (R26.89);Pain Pain - Right/Left: Left Pain - part of body: Knee;Hip    Time: 0920-0943 PT Time Calculation (min) (ACUTE ONLY): 23 min   Charges:   PT Evaluation $PT Eval Moderate Complexity: 1 Mod PT Treatments $Therapeutic Activity: 8-22  mins        Arletta Bale, DPT  Acute Rehabilitation Services Pager [phone] Office [phone]   Alessandra Bevels Haroun Cotham 11/18/2020, 10:28 AM

## 2020-11-19 LAB — CBC WITH DIFFERENTIAL/PLATELET
Abs Immature Granulocytes: 0.06 10*3/uL (ref 0.00–0.07)
Basophils Absolute: 0.1 10*3/uL (ref 0.0–0.1)
Basophils Relative: 1 %
Eosinophils Absolute: 0.3 10*3/uL (ref 0.0–0.5)
Eosinophils Relative: 4 %
HCT: 34.2 % — ABNORMAL LOW (ref 36.0–46.0)
Hemoglobin: 10.6 g/dL — ABNORMAL LOW (ref 12.0–15.0)
Immature Granulocytes: 1 %
Lymphocytes Relative: 15 %
Lymphs Abs: 1.3 10*3/uL (ref 0.7–4.0)
MCH: 26.2 pg (ref 26.0–34.0)
MCHC: 31 g/dL (ref 30.0–36.0)
MCV: 84.7 fL (ref 80.0–100.0)
Monocytes Absolute: 0.8 10*3/uL (ref 0.1–1.0)
Monocytes Relative: 9 %
Neutro Abs: 6.2 10*3/uL (ref 1.7–7.7)
Neutrophils Relative %: 70 %
Platelets: 227 10*3/uL (ref 150–400)
RBC: 4.04 MIL/uL (ref 3.87–5.11)
RDW: 15.8 % — ABNORMAL HIGH (ref 11.5–15.5)
WBC: 8.8 10*3/uL (ref 4.0–10.5)
nRBC: 0 % (ref 0.0–0.2)

## 2020-11-19 LAB — GLUCOSE, CAPILLARY
Glucose-Capillary: 137 mg/dL — ABNORMAL HIGH (ref 70–99)
Glucose-Capillary: 133 mg/dL — ABNORMAL HIGH (ref 70–99)
Glucose-Capillary: 123 mg/dL — ABNORMAL HIGH (ref 70–99)
Glucose-Capillary: 132 mg/dL — ABNORMAL HIGH (ref 70–99)

## 2020-11-19 LAB — BASIC METABOLIC PANEL
Anion gap: 10 (ref 5–15)
BUN: 32 mg/dL — ABNORMAL HIGH (ref 8–23)
CO2: 21 mmol/L — ABNORMAL LOW (ref 22–32)
Calcium: 8.5 mg/dL — ABNORMAL LOW (ref 8.9–10.3)
Chloride: 107 mmol/L (ref 98–111)
Creatinine, Ser: 1.83 mg/dL — ABNORMAL HIGH (ref 0.44–1.00)
GFR, Estimated: 30 mL/min — ABNORMAL LOW (ref >60)
Glucose, Bld: 154 mg/dL — ABNORMAL HIGH (ref 70–99)
Potassium: 3.6 mmol/L (ref 3.5–5.1)
Sodium: 138 mmol/L (ref 135–145)

## 2020-11-19 LAB — CK: Total CK: 303 U/L — ABNORMAL HIGH (ref 38–234)

## 2020-11-19 NOTE — Plan of Care (Signed)
  Problem: Nutrition: Goal: Adequate nutrition will be maintained Outcome: Progressing   Problem: Elimination: Goal: Will not experience complications related to urinary retention Outcome: Progressing   

## 2020-11-19 NOTE — Progress Notes (Signed)
PROGRESS NOTE    Pamela [name]  GBT:[id number] DOB: 28-Sep-1955 DOA: 11/17/2020 PCP: Corwin Levins, MD   Brief Narrative:  HPI: Pamela [name] is a 65 y.o. female with history of diabetes mellitus type 2, hypertension, A. fib, diastolic CHF, chronic kidney disease followed difficult to walk and get up from the commode 2 days ago.  Patient had to sit on the commode for many hours following which patient had to roll onto the floor to reach the phone and called her brother.  Patient was having significant pain of the left knee.  Patient was brought to the ER.  Patient has not had any food for almost 48 hours.  Also has not taken her medications.   ED Course: In the ER patient had CT abdomen pelvis and x-rays done which did not show anything acute.  Initially patient's heart rate was elevated but improved with fluids.  CK levels were elevated at 700.  Creatinine has worsened from 1.7-2.1.  Patient admitted for acute renal failure with mild rhabdomyolysis and also pain in the left knee.  Assessment & Plan:   Principal Problem:   ARF (acute renal failure) (HCC) Active Problems:   Diabetes (HCC)   Elevated CK   Acute pain of left knee   AKI (acute kidney injury) (HCC)  AKI on CKD stage IIIb: Her baseline creatinine seems to be around 1.5 with GFR 38.  Creatinine at presentation was 2.15.  Improving but not back to baseline.  Will continue IV fluids.  Repeat labs in the morning.  Mild rhabdomyolysis: Resolved.  Generalized debility secondary to left knee pain: Per patient, the pain is ongoing for several years and she is already aware that she has osteoarthritis.  Despite of this, she has never sought any medical care from any orthopedics because she is afraid to have knee replacement surgery in case that is recommended.  She was highly advised to follow-up with orthopedics as outpatient as this is not an emergency and knee replacement might be needed but can be done as elective procedure.  She was  seen by PT OT and they recommend SNF.  TOC aware.  Paroxysmal atrial fibrillation: Rates controlled.  Continue home dose of Cardizem, metoprolol and Xarelto.  Chronic diastolic congestive heart failure: Looks slightly dehydrated.  Needs some more gentle hydration for today.  Holding diuretics.  Essential hypertension: Continue Cardizem and metoprolol but hold losartan due to AKI.  Type 2 diabetes mellitus: Takes glipizide at home.  Recent hemoglobin A1c in January of this year was 9.9.  Repeat hemoglobin A1c pending.  Blood sugar controlled.  Continue SSI.  DVT prophylaxis: rivaroxaban (XARELTO) tablet 20 mg Start: 11/18/20 1700   Code Status: Full Code  Family Communication:  None present at bedside.  Plan of care discussed with patient in length and he verbalized understanding and agreed with it.  Status is: Inpatient  Remains inpatient appropriate because:Unsafe d/c plan  Dispo: The patient is from: Home              Anticipated d/c is to: SNF              Patient currently is medically stable to d/c.   Difficult to place patient No         Estimated body mass index is 47.47 kg/m as calculated from the following:   Height as of this encounter: 6' (1.829 m).   Weight as of this encounter: 158.8 kg.  Pressure Injury 11/18/20 Buttocks Left Deep  Tissue Pressure Injury - Purple or maroon localized area of discolored intact skin or blood-filled blister due to damage of underlying soft tissue from pressure and/or shear. (Active)  11/18/20 0400  Location: Buttocks  Location Orientation: Left  Staging: Deep Tissue Pressure Injury - Purple or maroon localized area of discolored intact skin or blood-filled blister due to damage of underlying soft tissue from pressure and/or shear.  Wound Description (Comments):   Present on Admission: Yes     Nutritional status:               Consultants:  None  Procedures:  None  Antimicrobials:  Anti-infectives (From  admission, onward)    None          Subjective: Seen and examined.  She has no new complaint  Objective: Vitals:   11/18/20 1824 11/18/20 2058 11/19/20 0536 11/19/20 0901  BP: (!) 96/54 (!) 103/43 (!) 111/58 103/65  Pulse: 94 (!) 105 94 94  Resp: 16 18 18 19   Temp: 98.5 F (36.9 C) 98.3 F (36.8 C) 97.6 F (36.4 C) 97.8 F (36.6 C)  TempSrc:  Oral Oral Oral  SpO2: 92% 92% 94% 94%  Weight:      Height:        Intake/Output Summary (Last 24 hours) at 11/19/2020 1355 Last data filed at 11/19/2020 1300 Gross per 24 hour  Intake 3266.63 ml  Output 1400 ml  Net 1866.63 ml    Filed Weights   11/17/20 1524  Weight: (!) 158.8 kg    Examination: General exam: Appears calm and comfortable  Respiratory system: Clear to auscultation. Respiratory effort normal. Cardiovascular system: S1 & S2 heard, irregularly irregular rate and rhythm. No JVD, murmurs, rubs, gallops or clicks. No pedal edema. Gastrointestinal system: Abdomen is nondistended, soft and nontender. No organomegaly or masses felt. Normal bowel sounds heard. Central nervous system: Alert and oriented. No focal neurological deficits. Extremities: Symmetric 5 x 5 power. Skin: No rashes, lesions or ulcers.  Psychiatry: Judgement and insight appear normal. Mood & affect appropriate.    Data Reviewed: I have personally reviewed following labs and imaging studies  CBC: Recent Labs  Lab 11/17/20 1526 11/18/20 0548 11/19/20 0239  WBC 13.1* 11.1* 8.8  NEUTROABS 10.9* 8.8* 6.2  HGB 11.9* 11.1* 10.6*  HCT 38.9 36.5 34.2*  MCV 84.6 84.7 84.7  PLT 299 264 227    Basic Metabolic Panel: Recent Labs  Lab 11/17/20 1526 11/18/20 0548 11/19/20 0239  NA 138 140 138  K 3.8 3.7 3.6  CL 104 105 107  CO2 24 23 21*  GLUCOSE 159* 163* 154*  BUN 39* 36* 32*  CREATININE 2.15* 1.96* 1.83*  CALCIUM 9.7 9.2 8.5*    GFR: Estimated Creatinine Clearance: 52.7 mL/min (A) (by C-G formula based on SCr of 1.83 mg/dL  (H)). Liver Function Tests: Recent Labs  Lab 11/18/20 0548  AST 27  ALT 21  ALKPHOS 53  BILITOT 1.2  PROT 6.5  ALBUMIN 3.2*    No results for input(s): LIPASE, AMYLASE in the last 168 hours. No results for input(s): AMMONIA in the last 168 hours. Coagulation Profile: No results for input(s): INR, PROTIME in the last 168 hours. Cardiac Enzymes: Recent Labs  Lab 11/17/20 2332 11/18/20 0548 11/19/20 0239  CKTOTAL 787* 668* 303*    BNP (last 3 results) Recent Labs    03/20/20 1145 06/21/20 1129  PROBNP 125.0* 139.0*    HbA1C: No results for input(s): HGBA1C in the last 72 hours. CBG: Recent  Labs  Lab 11/18/20 1152 11/18/20 1620 11/18/20 2058 11/19/20 0720 11/19/20 1208  GLUCAP 176* 150* 185* 137* 133*    Lipid Profile: No results for input(s): CHOL, HDL, LDLCALC, TRIG, CHOLHDL, LDLDIRECT in the last 72 hours. Thyroid Function Tests: No results for input(s): TSH, T4TOTAL, FREET4, T3FREE, THYROIDAB in the last 72 hours. Anemia Panel: No results for input(s): VITAMINB12, FOLATE, FERRITIN, TIBC, IRON, RETICCTPCT in the last 72 hours. Sepsis Labs: No results for input(s): PROCALCITON, LATICACIDVEN in the last 168 hours.  Recent Results (from the past 240 hour(s))  Resp Panel by RT-PCR (Flu A&B, Covid) Nasopharyngeal Swab     Status: None   Collection Time: 11/18/20  2:32 AM   Specimen: Nasopharyngeal Swab; Nasopharyngeal(NP) swabs in vial transport medium  Result Value Ref Range Status   SARS Coronavirus 2 by RT PCR NEGATIVE NEGATIVE Final    Comment: (NOTE) SARS-CoV-2 target nucleic acids are NOT DETECTED.  The SARS-CoV-2 RNA is generally detectable in upper respiratory specimens during the acute phase of infection. The lowest concentration of SARS-CoV-2 viral copies this assay can detect is 138 copies/mL. A negative result does not preclude SARS-Cov-2 infection and should not be used as the sole basis for treatment or other patient management decisions.  A negative result may occur with  improper specimen collection/handling, submission of specimen other than nasopharyngeal swab, presence of viral mutation(s) within the areas targeted by this assay, and inadequate number of viral copies(<138 copies/mL). A negative result must be combined with clinical observations, patient history, and epidemiological information. The expected result is Negative.  Fact Sheet for Patients:  BloggerCourse.comhttps://www.fda.gov/media/152166/download  Fact Sheet for Healthcare Providers:  SeriousBroker.ithttps://www.fda.gov/media/152162/download  This test is no t yet approved or cleared by the [place] FDA and  has been authorized for detection and/or diagnosis of SARS-CoV-2 by FDA under an Emergency Use Authorization (EUA). This EUA will remain  in effect (meaning this test can be used) for the duration of the COVID-19 declaration under Section 564(b)(1) of the Act, 21 U.S.C.section 360bbb-3(b)(1), unless the authorization is terminated  or revoked sooner.       Influenza A by PCR NEGATIVE NEGATIVE Final   Influenza B by PCR NEGATIVE NEGATIVE Final    Comment: (NOTE) The Xpert Xpress SARS-CoV-2/FLU/RSV plus assay is intended as an aid in the diagnosis of influenza from Nasopharyngeal swab specimens and should not be used as a sole basis for treatment. Nasal washings and aspirates are unacceptable for Xpert Xpress SARS-CoV-2/FLU/RSV testing.  Fact Sheet for Patients: BloggerCourse.comhttps://www.fda.gov/media/152166/download  Fact Sheet for Healthcare Providers: SeriousBroker.ithttps://www.fda.gov/media/152162/download  This test is not yet approved or cleared by the [place] FDA and has been authorized for detection and/or diagnosis of SARS-CoV-2 by FDA under an Emergency Use Authorization (EUA). This EUA will remain in effect (meaning this test can be used) for the duration of the COVID-19 declaration under Section 564(b)(1) of the Act, 21 U.S.C. section 360bbb-3(b)(1), unless the authorization  is terminated or revoked.  Performed at [facility], [address] [place], [place] [address]        Radiology Studies: CT ABDOMEN PELVIS WO CONTRAST  Result Date: 11/18/2020 CLINICAL DATA:  Right lower quadrant abdominal pain EXAM: CT ABDOMEN AND PELVIS WITHOUT CONTRAST TECHNIQUE: Multidetector CT imaging of the abdomen and pelvis was performed following the standard protocol without IV contrast. COMPARISON:  None. FINDINGS: Lower chest: Cardiomegaly.  No acute abnormality. Hepatobiliary: No focal hepatic abnormality. Gallbladder unremarkable. Pancreas: No focal abnormality or ductal dilatation. Spleen: No focal  abnormality.  Normal size. Adrenals/Urinary Tract: Prominent soft tissue area in the midpole of the right kidney measures 2.5 cm and cannot be characterized on this noncontrast study. No hydronephrosis. No renal or ureteral stones. Adrenal glands and urinary bladder unremarkable. Stomach/Bowel: Diffuse colonic diverticulosis. No active diverticulitis. Stomach and small bowel decompressed, unremarkable. Vascular/Lymphatic: Aortic atherosclerosis. No evidence of aneurysm or adenopathy. Reproductive: Uterus and adnexa unremarkable.  No mass. Other: No free fluid or free air. Musculoskeletal: No acute bony abnormality. IMPRESSION: Colonic diverticulosis.  No active diverticulitis. Cardiomegaly. Prominent soft tissue area in the parapelvic midpole of the right kidney, difficult to characterize on this unenhanced study. This could be further evaluated with contrast-enhanced CT or ultrasound. No acute findings in the abdomen or pelvis. Electronically Signed   By: Charlett Nose M.D.   On: 11/18/2020 01:10   DG Lumbar Spine Complete  Result Date: 11/18/2020 CLINICAL DATA:  Left hip pain radiating to the left knee EXAM: LUMBAR SPINE - COMPLETE 4+ VIEW COMPARISON:  None. FINDINGS: Normal alignment. Diffuse degenerative disc and facet disease. No fracture. SI joints symmetric and unremarkable.  IMPRESSION: Diffuse degenerative disc and facet disease. Electronically Signed   By: Charlett Nose M.D.   On: 11/18/2020 00:17   DG Knee Complete 4 Views Left  Result Date: 11/18/2020 CLINICAL DATA:  Pain EXAM: LEFT KNEE - COMPLETE 4+ VIEW COMPARISON:  None. FINDINGS: Advanced tricompartment degenerative changes most pronounced in the medial and patellofemoral compartments. No joint effusion. No acute bony abnormality. Specifically, no fracture, subluxation, or dislocation. IMPRESSION: Advanced tricompartment degenerative changes. No acute bony abnormality. Electronically Signed   By: Charlett Nose M.D.   On: 11/18/2020 00:16   DG Hip Unilat W or Wo Pelvis 2-3 Views Left  Result Date: 11/18/2020 CLINICAL DATA:  Left hip pain radiating to left knee. EXAM: DG HIP (WITH OR WITHOUT PELVIS) 2-3V LEFT COMPARISON:  None. FINDINGS: Degenerative changes in the hips bilaterally. No acute bony abnormality. Specifically, no fracture, subluxation, or dislocation. IMPRESSION: No acute bony abnormality. Electronically Signed   By: Charlett Nose M.D.   On: 11/18/2020 00:16    Scheduled Meds:  diltiazem  360 mg Oral Daily   insulin aspart  0-9 Units Subcutaneous TID WC   metoprolol succinate  100 mg Oral Daily   omega-3 acid ethyl esters  2 g Oral BID   pregabalin  50 mg Oral TID   rivaroxaban  20 mg Oral Q supper   Continuous Infusions:  sodium chloride 100 mL/hr at 11/18/20 1320   sodium chloride 100 mL/hr at 11/19/20 0533     LOS: 1 day   Time spent: 30 minutes   Hughie Closs, MD [facility]  11/19/2020, 1:55 PM   How to contact the [facility] Attending or Consulting provider 7A - 7P or covering provider during after hours 7P -7A, for this patient?  Check the care team in [facility] and look for a) attending/consulting [facility] provider listed and b) the [facility] team listed. Page or secure chat 7A-7P. Log into [email or web address] and use [facility]'s universal password to access. If you do not have the password, please  contact the hospital operator. Locate the [facility] provider you are looking for under [facility] and page to a number that you can be directly reached. If you still have difficulty reaching the provider, please page the [facility] (Director on Call) for the Hospitalists listed on amion for assistance.

## 2020-11-19 NOTE — Plan of Care (Signed)
  Problem: Clinical Measurements: Goal: Diagnostic test results will improve Outcome: Progressing   Problem: Activity: Goal: Risk for activity intolerance will decrease Outcome: Progressing   Problem: Safety: Goal: Ability to remain free from injury will improve Outcome: Progressing   

## 2020-11-20 LAB — BASIC METABOLIC PANEL
Anion gap: 7 (ref 5–15)
BUN: 23 mg/dL (ref 8–23)
CO2: 25 mmol/L (ref 22–32)
Calcium: 8.5 mg/dL — ABNORMAL LOW (ref 8.9–10.3)
Chloride: 104 mmol/L (ref 98–111)
Creatinine, Ser: 1.58 mg/dL — ABNORMAL HIGH (ref 0.44–1.00)
GFR, Estimated: 36 mL/min — ABNORMAL LOW (ref >60)
Glucose, Bld: 142 mg/dL — ABNORMAL HIGH (ref 70–99)
Potassium: 3.6 mmol/L (ref 3.5–5.1)
Sodium: 136 mmol/L (ref 135–145)

## 2020-11-20 LAB — HEMOGLOBIN A1C
Hgb A1c MFr Bld: 6.9 % — ABNORMAL HIGH (ref 4.8–5.6)
Mean Plasma Glucose: 151 mg/dL

## 2020-11-20 LAB — GLUCOSE, CAPILLARY
Glucose-Capillary: 146 mg/dL — ABNORMAL HIGH (ref 70–99)
Glucose-Capillary: 140 mg/dL — ABNORMAL HIGH (ref 70–99)
Glucose-Capillary: 176 mg/dL — ABNORMAL HIGH (ref 70–99)
Glucose-Capillary: 155 mg/dL — ABNORMAL HIGH (ref 70–99)

## 2020-11-20 NOTE — Plan of Care (Signed)
  Problem: Activity: Goal: Risk for activity intolerance will decrease Outcome: Progressing   Problem: Safety: Goal: Ability to remain free from injury will improve Outcome: Progressing   Problem: Skin Integrity: Goal: Risk for impaired skin integrity will decrease Outcome: Progressing   Problem: Activity: Goal: Risk for activity intolerance will decrease Outcome: Progressing   Problem: Nutrition: Goal: Adequate nutrition will be maintained Outcome: Progressing

## 2020-11-20 NOTE — TOC Initial Note (Addendum)
Transition of Care [facility]) - Initial/Assessment Note    Patient Details  Name: Pamela [name] MRN: [id number] Date of Birth: 09-27-1955  [facility] CM/SW Contact:    Lawerance Sabal, RN Phone Number: 11/20/2020, 11:10 AM  Clinical Narrative:         Sherron Monday w patient at bedside. She states that she is interested in going to SNF prior to going home to work on strength and mobility. She states her preference would be [facility] and is agreeable to a search for beds in [facility].      Patient has had both Pfizer vaccines, last one in October 2021      Expected Discharge Plan: Skilled Nursing Facility Barriers to Discharge: Continued Medical Work up   Patient Goals and CMS Choice Patient states their goals for this hospitalization and ongoing recovery are:: to get rehab prior to going home      Expected Discharge Plan and Services Expected Discharge Plan: Skilled Nursing Facility In-house Referral: Clinical Social Work Discharge Planning Services: CM Consult Post Acute Care Choice: Skilled Nursing Facility Living arrangements for the past 2 months: Apartment                                      Prior Living Arrangements/Services Living arrangements for the past 2 months: Apartment Lives with:: Self Patient language and need for interpreter reviewed:: Yes Do you feel safe going back to the place where you live?: Yes            Criminal Activity/Legal Involvement Pertinent to Current Situation/Hospitalization: No - Comment as needed  Activities of Daily Living      Permission Sought/Granted                  Emotional Assessment              Admission diagnosis:  Dehydration [E86.0] ARF (acute renal failure) (HCC) [N17.9] Elevated CK [R74.8] Acute pain of left knee [M25.562] AKI (acute kidney injury) (HCC) [N17.9] Patient Active Problem List   Diagnosis Date Noted   ARF (acute renal failure) (HCC) 11/18/2020   Elevated CK  11/18/2020   Acute pain of left knee 11/18/2020   AKI (acute kidney injury) (HCC) 11/18/2020   Chronic diastolic heart failure (HCC) 06/24/2020   Debility 06/21/2020   Vertigo 03/20/2020   SOB (shortness of breath) 03/20/2020   Left shoulder pain 02/27/2019   Acute upper respiratory infection 09/07/2018   Low back pain 12/01/2017   Diabetic foot ulcer (HCC) 03/03/2017   Charcot foot due to diabetes mellitus (HCC) 02/28/2017   Diabetic ulcer of toe of left foot associated with type 2 diabetes mellitus, limited to breakdown of skin (HCC) 02/28/2017   Obesity, Class III, BMI 40-49.9 (morbid obesity) (HCC) 02/28/2017   Chronic atrial fibrillation (HCC) 12/08/2015   Navicular fracture of ankle with malunion 05/30/2015   Neuropathic pain of foot 05/30/2015   Navicular fracture, foot 05/02/2015   Right ankle pain 04/19/2015   Cough 11/17/2014   Fatigue 09/25/2014   Ganglion cyst 02/06/2014   Bilateral hand pain 11/29/2012   Anemia, unspecified 04/23/2012   CKD (chronic kidney disease) stage 3, GFR 30-59 ml/min (HCC) 04/23/2012   Hyperlipidemia 04/23/2012   Peripheral neuropathy 01/16/2012   DJD (degenerative joint disease) of knee 04/15/2011   Migraine 04/15/2011   Acute on chronic diastolic heart failure (HCC) 10/28/2010   Rosacea 10/11/2010  Anticoagulated 10/11/2010   Encounter for preventative adult health care exam with abnormal findings 10/06/2010   Diabetes (HCC) 12/31/2007   Anxiety state 12/31/2007   Depression 12/31/2007   Essential hypertension 12/31/2007   ALLERGIC RHINITIS 12/31/2007   PCP:  Corwin Levins, MD Pharmacy:   [facility] [phone] [place], [place] - [address] AT [facility] [address] & [facility] [address] [place] [address] Phone: [phone] Fax: [phone]     Social Determinants of Health (SDOH) Interventions    Readmission Risk Interventions No flowsheet data found.

## 2020-11-20 NOTE — Progress Notes (Signed)
PROGRESS NOTE    Pamela [name]  UDJ:[id number] DOB: 1955/08/29 DOA: 11/17/2020 PCP: Corwin Levins, MD   Brief Narrative:  Pamela [name] is a 65 y.o. female with history of diabetes mellitus type 2, hypertension, A. fib, diastolic CHF, chronic kidney disease was brought in due to difficulty to walk and get up from the commode 2 days ago.  Patient had to sit on the commode for many hours following which patient had to roll onto the floor to reach the phone and called her brother.  Patient was having significant pain of the left knee.  Patient was brought to the ER.  Patient has not had any food for almost 48 hours.  Also has not taken her medications.   In the ER patient had CT abdomen pelvis and x-rays done which did not show anything acute.  Initially patient's heart rate was elevated but improved with fluids.  CK levels were elevated at 700.  Creatinine has worsened from 1.7-2.1.  Patient admitted for acute renal failure with mild rhabdomyolysis and also pain in the left knee which is chronic but she has been avoiding to see orthopedic since she knows that she will likely require knee replacement surgery which she is afraid to have.  Seen by PT OT here and they recommend SNF.  She lives by herself.  TOC aware.  Assessment & Plan:   Principal Problem:   ARF (acute renal failure) (HCC) Active Problems:   Diabetes (HCC)   Elevated CK   Acute pain of left knee   AKI (acute kidney injury) (HCC)  AKI on CKD stage IIIb: Her baseline creatinine seems to be around 1.5 with GFR 38.  Creatinine at presentation was 2.15.  Improved and back to baseline now so I will stop IV fluids.  Mild rhabdomyolysis: Resolved.  Generalized debility secondary to left knee pain: Per patient, the pain is ongoing for several years and she is already aware that she has osteoarthritis.  Despite of this, she has never sought any medical care from any orthopedics because she is afraid to have knee replacement surgery in  case that is recommended.  She was highly advised to follow-up with orthopedics as outpatient as this is not an emergency and knee replacement might be needed but can be done as elective procedure.  She was seen by PT OT and they recommend SNF.  TOC aware.  Paroxysmal atrial fibrillation: Rates controlled.  Continue home dose of Cardizem, metoprolol and Xarelto.  Chronic diastolic congestive heart failure: Looks slightly dehydrated.  Needs some more gentle hydration for today.  Holding diuretics.  Essential hypertension: Continue Cardizem and metoprolol but hold losartan due to AKI.  Type 2 diabetes mellitus: Takes glipizide at home.  Recent hemoglobin A1c in January of this year was 9.9.  Repeat hemoglobin A1c pending.  Blood sugar controlled.  Continue SSI.  DVT prophylaxis: rivaroxaban (XARELTO) tablet 20 mg Start: 11/18/20 1700   Code Status: Full Code  Family Communication:  None present at bedside.  Plan of care discussed with patient in length and he verbalized understanding and agreed with it.  Status is: Inpatient  Remains inpatient appropriate because:Unsafe d/c plan  Dispo: The patient is from: Home              Anticipated d/c is to: SNF              Patient currently is medically stable to d/c.   Difficult to place patient No  Estimated body mass index is 47.47 kg/m as calculated from the following:   Height as of this encounter: 6' (1.829 m).   Weight as of this encounter: 158.8 kg.  Pressure Injury 11/18/20 Buttocks Left Deep Tissue Pressure Injury - Purple or maroon localized area of discolored intact skin or blood-filled blister due to damage of underlying soft tissue from pressure and/or shear. (Active)  11/18/20 0400  Location: Buttocks  Location Orientation: Left  Staging: Deep Tissue Pressure Injury - Purple or maroon localized area of discolored intact skin or blood-filled blister due to damage of underlying soft tissue from pressure and/or shear.   Wound Description (Comments):   Present on Admission: Yes     Nutritional status:               Consultants:  None  Procedures:  None  Antimicrobials:  Anti-infectives (From admission, onward)    None          Subjective: Seen and examined.  No complaints.  Objective: Vitals:   11/19/20 1700 11/19/20 1935 11/20/20 0405 11/20/20 0930  BP: 110/60 104/76 106/61 110/70  Pulse: 90 70 67 91  Resp: 18 16 16 19   Temp: 97.7 F (36.5 C) 98.2 F (36.8 C) 97.8 F (36.6 C) 98 F (36.7 C)  TempSrc: Oral Oral Oral Oral  SpO2: 95% 92% 93% 90%  Weight:      Height:        Intake/Output Summary (Last 24 hours) at 11/20/2020 1104 Last data filed at 11/20/2020 0700 Gross per 24 hour  Intake 3620.78 ml  Output 1100 ml  Net 2520.78 ml    Filed Weights   11/17/20 1524  Weight: (!) 158.8 kg    Examination: General exam: Appears calm and comfortable, obese Respiratory system: Clear to auscultation. Respiratory effort normal. Cardiovascular system: S1 & S2 heard, RRR. No JVD, murmurs, rubs, gallops or clicks. No pedal edema. Gastrointestinal system: Abdomen is nondistended, soft and nontender. No organomegaly or masses felt. Normal bowel sounds heard. Central nervous system: Alert and oriented. No focal neurological deficits. Extremities: Symmetric 5 x 5 power. Skin: No rashes, lesions or ulcers.  Psychiatry: Judgement and insight appear normal. Mood & affect appropriate.    Data Reviewed: I have personally reviewed following labs and imaging studies  CBC: Recent Labs  Lab 11/17/20 1526 11/18/20 0548 11/19/20 0239  WBC 13.1* 11.1* 8.8  NEUTROABS 10.9* 8.8* 6.2  HGB 11.9* 11.1* 10.6*  HCT 38.9 36.5 34.2*  MCV 84.6 84.7 84.7  PLT 299 264 227    Basic Metabolic Panel: Recent Labs  Lab 11/17/20 1526 11/18/20 0548 11/19/20 0239 11/20/20 0251  NA 138 140 138 136  K 3.8 3.7 3.6 3.6  CL 104 105 107 104  CO2 24 23 21* 25  GLUCOSE 159* 163* 154* 142*   BUN 39* 36* 32* 23  CREATININE 2.15* 1.96* 1.83* 1.58*  CALCIUM 9.7 9.2 8.5* 8.5*    GFR: Estimated Creatinine Clearance: 61 mL/min (A) (by C-G formula based on SCr of 1.58 mg/dL (H)). Liver Function Tests: Recent Labs  Lab 11/18/20 0548  AST 27  ALT 21  ALKPHOS 53  BILITOT 1.2  PROT 6.5  ALBUMIN 3.2*    No results for input(s): LIPASE, AMYLASE in the last 168 hours. No results for input(s): AMMONIA in the last 168 hours. Coagulation Profile: No results for input(s): INR, PROTIME in the last 168 hours. Cardiac Enzymes: Recent Labs  Lab 11/17/20 2332 11/18/20 0548 11/19/20 0239  CKTOTAL 787* 668*  303*    BNP (last 3 results) Recent Labs    03/20/20 1145 06/21/20 1129  PROBNP 125.0* 139.0*    HbA1C: Recent Labs    11/18/20 0548  HGBA1C 6.9*   CBG: Recent Labs  Lab 11/19/20 0720 11/19/20 1208 11/19/20 1705 11/19/20 2018 11/20/20 0639  GLUCAP 137* 133* 123* 132* 146*    Lipid Profile: No results for input(s): CHOL, HDL, LDLCALC, TRIG, CHOLHDL, LDLDIRECT in the last 72 hours. Thyroid Function Tests: No results for input(s): TSH, T4TOTAL, FREET4, T3FREE, THYROIDAB in the last 72 hours. Anemia Panel: No results for input(s): VITAMINB12, FOLATE, FERRITIN, TIBC, IRON, RETICCTPCT in the last 72 hours. Sepsis Labs: No results for input(s): PROCALCITON, LATICACIDVEN in the last 168 hours.  Recent Results (from the past 240 hour(s))  Resp Panel by RT-PCR (Flu A&B, Covid) Nasopharyngeal Swab     Status: None   Collection Time: 11/18/20  2:32 AM   Specimen: Nasopharyngeal Swab; Nasopharyngeal(NP) swabs in vial transport medium  Result Value Ref Range Status   SARS Coronavirus 2 by RT PCR NEGATIVE NEGATIVE Final    Comment: (NOTE) SARS-CoV-2 target nucleic acids are NOT DETECTED.  The SARS-CoV-2 RNA is generally detectable in upper respiratory specimens during the acute phase of infection. The lowest concentration of SARS-CoV-2 viral copies this assay  can detect is 138 copies/mL. A negative result does not preclude SARS-Cov-2 infection and should not be used as the sole basis for treatment or other patient management decisions. A negative result may occur with  improper specimen collection/handling, submission of specimen other than nasopharyngeal swab, presence of viral mutation(s) within the areas targeted by this assay, and inadequate number of viral copies(<138 copies/mL). A negative result must be combined with clinical observations, patient history, and epidemiological information. The expected result is Negative.  Fact Sheet for Patients:  BloggerCourse.com  Fact Sheet for Healthcare Providers:  SeriousBroker.it  This test is no t yet approved or cleared by the [place] FDA and  has been authorized for detection and/or diagnosis of SARS-CoV-2 by FDA under an Emergency Use Authorization (EUA). This EUA will remain  in effect (meaning this test can be used) for the duration of the COVID-19 declaration under Section 564(b)(1) of the Act, 21 U.S.C.section 360bbb-3(b)(1), unless the authorization is terminated  or revoked sooner.       Influenza A by PCR NEGATIVE NEGATIVE Final   Influenza B by PCR NEGATIVE NEGATIVE Final    Comment: (NOTE) The Xpert Xpress SARS-CoV-2/FLU/RSV plus assay is intended as an aid in the diagnosis of influenza from Nasopharyngeal swab specimens and should not be used as a sole basis for treatment. Nasal washings and aspirates are unacceptable for Xpert Xpress SARS-CoV-2/FLU/RSV testing.  Fact Sheet for Patients: BloggerCourse.com  Fact Sheet for Healthcare Providers: SeriousBroker.it  This test is not yet approved or cleared by the [place] FDA and has been authorized for detection and/or diagnosis of SARS-CoV-2 by FDA under an Emergency Use Authorization (EUA). This EUA will  remain in effect (meaning this test can be used) for the duration of the COVID-19 declaration under Section 564(b)(1) of the Act, 21 U.S.C. section 360bbb-3(b)(1), unless the authorization is terminated or revoked.  Performed at [facility], [address] [place], [place] [address]        Radiology Studies: No results found.  Scheduled Meds:  diltiazem  360 mg Oral Daily   insulin aspart  0-9 Units Subcutaneous TID WC   metoprolol succinate  100 mg Oral  Daily   omega-3 acid ethyl esters  2 g Oral BID   pregabalin  50 mg Oral TID   rivaroxaban  20 mg Oral Q supper   Continuous Infusions:     LOS: 2 days   Time spent: 26 minutes   Hughie Clossavi Koltyn Kelsay, MD [facility]  11/20/2020, 11:04 AM   How to contact the [facility] Attending or Consulting provider 7A - 7P or covering provider during after hours 7P -7A, for this patient?  Check the care team in [facility] and look for a) attending/consulting TRH provider listed and b) the [facility] team listed. Page or secure chat 7A-7P. Log into [email or web address] and use [place]'s universal password to access. If you do not have the password, please contact the hospital operator. Locate the [facility] provider you are looking for under [facility] and page to a number that you can be directly reached. If you still have difficulty reaching the provider, please page the [facility] (Director on Call) for the Hospitalists listed on amion for assistance.

## 2020-11-20 NOTE — Evaluation (Signed)
Occupational Therapy Evaluation Patient Details Name: Pamela [name] MRN: [id number] DOB: Mar 26, 1956 Today's Date: 11/20/2020    History of Present Illness Pt is a 65 y/o female admitted secondary to difficulty walking and unable to get up from the commode at home.  Patient had to sit on the commode for many hours following which patient had to roll onto the floor to reach the phone and called her brother.  Patient was having significant pain of the left knee.  She has not had any food for almost 48 hours and has not taken her medications. PMH including but not limited to DM, HTN, A. fib, diastolic CHF, CKD.   Clinical Impression   Pt presents with decline in function and safety with ADLs and ADL mobility with impaired strength, balance and endurance; pt limited by pain in B knees. PTA pt lived at home alone and was Ind with ADLs/selfcare, IADLs, home mgt, mobility, was driving and working from home. Pt currently unable to stand without +2 or mechanical assist, min guard A with UB ADLs, max - total A with LB ADLs and total A with toileting . Pt very pleasant, cooperative and motivated to return to being Ind. Pt would benefit from acute OT services to address impairments to maximize level of function and safety    Follow Up Recommendations  SNF    Equipment Recommendations  Other (comment) (TBD at next venue of care)    Recommendations for Other Services       Precautions / Restrictions Precautions Precautions: Fall Restrictions Weight Bearing Restrictions: No      Mobility Bed Mobility Overal bed mobility: Needs Assistance Bed Mobility: Rolling Rolling: Supervision   Supine to sit: Supervision Sit to supine: Supervision   General bed mobility comments: used rail    Transfers                 General transfer comment: will need +2 or mechanical device    Balance Overall balance assessment: Needs assistance   Sitting balance-[name]: Fair                                      ADL either performed or assessed with clinical judgement   ADL Overall ADL's : Needs assistance/impaired Eating/Feeding: Set up;Independent;Sitting   Grooming: Wash/dry hands;Wash/dry face;Set up;Supervision/safety   Upper Body Bathing: Min guard   Lower Body Bathing: Maximal assistance   Upper Body Dressing : Min guard   Lower Body Dressing: Total assistance       Toileting- Clothing Manipulation and Hygiene: Total assistance;Bed level               Vision Baseline Vision/History: Wears glasses Wears Glasses: At all times Patient Visual Report: No change from baseline       Perception     Praxis      Pertinent Vitals/Pain Pain Assessment: Faces Faces Pain Scale: Hurts little more Pain Location: L hip and knee Pain Descriptors / Indicators: Grimacing;Guarding Pain Intervention(s): Monitored during session;Repositioned     Hand Dominance Right   Extremity/Trunk Assessment Upper Extremity Assessment Upper Extremity Assessment: Generalized weakness   Lower Extremity Assessment Lower Extremity Assessment: Defer to PT evaluation       Communication Communication Communication: No difficulties   Cognition Arousal/Alertness: Awake/alert Behavior During Therapy: WFL for tasks assessed/performed Overall Cognitive Status: Within Functional Limits for tasks assessed  General Comments       Exercises     Shoulder Instructions      Home Living Family/patient expects to be discharged to:: Private residence Living Arrangements: Alone Available Help at Discharge: Family;Available PRN/intermittently Type of Home: Apartment Home Access: Level entry     Home Layout: One level     Bathroom Shower/Tub: Chief Strategy Officer: Standard     Home Equipment: Grab bars - tub/shower          Prior Functioning/Environment Level of Independence: Independent         Comments: Ind with ADLs/selfcare, home mgt, drives, works from home        OT Problem List: Decreased strength;Impaired balance (sitting and/or standing);Pain;Decreased activity tolerance;Decreased knowledge of use of DME or AE      OT Treatment/Interventions: Self-care/ADL training;DME and/or AE instruction;Therapeutic activities;Therapeutic exercise;Patient/family education    OT Goals(Current goals can be found in the care plan section) Acute Rehab OT Goals Patient Stated Goal: go to rehab and go home OT Goal Formulation: With patient Time For Goal Achievement: 12/04/20 ADL Goals Pt Will Perform Upper Body Bathing: with set-up;with supervision;sitting Pt Will Perform Lower Body Bathing: with mod assist;sitting/lateral leans Pt Will Perform Upper Body Dressing: with supervision;with set-up;sitting Pt Will Transfer to Toilet: with max assist;with mod assist;stand pivot transfer;bedside commode Pt Will Perform Toileting - Clothing Manipulation and hygiene: with max assist;with mod assist;sitting/lateral leans  OT Frequency: Min 2X/week   Barriers to D/C:            Co-evaluation              AM-PAC OT "6 Clicks" Daily Activity     Outcome Measure Help from another person eating meals?: None Help from another person taking care of personal grooming?: A Little Help from another person toileting, which includes using toliet, bedpan, or urinal?: Total Help from another person bathing (including washing, rinsing, drying)?: A Lot Help from another person to put on and taking off regular upper body clothing?: A Little Help from another person to put on and taking off regular lower body clothing?: Total 6 Click Score: 14   End of Session    Activity Tolerance: No increased pain Patient left: in bed;with call bell/phone within reach  OT Visit Diagnosis: Other abnormalities of gait and mobility (R26.89);History of falling (Z91.81);Muscle weakness (generalized)  (M62.81);Pain Pain - Right/Left:  (Bilaterally) Pain - part of body: Knee                Time: 6789-3810 OT Time Calculation (min): 25 min Charges:  OT General Charges $OT Visit: 1 Visit OT Evaluation $OT Eval Moderate Complexity: 1 Mod OT Treatments $Self Care/Home Management : 8-22 mins    Galen Manila 11/20/2020, 12:59 PM

## 2020-11-20 NOTE — TOC Progression Note (Addendum)
Transition of Care [facility]) - Progression Note    Patient Details  Name: Pamela [name] MRN: [id number] Date of Birth: [birth year]  [facility] CM/SW Contact  Sharlet Salina Mila Homer, LCSW Phone Number: 11/20/2020, 4:16 PM  Clinical Narrative:  Met with patient at bedside (3:30 pm) and gave her the 2 bed offers: [facility] and [facility]. Patient chose [facility] and facility admissions director contacted. She will follow-up with CSW.  5:11 pm: CSW received call from [place] and they can take patient and will initiate authorization.     Expected Discharge Plan: [place] Barriers to Discharge: Continued Medical Work up  Expected Discharge Plan and Services Expected Discharge Plan: [place] In-house Referral: Clinical Social Work Discharge Planning Services: CM Consult Post Acute Care Choice: [place] Living arrangements for the past 2 months: Apartment                                      Social Determinants of Health (SDOH) Interventions  No SDOH interventions requested or needed at this time.  Readmission Risk Interventions No flowsheet data found.

## 2020-11-20 NOTE — NC FL2 (Addendum)
[place] MEDICAID FL2 LEVEL OF CARE SCREENING TOOL     IDENTIFICATION  Patient Name: Pamela [name] Birthdate: 09/27/1955 Sex: female Admission Date (Current Location): 11/17/2020  [facility] and [place] Number:  Producer, television/film/video and Address:  [facility] [place], [address] [place], [place] [address]      Provider Number: [id number]  Attending Physician Name and Address:  Hughie Closs, MD  Relative Name and Phone Number:  Mcmillion,Timothy [phone]    Current Level of Care: Hospital Recommended Level of Care: Skilled Nursing Facility Prior Approval Number:    Date Approved/Denied:   PASRR Number: [id number]  Discharge Plan: SNF    Current Diagnoses: Patient Active Problem List   Diagnosis Date Noted   ARF (acute renal failure) (HCC) 11/18/2020   Elevated CK 11/18/2020   Acute pain of left knee 11/18/2020   AKI (acute kidney injury) (HCC) 11/18/2020   Chronic diastolic heart failure (HCC) 06/24/2020   Debility 06/21/2020   Vertigo 03/20/2020   SOB (shortness of breath) 03/20/2020   Left shoulder pain 02/27/2019   Acute upper respiratory infection 09/07/2018   Low back pain 12/01/2017   Diabetic foot ulcer (HCC) 03/03/2017   Charcot foot due to diabetes mellitus (HCC) 02/28/2017   Diabetic ulcer of toe of left foot associated with type 2 diabetes mellitus, limited to breakdown of skin (HCC) 02/28/2017   Obesity, Class III, BMI 40-49.9 (morbid obesity) (HCC) 02/28/2017   Chronic atrial fibrillation (HCC) 12/08/2015   Navicular fracture of ankle with malunion 05/30/2015   Neuropathic pain of foot 05/30/2015   Navicular fracture, foot 05/02/2015   Right ankle pain 04/19/2015   Cough 11/17/2014   Fatigue 09/25/2014   Ganglion cyst 02/06/2014   Bilateral hand pain 11/29/2012   Anemia, unspecified 04/23/2012   CKD (chronic kidney disease) stage 3, GFR 30-59 ml/min (HCC) 04/23/2012   Hyperlipidemia 04/23/2012   Peripheral neuropathy  01/16/2012   DJD (degenerative joint disease) of knee 04/15/2011   Migraine 04/15/2011   Acute on chronic diastolic heart failure (HCC) 10/28/2010   Rosacea 10/11/2010   Anticoagulated 10/11/2010   Encounter for preventative adult health care exam with abnormal findings 10/06/2010   Diabetes (HCC) 12/31/2007   Anxiety state 12/31/2007   Depression 12/31/2007   Essential hypertension 12/31/2007   ALLERGIC RHINITIS 12/31/2007    Orientation RESPIRATION BLADDER Height & Weight     Self, Time, Situation, Place  Normal Continent Weight: (!) 158.8 kg Height:  6' (182.9 cm)  BEHAVIORAL SYMPTOMS/MOOD NEUROLOGICAL BOWEL NUTRITION STATUS      Continent Diet (Carb Modified Fluid Restriction 1200 ML)  AMBULATORY STATUS COMMUNICATION OF NEEDS Skin   Extensive Assist Verbally Skin abrasions (Deep Tissue Pressure Injury Left Buttocks, Skin purple/ maroon, Abrasion to Right and Left elbows,)                       Personal Care Assistance Level of Assistance  Bathing, Feeding, Dressing Bathing Assistance: Maximum assistance Feeding assistance: Independent Dressing Assistance: Limited assistance     Functional Limitations Info  Sight, Hearing, Speech Sight Info: Impaired Hearing Info: Adequate Speech Info: Adequate    SPECIAL CARE FACTORS FREQUENCY  PT (By licensed PT), OT (By licensed OT)     PT Frequency: 5x week OT Frequency: 5x week            Contractures Contractures Info: Not present    Additional Factors Info  Code Status, Allergies   Insulin Code Status Info:  Full Code Allergies Info: Cymbalta and Gabapentin    0-9 Units 3 times per day with meals       Current Medications (11/20/2020):  This is the current hospital active medication list Current Facility-Administered Medications  Medication Dose Route Frequency Provider Last Rate Last Admin   acetaminophen (TYLENOL) tablet 650 mg  650 mg Oral Q6H PRN Eduard Clos, MD   650 mg at 11/20/20 0946    Or   acetaminophen (TYLENOL) suppository 650 mg  650 mg Rectal Q6H PRN Eduard Clos, MD       diltiazem (CARDIZEM CD) 24 hr capsule 360 mg  360 mg Oral Daily Eduard Clos, MD   360 mg at 11/20/20 0934   insulin aspart (novoLOG) injection 0-9 Units  0-9 Units Subcutaneous TID WC Eduard Clos, MD   1 Units at 11/20/20 0918   metoprolol succinate (TOPROL-XL) 24 hr tablet 100 mg  100 mg Oral Daily Eduard Clos, MD   100 mg at 11/20/20 0934   metoprolol tartrate (LOPRESSOR) injection 5 mg  5 mg Intravenous Q6H PRN Hughie Closs, MD       omega-3 acid ethyl esters (LOVAZA) capsule 2 g  2 g Oral BID Eduard Clos, MD   2 g at 11/20/20 0934   pregabalin (LYRICA) capsule 50 mg  50 mg Oral TID Eduard Clos, MD   50 mg at 11/20/20 0934   rivaroxaban (XARELTO) tablet 20 mg  20 mg Oral Q supper Eduard Clos, MD   20 mg at 11/19/20 1757     Discharge Medications: Please see discharge summary for a list of discharge medications.  Relevant Imaging Results:  Relevant Lab Results:   Additional Information [id number].  COVID vaccinations: 01/24/20, 04/02/20  Lawerance Sabal, RN

## 2020-11-21 DIAGNOSIS — I4821 Permanent atrial fibrillation: Secondary | ICD-10-CM

## 2020-11-21 DIAGNOSIS — M6282 Rhabdomyolysis: Secondary | ICD-10-CM

## 2020-11-21 DIAGNOSIS — I5032 Chronic diastolic (congestive) heart failure: Secondary | ICD-10-CM

## 2020-11-21 DIAGNOSIS — R7989 Other specified abnormal findings of blood chemistry: Secondary | ICD-10-CM

## 2020-11-21 DIAGNOSIS — M1991 Primary osteoarthritis, unspecified site: Secondary | ICD-10-CM

## 2020-11-21 DIAGNOSIS — R5381 Other malaise: Secondary | ICD-10-CM

## 2020-11-21 DIAGNOSIS — E1165 Type 2 diabetes mellitus with hyperglycemia: Secondary | ICD-10-CM

## 2020-11-21 LAB — GLUCOSE, CAPILLARY
Glucose-Capillary: 160 mg/dL — ABNORMAL HIGH (ref 70–99)
Glucose-Capillary: 110 mg/dL — ABNORMAL HIGH (ref 70–99)
Glucose-Capillary: 202 mg/dL — ABNORMAL HIGH (ref 70–99)
Glucose-Capillary: 189 mg/dL — ABNORMAL HIGH (ref 70–99)

## 2020-11-21 MED ORDER — POLYETHYLENE GLYCOL 3350 17 G PO PACK: 17.0000 g | PACK | Freq: Two times a day (BID) | ORAL | Status: DC | PRN

## 2020-11-21 MED ORDER — SENNOSIDES-DOCUSATE SODIUM 8.6-50 MG PO TABS
1.0000 | ORAL_TABLET | Freq: Two times a day (BID) | ORAL | Status: DC | PRN
  Administered 2020-11-21 – 2020-11-23 (×2): 1 via ORAL
  Filled 2020-11-21 (×2): qty 1

## 2020-11-21 MED ORDER — FUROSEMIDE 40 MG PO TABS
40.0000 mg | ORAL_TABLET | Freq: Every day | ORAL | Status: DC
  Administered 2020-11-21 – 2020-11-24 (×4): 40 mg via ORAL
  Filled 2020-11-21 (×4): qty 1

## 2020-11-21 NOTE — Progress Notes (Signed)
Physical Therapy Treatment Patient Details Name: Pamela [name] MRN: [id number] DOB: [birth year] Today's Date: 11/21/2020    History of Present Illness Pt is a 65 y/o female admitted secondary to difficulty walking and unable to get up from the commode at home.  Patient had to sit on the commode for many hours following which patient had to roll onto the floor to reach the phone and called her brother.  Patient was having significant pain of the left knee.  She has not had any food for almost 48 hours and has not taken her medications. PMH including but not limited to DM, HTN, A. fib, diastolic CHF, CKD.    PT Comments    Pt making very slow progress and is still limited by lt hip pain. Unable to fully stand despite multiple attempts with 2 person assist. On best attempts able to raise buttocks ~8". Hopefully lt hip pain will improve and pt will improve mobility.    Follow Up Recommendations  SNF     Equipment Recommendations  None recommended by PT    Recommendations for Other Services       Precautions / Restrictions Precautions Precautions: Fall    Mobility  Bed Mobility Overal bed mobility: Needs Assistance Bed Mobility: Supine to Sit     Supine to sit: Supervision     General bed mobility comments: assist for safety and to deflate bed once sitting EOB    Transfers                 General transfer comment: Attempted x 8 to stand from bed with walker. Unable to stand with +2 mod assist due to lt hip pain. On best attempt able to rise buttocks ~8". Used rocking momentum.Tried scooting on EOB with pt able to scoot ~1 foot with supervision but limited by hip pain  Ambulation/Gait                 Stairs             Wheelchair Mobility    Modified Rankin (Stroke Patients Only)       Balance Overall balance assessment: Needs assistance Sitting-balance support: Feet supported Sitting balance-[name]: Fair                                       Cognition Arousal/Alertness: Awake/alert Behavior During Therapy: WFL for tasks assessed/performed Overall Cognitive Status: Within Functional Limits for tasks assessed                                        Exercises      General Comments        Pertinent Vitals/Pain Pain Assessment: Faces Faces Pain Scale: Hurts even more Pain Location: lt hip with mobility Pain Descriptors / Indicators: Grimacing;Guarding Pain Intervention(s): Limited activity within patient's tolerance;Monitored during session    Home Living                      Prior Function            PT Goals (current goals can now be found in the care plan section) Acute Rehab PT Goals Patient Stated Goal: decrease pain Progress towards PT goals: Progressing toward goals    Frequency    Min 2X/week  PT Plan Current plan remains appropriate    Co-evaluation              AM-PAC PT "6 Clicks" Mobility   Outcome Measure  Help needed turning from your back to your side while in a flat bed without using bedrails?: None Help needed moving from lying on your back to sitting on the side of a flat bed without using bedrails?: None Help needed moving to and from a bed to a chair (including a wheelchair)?: Total Help needed standing up from a chair using your arms (e.g., wheelchair or bedside chair)?: Total Help needed to walk in hospital room?: Total Help needed climbing 3-5 steps with a railing? : Total 6 Click Score: 12    End of Session   Activity Tolerance: Patient limited by pain Patient left: in bed;with call bell/phone within reach (sitting EOB) Nurse Communication: Mobility status PT Visit Diagnosis: Other abnormalities of gait and mobility (R26.89);Pain Pain - Right/Left: Left Pain - part of body: Hip     Time: 1610-9604 PT Time Calculation (min) (ACUTE ONLY): 14 min  Charges:  $Therapeutic Activity: 8-22 mins                      [facility] PT Acute Rehabilitation Services Pager [phone] Office [phone]    Angelina Ok [facility] 11/21/2020, 10:23 AM

## 2020-11-21 NOTE — Progress Notes (Signed)
PROGRESS NOTE  Pamela [name] UTM:[id number] DOB: [birth year]   PCP: Corwin Levins, MD  Patient is from: Home.  Lives alone.  DOA: 11/17/2020 LOS: 3  Chief complaints:  Chief Complaint  Patient presents with   Knee Pain   Brief Narrative / Interim history: 65 year old F with PMH of DM-2, HTN, A. fib on Xarelto, diastolic CHF, CKD-3B and morbid obesity presenting with generalized weakness, ambulatory dysfunction and left knee pain and admitted for AKI and mild rhabdomyolysis.  Knee and hip imaging without acute finding but degenerative changes due to osteoarthritis.  Apparently, patient has been avoiding to see orthopedic surgery out of fear for total knee replacement.  AKI and rhabdomyolysis seems to have resolved.  Therapy recommended SNF.  Waiting on insurance authorization.  Subjective: Seen and examined earlier this morning.  No major events overnight of this morning.  She complains left hip pain which is worse with weightbearing.  She denies chest pain, dyspnea, GI or UTI symptoms.  Objective: Vitals:   11/20/20 1926 11/20/20 2106 11/21/20 0532 11/21/20 1032  BP: 112/65 110/64 111/72 (!) 149/73  Pulse: 90 90 94 97  Resp:  19 19 18   Temp: 98.3 F (36.8 C) 98.4 F (36.9 C) 97.7 F (36.5 C) 98.3 F (36.8 C)  TempSrc: Oral Oral Oral Oral  SpO2: 92% 91% 93% 90%  Weight:      Height:        Intake/Output Summary (Last 24 hours) at 11/21/2020 1650 Last data filed at 11/21/2020 1034 Gross per 24 hour  Intake 480 ml  Output 2600 ml  Net -2120 ml   Filed Weights   11/17/20 1524  Weight: (!) 158.8 kg    Examination:  GENERAL: No apparent distress.  Nontoxic. HEENT: MMM.  Vision and hearing grossly intact.  NECK: Supple.  No apparent JVD.  RESP: On RA.  No IWOB.  Fair aeration bilaterally. CVS:  RRR. Heart sounds normal.  ABD/GI/GU: BS+. Abd soft, NTND.  MSK/EXT:  Moves extremities. No apparent deformity.  Trace edema, right greater than left SKIN: no apparent skin  lesion or wound NEURO: Awake, alert and oriented appropriately.  No apparent focal neuro deficit other than mild BLE weakness. PSYCH: Calm. Normal affect.   Procedures:  None  Microbiology summarized: COVID-19 and influenza PCR nonreactive.  Assessment & Plan: AKI on CKD-3B/azotemia: Likely from rhabdo, diuretics and ARB.  She is on Lasix, Aldactone and losartan at home.  AKI and azotemia resolved. Recent Labs    03/09/20 1501 03/20/20 1145 06/21/20 1129 11/17/20 1526 11/18/20 0548 11/19/20 0239 11/20/20 0251  BUN 19 17 33* 39* 36* 32* 23  CREATININE 1.66* 1.55* 1.79* 2.15* 1.96* 1.83* 1.58*  -Continue monitoring -Resume home Lasix. -Continue holding Aldactone and losartan.  Chronic diastolic CHF: TTE in 2019 with LVEF of 65 to 70%, and indeterminate DD due to A. fib.  Has bilateral lower extremity edema but no significant cardiopulmonary symptoms.  She is on Lasix and Aldactone at home. -Resume home Lasix -Continue holding Aldactone and losartan -I would avoid/discontinue Actos on discharge. -Monitor fluid status, renal function and electrolytes.  Mild rhabdomyolysis: Resolved.  Controlled NIDDM-2 with hyperglycemia: A1c 6.9% (from 9.9 in 06/2020).  On Actos, glipizide, Jardiance and metformin at home. Recent Labs  Lab 11/20/20 1724 11/20/20 2106 11/21/20 0733 11/21/20 1144 11/21/20 1620  GLUCAP 176* 155* 160* 110* 202*  -Continue current insulin regimen while in-house -Recommend discontinuing Actos and reducing metformin and Jardiance on discharge -Start statin if no statin intolerance.  Debility/generalized weakness/ambulatory dysfunction due to left knee pain/osteoarthritis -Has been avoiding Ortho visit out of fear for TKA. -Needs to follow-up with her orthopedic surgeon.   -Continue therapy.  SNF recommended.     Permanent atrial fibrillation: Rate controlled on Cardizem and metoprolol. -Continue home Cardizem, metoprolol and Xarelto -Cardizem is not a  great choice given his CHF but defer to her cardiologist.   Essential hypertension: Normotensive.   -Continue Cardizem and metoprolol  -Losartan and Aldactone on hold in the setting of AKI  Morbid obesity Body mass index is 47.47 kg/m.  -Could benefit from GLP-1 inhibitors     Pressure Injury 11/18/20 Buttocks Left Deep Tissue Pressure Injury - Purple or maroon localized area of discolored intact skin or blood-filled blister due to damage of underlying soft tissue from pressure and/or shear. (Active)  11/18/20 0400  Location: Buttocks  Location Orientation: Left  Staging: Deep Tissue Pressure Injury - Purple or maroon localized area of discolored intact skin or blood-filled blister due to damage of underlying soft tissue from pressure and/or shear.  Wound Description (Comments):   Present on Admission: Yes   DVT prophylaxis:  rivaroxaban (XARELTO) tablet 20 mg Start: 11/18/20 1700 rivaroxaban (XARELTO) tablet 20 mg  Code Status: Full code Family Communication: Patient and/or RN. Available if any question.  Level of care: Med-Surg Status is: Inpatient  Remains inpatient appropriate because:Unsafe d/c plan  Dispo: The patient is from: Home              Anticipated d/c is to: SNF              Patient currently is medically stable to d/c.   Difficult to place patient No       Consultants:  None   Sch Meds:  Scheduled Meds:  diltiazem  360 mg Oral Daily   furosemide  40 mg Oral Daily   insulin aspart  0-9 Units Subcutaneous TID WC   metoprolol succinate  100 mg Oral Daily   omega-3 acid ethyl esters  2 g Oral BID   pregabalin  50 mg Oral TID   rivaroxaban  20 mg Oral Q supper   Continuous Infusions: PRN Meds:.acetaminophen **OR** acetaminophen, metoprolol tartrate, polyethylene glycol, senna-docusate  Antimicrobials: Anti-infectives (From admission, onward)    None        I have personally reviewed the following labs and images: CBC: Recent Labs  Lab  11/17/20 1526 11/18/20 0548 11/19/20 0239  WBC 13.1* 11.1* 8.8  NEUTROABS 10.9* 8.8* 6.2  HGB 11.9* 11.1* 10.6*  HCT 38.9 36.5 34.2*  MCV 84.6 84.7 84.7  PLT 299 264 227   BMP &GFR Recent Labs  Lab 11/17/20 1526 11/18/20 0548 11/19/20 0239 11/20/20 0251  NA 138 140 138 136  K 3.8 3.7 3.6 3.6  CL 104 105 107 104  CO2 24 23 21* 25  GLUCOSE 159* 163* 154* 142*  BUN 39* 36* 32* 23  CREATININE 2.15* 1.96* 1.83* 1.58*  CALCIUM 9.7 9.2 8.5* 8.5*   Estimated Creatinine Clearance: 61 mL/min (A) (by C-G formula based on SCr of 1.58 mg/dL (H)). Liver & Pancreas: Recent Labs  Lab 11/18/20 0548  AST 27  ALT 21  ALKPHOS 53  BILITOT 1.2  PROT 6.5  ALBUMIN 3.2*   No results for input(s): LIPASE, AMYLASE in the last 168 hours. No results for input(s): AMMONIA in the last 168 hours. Diabetic: No results for input(s): HGBA1C in the last 72 hours. Recent Labs  Lab 11/20/20 1724 11/20/20 2106  11/21/20 0733 11/21/20 1144 11/21/20 1620  GLUCAP 176* 155* 160* 110* 202*   Cardiac Enzymes: Recent Labs  Lab 11/17/20 2332 11/18/20 0548 11/19/20 0239  CKTOTAL 787* 668* 303*   Recent Labs    03/20/20 1145 06/21/20 1129  PROBNP 125.0* 139.0*   Coagulation Profile: No results for input(s): INR, PROTIME in the last 168 hours. Thyroid Function Tests: No results for input(s): TSH, T4TOTAL, FREET4, T3FREE, THYROIDAB in the last 72 hours. Lipid Profile: No results for input(s): CHOL, HDL, LDLCALC, TRIG, CHOLHDL, LDLDIRECT in the last 72 hours. Anemia Panel: No results for input(s): VITAMINB12, FOLATE, FERRITIN, TIBC, IRON, RETICCTPCT in the last 72 hours. Urine analysis:    Component Value Date/Time   COLORURINE YELLOW 06/21/2020 1129   APPEARANCEUR CLEAR 06/21/2020 1129   LABSPEC 1.010 06/21/2020 1129   PHURINE 5.5 06/21/2020 1129   GLUCOSEU >=1000 (A) 06/21/2020 1129   HGBUR NEGATIVE 06/21/2020 1129   BILIRUBINUR NEGATIVE 06/21/2020 1129   KETONESUR NEGATIVE  06/21/2020 1129   UROBILINOGEN 0.2 06/21/2020 1129   NITRITE NEGATIVE 06/21/2020 1129   LEUKOCYTESUR NEGATIVE 06/21/2020 1129   Sepsis Labs: Invalid input(s): PROCALCITONIN, LACTICIDVEN  Microbiology: Recent Results (from the past 240 hour(s))  Resp Panel by RT-PCR (Flu A&B, Covid) Nasopharyngeal Swab     Status: None   Collection Time: 11/18/20  2:32 AM   Specimen: Nasopharyngeal Swab; Nasopharyngeal(NP) swabs in vial transport medium  Result Value Ref Range Status   SARS Coronavirus 2 by RT PCR NEGATIVE NEGATIVE Final    Comment: (NOTE) SARS-CoV-2 target nucleic acids are NOT DETECTED.  The SARS-CoV-2 RNA is generally detectable in upper respiratory specimens during the acute phase of infection. The lowest concentration of SARS-CoV-2 viral copies this assay can detect is 138 copies/mL. A negative result does not preclude SARS-Cov-2 infection and should not be used as the sole basis for treatment or other patient management decisions. A negative result may occur with  improper specimen collection/handling, submission of specimen other than nasopharyngeal swab, presence of viral mutation(s) within the areas targeted by this assay, and inadequate number of viral copies(<138 copies/mL). A negative result must be combined with clinical observations, patient history, and epidemiological information. The expected result is Negative.  Fact Sheet for Patients:  BloggerCourse.comhttps://www.fda.gov/media/152166/download  Fact Sheet for Healthcare Providers:  SeriousBroker.ithttps://www.fda.gov/media/152162/download  This test is no t yet approved or cleared by the [place] FDA and  has been authorized for detection and/or diagnosis of SARS-CoV-2 by FDA under an Emergency Use Authorization (EUA). This EUA will remain  in effect (meaning this test can be used) for the duration of the COVID-19 declaration under Section 564(b)(1) of the Act, 21 U.S.C.section 360bbb-3(b)(1), unless the authorization is terminated   or revoked sooner.       Influenza A by PCR NEGATIVE NEGATIVE Final   Influenza B by PCR NEGATIVE NEGATIVE Final    Comment: (NOTE) The Xpert Xpress SARS-CoV-2/FLU/RSV plus assay is intended as an aid in the diagnosis of influenza from Nasopharyngeal swab specimens and should not be used as a sole basis for treatment. Nasal washings and aspirates are unacceptable for Xpert Xpress SARS-CoV-2/FLU/RSV testing.  Fact Sheet for Patients: BloggerCourse.comhttps://www.fda.gov/media/152166/download  Fact Sheet for Healthcare Providers: SeriousBroker.ithttps://www.fda.gov/media/152162/download  This test is not yet approved or cleared by the [place] FDA and has been authorized for detection and/or diagnosis of SARS-CoV-2 by FDA under an Emergency Use Authorization (EUA). This EUA will remain in effect (meaning this test can be used) for the duration of the COVID-19 declaration  under Section 564(b)(1) of the Act, 21 U.S.C. section 360bbb-3(b)(1), unless the authorization is terminated or revoked.  Performed at [facility], [address] [place], [place] [address]     Radiology Studies: No results found.    Emmitte Surgeon T. Ronelle Michie [facility]  If 7PM-7AM, please contact night-coverage [email or web address] 11/21/2020, 4:50 PM

## 2020-11-22 DIAGNOSIS — E860 Dehydration: Secondary | ICD-10-CM

## 2020-11-22 DIAGNOSIS — R748 Abnormal levels of other serum enzymes: Secondary | ICD-10-CM

## 2020-11-22 LAB — CBC
HCT: 36.3 % (ref 36.0–46.0)
Hemoglobin: 11.3 g/dL — ABNORMAL LOW (ref 12.0–15.0)
MCH: 26.2 pg (ref 26.0–34.0)
MCHC: 31.1 g/dL (ref 30.0–36.0)
MCV: 84 fL (ref 80.0–100.0)
Platelets: 225 10*3/uL (ref 150–400)
RBC: 4.32 MIL/uL (ref 3.87–5.11)
RDW: 15.6 % — ABNORMAL HIGH (ref 11.5–15.5)
WBC: 10.2 10*3/uL (ref 4.0–10.5)
nRBC: 0 % (ref 0.0–0.2)

## 2020-11-22 LAB — RENAL FUNCTION PANEL
Albumin: 3 g/dL — ABNORMAL LOW (ref 3.5–5.0)
Anion gap: 10 (ref 5–15)
BUN: 20 mg/dL (ref 8–23)
CO2: 27 mmol/L (ref 22–32)
Calcium: 9.2 mg/dL (ref 8.9–10.3)
Chloride: 101 mmol/L (ref 98–111)
Creatinine, Ser: 1.56 mg/dL — ABNORMAL HIGH (ref 0.44–1.00)
GFR, Estimated: 37 mL/min — ABNORMAL LOW (ref >60)
Glucose, Bld: 185 mg/dL — ABNORMAL HIGH (ref 70–99)
Phosphorus: 2.9 mg/dL (ref 2.5–4.6)
Potassium: 3.5 mmol/L (ref 3.5–5.1)
Sodium: 138 mmol/L (ref 135–145)

## 2020-11-22 LAB — MAGNESIUM: Magnesium: 1.8 mg/dL (ref 1.7–2.4)

## 2020-11-22 LAB — GLUCOSE, CAPILLARY
Glucose-Capillary: 179 mg/dL — ABNORMAL HIGH (ref 70–99)
Glucose-Capillary: 197 mg/dL — ABNORMAL HIGH (ref 70–99)
Glucose-Capillary: 210 mg/dL — ABNORMAL HIGH (ref 70–99)
Glucose-Capillary: 207 mg/dL — ABNORMAL HIGH (ref 70–99)

## 2020-11-22 LAB — CK: Total CK: 76 U/L (ref 38–234)

## 2020-11-22 NOTE — Progress Notes (Signed)
PROGRESS NOTE    Pamela [name]  WNI:[id number] DOB: 09-05-1955 DOA: 11/17/2020 PCP: Corwin Levins, MD     Brief Narrative:  65 year old WF PMHx  DM-2, HTN, A. fib on Xarelto, diastolic CHF, CKD-3B and morbid obesity   Presenting with generalized weakness, ambulatory dysfunction and left knee pain and admitted for AKI and mild rhabdomyolysis.  Knee and hip imaging without acute finding but degenerative changes due to osteoarthritis.  Apparently, patient has been avoiding to see orthopedic surgery out of fear for total knee replacement.  AKI and rhabdomyolysis seems to have resolved.  Therapy recommended SNF.  Waiting on insurance authorization.   Subjective: Afebrile overnight, A/O x4.  States has been sitting up on edge of bed to eat meals.  Secondary to LEFT hip pain does not think she would be able to pivot to chair at this point.   Assessment & Plan: Covid vaccination; vaccinated 2/3   Principal Problem:   ARF (acute renal failure) (HCC) Active Problems:   Diabetes (HCC)   Elevated CK   Acute pain of left knee   AKI (acute kidney injury) (HCC)  AKI on CKD-3B/azotemia: Likely from rhabdo, diuretics and ARB.  She is on Lasix, Aldactone and losartan at home.  AKI and azotemia resolved. Lab Results  Component Value Date   CREATININE 1.56 (H) 11/22/2020   CREATININE 1.58 (H) 11/20/2020   CREATININE 1.83 (H) 11/19/2020   CREATININE 1.96 (H) 11/18/2020   CREATININE 2.15 (H) 11/17/2020  -Continue monitoring -Resume home Lasix. -Continue holding Aldactone and losartan.   Chronic diastolic CHF: TTE in 2019 with LVEF of 65 to 70%, and indeterminate DD due to A. fib.  Has bilateral lower extremity edema but no significant cardiopulmonary symptoms.  She is on Lasix and Aldactone at home. -Resume home Lasix -Continue holding Aldactone and losartan -I would avoid/discontinue Actos on discharge. -Monitor fluid status, renal function and electrolytes.   Mild rhabdomyolysis:  Resolved.   Controlled NIDDM-2 with hyperglycemia: A1c 6.9% (from 9.9 in 06/2020).  On Actos, glipizide, Jardiance and metformin at home. Lab Results  Component Value Date   GLUCAP 179 (H) 11/22/2020   GLUCAP 189 (H) 11/21/2020   GLUCAP 202 (H) 11/21/2020   GLUCAP 110 (H) 11/21/2020   GLUCAP 160 (H) 11/21/2020  Continue current insulin regimen while in-house -Recommend discontinuing Actos and reducing metformin and Jardiance on discharge -Start statin if no statin intolerance.   Debility/generalized weakness/ambulatory dysfunction due to left knee pain/osteoarthritis -Has been avoiding Ortho visit out of fear for TKA. -Needs to follow-up with her orthopedic surgeon.   -Continue therapy.  SNF recommended.     Permanent atrial fibrillation: Rate controlled on Cardizem and metoprolol. -Continue home Cardizem, metoprolol and Xarelto -Cardizem is not a great choice given his CHF but defer to her cardiologist.   Essential hypertension: Normotensive.   -Continue Cardizem and metoprolol  -Losartan and Aldactone on hold in the setting of AKI   Morbid obesity Body mass index is 47.47 kg/m.  -Could benefit from GLP-1 inhibitors  Pressure Injury 11/18/20 Buttocks Left Deep Tissue Pressure Injury - Purple or maroon localized area of discolored intact skin or blood-filled blister due to damage of underlying soft tissue from pressure and/or shear. (Active)  11/18/20 0400  Location: Buttocks  Location Orientation: Left  Staging: Deep Tissue Pressure Injury - Purple or maroon localized area of discolored intact skin or blood-filled blister due to damage of underlying soft tissue from pressure and/or shear.  Wound Description (Comments):   Present  on Admission: Yes           Body mass index is 47.47 kg/m.     DVT prophylaxis:  Code Status:  Family Communication:  Status is: Inpatient    Dispo: The patient is from:               Anticipated d/c is to:                Anticipated d/c date is:               Patient currently       Consultants:    Procedures/Significant Events:    I have personally reviewed and interpreted all radiology studies and my findings are as above.  VENTILATOR SETTINGS:    Cultures   Antimicrobials:    Devices    LINES / TUBES:      Continuous Infusions:   Objective: Vitals:   11/21/20 1032 11/21/20 1811 11/21/20 2052 11/22/20 0500  BP: (!) 149/73 117/88 112/63 (!) 108/46  Pulse: 97 (!) 58 71 (!) 110  Resp: 18 18 19 18   Temp: 98.3 F (36.8 C) 99.2 F (37.3 C) 98.4 F (36.9 C) (!) 97.4 F (36.3 C)  TempSrc: Oral Oral Oral Oral  SpO2: 90% 94% 93% 91%  Weight:      Height:        Intake/Output Summary (Last 24 hours) at 11/22/2020 0920 Last data filed at 11/22/2020 0500 Gross per 24 hour  Intake 240 ml  Output 3500 ml  Net -3260 ml   Filed Weights   11/17/20 1524  Weight: (!) 158.8 kg    Examination:  General: A/O x4, No acute respiratory distress Eyes: negative scleral hemorrhage, negative anisocoria, negative icterus ENT: Negative Runny nose, negative gingival bleeding, Neck:  Negative scars, masses, torticollis, lymphadenopathy, JVD Lungs: Clear to auscultation bilaterally without wheezes or crackles Cardiovascular: Regular rate and rhythm without murmur gallop or rub normal S1 and S2 Abdomen: MORBIDLY OBESE, negative abdominal pain, nondistended, positive soft, bowel sounds, no rebound, no ascites, no appreciable mass Extremities: No significant cyanosis, clubbing, or edema bilateral lower extremities Skin: Negative rashes, lesions, ulcers Psychiatric:  Negative depression, negative anxiety, negative fatigue, negative mania  Central nervous system:  Cranial nerves II through XII intact, tongue/uvula midline, all extremities muscle strength 5/5, sensation intact throughout,  negative dysarthria, negative expressive aphasia, negative receptive aphasia.  .     Data Reviewed:  Care during the described time interval was provided by me .  I have reviewed this patient's available data, including medical history, events of note, physical examination, and all test results as part of my evaluation.  CBC: Recent Labs  Lab 11/17/20 1526 11/18/20 0548 11/19/20 0239 11/22/20 0239  WBC 13.1* 11.1* 8.8 10.2  NEUTROABS 10.9* 8.8* 6.2  --   HGB 11.9* 11.1* 10.6* 11.3*  HCT 38.9 36.5 34.2* 36.3  MCV 84.6 84.7 84.7 84.0  PLT 299 264 227 225   Basic Metabolic Panel: Recent Labs  Lab 11/17/20 1526 11/18/20 0548 11/19/20 0239 11/20/20 0251 11/22/20 0239  NA 138 140 138 136 138  K 3.8 3.7 3.6 3.6 3.5  CL 104 105 107 104 101  CO2 24 23 21* 25 27  GLUCOSE 159* 163* 154* 142* 185*  BUN 39* 36* 32* 23 20  CREATININE 2.15* 1.96* 1.83* 1.58* 1.56*  CALCIUM 9.7 9.2 8.5* 8.5* 9.2  MG  --   --   --   --  1.8  PHOS  --   --   --   --  2.9   GFR: Estimated Creatinine Clearance: 61.8 mL/min (A) (by C-G formula based on SCr of 1.56 mg/dL (H)). Liver Function Tests: Recent Labs  Lab 11/18/20 0548 11/22/20 0239  AST 27  --   ALT 21  --   ALKPHOS 53  --   BILITOT 1.2  --   PROT 6.5  --   ALBUMIN 3.2* 3.0*   No results for input(s): LIPASE, AMYLASE in the last 168 hours. No results for input(s): AMMONIA in the last 168 hours. Coagulation Profile: No results for input(s): INR, PROTIME in the last 168 hours. Cardiac Enzymes: Recent Labs  Lab 11/17/20 2332 11/18/20 0548 11/19/20 0239 11/22/20 0239  CKTOTAL 787* 668* 303* 76   BNP (last 3 results) Recent Labs    03/20/20 1145 06/21/20 1129  PROBNP 125.0* 139.0*   HbA1C: No results for input(s): HGBA1C in the last 72 hours. CBG: Recent Labs  Lab 11/21/20 0733 11/21/20 1144 11/21/20 1620 11/21/20 2052 11/22/20 0648  GLUCAP 160* 110* 202* 189* 179*   Lipid Profile: No results for input(s): CHOL, HDL, LDLCALC, TRIG, CHOLHDL, LDLDIRECT in the last 72 hours. Thyroid Function Tests: No results for  input(s): TSH, T4TOTAL, FREET4, T3FREE, THYROIDAB in the last 72 hours. Anemia Panel: No results for input(s): VITAMINB12, FOLATE, FERRITIN, TIBC, IRON, RETICCTPCT in the last 72 hours. Sepsis Labs: No results for input(s): PROCALCITON, LATICACIDVEN in the last 168 hours.  Recent Results (from the past 240 hour(s))  Resp Panel by RT-PCR (Flu A&B, Covid) Nasopharyngeal Swab     Status: None   Collection Time: 11/18/20  2:32 AM   Specimen: Nasopharyngeal Swab; Nasopharyngeal(NP) swabs in vial transport medium  Result Value Ref Range Status   SARS Coronavirus 2 by RT PCR NEGATIVE NEGATIVE Final    Comment: (NOTE) SARS-CoV-2 target nucleic acids are NOT DETECTED.  The SARS-CoV-2 RNA is generally detectable in upper respiratory specimens during the acute phase of infection. The lowest concentration of SARS-CoV-2 viral copies this assay can detect is 138 copies/mL. A negative result does not preclude SARS-Cov-2 infection and should not be used as the sole basis for treatment or other patient management decisions. A negative result may occur with  improper specimen collection/handling, submission of specimen other than nasopharyngeal swab, presence of viral mutation(s) within the areas targeted by this assay, and inadequate number of viral copies(<138 copies/mL). A negative result must be combined with clinical observations, patient history, and epidemiological information. The expected result is Negative.  Fact Sheet for Patients:  BloggerCourse.comhttps://www.fda.gov/media/152166/download  Fact Sheet for Healthcare Providers:  SeriousBroker.ithttps://www.fda.gov/media/152162/download  This test is no t yet approved or cleared by the [place] FDA and  has been authorized for detection and/or diagnosis of SARS-CoV-2 by FDA under an Emergency Use Authorization (EUA). This EUA will remain  in effect (meaning this test can be used) for the duration of the COVID-19 declaration under Section 564(b)(1) of the Act,  21 U.S.C.section 360bbb-3(b)(1), unless the authorization is terminated  or revoked sooner.       Influenza A by PCR NEGATIVE NEGATIVE Final   Influenza B by PCR NEGATIVE NEGATIVE Final    Comment: (NOTE) The Xpert Xpress SARS-CoV-2/FLU/RSV plus assay is intended as an aid in the diagnosis of influenza from Nasopharyngeal swab specimens and should not be used as a sole basis for treatment. Nasal washings and aspirates are unacceptable for Xpert Xpress SARS-CoV-2/FLU/RSV testing.  Fact Sheet for Patients: BloggerCourse.comhttps://www.fda.gov/media/152166/download  Fact Sheet for Healthcare Providers: SeriousBroker.ithttps://www.fda.gov/media/152162/download  This test is not yet approved  or cleared by the [place] and has been authorized for detection and/or diagnosis of SARS-CoV-2 by FDA under an Emergency Use Authorization (EUA). This EUA will remain in effect (meaning this test can be used) for the duration of the COVID-19 declaration under Section 564(b)(1) of the Act, 21 U.S.C. section 360bbb-3(b)(1), unless the authorization is terminated or revoked.  Performed at [facility], [address] [place], [place] [address]          Radiology Studies: No results found.      Scheduled Meds:  diltiazem  360 mg Oral Daily   furosemide  40 mg Oral Daily   insulin aspart  0-9 Units Subcutaneous TID WC   metoprolol succinate  100 mg Oral Daily   omega-3 acid ethyl esters  2 g Oral BID   pregabalin  50 mg Oral TID   rivaroxaban  20 mg Oral Q supper   Continuous Infusions:   LOS: 4 days    Time spent:40 min    Jayle Solarz, Roselind Messier, MD [facility]   If 7PM-7AM, please contact night-coverage 11/22/2020, 9:20 AM

## 2020-11-22 NOTE — Plan of Care (Signed)
  Problem: Clinical Measurements: Goal: Respiratory complications will improve Outcome: Progressing   Problem: Nutrition: Goal: Adequate nutrition will be maintained Outcome: Progressing   Problem: Coping: Goal: Level of anxiety will decrease Outcome: Progressing   

## 2020-11-23 LAB — CBC
HCT: 38.5 % (ref 36.0–46.0)
Hemoglobin: 11.8 g/dL — ABNORMAL LOW (ref 12.0–15.0)
MCH: 25.9 pg — ABNORMAL LOW (ref 26.0–34.0)
MCHC: 30.6 g/dL (ref 30.0–36.0)
MCV: 84.4 fL (ref 80.0–100.0)
Platelets: 243 10*3/uL (ref 150–400)
RBC: 4.56 MIL/uL (ref 3.87–5.11)
RDW: 15.7 % — ABNORMAL HIGH (ref 11.5–15.5)
WBC: 9.7 10*3/uL (ref 4.0–10.5)
nRBC: 0 % (ref 0.0–0.2)

## 2020-11-23 LAB — COMPREHENSIVE METABOLIC PANEL
ALT: 18 U/L (ref 0–44)
AST: 13 U/L — ABNORMAL LOW (ref 15–41)
Albumin: 3.1 g/dL — ABNORMAL LOW (ref 3.5–5.0)
Alkaline Phosphatase: 64 U/L (ref 38–126)
Anion gap: 12 (ref 5–15)
BUN: 20 mg/dL (ref 8–23)
CO2: 28 mmol/L (ref 22–32)
Calcium: 9.4 mg/dL (ref 8.9–10.3)
Chloride: 98 mmol/L (ref 98–111)
Creatinine, Ser: 1.53 mg/dL — ABNORMAL HIGH (ref 0.44–1.00)
GFR, Estimated: 38 mL/min — ABNORMAL LOW (ref >60)
Glucose, Bld: 206 mg/dL — ABNORMAL HIGH (ref 70–99)
Potassium: 3.5 mmol/L (ref 3.5–5.1)
Sodium: 138 mmol/L (ref 135–145)
Total Bilirubin: 0.5 mg/dL (ref 0.3–1.2)
Total Protein: 6.4 g/dL — ABNORMAL LOW (ref 6.5–8.1)

## 2020-11-23 LAB — GLUCOSE, CAPILLARY
Glucose-Capillary: 204 mg/dL — ABNORMAL HIGH (ref 70–99)
Glucose-Capillary: 202 mg/dL — ABNORMAL HIGH (ref 70–99)
Glucose-Capillary: 241 mg/dL — ABNORMAL HIGH (ref 70–99)
Glucose-Capillary: 168 mg/dL — ABNORMAL HIGH (ref 70–99)

## 2020-11-23 LAB — PHOSPHORUS: Phosphorus: 3.3 mg/dL (ref 2.5–4.6)

## 2020-11-23 LAB — RESP PANEL BY RT-PCR (FLU A&B, COVID) ARPGX2
Influenza A by PCR: NEGATIVE
Influenza B by PCR: NEGATIVE
SARS Coronavirus 2 by RT PCR: NEGATIVE

## 2020-11-23 LAB — MAGNESIUM: Magnesium: 1.8 mg/dL (ref 1.7–2.4)

## 2020-11-23 NOTE — Progress Notes (Signed)
Physical Therapy Treatment Patient Details Name: JHANAE JASKOWIAK MRN: [id number] DOB: [birth year] Today's Date: 11/23/2020    History of Present Illness Pt is a 65 y/o female admitted secondary to difficulty walking and unable to get up from the commode at home.  Patient had to sit on the commode for many hours following which patient had to roll onto the floor to reach the phone and called her brother.  Patient was having significant pain of the left knee.  She has not had any food for almost 48 hours and has not taken her medications. PMH including but not limited to DM, HTN, A. fib, diastolic CHF, CKD.    PT Comments    Pt received in supine, agreeable to therapy session and with good participation and fair tolerance for transfer training. Reviewed supine HEP and encourage to perform bed-level exercises multiple times a day. Pt Supervision for bed mobility and up to modA +2 for sit<>stand transfer, but L knee pain remains too limiting to stand >10 seconds in crouched posture at bedside and unable to weight shift in stance. Anticipate pt will do best with slide board transfers to drop arm bariatric commode/recliner next session due to continued LLE pain with standing attempts. Pt continues to benefit from PT services to progress toward functional mobility goals.    Follow Up Recommendations  SNF     Equipment Recommendations  None recommended by PT    Recommendations for Other Services       Precautions / Restrictions Precautions Precautions: Fall Precaution Comments: L knee pain Restrictions Weight Bearing Restrictions: No    Mobility  Bed Mobility Overal bed mobility: Needs Assistance Bed Mobility: Supine to Sit;Rolling;Sit to Supine Rolling: Supervision   Supine to sit: Supervision Sit to supine: Supervision   General bed mobility comments: assist for safety, increased time to initiate/perform and use of bed rail/features    Transfers Overall transfer level: Needs  assistance Equipment used: Rolling walker (2 wheeled) Transfers: Sit to/from Stand;Lateral/Scoot Transfers Sit to Stand: Mod assist;+2 physical assistance;+2 safety/equipment;From elevated surface        Lateral/Scoot Transfers: Min guard General transfer comment: Attempted x2 to stand from bed with walker. Unable to stand with +2 mod assist due to L knee pain. On best attempt able to stand at [facility] with trunk significantly flexed (while RN replaced sacral foam dressing) <10 seconds; pt encouraged to use rocking for momentum and LLE advanced. Tried scooting on EOB with pt able to scoot ~1.5 foot with min guard.  Ambulation/Gait             General Gait Details: unable   Stairs             Wheelchair Mobility    Modified Rankin (Stroke Patients Only)       Balance Overall balance assessment: Needs assistance Sitting-balance support: Feet supported Sitting balance-[name]: Fair Sitting balance - Comments: static sitting and weight shifting supervision at most no LOB   Standing balance support: Bilateral upper extremity supported Standing balance-[name]: Poor Standing balance comment: pain limiting, pt needing +2 modA to remain upright up to 10 seconds at a time.                            Cognition Arousal/Alertness: Awake/alert Behavior During Therapy: WFL for tasks assessed/performed Overall Cognitive Status: Within Functional Limits for tasks assessed  General Comments: appears depressed and at times tearful but participatory as able      Exercises Other Exercises Other Exercises: reviewed supine warm-up exercises AP, QS, GS x5 reps ea encouraged her to perform 4x/day at least 10 reps    General Comments General comments (skin integrity, edema, etc.): HR 69 bpm with sitting, pt w/o acute s/sx distress during session and VSS per chart review not further assessed.      Pertinent Vitals/Pain  Pain Assessment: Faces Faces Pain Scale: Hurts even more Pain Location: L knee>hip with mobility Pain Descriptors / Indicators: Grimacing;Guarding;Discomfort (appearing tearful with transfers) Pain Intervention(s): Monitored during session;Repositioned;Limited activity within patient's tolerance (offered to request pain meds from RN pt deferring)    Home Living                      Prior Function            PT Goals (current goals can now be found in the care plan section) Acute Rehab PT Goals Patient Stated Goal: decrease pain PT Goal Formulation: With patient Time For Goal Achievement: 12/02/20 Progress towards PT goals: Progressing toward goals    Frequency    Min 2X/week      PT Plan Current plan remains appropriate    Co-evaluation PT/OT/SLP Co-Evaluation/Treatment: Yes Reason for Co-Treatment: For patient/therapist safety;To address functional/ADL transfers PT goals addressed during session: Mobility/safety with mobility;Balance;Proper use of DME;Strengthening/ROM        AM-PAC PT "6 Clicks" Mobility   Outcome Measure  Help needed turning from your back to your side while in a flat bed without using bedrails?: None Help needed moving from lying on your back to sitting on the side of a flat bed without using bedrails?: None Help needed moving to and from a bed to a chair (including a wheelchair)?: A Lot Help needed standing up from a chair using your arms (e.g., wheelchair or bedside chair)?: A Lot Help needed to walk in hospital room?: Total Help needed climbing 3-5 steps with a railing? : Total 6 Click Score: 14    End of Session Equipment Utilized During Treatment: Gait belt Activity Tolerance: Patient limited by pain (L knee pain) Patient left: in bed;with call bell/phone within reach (tried to set bed alarm but sensor not working correctly, pt able to use call bell appropriately will not attempt OOB) Nurse Communication: Mobility status;Need for  lift equipment (hoyer lift to chair, pt deferring pain meds) PT Visit Diagnosis: Other abnormalities of gait and mobility (R26.89);Pain Pain - Right/Left: Left Pain - part of body: Knee;Hip     Time: 7672-0947 PT Time Calculation (min) (ACUTE ONLY): 32 min  Charges:  $Therapeutic Activity: 8-22 mins                     Khyli Swaim P., PTA Acute Rehabilitation Services Pager: [phone] Office: [phone]    Dorathy Kinsman Carrah Eppolito 11/23/2020, 3:00 PM

## 2020-11-23 NOTE — TOC Progression Note (Addendum)
Transition of Care [facility]) - Progression Note    Patient Details  Name: Pamela [name] MRN: [id number] Date of Birth: 1955-12-26  Transition of [facility]) CM/SW Contact  Okey Dupre Lazaro Arms, LCSW Phone Number: 11/23/2020, 6:03 PM  Clinical Narrative:  Patient is medically stable and was to discharge to [facility] today. COVID test was done and submitted this morning but has not resulted as of 6:04 pm. MD has cancelled discharge for today and will d/c patient tomorrow morning. CSW contacted admissions director at [facility] and provided update. Visited room and updated patient.  Admissions director requested that the d/c summary be faxed to [phone]. Patient is going to Room 117 A. Number for report: [phone]. Called [facility] and cancelled today's transport. Patient informed.      Expected Discharge Plan: Skilled Nursing Facility Barriers to Discharge: Continued Medical Work up  Expected Discharge Plan and Services Expected Discharge Plan: Skilled Nursing Facility In-house Referral: Clinical Social Work Discharge Planning Services: CM Consult Post Acute Care Choice: Skilled Nursing Facility Living arrangements for the past 2 months: Apartment Expected Discharge Date: 11/21/20                                   Social Determinants of Health (SDOH) Interventions  No SDOH interventions requested or needed at this time.  Readmission Risk Interventions No flowsheet data found.

## 2020-11-23 NOTE — Discharge Summary (Signed)
Physician Discharge Summary  Pamela [name] CQF:[id number] DOB: 1956-04-08 DOA: 11/17/2020  PCP: Corwin Levins, MD  Admit date: 11/17/2020 Discharge date: 11/24/2020  Time spent: 35 minutes  Recommendations for Outpatient Follow-up:   Covid vaccination; vaccinated 2/3   AKI on CKD-3B/Azotemia:  Likely from rhabdo, diuretics and ARB.  She is on Lasix, Aldactone and losartan at home.  AKI and azotemia resolved. Lab Results  Component Value Date   CREATININE 1.51 (H) 11/24/2020   CREATININE 1.53 (H) 11/23/2020   CREATININE 1.56 (H) 11/22/2020   CREATININE 1.58 (H) 11/20/2020   CREATININE 1.83 (H) 11/19/2020  -Continue monitoring -Resume home Lasix. -Continue holding Aldactone and losartan.   Chronic diastolic CHF:  -Echocardiogram  2019 with LVEF of 65 to 70%, and indeterminate DD due to A. fib.  Has bilateral lower extremity edema but no significant cardiopulmonary symptoms.   -Resume home Lasix -Continue holding Aldactone and losartan -I would avoid/discontinue Actos on discharge. -Monitor fluid status, renal function and electrolytes.   Mild rhabdomyolysis: Resolved.   Controlled NIDDM-2 with hyperglycemia:  -6/12 hemoglobin A1c = 6.9%   Lab Results  Component Value Date   GLUCAP 174 (H) 11/24/2020   GLUCAP 168 (H) 11/23/2020   GLUCAP 241 (H) 11/23/2020   GLUCAP 202 (H) 11/23/2020   GLUCAP 204 (H) 11/23/2020   Continue current insulin regimen while in-house -Recommend discontinuing Actos and reducing metformin and Jardiance on discharge -Start statin if no statin intolerance.   Debility/generalized weakness/ambulatory dysfunction due to left knee pain/osteoarthritis -Has been avoiding Ortho visit out of fear for TKA. -Needs to follow-up with her orthopedic surgeon.   -Continue therapy.  SNF recommended.     Permanent atrial fibrillation: Rate controlled on Cardizem and metoprolol. -Continue home Cardizem, metoprolol and Xarelto -Cardizem is not a great choice  given his CHF but defer to her cardiologist.   Essential hypertension: Normotensive.   -Continue Cardizem and metoprolol  -Losartan and Aldactone on hold in the setting of AKI   Morbid obesity Body mass index is 47.47 kg/m.  -Could benefit from GLP-1 inhibitors  LEFT buttocks pressure injury Pressure Injury 11/18/20 Buttocks Left Deep Tissue Pressure Injury - Purple or maroon localized area of discolored intact skin or blood-filled blister due to damage of underlying soft tissue from pressure and/or shear. (Active)  11/18/20 0400  Location: Buttocks  Location Orientation: Left  Staging: Deep Tissue Pressure Injury - Purple or maroon localized area of discolored intact skin or blood-filled blister due to damage of underlying soft tissue from pressure and/or shear.  Wound Description (Comments):   Present on Admission: Yes          Discharge Diagnoses:  Principal Problem:   ARF (acute renal failure) (HCC) Active Problems:   Diabetes (HCC)   Elevated CK   Acute pain of left knee   AKI (acute kidney injury) (HCC)   Discharge Condition: Stable  Diet recommendation: Heart healthy/carb modified  Filed Weights   11/17/20 1524  Weight: (!) 158.8 kg    History of present illness:  65 year old WF PMHx  DM-2, HTN, A. fib on Xarelto, diastolic CHF, CKD-3B and morbid obesity    Presenting with generalized weakness, ambulatory dysfunction and left knee pain and admitted for AKI and mild rhabdomyolysis.  Knee and hip imaging without acute finding but degenerative changes due to osteoarthritis.  Apparently, patient has been avoiding to see orthopedic surgery out of fear for total knee replacement.  AKI and rhabdomyolysis seems to have resolved.  Therapy recommended SNF.  Waiting on insurance authorization.  Hospital Course:  See above    Cultures  6/12 SARS coronavirus negative 6/12 influenza A/B negative 6/17 SARS coronavirus negative 6/17 influenza A/B  negative   Antibiotics Anti-infectives (From admission, onward)    None         Discharge Exam: Vitals:   11/23/20 1057 11/23/20 1910 11/23/20 2148 11/24/20 0457  BP: (!) 144/69 109/60 135/88 130/69  Pulse: 75 72 72 77  Resp: 16 18 16 18   Temp: 98.1 F (36.7 C) 98.7 F (37.1 C) 98.2 F (36.8 C) (!) 97.4 F (36.3 C)  TempSrc:  Oral Oral Oral  SpO2: 93% 94% 95% 92%  Weight:      Height:        General: A/O x4, No acute respiratory distress Eyes: negative scleral hemorrhage, negative anisocoria, negative icterus ENT: Negative Runny nose, negative gingival bleeding, Neck:  Negative scars, masses, torticollis, lymphadenopathy, JVD Lungs: Clear to auscultation bilaterally without wheezes or crackles Cardiovascular: Regular rate and rhythm without murmur gallop or rub normal S1 and S2  Discharge Instructions   Allergies as of 11/24/2020       Reactions   Cymbalta [duloxetine Hcl]    "loopiness"   Gabapentin Itching        Medication List     STOP taking these medications    acetaminophen 650 MG CR tablet Commonly known as: TYLENOL Replaced by: acetaminophen 325 MG tablet   cyclobenzaprine 5 MG tablet Commonly known as: FLEXERIL   losartan 50 MG tablet Commonly known as: COZAAR   metFORMIN 1000 MG tablet Commonly known as: GLUCOPHAGE   potassium chloride SA 20 MEQ tablet Commonly known as: KLOR-CON   spironolactone 25 MG tablet Commonly known as: ALDACTONE   Thera-D 2000 50 MCG (2000 UT) Tabs Generic drug: Cholecalciferol   traMADol 50 MG tablet Commonly known as: ULTRAM       TAKE these medications    acetaminophen 325 MG tablet Commonly known as: TYLENOL Take 2 tablets (650 mg total) by mouth every 6 (six) hours as needed for mild pain (or Fever >/= 101). Replaces: acetaminophen 650 MG CR tablet   albuterol 108 (90 Base) MCG/ACT inhaler Commonly known as: VENTOLIN HFA Inhale 2 puffs into the lungs every 6 (six) hours as needed for  wheezing or shortness of breath.   atorvastatin 40 MG tablet Commonly known as: LIPITOR TAKE 1 TABLET(40 MG) BY MOUTH DAILY What changed:  how much to take how to take this when to take this additional instructions   cetirizine 10 MG tablet Commonly known as: ZYRTEC 1 tab by mouth once daily as needed What changed:  how much to take how to take this when to take this additional instructions   diltiazem 360 MG 24 hr capsule Commonly known as: CARDIZEM CD TAKE 1 CAPSULE(360 MG) BY MOUTH DAILY What changed:  how much to take how to take this when to take this additional instructions   empagliflozin 25 MG Tabs tablet Commonly known as: Jardiance TAKE 1 TABLET BY MOUTH DAILY. What changed:  how much to take how to take this when to take this additional instructions   Fish Oil 1000 MG Caps Take 2 capsules (2,000 mg total) by mouth 2 (two) times daily.   furosemide 40 MG tablet Commonly known as: LASIX Take 1 tablet (40 mg total) by mouth daily. Needs appointment for further refills What changed: additional instructions   glipiZIDE 10 MG 24 hr tablet Commonly known as: GLUCOTROL XL 1  tab by mouth once daily What changed:  how much to take how to take this when to take this additional instructions   meclizine 12.5 MG tablet Commonly known as: ANTIVERT TAKE 1 TABLET(12.5 MG) BY MOUTH THREE TIMES DAILY AS NEEDED FOR DIZZINESS What changed: See the new instructions.   metoprolol succinate 100 MG 24 hr tablet Commonly known as: TOPROL-XL Take 1 tablet (100 mg total) by mouth daily. Take with or immediately following a meal.   pioglitazone 30 MG tablet Commonly known as: Actos Take 1 tablet (30 mg total) by mouth daily.   polyethylene glycol 17 g packet Commonly known as: MIRALAX / GLYCOLAX Take 17 g by mouth 2 (two) times daily as needed for mild constipation.   pregabalin 50 MG capsule Commonly known as: LYRICA TAKE 1 CAPSULE(50 MG) BY MOUTH THREE TIMES  DAILY What changed:  how much to take how to take this when to take this additional instructions   rivaroxaban 20 MG Tabs tablet Commonly known as: XARELTO Take 1 tablet (20 mg total) by mouth daily with supper. What changed: See the new instructions.   senna-docusate 8.6-50 MG tablet Commonly known as: Senokot-S Take 1 tablet by mouth 2 (two) times daily as needed for moderate constipation.       Allergies  Allergen Reactions   Cymbalta [Duloxetine Hcl]     "loopiness"   Gabapentin Itching      The results of significant diagnostics from this hospitalization (including imaging, microbiology, ancillary and laboratory) are listed below for reference.    Significant Diagnostic Studies: CT ABDOMEN PELVIS WO CONTRAST  Result Date: 11/18/2020 CLINICAL DATA:  Right lower quadrant abdominal pain EXAM: CT ABDOMEN AND PELVIS WITHOUT CONTRAST TECHNIQUE: Multidetector CT imaging of the abdomen and pelvis was performed following the standard protocol without IV contrast. COMPARISON:  None. FINDINGS: Lower chest: Cardiomegaly.  No acute abnormality. Hepatobiliary: No focal hepatic abnormality. Gallbladder unremarkable. Pancreas: No focal abnormality or ductal dilatation. Spleen: No focal abnormality.  Normal size. Adrenals/Urinary Tract: Prominent soft tissue area in the midpole of the right kidney measures 2.5 cm and cannot be characterized on this noncontrast study. No hydronephrosis. No renal or ureteral stones. Adrenal glands and urinary bladder unremarkable. Stomach/Bowel: Diffuse colonic diverticulosis. No active diverticulitis. Stomach and small bowel decompressed, unremarkable. Vascular/Lymphatic: Aortic atherosclerosis. No evidence of aneurysm or adenopathy. Reproductive: Uterus and adnexa unremarkable.  No mass. Other: No free fluid or free air. Musculoskeletal: No acute bony abnormality. IMPRESSION: Colonic diverticulosis.  No active diverticulitis. Cardiomegaly. Prominent soft tissue  area in the parapelvic midpole of the right kidney, difficult to characterize on this unenhanced study. This could be further evaluated with contrast-enhanced CT or ultrasound. No acute findings in the abdomen or pelvis. Electronically Signed   By: Charlett NoseKevin  Dover M.D.   On: 11/18/2020 01:10   DG Lumbar Spine Complete  Result Date: 11/18/2020 CLINICAL DATA:  Left hip pain radiating to the left knee EXAM: LUMBAR SPINE - COMPLETE 4+ VIEW COMPARISON:  None. FINDINGS: Normal alignment. Diffuse degenerative disc and facet disease. No fracture. SI joints symmetric and unremarkable. IMPRESSION: Diffuse degenerative disc and facet disease. Electronically Signed   By: Charlett NoseKevin  Dover M.D.   On: 11/18/2020 00:17   DG Knee Complete 4 Views Left  Result Date: 11/18/2020 CLINICAL DATA:  Pain EXAM: LEFT KNEE - COMPLETE 4+ VIEW COMPARISON:  None. FINDINGS: Advanced tricompartment degenerative changes most pronounced in the medial and patellofemoral compartments. No joint effusion. No acute bony abnormality. Specifically, no fracture, subluxation,  or dislocation. IMPRESSION: Advanced tricompartment degenerative changes. No acute bony abnormality. Electronically Signed   By: Charlett Nose M.D.   On: 11/18/2020 00:16   DG Hip Unilat W or Wo Pelvis 2-3 Views Left  Result Date: 11/18/2020 CLINICAL DATA:  Left hip pain radiating to left knee. EXAM: DG HIP (WITH OR WITHOUT PELVIS) 2-3V LEFT COMPARISON:  None. FINDINGS: Degenerative changes in the hips bilaterally. No acute bony abnormality. Specifically, no fracture, subluxation, or dislocation. IMPRESSION: No acute bony abnormality. Electronically Signed   By: Charlett Nose M.D.   On: 11/18/2020 00:16    Microbiology: Recent Results (from the past 240 hour(s))  Resp Panel by RT-PCR (Flu A&B, Covid) Nasopharyngeal Swab     Status: None   Collection Time: 11/18/20  2:32 AM   Specimen: Nasopharyngeal Swab; Nasopharyngeal(NP) swabs in vial transport medium  Result Value Ref Range  Status   SARS Coronavirus 2 by RT PCR NEGATIVE NEGATIVE Final    Comment: (NOTE) SARS-CoV-2 target nucleic acids are NOT DETECTED.  The SARS-CoV-2 RNA is generally detectable in upper respiratory specimens during the acute phase of infection. The lowest concentration of SARS-CoV-2 viral copies this assay can detect is 138 copies/mL. A negative result does not preclude SARS-Cov-2 infection and should not be used as the sole basis for treatment or other patient management decisions. A negative result may occur with  improper specimen collection/handling, submission of specimen other than nasopharyngeal swab, presence of viral mutation(s) within the areas targeted by this assay, and inadequate number of viral copies(<138 copies/mL). A negative result must be combined with clinical observations, patient history, and epidemiological information. The expected result is Negative.  Fact Sheet for Patients:  BloggerCourse.com  Fact Sheet for Healthcare Providers:  SeriousBroker.it  This test is no t yet approved or cleared by the [place] FDA and  has been authorized for detection and/or diagnosis of SARS-CoV-2 by FDA under an Emergency Use Authorization (EUA). This EUA will remain  in effect (meaning this test can be used) for the duration of the COVID-19 declaration under Section 564(b)(1) of the Act, 21 U.S.C.section 360bbb-3(b)(1), unless the authorization is terminated  or revoked sooner.       Influenza A by PCR NEGATIVE NEGATIVE Final   Influenza B by PCR NEGATIVE NEGATIVE Final    Comment: (NOTE) The Xpert Xpress SARS-CoV-2/FLU/RSV plus assay is intended as an aid in the diagnosis of influenza from Nasopharyngeal swab specimens and should not be used as a sole basis for treatment. Nasal washings and aspirates are unacceptable for Xpert Xpress SARS-CoV-2/FLU/RSV testing.  Fact Sheet for  Patients: BloggerCourse.com  Fact Sheet for Healthcare Providers: SeriousBroker.it  This test is not yet approved or cleared by the [place] FDA and has been authorized for detection and/or diagnosis of SARS-CoV-2 by FDA under an Emergency Use Authorization (EUA). This EUA will remain in effect (meaning this test can be used) for the duration of the COVID-19 declaration under Section 564(b)(1) of the Act, 21 U.S.C. section 360bbb-3(b)(1), unless the authorization is terminated or revoked.  Performed at  [facility], [address] [place], [place] [address]   Resp Panel by RT-PCR (Flu A&B, Covid) Nasopharyngeal Swab     Status: None   Collection Time: 11/21/20 11:51 AM   Specimen: Nasopharyngeal Swab; Nasopharyngeal(NP) swabs in vial transport medium  Result Value Ref Range Status   SARS Coronavirus 2 by RT PCR NEGATIVE NEGATIVE Final    Comment: (NOTE) SARS-CoV-2 target nucleic acids are NOT DETECTED.  The SARS-CoV-2 RNA is generally detectable in upper respiratory specimens during the acute phase of infection. The lowest concentration of SARS-CoV-2 viral copies this assay can detect is 138 copies/mL. A negative result does not preclude SARS-Cov-2 infection and should not be used as the sole basis for treatment or other patient management decisions. A negative result may occur with  improper specimen collection/handling, submission of specimen other than nasopharyngeal swab, presence of viral mutation(s) within the areas targeted by this assay, and inadequate number of viral copies(<138 copies/mL). A negative result must be combined with clinical observations, patient history, and epidemiological information. The expected result is Negative.  Fact Sheet for Patients:  BloggerCourse.com  Fact Sheet for Healthcare Providers:  SeriousBroker.it  This test is no t yet  approved or cleared by the [place] FDA and  has been authorized for detection and/or diagnosis of SARS-CoV-2 by FDA under an Emergency Use Authorization (EUA). This EUA will remain  in effect (meaning this test can be used) for the duration of the COVID-19 declaration under Section 564(b)(1) of the Act, 21 U.S.C.section 360bbb-3(b)(1), unless the authorization is terminated  or revoked sooner.       Influenza A by PCR NEGATIVE NEGATIVE Final   Influenza B by PCR NEGATIVE NEGATIVE Final    Comment: (NOTE) The Xpert Xpress SARS-CoV-2/FLU/RSV plus assay is intended as an aid in the diagnosis of influenza from Nasopharyngeal swab specimens and should not be used as a sole basis for treatment. Nasal washings and aspirates are unacceptable for Xpert Xpress SARS-CoV-2/FLU/RSV testing.  Fact Sheet for Patients: BloggerCourse.com  Fact Sheet for Healthcare Providers: SeriousBroker.it  This test is not yet approved or cleared by the [place] FDA and has been authorized for detection and/or diagnosis of SARS-CoV-2 by FDA under an Emergency Use Authorization (EUA). This EUA will remain in effect (meaning this test can be used) for the duration of the COVID-19 declaration under Section 564(b)(1) of the Act, 21 U.S.C. section 360bbb-3(b)(1), unless the authorization is terminated or revoked.  Performed at [facility], [address], [place], [place] [address]   Resp Panel by RT-PCR (Flu A&B, Covid) Nasopharyngeal Swab     Status: None   Collection Time: 11/23/20 11:20 AM   Specimen: Nasopharyngeal Swab; Nasopharyngeal(NP) swabs in vial transport medium  Result Value Ref Range Status   SARS Coronavirus 2 by RT PCR NEGATIVE NEGATIVE Final    Comment: (NOTE) SARS-CoV-2 target nucleic acids are NOT DETECTED.  The SARS-CoV-2 RNA is generally detectable in upper respiratory specimens during the acute phase of infection. The  lowest concentration of SARS-CoV-2 viral copies this assay can detect is 138 copies/mL. A negative result does not preclude SARS-Cov-2 infection and should not be used as the sole basis for treatment or other patient management decisions. A negative result may occur with  improper specimen collection/handling, submission of specimen other than nasopharyngeal swab, presence of viral mutation(s) within the areas targeted by this assay, and inadequate number of viral copies(<138 copies/mL). A negative result must be combined with clinical observations, patient history, and epidemiological information. The expected result is Negative.  Fact Sheet for Patients:  BloggerCourse.com  Fact Sheet for Healthcare Providers:  SeriousBroker.it  This test is no t yet approved or cleared by the [place] FDA and  has been authorized for detection and/or diagnosis of SARS-CoV-2 by FDA under an Emergency Use Authorization (EUA). This EUA will remain  in effect (meaning this test can be used) for the duration of  the COVID-19 declaration under Section 564(b)(1) of the Act, 21 U.S.C.section 360bbb-3(b)(1), unless the authorization is terminated  or revoked sooner.       Influenza A by PCR NEGATIVE NEGATIVE Final   Influenza B by PCR NEGATIVE NEGATIVE Final    Comment: (NOTE) The Xpert Xpress SARS-CoV-2/FLU/RSV plus assay is intended as an aid in the diagnosis of influenza from Nasopharyngeal swab specimens and should not be used as a sole basis for treatment. Nasal washings and aspirates are unacceptable for Xpert Xpress SARS-CoV-2/FLU/RSV testing.  Fact Sheet for Patients: BloggerCourse.com  Fact Sheet for Healthcare Providers: SeriousBroker.it  This test is not yet approved or cleared by the [place] FDA and has been authorized for detection and/or diagnosis of SARS-CoV-2 by FDA under  an Emergency Use Authorization (EUA). This EUA will remain in effect (meaning this test can be used) for the duration of the COVID-19 declaration under Section 564(b)(1) of the Act, 21 U.S.C. section 360bbb-3(b)(1), unless the authorization is terminated or revoked.  Performed at [facility], [address], [place], [place] [address]      Labs: Basic Metabolic Panel: Recent Labs  Lab 11/19/20 0239 11/20/20 0251 11/22/20 0239 11/23/20 0259 11/24/20 0347  NA 138 136 138 138 138  K 3.6 3.6 3.5 3.5 3.5  CL 107 104 101 98 99  CO2 21* GLUCOSE 154* 142* 185* 206* 194*  BUN 32* CREATININE 1.83* 1.58* 1.56* 1.53* 1.51*  CALCIUM 8.5* 8.5* 9.2 9.4 9.1  MG  --   --  1.8 1.8 1.7  PHOS  --   --  2.9 3.3 3.4   Liver Function Tests: Recent Labs  Lab 11/18/20 0548 11/22/20 0239 11/23/20 0259 11/24/20 0347  AST 27  --  13* 18  ALT 21  --  18 18  ALKPHOS 53  --  64 67  BILITOT 1.2  --  0.5 0.8  PROT 6.5  --  6.4* 6.5  ALBUMIN 3.2* 3.0* 3.1* 3.1*   No results for input(s): LIPASE, AMYLASE in the last 168 hours. No results for input(s): AMMONIA in the last 168 hours. CBC: Recent Labs  Lab 11/17/20 1526 11/18/20 0548 11/19/20 0239 11/22/20 0239 11/23/20 0259 11/24/20 0347  WBC 13.1* 11.1* 8.8 10.2 9.7 10.4  NEUTROABS 10.9* 8.8* 6.2  --   --   --   HGB 11.9* 11.1* 10.6* 11.3* 11.8* 12.4  HCT 38.9 36.5 34.2* 36.3 38.5 39.6  MCV 84.6 84.7 84.7 84.0 84.4 82.3  PLT 299 264 227 225 243 239   Cardiac Enzymes: Recent Labs  Lab 11/17/20 2332 11/18/20 0548 11/19/20 0239 11/22/20 0239  CKTOTAL 787* 668* 303* 76   BNP: BNP (last 3 results) No results for input(s): BNP in the last 8760 hours.  ProBNP (last 3 results) Recent Labs    03/20/20 1145 06/21/20 1129  PROBNP 125.0* 139.0*    CBG: Recent Labs  Lab 11/23/20 0650 11/23/20 1200 11/23/20 1724 11/23/20 2142 11/24/20 0731  GLUCAP 204* 202* 241* 168* 174*        Signed:  Carolyne Littles, MD [facility]

## 2020-11-23 NOTE — Progress Notes (Signed)
Occupational Therapy Treatment Patient Details Name: Pamela [name] MRN: [id number] DOB: 1956-04-03 Today's Date: 11/23/2020    History of present illness Pt is a 65 y/o female admitted secondary to difficulty walking and unable to get up from the commode at home.  Patient had to sit on the commode for many hours following which patient had to roll onto the floor to reach the phone and called her brother.  Patient was having significant pain of the left knee.  She has not had any food for almost 48 hours and has not taken her medications. PMH including but not limited to DM, HTN, A. fib, diastolic CHF, CKD.   OT comments  Pt making progress with functional goals. Session focused on sitting EOB, grooming/UB ADL tasks seated EOB, sit - stand for EOB with RW x attempts, squat standing for RN to place sacral pad, activity tolerance, rolling for perihygiene and scooting self to [facility] using rails. Pt reports being fatigued today and not eating much but was agreeable to activity. OT will continue to follow acutely to maximize level of function and safety  Follow Up Recommendations  SNF    Equipment Recommendations  Other (comment) (TBD at SNF)    Recommendations for Other Services      Precautions / Restrictions Precautions Precautions: Fall Precaution Comments: L knee pain Restrictions Weight Bearing Restrictions: No       Mobility Bed Mobility Overal bed mobility: Needs Assistance Bed Mobility: Supine to Sit;Rolling;Sit to Supine Rolling: Supervision   Supine to sit: Supervision Sit to supine: Supervision   General bed mobility comments: assist for safety, increased time to initiate/perform and use of bed rail/features    Transfers Overall transfer level: Needs assistance Equipment used: Rolling walker (2 wheeled) Transfers: Sit to/from Stand;Lateral/Scoot Transfers Sit to Stand: Mod assist;+2 physical assistance;+2 safety/equipment;From elevated surface        Lateral/Scoot  Transfers: Min guard General transfer comment: Attempted x2 to stand from bed with walker. Unable to stand with +2 mod assist due to L knee pain. On best attempt able to stand at [facility] with trunk significantly flexed (while RN replaced sacral foam dressing) <10 seconds; pt encouraged to use rocking for momentum and LLE advanced. Tried scooting on EOB with pt able to scoot ~1.5 foot with min guard.    Balance Overall balance assessment: Needs assistance Sitting-balance support: Feet supported Sitting balance-[name]: Fair Sitting balance - Comments: static sitting and weight shifting supervision at most no LOB   Standing balance support: Bilateral upper extremity supported Standing balance-[name]: Poor Standing balance comment: pain limiting, pt needing +2 modA to remain upright up to 10 seconds at a time.                           ADL either performed or assessed with clinical judgement   ADL Overall ADL's : Needs assistance/impaired     Grooming: Wash/dry hands;Wash/dry face;Brushing hair;Min guard;Sitting           Upper Body Dressing : Min Doctor, hospital Details (indicate cue type and reason): attmpted sit - stand x 2 from EOB mod A +2 Toileting- Clothing Manipulation and Hygiene: Total assistance;Bed level       Functional mobility during ADLs: Maximal assistance;Moderate assistance;+2 for physical assistance       Vision Baseline Vision/History: Wears glasses Patient Visual Report: No change from baseline     Perception  Praxis      Cognition Arousal/Alertness: Awake/alert Behavior During Therapy: WFL for tasks assessed/performed Overall Cognitive Status: Within Functional Limits for tasks assessed                                 General Comments: appears depressed and at times tearful but participatory as able        Exercises Exercises: Other exercises Other Exercises Other Exercises: reviewed  supine warm-up exercises AP, QS, GS x5 reps ea encouraged her to perform 4x/day at least 10 reps   Shoulder Instructions       General Comments HR 69 bpm with sitting, pt w/o acute s/sx distress during session and VSS per chart review not further assessed.    Pertinent Vitals/ Pain       Pain Assessment: Faces Faces Pain Scale: Hurts even more Pain Location: L knee>hip with mobility Pain Descriptors / Indicators: Grimacing;Guarding;Discomfort Pain Intervention(s): Limited activity within patient's tolerance;Monitored during session;Repositioned  Home Living                                          Prior Functioning/Environment              Frequency  Min 2X/week        Progress Toward Goals  OT Goals(current goals can now be found in the care plan section)  Progress towards OT goals: Progressing toward goals  Acute Rehab OT Goals Patient Stated Goal: decrease pain  Plan Discharge plan remains appropriate    Co-evaluation    PT/OT/SLP Co-Evaluation/Treatment: Yes Reason for Co-Treatment: For patient/therapist safety;To address functional/ADL transfers PT goals addressed during session: Mobility/safety with mobility;Balance;Proper use of DME;Strengthening/ROM OT goals addressed during session: ADL's and self-care;Proper use of Adaptive equipment and DME      AM-PAC OT "6 Clicks" Daily Activity     Outcome Measure   Help from another person eating meals?: None Help from another person taking care of personal grooming?: A Little Help from another person toileting, which includes using toliet, bedpan, or urinal?: Total Help from another person bathing (including washing, rinsing, drying)?: A Lot Help from another person to put on and taking off regular upper body clothing?: A Little Help from another person to put on and taking off regular lower body clothing?: Total 6 Click Score: 14    End of Session Equipment Utilized During Treatment:  Gait belt;Rolling walker  OT Visit Diagnosis: Other abnormalities of gait and mobility (R26.89);History of falling (Z91.81);Muscle weakness (generalized) (M62.81);Pain Pain - Right/Left:  (bilaterally) Pain - part of body: Knee   Activity Tolerance Patient limited by fatigue   Patient Left in bed;with call bell/phone within reach   Nurse Communication          Time: 5361-4431 OT Time Calculation (min): 27 min  Charges: OT General Charges $OT Visit: 1 Visit OT Treatments $Therapeutic Activity: 8-22 mins    Galen Manila 11/23/2020, 3:20 PM

## 2020-11-24 DIAGNOSIS — I10 Essential (primary) hypertension: Secondary | ICD-10-CM

## 2020-11-24 DIAGNOSIS — M6282 Rhabdomyolysis: Secondary | ICD-10-CM | POA: Diagnosis present

## 2020-11-24 DIAGNOSIS — I5032 Chronic diastolic (congestive) heart failure: Secondary | ICD-10-CM | POA: Diagnosis present

## 2020-11-24 DIAGNOSIS — E1165 Type 2 diabetes mellitus with hyperglycemia: Secondary | ICD-10-CM | POA: Diagnosis present

## 2020-11-24 DIAGNOSIS — I482 Chronic atrial fibrillation, unspecified: Secondary | ICD-10-CM

## 2020-11-24 LAB — CBC
HCT: 39.6 % (ref 36.0–46.0)
Hemoglobin: 12.4 g/dL (ref 12.0–15.0)
MCH: 25.8 pg — ABNORMAL LOW (ref 26.0–34.0)
MCHC: 31.3 g/dL (ref 30.0–36.0)
MCV: 82.3 fL (ref 80.0–100.0)
Platelets: 239 10*3/uL (ref 150–400)
RBC: 4.81 MIL/uL (ref 3.87–5.11)
RDW: 15.8 % — ABNORMAL HIGH (ref 11.5–15.5)
WBC: 10.4 10*3/uL (ref 4.0–10.5)
nRBC: 0 % (ref 0.0–0.2)

## 2020-11-24 LAB — COMPREHENSIVE METABOLIC PANEL
ALT: 18 U/L (ref 0–44)
AST: 18 U/L (ref 15–41)
Albumin: 3.1 g/dL — ABNORMAL LOW (ref 3.5–5.0)
Alkaline Phosphatase: 67 U/L (ref 38–126)
Anion gap: 11 (ref 5–15)
BUN: 21 mg/dL (ref 8–23)
CO2: 28 mmol/L (ref 22–32)
Calcium: 9.1 mg/dL (ref 8.9–10.3)
Chloride: 99 mmol/L (ref 98–111)
Creatinine, Ser: 1.51 mg/dL — ABNORMAL HIGH (ref 0.44–1.00)
GFR, Estimated: 38 mL/min — ABNORMAL LOW (ref >60)
Glucose, Bld: 194 mg/dL — ABNORMAL HIGH (ref 70–99)
Potassium: 3.5 mmol/L (ref 3.5–5.1)
Sodium: 138 mmol/L (ref 135–145)
Total Bilirubin: 0.8 mg/dL (ref 0.3–1.2)
Total Protein: 6.5 g/dL (ref 6.5–8.1)

## 2020-11-24 LAB — PHOSPHORUS: Phosphorus: 3.4 mg/dL (ref 2.5–4.6)

## 2020-11-24 LAB — GLUCOSE, CAPILLARY: Glucose-Capillary: 174 mg/dL — ABNORMAL HIGH (ref 70–99)

## 2020-11-24 LAB — RESP PANEL BY RT-PCR (FLU A&B, COVID) ARPGX2
Influenza A by PCR: NEGATIVE
Influenza B by PCR: NEGATIVE
SARS Coronavirus 2 by RT PCR: NEGATIVE

## 2020-11-24 LAB — MAGNESIUM: Magnesium: 1.7 mg/dL (ref 1.7–2.4)

## 2020-11-24 MED ORDER — ACETAMINOPHEN 325 MG PO TABS
650.0000 mg | ORAL_TABLET | Freq: Four times a day (QID) | ORAL | 0 refills | Status: AC | PRN
Start: 2020-11-24 — End: ?

## 2020-11-24 MED ORDER — RIVAROXABAN 20 MG PO TABS: 20.0000 mg | ORAL_TABLET | Freq: Every day | ORAL | 0 refills | Status: DC

## 2020-11-24 MED ORDER — POLYETHYLENE GLYCOL 3350 17 G PO PACK: 17.0000 g | PACK | Freq: Two times a day (BID) | ORAL | 0 refills | Status: DC | PRN

## 2020-11-24 MED ORDER — SENNOSIDES-DOCUSATE SODIUM 8.6-50 MG PO TABS: 1.0000 | ORAL_TABLET | Freq: Two times a day (BID) | ORAL | 0 refills | Status: AC | PRN

## 2020-11-24 NOTE — Plan of Care (Signed)
  Problem: Clinical Measurements: Goal: Diagnostic test results will improve Outcome: Completed/Met   Problem: Activity: Goal: Risk for activity intolerance will decrease Outcome: Completed/Met   Problem: Safety: Goal: Ability to remain free from injury will improve Outcome: Completed/Met   Problem: Skin Integrity: Goal: Risk for impaired skin integrity will decrease Outcome: Progressing   Problem: Education: Goal: Knowledge of General Education information will improve Description: Including pain rating scale, medication(s)/side effects and non-pharmacologic comfort measures Outcome: Progressing   Problem: Clinical Measurements: Goal: Ability to maintain clinical measurements within normal limits will improve Outcome: Completed/Met Goal: Will remain free from infection Outcome: Completed/Met Goal: Diagnostic test results will improve Outcome: Progressing Goal: Respiratory complications will improve Outcome: Completed/Met Goal: Cardiovascular complication will be avoided Outcome: Completed/Met   Problem: Clinical Measurements: Goal: Respiratory complications will improve Outcome: Completed/Met   Problem: Activity: Goal: Risk for activity intolerance will decrease Outcome: Progressing   Problem: Coping: Goal: Level of anxiety will decrease Outcome: Progressing   Problem: Elimination: Goal: Will not experience complications related to bowel motility Outcome: Progressing Goal: Will not experience complications related to urinary retention Outcome: Progressing   Problem: Pain Managment: Goal: General experience of comfort will improve Outcome: Progressing   Problem: Skin Integrity: Goal: Risk for impaired skin integrity will decrease Outcome: Progressing

## 2020-11-24 NOTE — Discharge Summary (Signed)
Physician Discharge Summary  Pamela [name] CBJ:[id number] DOB: May 21, 1956 DOA: 11/17/2020  PCP: Corwin Levins, MD  Admit date: 11/17/2020 Discharge date: 11/24/2020  Time spent: 35 minutes  Recommendations for Outpatient Follow-up:   Covid vaccination; vaccinated 2/3   AKI on CKD-3B/Azotemia:  Likely from rhabdo, diuretics and ARB.  She is on Lasix, Aldactone and losartan at home.  AKI and azotemia resolved. Lab Results  Component Value Date   CREATININE 1.51 (H) 11/24/2020   CREATININE 1.53 (H) 11/23/2020   CREATININE 1.56 (H) 11/22/2020   CREATININE 1.58 (H) 11/20/2020   CREATININE 1.83 (H) 11/19/2020  -Continue monitoring -Resume home Lasix. -Continue holding Aldactone and losartan.   Chronic diastolic CHF:  -Echocardiogram  2019 with LVEF of 65 to 70%, and indeterminate DD due to A. fib.  Has bilateral lower extremity edema but no significant cardiopulmonary symptoms.   -Resume home Lasix -Continue holding Aldactone and losartan -I would avoid/discontinue Actos on discharge. -Monitor fluid status, renal function and electrolytes.   Mild rhabdomyolysis: Resolved.   Controlled NIDDM-2 with hyperglycemia:  -6/12 hemoglobin A1c = 6.9%   Lab Results  Component Value Date   GLUCAP 174 (H) 11/24/2020   GLUCAP 168 (H) 11/23/2020   GLUCAP 241 (H) 11/23/2020   GLUCAP 202 (H) 11/23/2020   GLUCAP 204 (H) 11/23/2020   Continue current insulin regimen while in-house -Recommend discontinuing Actos and reducing metformin and Jardiance on discharge -Start statin if no statin intolerance.   Debility/generalized weakness/ambulatory dysfunction due to left knee pain/osteoarthritis -Has been avoiding Ortho visit out of fear for TKA. -Needs to follow-up with her orthopedic surgeon.   -Continue therapy.  SNF recommended.     Permanent atrial fibrillation: Rate controlled on Cardizem and metoprolol. -Continue home Cardizem, metoprolol and Xarelto -Cardizem is not a great choice  given his CHF but defer to her cardiologist.   Essential hypertension: Normotensive.   -Continue Cardizem and metoprolol  -Losartan and Aldactone on hold in the setting of AKI   Morbid obesity Body mass index is 47.47 kg/m.  -Could benefit from GLP-1 inhibitors  LEFT buttocks pressure injury Pressure Injury 11/18/20 Buttocks Left Deep Tissue Pressure Injury - Purple or maroon localized area of discolored intact skin or blood-filled blister due to damage of underlying soft tissue from pressure and/or shear. (Active)  11/18/20 0400  Location: Buttocks  Location Orientation: Left  Staging: Deep Tissue Pressure Injury - Purple or maroon localized area of discolored intact skin or blood-filled blister due to damage of underlying soft tissue from pressure and/or shear.  Wound Description (Comments):   Present on Admission: Yes          Discharge Diagnoses:  Principal Problem:   ARF (acute renal failure) (HCC) Active Problems:   Diabetes (HCC)   Essential hypertension   Chronic atrial fibrillation (HCC)   Obesity, Class III, BMI 40-49.9 (morbid obesity) (HCC)   Elevated CK   Acute pain of left knee   AKI (acute kidney injury) (HCC)   Chronic diastolic CHF (congestive heart failure) (HCC)   Non-traumatic rhabdomyolysis   Controlled type 2 diabetes mellitus with hyperglycemia (HCC)   Discharge Condition: Stable  Diet recommendation: Heart healthy/carb modified  Filed Weights   11/17/20 1524  Weight: (!) 158.8 kg    History of present illness:  65 year old WF PMHx  DM-2, HTN, A. fib on Xarelto, diastolic CHF, CKD-3B and morbid obesity    Presenting with generalized weakness, ambulatory dysfunction and left knee pain and admitted for AKI and mild rhabdomyolysis.  Knee and hip imaging without acute finding but degenerative changes due to osteoarthritis.  Apparently, patient has been avoiding to see orthopedic surgery out of fear for total knee replacement.  AKI and  rhabdomyolysis seems to have resolved.  Therapy recommended SNF.  Waiting on insurance authorization.  Hospital Course:  See above    Cultures  6/12 SARS coronavirus negative 6/12 influenza A/B negative 6/17 SARS coronavirus negative 6/17 influenza A/B negative   Antibiotics Anti-infectives (From admission, onward)    None         Discharge Exam: Vitals:   11/23/20 1057 11/23/20 1910 11/23/20 2148 11/24/20 0457  BP: (!) 144/69 109/60 135/88 130/69  Pulse: 75 72 72 77  Resp: 16 18 16 18   Temp: 98.1 F (36.7 C) 98.7 F (37.1 C) 98.2 F (36.8 C) (!) 97.4 F (36.3 C)  TempSrc:  Oral Oral Oral  SpO2: 93% 94% 95% 92%  Weight:      Height:        General: A/O x4, No acute respiratory distress Eyes: negative scleral hemorrhage, negative anisocoria, negative icterus ENT: Negative Runny nose, negative gingival bleeding, Neck:  Negative scars, masses, torticollis, lymphadenopathy, JVD Lungs: Clear to auscultation bilaterally without wheezes or crackles Cardiovascular: Regular rate and rhythm without murmur gallop or rub normal S1 and S2  Discharge Instructions   Allergies as of 11/24/2020       Reactions   Cymbalta [duloxetine Hcl]    "loopiness"   Gabapentin Itching        Medication List     STOP taking these medications    acetaminophen 650 MG CR tablet Commonly known as: TYLENOL Replaced by: acetaminophen 325 MG tablet   cyclobenzaprine 5 MG tablet Commonly known as: FLEXERIL   losartan 50 MG tablet Commonly known as: COZAAR   metFORMIN 1000 MG tablet Commonly known as: GLUCOPHAGE   potassium chloride SA 20 MEQ tablet Commonly known as: KLOR-CON   spironolactone 25 MG tablet Commonly known as: ALDACTONE   Thera-D 2000 50 MCG (2000 UT) Tabs Generic drug: Cholecalciferol   traMADol 50 MG tablet Commonly known as: ULTRAM       TAKE these medications    acetaminophen 325 MG tablet Commonly known as: TYLENOL Take 2 tablets (650 mg  total) by mouth every 6 (six) hours as needed for mild pain (or Fever >/= 101). Replaces: acetaminophen 650 MG CR tablet   albuterol 108 (90 Base) MCG/ACT inhaler Commonly known as: VENTOLIN HFA Inhale 2 puffs into the lungs every 6 (six) hours as needed for wheezing or shortness of breath.   atorvastatin 40 MG tablet Commonly known as: LIPITOR TAKE 1 TABLET(40 MG) BY MOUTH DAILY What changed:  how much to take how to take this when to take this additional instructions   cetirizine 10 MG tablet Commonly known as: ZYRTEC 1 tab by mouth once daily as needed What changed:  how much to take how to take this when to take this additional instructions   diltiazem 360 MG 24 hr capsule Commonly known as: CARDIZEM CD TAKE 1 CAPSULE(360 MG) BY MOUTH DAILY What changed:  how much to take how to take this when to take this additional instructions   empagliflozin 25 MG Tabs tablet Commonly known as: Jardiance TAKE 1 TABLET BY MOUTH DAILY. What changed:  how much to take how to take this when to take this additional instructions   Fish Oil 1000 MG Caps Take 2 capsules (2,000 mg total) by mouth 2 (two) times  daily.   furosemide 40 MG tablet Commonly known as: LASIX Take 1 tablet (40 mg total) by mouth daily. Needs appointment for further refills What changed: additional instructions   glipiZIDE 10 MG 24 hr tablet Commonly known as: GLUCOTROL XL 1 tab by mouth once daily What changed:  how much to take how to take this when to take this additional instructions   meclizine 12.5 MG tablet Commonly known as: ANTIVERT TAKE 1 TABLET(12.5 MG) BY MOUTH THREE TIMES DAILY AS NEEDED FOR DIZZINESS What changed: See the new instructions.   metoprolol succinate 100 MG 24 hr tablet Commonly known as: TOPROL-XL Take 1 tablet (100 mg total) by mouth daily. Take with or immediately following a meal.   pioglitazone 30 MG tablet Commonly known as: Actos Take 1 tablet (30 mg total)  by mouth daily.   polyethylene glycol 17 g packet Commonly known as: MIRALAX / GLYCOLAX Take 17 g by mouth 2 (two) times daily as needed for mild constipation.   pregabalin 50 MG capsule Commonly known as: LYRICA TAKE 1 CAPSULE(50 MG) BY MOUTH THREE TIMES DAILY What changed:  how much to take how to take this when to take this additional instructions   rivaroxaban 20 MG Tabs tablet Commonly known as: XARELTO Take 1 tablet (20 mg total) by mouth daily with supper. What changed: See the new instructions.   senna-docusate 8.6-50 MG tablet Commonly known as: Senokot-S Take 1 tablet by mouth 2 (two) times daily as needed for moderate constipation.       Allergies  Allergen Reactions   Cymbalta [Duloxetine Hcl]     "loopiness"   Gabapentin Itching      The results of significant diagnostics from this hospitalization (including imaging, microbiology, ancillary and laboratory) are listed below for reference.    Significant Diagnostic Studies: CT ABDOMEN PELVIS WO CONTRAST  Result Date: 11/18/2020 CLINICAL DATA:  Right lower quadrant abdominal pain EXAM: CT ABDOMEN AND PELVIS WITHOUT CONTRAST TECHNIQUE: Multidetector CT imaging of the abdomen and pelvis was performed following the standard protocol without IV contrast. COMPARISON:  None. FINDINGS: Lower chest: Cardiomegaly.  No acute abnormality. Hepatobiliary: No focal hepatic abnormality. Gallbladder unremarkable. Pancreas: No focal abnormality or ductal dilatation. Spleen: No focal abnormality.  Normal size. Adrenals/Urinary Tract: Prominent soft tissue area in the midpole of the right kidney measures 2.5 cm and cannot be characterized on this noncontrast study. No hydronephrosis. No renal or ureteral stones. Adrenal glands and urinary bladder unremarkable. Stomach/Bowel: Diffuse colonic diverticulosis. No active diverticulitis. Stomach and small bowel decompressed, unremarkable. Vascular/Lymphatic: Aortic atherosclerosis. No  evidence of aneurysm or adenopathy. Reproductive: Uterus and adnexa unremarkable.  No mass. Other: No free fluid or free air. Musculoskeletal: No acute bony abnormality. IMPRESSION: Colonic diverticulosis.  No active diverticulitis. Cardiomegaly. Prominent soft tissue area in the parapelvic midpole of the right kidney, difficult to characterize on this unenhanced study. This could be further evaluated with contrast-enhanced CT or ultrasound. No acute findings in the abdomen or pelvis. Electronically Signed   By: Charlett Nose M.D.   On: 11/18/2020 01:10   DG Lumbar Spine Complete  Result Date: 11/18/2020 CLINICAL DATA:  Left hip pain radiating to the left knee EXAM: LUMBAR SPINE - COMPLETE 4+ VIEW COMPARISON:  None. FINDINGS: Normal alignment. Diffuse degenerative disc and facet disease. No fracture. SI joints symmetric and unremarkable. IMPRESSION: Diffuse degenerative disc and facet disease. Electronically Signed   By: Charlett Nose M.D.   On: 11/18/2020 00:17   DG Knee Complete 4 Views  Left  Result Date: 11/18/2020 CLINICAL DATA:  Pain EXAM: LEFT KNEE - COMPLETE 4+ VIEW COMPARISON:  None. FINDINGS: Advanced tricompartment degenerative changes most pronounced in the medial and patellofemoral compartments. No joint effusion. No acute bony abnormality. Specifically, no fracture, subluxation, or dislocation. IMPRESSION: Advanced tricompartment degenerative changes. No acute bony abnormality. Electronically Signed   By: Charlett NoseKevin  Dover M.D.   On: 11/18/2020 00:16   DG Hip Unilat W or Wo Pelvis 2-3 Views Left  Result Date: 11/18/2020 CLINICAL DATA:  Left hip pain radiating to left knee. EXAM: DG HIP (WITH OR WITHOUT PELVIS) 2-3V LEFT COMPARISON:  None. FINDINGS: Degenerative changes in the hips bilaterally. No acute bony abnormality. Specifically, no fracture, subluxation, or dislocation. IMPRESSION: No acute bony abnormality. Electronically Signed   By: Charlett NoseKevin  Dover M.D.   On: 11/18/2020 00:16     Microbiology: Recent Results (from the past 240 hour(s))  Resp Panel by RT-PCR (Flu A&B, Covid) Nasopharyngeal Swab     Status: None   Collection Time: 11/18/20  2:32 AM   Specimen: Nasopharyngeal Swab; Nasopharyngeal(NP) swabs in vial transport medium  Result Value Ref Range Status   SARS Coronavirus 2 by RT PCR NEGATIVE NEGATIVE Final    Comment: (NOTE) SARS-CoV-2 target nucleic acids are NOT DETECTED.  The SARS-CoV-2 RNA is generally detectable in upper respiratory specimens during the acute phase of infection. The lowest concentration of SARS-CoV-2 viral copies this assay can detect is 138 copies/mL. A negative result does not preclude SARS-Cov-2 infection and should not be used as the sole basis for treatment or other patient management decisions. A negative result may occur with  improper specimen collection/handling, submission of specimen other than nasopharyngeal swab, presence of viral mutation(s) within the areas targeted by this assay, and inadequate number of viral copies(<138 copies/mL). A negative result must be combined with clinical observations, patient history, and epidemiological information. The expected result is Negative.  Fact Sheet for Patients:  BloggerCourse.comhttps://www.fda.gov/media/152166/download  Fact Sheet for Healthcare Providers:  SeriousBroker.ithttps://www.fda.gov/media/152162/download  This test is no t yet approved or cleared by the [place] FDA and  has been authorized for detection and/or diagnosis of SARS-CoV-2 by FDA under an Emergency Use Authorization (EUA). This EUA will remain  in effect (meaning this test can be used) for the duration of the COVID-19 declaration under Section 564(b)(1) of the Act, 21 U.S.C.section 360bbb-3(b)(1), unless the authorization is terminated  or revoked sooner.       Influenza A by PCR NEGATIVE NEGATIVE Final   Influenza B by PCR NEGATIVE NEGATIVE Final    Comment: (NOTE) The Xpert Xpress SARS-CoV-2/FLU/RSV plus assay is  intended as an aid in the diagnosis of influenza from Nasopharyngeal swab specimens and should not be used as a sole basis for treatment. Nasal washings and aspirates are unacceptable for Xpert Xpress SARS-CoV-2/FLU/RSV testing.  Fact Sheet for Patients: BloggerCourse.comhttps://www.fda.gov/media/152166/download  Fact Sheet for Healthcare Providers: SeriousBroker.ithttps://www.fda.gov/media/152162/download  This test is not yet approved or cleared by the [place] FDA and has been authorized for detection and/or diagnosis of SARS-CoV-2 by FDA under an Emergency Use Authorization (EUA). This EUA will remain in effect (meaning this test can be used) for the duration of the COVID-19 declaration under Section 564(b)(1) of the Act, 21 U.S.C. section 360bbb-3(b)(1), unless the authorization is terminated or revoked.  Performed at [facility], [address] [place], [place] [address]   Resp Panel by RT-PCR (Flu A&B, Covid) Nasopharyngeal Swab     Status: None   Collection Time:  11/21/20 11:51 AM   Specimen: Nasopharyngeal Swab; Nasopharyngeal(NP) swabs in vial transport medium  Result Value Ref Range Status   SARS Coronavirus 2 by RT PCR NEGATIVE NEGATIVE Final    Comment: (NOTE) SARS-CoV-2 target nucleic acids are NOT DETECTED.  The SARS-CoV-2 RNA is generally detectable in upper respiratory specimens during the acute phase of infection. The lowest concentration of SARS-CoV-2 viral copies this assay can detect is 138 copies/mL. A negative result does not preclude SARS-Cov-2 infection and should not be used as the sole basis for treatment or other patient management decisions. A negative result may occur with  improper specimen collection/handling, submission of specimen other than nasopharyngeal swab, presence of viral mutation(s) within the areas targeted by this assay, and inadequate number of viral copies(<138 copies/mL). A negative result must be combined with clinical observations, patient  history, and epidemiological information. The expected result is Negative.  Fact Sheet for Patients:  BloggerCourse.com  Fact Sheet for Healthcare Providers:  SeriousBroker.it  This test is no t yet approved or cleared by the [place] FDA and  has been authorized for detection and/or diagnosis of SARS-CoV-2 by FDA under an Emergency Use Authorization (EUA). This EUA will remain  in effect (meaning this test can be used) for the duration of the COVID-19 declaration under Section 564(b)(1) of the Act, 21 U.S.C.section 360bbb-3(b)(1), unless the authorization is terminated  or revoked sooner.       Influenza A by PCR NEGATIVE NEGATIVE Final   Influenza B by PCR NEGATIVE NEGATIVE Final    Comment: (NOTE) The Xpert Xpress SARS-CoV-2/FLU/RSV plus assay is intended as an aid in the diagnosis of influenza from Nasopharyngeal swab specimens and should not be used as a sole basis for treatment. Nasal washings and aspirates are unacceptable for Xpert Xpress SARS-CoV-2/FLU/RSV testing.  Fact Sheet for Patients: BloggerCourse.com  Fact Sheet for Healthcare Providers: SeriousBroker.it  This test is not yet approved or cleared by the [place] FDA and has been authorized for detection and/or diagnosis of SARS-CoV-2 by FDA under an Emergency Use Authorization (EUA). This EUA will remain in effect (meaning this test can be used) for the duration of the COVID-19 declaration under Section 564(b)(1) of the Act, 21 U.S.C. section 360bbb-3(b)(1), unless the authorization is terminated or revoked.  Performed at [facility], [address], [place], [place] [address]   Resp Panel by RT-PCR (Flu A&B, Covid) Nasopharyngeal Swab     Status: None   Collection Time: 11/23/20 11:20 AM   Specimen: Nasopharyngeal Swab; Nasopharyngeal(NP) swabs in vial transport medium  Result Value Ref  Range Status   SARS Coronavirus 2 by RT PCR NEGATIVE NEGATIVE Final    Comment: (NOTE) SARS-CoV-2 target nucleic acids are NOT DETECTED.  The SARS-CoV-2 RNA is generally detectable in upper respiratory specimens during the acute phase of infection. The lowest concentration of SARS-CoV-2 viral copies this assay can detect is 138 copies/mL. A negative result does not preclude SARS-Cov-2 infection and should not be used as the sole basis for treatment or other patient management decisions. A negative result may occur with  improper specimen collection/handling, submission of specimen other than nasopharyngeal swab, presence of viral mutation(s) within the areas targeted by this assay, and inadequate number of viral copies(<138 copies/mL). A negative result must be combined with clinical observations, patient history, and epidemiological information. The expected result is Negative.  Fact Sheet for Patients:  BloggerCourse.com  Fact Sheet for Healthcare Providers:  SeriousBroker.it  This test is no t yet  approved or cleared by the [place] and  has been authorized for detection and/or diagnosis of SARS-CoV-2 by FDA under an Emergency Use Authorization (EUA). This EUA will remain  in effect (meaning this test can be used) for the duration of the COVID-19 declaration under Section 564(b)(1) of the Act, 21 U.S.C.section 360bbb-3(b)(1), unless the authorization is terminated  or revoked sooner.       Influenza A by PCR NEGATIVE NEGATIVE Final   Influenza B by PCR NEGATIVE NEGATIVE Final    Comment: (NOTE) The Xpert Xpress SARS-CoV-2/FLU/RSV plus assay is intended as an aid in the diagnosis of influenza from Nasopharyngeal swab specimens and should not be used as a sole basis for treatment. Nasal washings and aspirates are unacceptable for Xpert Xpress SARS-CoV-2/FLU/RSV testing.  Fact Sheet for  Patients: BloggerCourse.com  Fact Sheet for Healthcare Providers: SeriousBroker.it  This test is not yet approved or cleared by the [place] FDA and has been authorized for detection and/or diagnosis of SARS-CoV-2 by FDA under an Emergency Use Authorization (EUA). This EUA will remain in effect (meaning this test can be used) for the duration of the COVID-19 declaration under Section 564(b)(1) of the Act, 21 U.S.C. section 360bbb-3(b)(1), unless the authorization is terminated or revoked.  Performed at [facility], [address], [place], [place] [address]      Labs: Basic Metabolic Panel: Recent Labs  Lab 11/19/20 0239 11/20/20 0251 11/22/20 0239 11/23/20 0259 11/24/20 0347  NA 138 136 138 138 138  K 3.6 3.6 3.5 3.5 3.5  CL 107 104 101 98 99  CO2 21* GLUCOSE 154* 142* 185* 206* 194*  BUN 32* CREATININE 1.83* 1.58* 1.56* 1.53* 1.51*  CALCIUM 8.5* 8.5* 9.2 9.4 9.1  MG  --   --  1.8 1.8 1.7  PHOS  --   --  2.9 3.3 3.4   Liver Function Tests: Recent Labs  Lab 11/18/20 0548 11/22/20 0239 11/23/20 0259 11/24/20 0347  AST 27  --  13* 18  ALT 21  --  18 18  ALKPHOS 53  --  64 67  BILITOT 1.2  --  0.5 0.8  PROT 6.5  --  6.4* 6.5  ALBUMIN 3.2* 3.0* 3.1* 3.1*   No results for input(s): LIPASE, AMYLASE in the last 168 hours. No results for input(s): AMMONIA in the last 168 hours. CBC: Recent Labs  Lab 11/17/20 1526 11/18/20 0548 11/19/20 0239 11/22/20 0239 11/23/20 0259 11/24/20 0347  WBC 13.1* 11.1* 8.8 10.2 9.7 10.4  NEUTROABS 10.9* 8.8* 6.2  --   --   --   HGB 11.9* 11.1* 10.6* 11.3* 11.8* 12.4  HCT 38.9 36.5 34.2* 36.3 38.5 39.6  MCV 84.6 84.7 84.7 84.0 84.4 82.3  PLT 299 264 227 225 243 239   Cardiac Enzymes: Recent Labs  Lab 11/17/20 2332 11/18/20 0548 11/19/20 0239 11/22/20 0239  CKTOTAL 787* 668* 303* 76   BNP: BNP (last 3 results) No results for  input(s): BNP in the last 8760 hours.  ProBNP (last 3 results) Recent Labs    03/20/20 1145 06/21/20 1129  PROBNP 125.0* 139.0*    CBG: Recent Labs  Lab 11/23/20 0650 11/23/20 1200 11/23/20 1724 11/23/20 2142 11/24/20 0731  GLUCAP 204* 202* 241* 168* 174*       Signed:  Carolyne Littles, MD [facility]

## 2020-11-24 NOTE — TOC Transition Note (Signed)
Transition of Care [facility]) - CM/SW Discharge Note   Patient Details  Name: Pamela [name] MRN: [id number] Date of Birth: [birth year]  Transition of Care [facility]) CM/SW Contact:  Carley Hammed, LCSWA Phone Number: 11/24/2020, 9:39 AM   Clinical Narrative:    Patient is going to Room 117 A at [facility] Number for report: [phone]   Final next level of care: Skilled Nursing Facility Barriers to Discharge: Barriers Resolved   Patient Goals and CMS Choice Patient states their goals for this hospitalization and ongoing recovery are:: to get rehab prior to going home      Discharge Placement              Patient chooses bed at:  [facility]) Patient to be transferred to facility by: [facility] Name of family member notified: Patient Patient and family notified of of transfer: 11/24/20  Discharge Plan and Services In-house Referral: Clinical Social Work Discharge Planning Services: CM Consult Post Acute Care Choice: Skilled Nursing Facility                               Social Determinants of Health (SDOH) Interventions     Readmission Risk Interventions No flowsheet data found.

## 2020-12-13 ENCOUNTER — Observation Stay (HOSPITAL_COMMUNITY)
Admission: EM | Admit: 2020-12-13 | Discharge: 2020-12-16 | Disposition: A | Discharge status: Home / Self Care | Payer: Managed Care, Other (non HMO) | Attending: Internal Medicine | Admitting: Internal Medicine

## 2020-12-13 ENCOUNTER — Encounter (HOSPITAL_COMMUNITY): Payer: Self-pay

## 2020-12-13 DIAGNOSIS — Z7901 Long term (current) use of anticoagulants: Secondary | ICD-10-CM | POA: Insufficient documentation

## 2020-12-13 DIAGNOSIS — I10 Essential (primary) hypertension: Secondary | ICD-10-CM | POA: Diagnosis present

## 2020-12-13 DIAGNOSIS — I130 Hypertensive heart and chronic kidney disease with heart failure and stage 1 through stage 4 chronic kidney disease, or unspecified chronic kidney disease: Secondary | ICD-10-CM | POA: Diagnosis not present

## 2020-12-13 DIAGNOSIS — K5730 Diverticulosis of large intestine without perforation or abscess without bleeding: Secondary | ICD-10-CM | POA: Diagnosis not present

## 2020-12-13 DIAGNOSIS — N1832 Chronic kidney disease, stage 3b: Secondary | ICD-10-CM | POA: Insufficient documentation

## 2020-12-13 DIAGNOSIS — K922 Gastrointestinal hemorrhage, unspecified: Secondary | ICD-10-CM

## 2020-12-13 DIAGNOSIS — K626 Ulcer of anus and rectum: Secondary | ICD-10-CM

## 2020-12-13 DIAGNOSIS — I5033 Acute on chronic diastolic (congestive) heart failure: Secondary | ICD-10-CM | POA: Diagnosis not present

## 2020-12-13 DIAGNOSIS — K625 Hemorrhage of anus and rectum: Secondary | ICD-10-CM | POA: Diagnosis not present

## 2020-12-13 DIAGNOSIS — Z79899 Other long term (current) drug therapy: Secondary | ICD-10-CM | POA: Insufficient documentation

## 2020-12-13 DIAGNOSIS — K633 Ulcer of intestine: Secondary | ICD-10-CM | POA: Diagnosis not present

## 2020-12-13 DIAGNOSIS — K921 Melena: Secondary | ICD-10-CM

## 2020-12-13 DIAGNOSIS — Z7984 Long term (current) use of oral hypoglycemic drugs: Secondary | ICD-10-CM | POA: Diagnosis not present

## 2020-12-13 DIAGNOSIS — E1159 Type 2 diabetes mellitus with other circulatory complications: Secondary | ICD-10-CM | POA: Diagnosis present

## 2020-12-13 DIAGNOSIS — K5901 Slow transit constipation: Secondary | ICD-10-CM | POA: Diagnosis not present

## 2020-12-13 DIAGNOSIS — Z20822 Contact with and (suspected) exposure to covid-19: Secondary | ICD-10-CM | POA: Diagnosis not present

## 2020-12-13 DIAGNOSIS — E876 Hypokalemia: Secondary | ICD-10-CM | POA: Insufficient documentation

## 2020-12-13 DIAGNOSIS — E785 Hyperlipidemia, unspecified: Secondary | ICD-10-CM | POA: Diagnosis present

## 2020-12-13 DIAGNOSIS — E1165 Type 2 diabetes mellitus with hyperglycemia: Secondary | ICD-10-CM

## 2020-12-13 DIAGNOSIS — I480 Paroxysmal atrial fibrillation: Secondary | ICD-10-CM | POA: Diagnosis not present

## 2020-12-13 DIAGNOSIS — E1122 Type 2 diabetes mellitus with diabetic chronic kidney disease: Secondary | ICD-10-CM | POA: Diagnosis not present

## 2020-12-13 DIAGNOSIS — E119 Type 2 diabetes mellitus without complications: Secondary | ICD-10-CM

## 2020-12-13 DIAGNOSIS — I152 Hypertension secondary to endocrine disorders: Secondary | ICD-10-CM | POA: Diagnosis present

## 2020-12-13 DIAGNOSIS — I482 Chronic atrial fibrillation, unspecified: Secondary | ICD-10-CM | POA: Diagnosis present

## 2020-12-13 DIAGNOSIS — N183 Chronic kidney disease, stage 3 unspecified: Secondary | ICD-10-CM | POA: Diagnosis present

## 2020-12-13 LAB — PROTIME-INR
INR: 1.2 (ref 0.8–1.2)
Prothrombin Time: 15.3 seconds — ABNORMAL HIGH (ref 11.4–15.2)

## 2020-12-13 LAB — RESP PANEL BY RT-PCR (FLU A&B, COVID) ARPGX2
Influenza A by PCR: NEGATIVE
Influenza B by PCR: NEGATIVE
SARS Coronavirus 2 by RT PCR: NEGATIVE

## 2020-12-13 LAB — COMPREHENSIVE METABOLIC PANEL
ALT: 16 U/L (ref 0–44)
AST: 21 U/L (ref 15–41)
Albumin: 2.9 g/dL — ABNORMAL LOW (ref 3.5–5.0)
Alkaline Phosphatase: 83 U/L (ref 38–126)
Anion gap: 10 (ref 5–15)
BUN: 22 mg/dL (ref 8–23)
CO2: 27 mmol/L (ref 22–32)
Calcium: 8.7 mg/dL — ABNORMAL LOW (ref 8.9–10.3)
Chloride: 98 mmol/L (ref 98–111)
Creatinine, Ser: 1.76 mg/dL — ABNORMAL HIGH (ref 0.44–1.00)
GFR, Estimated: 32 mL/min — ABNORMAL LOW (ref >60)
Glucose, Bld: 197 mg/dL — ABNORMAL HIGH (ref 70–99)
Potassium: 3.9 mmol/L (ref 3.5–5.1)
Sodium: 135 mmol/L (ref 135–145)
Total Bilirubin: 1.2 mg/dL (ref 0.3–1.2)
Total Protein: 6.3 g/dL — ABNORMAL LOW (ref 6.5–8.1)

## 2020-12-13 LAB — CBC WITH DIFFERENTIAL/PLATELET
Abs Immature Granulocytes: 0.09 10*3/uL — ABNORMAL HIGH (ref 0.00–0.07)
Basophils Absolute: 0.1 10*3/uL (ref 0.0–0.1)
Basophils Relative: 0 %
Eosinophils Absolute: 0.2 10*3/uL (ref 0.0–0.5)
Eosinophils Relative: 1 %
HCT: 41.5 % (ref 36.0–46.0)
Hemoglobin: 12.9 g/dL (ref 12.0–15.0)
Immature Granulocytes: 1 %
Lymphocytes Relative: 11 %
Lymphs Abs: 1.4 10*3/uL (ref 0.7–4.0)
MCH: 25.8 pg — ABNORMAL LOW (ref 26.0–34.0)
MCHC: 31.1 g/dL (ref 30.0–36.0)
MCV: 83 fL (ref 80.0–100.0)
Monocytes Absolute: 0.8 10*3/uL (ref 0.1–1.0)
Monocytes Relative: 7 %
Neutro Abs: 9.8 10*3/uL — ABNORMAL HIGH (ref 1.7–7.7)
Neutrophils Relative %: 80 %
Platelets: 302 10*3/uL (ref 150–400)
RBC: 5 MIL/uL (ref 3.87–5.11)
RDW: 16.1 % — ABNORMAL HIGH (ref 11.5–15.5)
WBC: 12.3 10*3/uL — ABNORMAL HIGH (ref 4.0–10.5)
nRBC: 0 % (ref 0.0–0.2)

## 2020-12-13 LAB — CBC
HCT: 38.1 % (ref 36.0–46.0)
Hemoglobin: 12 g/dL (ref 12.0–15.0)
MCH: 25.6 pg — ABNORMAL LOW (ref 26.0–34.0)
MCHC: 31.5 g/dL (ref 30.0–36.0)
MCV: 81.4 fL (ref 80.0–100.0)
Platelets: 275 10*3/uL (ref 150–400)
RBC: 4.68 MIL/uL (ref 3.87–5.11)
RDW: 15.9 % — ABNORMAL HIGH (ref 11.5–15.5)
WBC: 10.3 10*3/uL (ref 4.0–10.5)
nRBC: 0 % (ref 0.0–0.2)

## 2020-12-13 LAB — POC OCCULT BLOOD, ED: Fecal Occult Bld: POSITIVE — AB

## 2020-12-13 LAB — ABO/RH: ABO/RH(D): A NEG

## 2020-12-13 MED ORDER — PEG-KCL-NACL-NASULF-NA ASC-C 100 G PO SOLR
0.5000 | Freq: Once | ORAL | Status: AC
  Administered 2020-12-13: 100 g via ORAL
  Filled 2020-12-13: qty 1

## 2020-12-13 MED ORDER — LACTATED RINGERS IV SOLN: INTRAVENOUS | Status: DC

## 2020-12-13 MED ORDER — ONDANSETRON HCL 4 MG PO TABS: 4.0000 mg | ORAL_TABLET | Freq: Four times a day (QID) | ORAL | Status: DC | PRN

## 2020-12-13 MED ORDER — ACETAMINOPHEN 650 MG RE SUPP: 650.0000 mg | Freq: Four times a day (QID) | RECTAL | Status: DC | PRN

## 2020-12-13 MED ORDER — ACETAMINOPHEN 325 MG PO TABS: 650.0000 mg | ORAL_TABLET | Freq: Four times a day (QID) | ORAL | Status: DC | PRN

## 2020-12-13 MED ORDER — METOPROLOL SUCCINATE ER 100 MG PO TB24
100.0000 mg | ORAL_TABLET | Freq: Every day | ORAL | Status: DC
  Administered 2020-12-13: 100 mg via ORAL
  Filled 2020-12-13: qty 4

## 2020-12-13 MED ORDER — PEG-KCL-NACL-NASULF-NA ASC-C 100 G PO SOLR: 1.0000 | Freq: Once | ORAL | Status: DC

## 2020-12-13 MED ORDER — PREGABALIN 25 MG PO CAPS
50.0000 mg | ORAL_CAPSULE | Freq: Three times a day (TID) | ORAL | Status: DC
  Administered 2020-12-13 – 2020-12-16 (×8): 50 mg via ORAL
  Filled 2020-12-13 (×7): qty 2

## 2020-12-13 MED ORDER — MORPHINE SULFATE (PF) 2 MG/ML IV SOLN: 2.0000 mg | INTRAVENOUS | Status: DC | PRN

## 2020-12-13 MED ORDER — METOCLOPRAMIDE HCL 5 MG/ML IJ SOLN
5.0000 mg | Freq: Once | INTRAMUSCULAR | Status: AC
  Administered 2020-12-13: 5 mg via INTRAVENOUS

## 2020-12-13 MED ORDER — SODIUM CHLORIDE 0.9% FLUSH
3.0000 mL | Freq: Two times a day (BID) | INTRAVENOUS | Status: DC
  Administered 2020-12-13 – 2020-12-16 (×7): 3 mL via INTRAVENOUS

## 2020-12-13 MED ORDER — DILTIAZEM HCL ER COATED BEADS 180 MG PO CP24
360.0000 mg | ORAL_CAPSULE | Freq: Every day | ORAL | Status: DC
  Administered 2020-12-13 – 2020-12-16 (×4): 360 mg via ORAL
  Filled 2020-12-13 (×3): qty 2
  Filled 2020-12-13: qty 1

## 2020-12-13 MED ORDER — ATORVASTATIN CALCIUM 40 MG PO TABS
40.0000 mg | ORAL_TABLET | Freq: Every day | ORAL | Status: DC
  Administered 2020-12-13 – 2020-12-16 (×4): 40 mg via ORAL
  Filled 2020-12-13 (×4): qty 1

## 2020-12-13 MED ORDER — HYDRALAZINE HCL 20 MG/ML IJ SOLN: 5.0000 mg | INTRAMUSCULAR | Status: DC | PRN

## 2020-12-13 MED ORDER — ONDANSETRON HCL 4 MG/2ML IJ SOLN: 4.0000 mg | Freq: Four times a day (QID) | INTRAMUSCULAR | Status: DC | PRN

## 2020-12-13 MED ORDER — METOCLOPRAMIDE HCL 5 MG/ML IJ SOLN
5.0000 mg | Freq: Once | INTRAMUSCULAR | Status: DC
  Filled 2020-12-13: qty 2

## 2020-12-13 MED ORDER — ALBUTEROL SULFATE (2.5 MG/3ML) 0.083% IN NEBU: 2.5000 mg | INHALATION_SOLUTION | Freq: Four times a day (QID) | RESPIRATORY_TRACT | Status: DC | PRN

## 2020-12-13 MED ORDER — BISACODYL 5 MG PO TBEC
20.0000 mg | DELAYED_RELEASE_TABLET | Freq: Once | ORAL | Status: AC
  Administered 2020-12-13: 20 mg via ORAL

## 2020-12-13 NOTE — H&P (Addendum)
History and Physical    Pamela [name] BJY:[id number] DOB: [birth year] DOA: 12/13/2020  PCP: Corwin LevinsJohn, James W, MD Consultants:  Shirlee LatchMcLean - cardiology; Wagoner - podiatry; Jennette KettleNeal - GYN Patient [facility]: [facility]; NOK: Ronita HippsBrother, Timothy Fahr, [phone]   Chief Complaint: Rectal bleeding  HPI: Pamela [name] is a 65 y.o. female with medical history significant of afib on Xarelto; DM; depression/anxiety; HTN; HLD; and chronic diastolic CHF presenting with rectal bleeding.  She was admitted from 6/11-18 for AKI on stage 3b CKD thought to be due to rhabdo, diuretics, and ARB.  She reports that she came in due to issues with L hip and knee, unable to walk to get off the floor and went 48 hours without eating/drinking and was very dehydrated.  [facility] and is able to stand with a walker but has not walked or made it to the gym at the rehab facility.  She sat up in her wheelchair for several hours yesterday.  She has been constipated throughout and this AM she called for a change in [place] and started having pressure to push to have a BM.  She felt the stool come out and felt relief from constipation, unaware that she was bleeding.  Nursing staff noticed the blood, bright red with clots and it wouldn't stop.  She is unsure if she is still bleeding.  Bleeding was painless.  No h/o prior, has never had a colonoscopy - "I just decided not to have one."      ED Course: Constipation without abdominal pain x 1 month.  This AM with BM in bed - large bloody BM with clots.  Hgb currently 12.  [facility] is consulted.  Has never had a colonoscopy.  Review of Systems: As per HPI; otherwise review of systems reviewed and negative.   Ambulatory Status:  Currently nonambulatory, see above  COVID Vaccine Status:  Complete  Past Medical History:  Diagnosis Date   ALLERGIC RHINITIS 12/31/2007   ANXIETY 12/31/2007   Atrial fibrillation (HCC)    DEPRESSION 12/31/2007    DIABETES MELLITUS, TYPE II 12/31/2007   Diastolic dysfunction 10/11/2010   Hyperlipidemia 04/23/2012   HYPERTENSION 12/31/2007   Migraine    PNA (pneumonia)    in her 20s   Right knee DJD    Rosacea 10/11/2010    Past Surgical History:  Procedure Laterality Date   2 D&C     1980s    Social History   Socioeconomic History   Marital status: Single    Spouse name: Not on file   Number of children: 0   Years of education: Not on file   Highest education level: Not on file  Occupational History   Occupation: CSR Customer serv rep    Employer: [employer]  Tobacco Use   Smoking status: Never   Smokeless tobacco: Never  Substance and Sexual Activity   Alcohol use: Not Currently    Comment: social   Drug use: No   Sexual activity: Not on file  Other Topics Concern   Not on file  Social History Narrative   Not on file   Social Determinants of Health   Financial Resource Strain: Not on file  Food Insecurity: Not on file  Transportation Needs: Not on file  Physical Activity: Not on file  Stress: Not on file  Social Connections: Not on file  Intimate Partner Violence: Not on file    Allergies  Allergen Reactions  Cymbalta [Duloxetine Hcl] Other (See Comments)    "loopiness"   Gabapentin Itching    Family History  Problem Relation Age of Onset   Arthritis Other    Cancer Other        breast   Heart disease Other    Stroke Other    Mental illness Other        emotional   Diabetes Other     Prior to Admission medications   Medication Sig Start Date End Date Taking? Authorizing Provider  acetaminophen (TYLENOL) 325 MG tablet Take 2 tablets (650 mg total) by mouth every 6 (six) hours as needed for mild pain (or Fever >/= 101). 11/24/20   Drema Dallas, MD  albuterol (VENTOLIN HFA) 108 (90 Base) MCG/ACT inhaler Inhale 2 puffs into the lungs every 6 (six) hours as needed for wheezing or shortness of breath. 06/21/20   Corwin Levins, MD  atorvastatin (LIPITOR) 40 MG  tablet TAKE 1 TABLET(40 MG) BY MOUTH DAILY 06/21/20   Corwin Levins, MD  cetirizine (ZYRTEC) 10 MG tablet 1 tab by mouth once daily as needed 06/21/20   Corwin Levins, MD  diltiazem (CARDIZEM CD) 360 MG 24 hr capsule TAKE 1 CAPSULE(360 MG) BY MOUTH DAILY 06/21/20   Corwin Levins, MD  empagliflozin (JARDIANCE) 25 MG TABS tablet TAKE 1 TABLET BY MOUTH DAILY. 06/21/20   Corwin Levins, MD  furosemide (LASIX) 40 MG tablet Take 1 tablet (40 mg total) by mouth daily. Needs appointment for further refills 09/06/20   Laurey Morale, MD  glipiZIDE (GLUCOTROL XL) 10 MG 24 hr tablet 1 tab by mouth once daily 06/21/20   Corwin Levins, MD  meclizine (ANTIVERT) 12.5 MG tablet TAKE 1 TABLET(12.5 MG) BY MOUTH THREE TIMES DAILY AS NEEDED FOR DIZZINESS 09/15/20   Corwin Levins, MD  metoprolol succinate (TOPROL-XL) 100 MG 24 hr tablet Take 1 tablet (100 mg total) by mouth daily. Take with or immediately following a meal. 06/21/20   Corwin Levins, MD  Omega-3 Fatty Acids (FISH OIL) 1000 MG CAPS Take 2 capsules (2,000 mg total) by mouth 2 (two) times daily. 08/19/17   Laurey Morale, MD  pioglitazone (ACTOS) 30 MG tablet Take 1 tablet (30 mg total) by mouth daily. 06/24/20   Corwin Levins, MD  polyethylene glycol (MIRALAX / GLYCOLAX) 17 g packet Take 17 g by mouth 2 (two) times daily as needed for mild constipation. 11/24/20   Drema Dallas, MD  pregabalin (LYRICA) 50 MG capsule TAKE 1 CAPSULE(50 MG) BY MOUTH THREE TIMES DAILY 06/21/20   Corwin Levins, MD  rivaroxaban (XARELTO) 20 MG TABS tablet Take 1 tablet (20 mg total) by mouth daily with supper. 11/24/20   Drema Dallas, MD  senna-docusate (SENOKOT-S) 8.6-50 MG tablet Take 1 tablet by mouth 2 (two) times daily as needed for moderate constipation. 11/24/20   Drema Dallas, MD    Physical Exam: Vitals:   12/13/20 1130 12/13/20 1230 12/13/20 1300 12/13/20 1330  BP: 114/80 119/77 104/70 110/70  Pulse: (!) 101 (!) 101 100 60  Resp: 18  18   Temp:      TempSrc:       SpO2: 96% 94% 93% 97%     General:  Appears chronically ill and sedentary, in NAD Eyes:  PERRL, EOMI, normal lids, iris ENT:  grossly normal hearing, lips & tongue, mmm Neck:  no LAD, masses or thyromegaly Cardiovascular:  RR with mild tachycardia,  no m/r/g. No LE edema.  Respiratory:   CTA bilaterally with no wheezes/rales/rhonchi.  Normal respiratory effort. Abdomen:  soft, +fullness without frank TTP, ND Skin:  no rash or induration seen on limited exam Musculoskeletal:  grossly normal tone BUE/BLE, good ROM, no bony abnormality Lower extremity:  No LE edema.  Limited foot exam with no ulcerations.  2+ distal pulses. Psychiatric:  blunted mood and affect, speech fluent and appropriate, AOx3 Neurologic:  CN 2-12 grossly intact, moves all extremities in coordinated fashion    Radiological Exams on Admission: Independently reviewed - see discussion in A/P where applicable  No results found.  EKG: not done   Labs on Admission: I have personally reviewed the available labs and imaging studies at the time of the admission.  Pertinent labs:   Glucose 197 BUN 22/Creatinine 1.76/GFR 32 - slightly worse than on 6/18 Albumin 2.9 WBC 12.3 COVID/flu negative Heme positive   Assessment/Plan Principal Problem:   Acute lower GI bleeding Active Problems:   Essential hypertension   CKD (chronic kidney disease) stage 3, GFR 30-59 ml/min (HCC)   Hyperlipidemia   Chronic atrial fibrillation (HCC)   Obesity, Class III, BMI 40-49.9 (morbid obesity) (HCC)   Controlled type 2 diabetes mellitus with hyperglycemia (HCC)    Lower GI Bleeding -Differential to include bleeding hemorrhoids; bacterial infectious colitis with likely pathogen Campylobacter jejuni; colonic diverticula; and AVM - but most likely etiology is diverticular bleeding or stercoral ulcer -She is afebrile at this time with mild tachycardia and mild leukocytosis; will not give antibiotics at this time.  -Will place in  observation  -Continue to monitor for recurrent bleeding -CTA is the best radiologic test for localization of GI bleeding, detecting bleeding of 0.3-0.5cc/min - however, she was recently hospitalized with AKI and so this is suboptimal. -If significant bleeding, a tagged RBC scan could be considered to try to determine the most likely source of the bleeding and this would likely be her best option. -GI consult has been requested -She has never had a colonoscopy and is likely to benefit from this, but if her bleeding resolves spontaneously overnight, it may be reasonable to defer this to outpatient given her recent hospitalization. -Her Hgb is currently normal but likely hemoconcentrated.  -Type and screen were done in ED. .  -Monitor closely and follow cbc q12h, transfuse as necessary for Hbg <8.  Stage 3B CKD -Appears to be slightly worse than during prior hospitalization but not significantly so -Would attempt to avoid nephrotoxins - including contrast dye, if possible  Chronic diastolic CHF -Appears to be compensated at this time -Reasonably normal echo in 08/2017  DM -Recent A1c was 6.9, indicating reasonable control -hold Glucotrol, Actos -Cover with moderate-scale SSI   HTN -Continue Cardizem -Hold Jardiance  Afib  -Rate controlled with Cardizem and Metoprolol -Changed from Xarelto to Eliquis in her SNF since last hospitalization, but she is only getting Eliquis once daily dosing so this is likely inappropriate (based on only creatinine >1.5, her dose appears to be 5 mg BID) -Regardless, will hold AC at this time given active bleeding -Her last dose was last PM  HLD -Continue Lipitor -Hold fish oil  Morbid obesity -BMI 47.6 -Weight loss should be encouraged -Outpatient PCP/bariatric medicine/bariatric surgery f/u encouraged   DNR -I have discussed code status with the patient and she would not desire resuscitation and would prefer to die a natural death should that  situation arise.      Note: This patient has been tested  and is negative for the novel coronavirus COVID-19. She has been fully vaccinated against COVID-19.   Level of care: Telemetry Medical  DVT prophylaxis: SCDs Code Status:  DNR - confirmed with patient, gold form at bedside Family Communication: None present; I spoke with the patient's husband by telephone at the time of admission.  Disposition Plan:  The patient is from: home  Anticipated d/c is to: home without [facility] services   Anticipated d/c date will depend on clinical response to treatment, but possibly as early as tomorrow if she has excellent response to treatment  Patient is currently: acutely ill Consults called: GI Admission status: It is my clinical opinion that referral for OBSERVATION is reasonable and necessary in this patient based on the above information provided. The aforementioned taken together are felt to place the patient at high risk for further clinical deterioration. However it is anticipated that the patient may be medically stable for discharge from the hospital within 24 to 48 hours.     Jonah Blue MD [facility]   How to contact the [facility] Attending or Consulting provider 7A - 7P or covering provider during after hours 7P -7A, for this patient?  Check the care team in [facility] [address] and look for a) attending/consulting TR[facility] provider listed and b) the [facility] team listed Log into [email or web address] and use [facility]'s universal password to access. If you do not have the password, please contact the hospital operator. Locate the [facility] provider you are looking for under [facility] and page to a number that you can be directly reached. If you still have difficulty reaching the provider, please page the [facility] (Director on Call) for the Hospitalists listed on amion for assistance.   12/13/2020, 1:56 PM

## 2020-12-13 NOTE — ED Provider Notes (Signed)
[facility] EMERGENCY DEPARTMENT Provider Note   CSN: [id number] Arrival date & time: 12/13/20  0751     History Chief Complaint  Patient presents with   Rectal Bleeding    Pamela [name] is a 65 y.o. female.  Patient with history of atrial fibrillation on Xarelto, chronic CHF, diabetes, currently residing at rehab facility --presents the emergency department for evaluation of rectal bleeding.  Patient states that she has been constipated for the past month or so.  She has not been having abdominal pain or vomiting.  This morning she felt like she had to have a bowel movement and when they help to clean her at [facility], they noted a large amount of blood in the patient's bed.  She actually arrived with 300 cc of blood in a urinal.  She has a large amount of clots noted in the diaper she is wearing.  She denies chest pain, shortness of breath.  She is mainly bedbound but does sometimes sit in the wheelchair.  She needs assistance with standing due to joint pain.  She has never had a colonoscopy.  She will accept a blood transfusion if needed.      Past Medical History:  Diagnosis Date   ALLERGIC RHINITIS 12/31/2007   ANXIETY 12/31/2007   Atrial fibrillation (HCC)    DEPRESSION 12/31/2007   DIABETES MELLITUS, TYPE II 12/31/2007   Diastolic dysfunction 10/11/2010   Hyperlipidemia 04/23/2012   HYPERTENSION 12/31/2007   Migraine    PNA (pneumonia)    in her 20s   Right knee DJD    Rosacea 10/11/2010    Patient Active Problem List   Diagnosis Date Noted   Chronic diastolic CHF (congestive heart failure) (HCC) 11/24/2020   Non-traumatic rhabdomyolysis 11/24/2020   Controlled type 2 diabetes mellitus with hyperglycemia (HCC) 11/24/2020   ARF (acute renal failure) (HCC) 11/18/2020   Elevated CK 11/18/2020   Acute pain of left knee 11/18/2020   AKI (acute kidney injury) (HCC) 11/18/2020   Chronic diastolic heart failure (HCC) 06/24/2020   Debility 06/21/2020    Vertigo 03/20/2020   SOB (shortness of breath) 03/20/2020   Left shoulder pain 02/27/2019   Acute upper respiratory infection 09/07/2018   Low back pain 12/01/2017   Diabetic foot ulcer (HCC) 03/03/2017   Charcot foot due to diabetes mellitus (HCC) 02/28/2017   Diabetic ulcer of toe of left foot associated with type 2 diabetes mellitus, limited to breakdown of skin (HCC) 02/28/2017   Obesity, Class III, BMI 40-49.9 (morbid obesity) (HCC) 02/28/2017   Chronic atrial fibrillation (HCC) 12/08/2015   Navicular fracture of ankle with malunion 05/30/2015   Neuropathic pain of foot 05/30/2015   Navicular fracture, foot 05/02/2015   Right ankle pain 04/19/2015   Cough 11/17/2014   Fatigue 09/25/2014   Ganglion cyst 02/06/2014   Bilateral hand pain 11/29/2012   Anemia, unspecified 04/23/2012   CKD (chronic kidney disease) stage 3, GFR 30-59 ml/min (HCC) 04/23/2012   Hyperlipidemia 04/23/2012   Peripheral neuropathy 01/16/2012   DJD (degenerative joint disease) of knee 04/15/2011   Migraine 04/15/2011   Acute on chronic diastolic heart failure (HCC) 10/28/2010   Rosacea 10/11/2010   Anticoagulated 10/11/2010   Encounter for preventative adult health care exam with abnormal findings 10/06/2010   Diabetes (HCC) 12/31/2007   Anxiety state 12/31/2007   Depression 12/31/2007   Essential hypertension 12/31/2007   ALLERGIC RHINITIS 12/31/2007    Past Surgical History:  Procedure Laterality Date   2 D&C  1980s     OB History   No obstetric history on file.     Family History  Problem Relation Age of Onset   Arthritis Other    Cancer Other        breast   Heart disease Other    Stroke Other    Mental illness Other        emotional   Diabetes Other     Social History   Tobacco Use   Smoking status: Never   Smokeless tobacco: Never  Substance Use Topics   Alcohol use: Yes    Comment: social   Drug use: No    Home Medications Prior to Admission medications    Medication Sig Start Date End Date Taking? Authorizing Provider  acetaminophen (TYLENOL) 325 MG tablet Take 2 tablets (650 mg total) by mouth every 6 (six) hours as needed for mild pain (or Fever >/= 101). 11/24/20   Drema Dallas, MD  albuterol (VENTOLIN HFA) 108 (90 Base) MCG/ACT inhaler Inhale 2 puffs into the lungs every 6 (six) hours as needed for wheezing or shortness of breath. 06/21/20   Corwin Levins, MD  atorvastatin (LIPITOR) 40 MG tablet TAKE 1 TABLET(40 MG) BY MOUTH DAILY 06/21/20   Corwin Levins, MD  cetirizine (ZYRTEC) 10 MG tablet 1 tab by mouth once daily as needed 06/21/20   Corwin Levins, MD  diltiazem (CARDIZEM CD) 360 MG 24 hr capsule TAKE 1 CAPSULE(360 MG) BY MOUTH DAILY 06/21/20   Corwin Levins, MD  empagliflozin (JARDIANCE) 25 MG TABS tablet TAKE 1 TABLET BY MOUTH DAILY. 06/21/20   Corwin Levins, MD  furosemide (LASIX) 40 MG tablet Take 1 tablet (40 mg total) by mouth daily. Needs appointment for further refills 09/06/20   Laurey Morale, MD  glipiZIDE (GLUCOTROL XL) 10 MG 24 hr tablet 1 tab by mouth once daily 06/21/20   Corwin Levins, MD  meclizine (ANTIVERT) 12.5 MG tablet TAKE 1 TABLET(12.5 MG) BY MOUTH THREE TIMES DAILY AS NEEDED FOR DIZZINESS 09/15/20   Corwin Levins, MD  metoprolol succinate (TOPROL-XL) 100 MG 24 hr tablet Take 1 tablet (100 mg total) by mouth daily. Take with or immediately following a meal. 06/21/20   Corwin Levins, MD  Omega-3 Fatty Acids (FISH OIL) 1000 MG CAPS Take 2 capsules (2,000 mg total) by mouth 2 (two) times daily. 08/19/17   Laurey Morale, MD  pioglitazone (ACTOS) 30 MG tablet Take 1 tablet (30 mg total) by mouth daily. 06/24/20   Corwin Levins, MD  polyethylene glycol (MIRALAX / GLYCOLAX) 17 g packet Take 17 g by mouth 2 (two) times daily as needed for mild constipation. 11/24/20   Drema Dallas, MD  pregabalin (LYRICA) 50 MG capsule TAKE 1 CAPSULE(50 MG) BY MOUTH THREE TIMES DAILY 06/21/20   Corwin Levins, MD  rivaroxaban (XARELTO) 20 MG TABS  tablet Take 1 tablet (20 mg total) by mouth daily with supper. 11/24/20   Drema Dallas, MD  senna-docusate (SENOKOT-S) 8.6-50 MG tablet Take 1 tablet by mouth 2 (two) times daily as needed for moderate constipation. 11/24/20   Drema Dallas, MD    Allergies    Cymbalta [duloxetine hcl] and Gabapentin  Review of Systems   Review of Systems  Constitutional:  Negative for fever.  HENT:  Negative for rhinorrhea and sore throat.   Eyes:  Negative for redness.  Respiratory:  Negative for cough and shortness of breath.  Cardiovascular:  Negative for chest pain.  Gastrointestinal:  Positive for blood in stool and constipation. Negative for abdominal pain, diarrhea, nausea and vomiting.  Genitourinary:  Negative for dysuria, frequency, hematuria and urgency.  Musculoskeletal:  Negative for myalgias.  Skin:  Negative for rash.  Neurological:  Negative for headaches.   Physical Exam Updated Vital Signs BP 103/71 (BP Location: Right Arm)   Pulse 95   Temp 98 F (36.7 C) (Oral)   Resp 17   LMP 09/08/2010 (Exact Date)   SpO2 98%   Physical Exam Vitals and nursing note reviewed. Exam conducted with a chaperone present.  Constitutional:      General: She is not in acute distress.    Appearance: She is well-developed.  HENT:     Head: Normocephalic and atraumatic.     Right Ear: External ear normal.     Left Ear: External ear normal.     Nose: Nose normal.  Eyes:     Conjunctiva/sclera: Conjunctivae normal.  Cardiovascular:     Rate and Rhythm: Normal rate and regular rhythm.     Heart sounds: No murmur heard. Pulmonary:     Effort: No respiratory distress.     Breath sounds: No wheezing, rhonchi or rales.  Abdominal:     Palpations: Abdomen is soft.     Tenderness: There is no abdominal tenderness. There is no guarding or rebound.  Genitourinary:    Rectum: Guaiac result positive. No tenderness or external hemorrhoid.     Comments: Large amount of dark clots noted in  patient's diaper. After assisting in getting her cleaned up, I do not see any obvious vaginal blood. Pt feels clots are passing from rectum.  Musculoskeletal:     Cervical back: Normal range of motion and neck supple.     Right lower leg: No edema.     Left lower leg: No edema.  Skin:    General: Skin is warm and dry.     Findings: No rash.  Neurological:     General: No focal deficit present.     Mental Status: She is alert. Mental status is at baseline.     Motor: No weakness.  Psychiatric:        Mood and Affect: Mood normal.    ED Results / Procedures / Treatments   Labs (all labs ordered are listed, but only abnormal results are displayed) Labs Reviewed  CBC WITH DIFFERENTIAL/PLATELET - Abnormal; Notable for the following components:      Result Value   WBC 12.3 (*)    MCH 25.8 (*)    RDW 16.1 (*)    Neutro Abs 9.8 (*)    Abs Immature Granulocytes 0.09 (*)    All other components within normal limits  POC OCCULT BLOOD, ED - Abnormal; Notable for the following components:   Fecal Occult Bld POSITIVE (*)    All other components within normal limits  RESP PANEL BY RT-PCR (FLU A&B, COVID) [id number]  COMPREHENSIVE METABOLIC PANEL  PROTIME-INR  TYPE AND SCREEN  ABO/RH    EKG None  Radiology No results found.  Procedures Procedures   Medications Ordered in ED Medications - No data to display  ED Course  I have reviewed the triage vital signs and the nursing notes.  Pertinent labs & imaging results that were available during my care of the patient were reviewed by me and considered in my medical decision making (see chart for details).  Patient seen and examined. Work-up initiated. She is  stable currently. Will need admission for GI consult, observation. PCP with [facility].   Vital signs reviewed and are as follows: BP 103/71 (BP Location: Right Arm)   Pulse 95   Temp 98 F (36.7 C) (Oral)   Resp 17   LMP 09/08/2010 (Exact Date)   SpO2 98%   10:17 AM Labs  with normal hgb at this point.   Sent secure chat to Dr. Milas HockPerry/Sarah Gribbin PA-C requesting consult and they acknowledged. Will discuss with hospitalist.   10:40 AM Spoke with Dr. Ophelia CharterYates who will see.     MDM Rules/Calculators/A&P                          Admit for GI consult, obs lower GI on anticoagulation. No need for reversal or blood transfusion at this time.    Final Clinical Impression(s) / ED Diagnoses Final diagnoses:  Rectal bleeding    Rx / DC Orders ED Discharge Orders     None        Renne CriglerGeiple, Laketia Vicknair, PA-C 12/13/20 1041    Milagros Lollykstra, Richard S, MD 12/18/20 1523

## 2020-12-13 NOTE — Consult Note (Addendum)
[facility] Consult: 12:21 PM 12/13/2020  LOS: 0 days    Referring Provider: Dr Ophelia Charter  Primary Care Physician:  Corwin Levins, MD Primary Gastroenterologist:  None   Reason for Consultation: Hematochezia in setting of constipation.   HPI: Pamela [name] is a 65 y.o. female.  PMH morbid obesity.  Atrial fib.  Previous Xarelto, currently on Eliquis.Marland Kitchen  NIDDM.  Marland Kitchen  NIDDM diastolic heart failure.  CKD stage IIIb. Previously declined suggestion to undergo screening colonoscopy.  6/11- 11/24/2020 admission for AKI attributed to rhabdo, diuretics, ARB.  Issues with left hip and knee pain, advanced degenerative changes on left knee imaging.  Discharged to [facility], [place] rehab.  Standing but not ambulating with walker.  Mostly spends her days in the bed.  Once, a few days ago got up into a wheelchair as part of her therapy.  Suffering constipation since the admission last month.  Has not had a really decent bowel movement during this interval, passing smaller brown, formed stools.  Feels rectal pressure pressure to have more bowel movements.  Had not seen any blood per rectum.  No benefit from a dose of lactulose several days ago.  Her normal bowel habits are brown, formed, daily.  A few times a year she might skip a day or 2 of having bowel movements.  No history of rectal bleeding other than years ago.  She does feel like she has hemorrhoids.  Yesterday the APP at SNF added Anusol HC cream twice daily for the rectal pressure/discomfort.  Around 4 AM today she passed what she thought was a stool but when staff came to clean her up an hour or so later, they saw stool mixed with blood and clots of blood.  Quantity unclear but sounds like it was at least moderate volume.  She was changed, cleaned up, dressed in a diaper.  When  she arrived in the ED there was some blood in the perirectal area.  Once again cleaned up and dressed in new diaper.  For unclear reasons when she entered SNF, anticoagulation was switched from Xarelto to Eliquis.  She last received Eliquis yesterday.  Also reports progressive exhaustion even just resting in the bed.  This has been since this morning.  No presyncope but did get dizzy a few hours ago in the ED when staff tried to get her up to a seated position.  At arrival to ED heart rate 95-low 100s.  BPs 103-120s/70s.    CTAP w/O contrast: Diffuse colonic diverticulosis.  Cardiomegaly.  Diffuse soft tissue prominence in parapelvic midpole of right kidney, difficult to characterize without IV contrast.  Area could be evaluated with ultrasound or contrasted CT. Hgb 12.9.  3 to 4 weeks ago it ranged from 10.6 -12.4.  12.6 in mid January 2022.  MCV 83. Platelets 302.  INR 1.2. FOBT +.   BUN/creatinine 23/1.7 with GFR 32, was 38 three weeks ago.  Family history: Bleeding hemorrhoids in her father.  During hemorrhoidectomy he had what sounds like cardiac arrest which resulted in a CVA.  Mother had broad GI complaints with no confirmed diagnosis.  Patient normally lives alone and is independent for ADLs.  She drinks very rarely, last alcohol was 2 years ago.  Does not smoke.    Past Medical History:  Diagnosis Date   ALLERGIC RHINITIS 12/31/2007   ANXIETY 12/31/2007   Atrial fibrillation (HCC)    DEPRESSION 12/31/2007   DIABETES MELLITUS, TYPE II 12/31/2007   Diastolic dysfunction 10/11/2010   Hyperlipidemia 04/23/2012   HYPERTENSION 12/31/2007   Migraine    PNA (pneumonia)    in her 20s   Right knee DJD    Rosacea 10/11/2010    Past Surgical History:  Procedure Laterality Date   2 D&C     1980s    Prior to Admission medications   Medication Sig Start Date End Date Taking? Authorizing Provider  acetaminophen (TYLENOL) 325 MG tablet Take 2 tablets (650 mg total) by mouth every 6 (six) hours  as needed for mild pain (or Fever >/= 101). 11/24/20  Yes Drema Dallas, MD  albuterol (VENTOLIN HFA) 108 (90 Base) MCG/ACT inhaler Inhale 2 puffs into the lungs every 6 (six) hours as needed for wheezing or shortness of breath. 06/21/20  Yes Corwin Levins, MD  apixaban (ELIQUIS) 5 MG TABS tablet Take 5 mg by mouth daily.   Yes [provider]  atorvastatin (LIPITOR) 40 MG tablet TAKE 1 TABLET(40 MG) BY MOUTH DAILY Patient taking differently: Take 40 mg by mouth daily. 06/21/20  Yes Corwin Levins, MD  cetirizine (ZYRTEC) 10 MG tablet 1 tab by mouth once daily as needed Patient taking differently: Take 10 mg by mouth as needed for allergies. 06/21/20  Yes Corwin Levins, MD  diltiazem (CARDIZEM CD) 360 MG 24 hr capsule TAKE 1 CAPSULE(360 MG) BY MOUTH DAILY Patient taking differently: Take 360 mg by mouth daily. 06/21/20  Yes Corwin Levins, MD  empagliflozin (JARDIANCE) 25 MG TABS tablet TAKE 1 TABLET BY MOUTH DAILY. Patient taking differently: Take 25 mg by mouth daily. 06/21/20  Yes Corwin Levins, MD  furosemide (LASIX) 40 MG tablet Take 1 tablet (40 mg total) by mouth daily. Needs appointment for further refills 09/06/20  Yes Laurey Morale, MD  glipiZIDE (GLUCOTROL XL) 10 MG 24 hr tablet 1 tab by mouth once daily Patient taking differently: Take 10 mg by mouth daily. 06/21/20  Yes Corwin Levins, MD  meclizine (ANTIVERT) 12.5 MG tablet TAKE 1 TABLET(12.5 MG) BY MOUTH THREE TIMES DAILY AS NEEDED FOR DIZZINESS Patient taking differently: Take 12.5 mg by mouth 3 (three) times daily as needed for dizziness. 09/15/20  Yes Corwin Levins, MD  metoprolol succinate (TOPROL-XL) 100 MG 24 hr tablet Take 1 tablet (100 mg total) by mouth daily. Take with or immediately following a meal. 06/21/20  Yes Corwin Levins, MD  Omega-3 Fatty Acids (FISH OIL) 1000 MG CAPS Take 2 capsules (2,000 mg total) by mouth 2 (two) times daily. 08/19/17  Yes Laurey Morale, MD  pioglitazone (ACTOS) 30 MG tablet Take 1 tablet (30  mg total) by mouth daily. 06/24/20  Yes Corwin Levins, MD  pregabalin (LYRICA) 50 MG capsule TAKE 1 CAPSULE(50 MG) BY MOUTH THREE TIMES DAILY Patient taking differently: Take 50 mg by mouth 3 (three) times daily. 06/21/20  Yes Corwin Levins, MD  senna-docusate (SENOKOT-S) 8.6-50 MG tablet Take 1 tablet by mouth 2 (two) times daily as needed for moderate constipation. 11/24/20  Yes Drema Dallas, MD  polyethylene  glycol (MIRALAX / GLYCOLAX) 17 g packet Take 17 g by mouth 2 (two) times daily as needed for mild constipation. Patient not taking: Reported on 12/13/2020 11/24/20   Drema Dallas, MD  rivaroxaban (XARELTO) 20 MG TABS tablet Take 1 tablet (20 mg total) by mouth daily with supper. Patient not taking: Reported on 12/13/2020 11/24/20   Drema Dallas, MD    Scheduled Meds:  Infusions:  PRN Meds:    Allergies as of 12/13/2020 - Review Complete 12/13/2020  Allergen Reaction Noted   Cymbalta [duloxetine hcl] Other (See Comments) 04/24/2012   Gabapentin Itching 11/29/2012    Family History  Problem Relation Age of Onset   Arthritis Other    Cancer Other        breast   Heart disease Other    Stroke Other    Mental illness Other        emotional   Diabetes Other     Social History   Socioeconomic History   Marital status: Single    Spouse name: Not on file   Number of children: 0   Years of education: Not on file   Highest education level: Not on file  Occupational History   Occupation: CSR Customer serv rep    Employer: [employer]  Tobacco Use   Smoking status: Never   Smokeless tobacco: Never  Substance and Sexual Activity   Alcohol use: Not Currently    Comment: social   Drug use: No   Sexual activity: Not on file  Other Topics Concern   Not on file  Social History Narrative   Not on file   Social Determinants of Health   Financial Resource Strain: Not on file  Food Insecurity: Not on file  Transportation Needs: Not on file  Physical Activity: Not on  file  Stress: Not on file  Social Connections: Not on file  Intimate Partner Violence: Not on file    REVIEW OF SYSTEMS: Constitutional: Exhaustion as per HPI. ENT:  No nose bleeds Pulm: No dyspnea at rest, no cough CV:  No palpitations, no LE edema.  No angina GU:  No hematuria, no frequency GI: See HPI. Heme: Other than this current hematochezia, does not suffer from exuberant or unusual bleeding or bruising.  Has never been treated with iron, B12 or folate supplements. Transfusions: No previous transfusions of blood products. Neuro:  No headaches, no peripheral tingling or numbness Derm:  No itching, no rash or sores.  Endocrine:  No sweats or chills.  No polyuria or dysuria Immunization: Reviewed.  She received Pfizer COVID-19 vaccine Travel:  None beyond local counties in last few months.    PHYSICAL EXAM: Vital signs in last 24 hours: Vitals:   12/13/20 1030 12/13/20 1130  BP: 128/74 114/80  Pulse: (!) 101 (!) 101  Resp: 18 18  Temp:    SpO2: 93% 96%   Wt Readings from Last 3 Encounters:  11/17/20 (!) 158.8 kg  04/05/19 (!) 167.8 kg  02/22/19 (!) 161 kg    General: Morbidly obese, overall appearance of someone who is chronically unwell.  Alert, comfortably laying flat on the bed without a pillow under her head. Head: No facial asymmetry or swelling.  No signs of head trauma. Eyes: Conjunctiva pink.  EOMI.  No scleral icterus. Ears: No gross hearing deficit. Nose: No discharge or congestion Mouth: Oropharynx moist, pink, clear. Neck: Large, no JVD, no masses, no thyromegaly Lungs: Clear bilaterally.  No cough or labored breathing. Heart:  RRR.  No MRG.  S1, S2 present. Abdomen: Large, soft, minimally tender throughout upper and mid abdomen bilaterally.  Do not appreciate organomegaly, hernias, bruits or masses.  No bruising. Rectal: Moderately large external hemorrhoids visible.  ~30 mL of deep red blood visible in the perirectal area and on the diaper.  More blood  seen on exam glove.  Soft stool sitting at the rectum.  Did not palpate any masses. Musc/Skeltl: No joint redness, swelling or gross deformity. Extremities: No CCE. Neurologic: Alert.  Appropriate.  Fully alert and oriented.  No tremor.  Moves all 4 limbs but did not test strength. Skin: No bruising, purpura, sores, suspicious lesions or rashes.  No tattoos. Nodes: No cervical adenopathy Psych: Pleasant, calm, fluid speech.  Intake/Output from previous day: No intake/output data recorded. Intake/Output this shift: No intake/output data recorded.  LAB RESULTS: Recent Labs    12/13/20 0831  WBC 12.3*  HGB 12.9  HCT 41.5  PLT 302   BMET Lab Results  Component Value Date   NA 135 12/13/2020   NA 138 11/24/2020   NA 138 11/23/2020   K 3.9 12/13/2020   K 3.5 11/24/2020   K 3.5 11/23/2020   CL 98 12/13/2020   CL 99 11/24/2020   CL 98 11/23/2020   CO2 27 12/13/2020   CO2 28 11/24/2020   CO2 28 11/23/2020   GLUCOSE 197 (H) 12/13/2020   GLUCOSE 194 (H) 11/24/2020   GLUCOSE 206 (H) 11/23/2020   BUN 22 12/13/2020   BUN 21 11/24/2020   BUN 20 11/23/2020   CREATININE 1.76 (H) 12/13/2020   CREATININE 1.51 (H) 11/24/2020   CREATININE 1.53 (H) 11/23/2020   CALCIUM 8.7 (L) 12/13/2020   CALCIUM 9.1 11/24/2020   CALCIUM 9.4 11/23/2020   LFT Recent Labs    12/13/20 0831  PROT 6.3*  ALBUMIN 2.9*  AST 21  ALT 16  ALKPHOS 83  BILITOT 1.2   PT/INR Lab Results  Component Value Date   INR 1.2 12/13/2020   INR 1.18 12/13/2010   INR 1.5 10/28/2010   Hepatitis Panel No results for input(s): HEPBSAG, HCVAB, HEPAIGM, HEPBIGM in the last 72 hours. C-Diff No components found for: CDIFF Lipase     Component Value Date/Time   LIPASE 25 03/09/2020 1501    Drugs of Abuse  No results found for: LABOPIA, COCAINSCRNUR, LABBENZ, AMPHETMU, THCU, LABBARB   RADIOLOGY STUDIES: No results found.   IMPRESSION:   Other than rectal discomfort/pressure, painless  hematochezia. Diverticular bleed vs stercoral ulcer bleed vs neoplasia vs hemorrhoidal bleed? Noncon CT showing colonic diverticulosis but no clear cause for bleeding.  Atrial fibrillation.  On chronic AC.  Was on Xarelto but switched to Eliquis after admission to SNF rehab 6/18.  Last dose Xarelto was 7/6, yesterday      CKD.  AKI due to rhabdo/diuretics/ARB addressed during last month's admission.  Current GFR worse than level at discharge last month.  Precludes administration of IV contrast for angiography.  Debilitation, advanced left knee arthritis.  Currently resident at SNF/rehab.    PLAN:        Colonoscopy?  Will discuss case with Dr. Marina Goodell who will see patient later today.  Okay for clear liquids which I ordered.  Monitor CBC, suspect H&H will drop.   Jennye Moccasin  12/13/2020, 12:21 PM Phone [phone]  GI ATTENDING  History, laboratories, x-rays, personally reviewed.  Patient seen and examined.  Agree with comprehensive consultation note as outlined above.  Patient presents  with minor lower GI (rectal) bleeding.  Based on history, suspect constipation due to stercoral ulcer.  May be hemorrhoidal.  Has diverticulosis on imaging, but bleeding seems less vigorous than a typical diverticular bleed.  Given her comorbidities and the need for chronic anticoagulation, we will plan colonoscopy tomorrow.  She is HIGH RISK given her comorbidities and anticoagulation status/needs.The nature of the procedure, as well as the risks, benefits, and alternatives were carefully and thoroughly reviewed with the patient. Ample time for discussion and questions allowed. The patient understood, was satisfied, and agreed to proceed.  Wilhemina BonitoJohn N. Eda KeysPerry, Jr., M.D. [facility] Division of Gastroenterology

## 2020-12-13 NOTE — ED Notes (Signed)
Error in charted SP02 documentation of 64%, patient 94% on RA.

## 2020-12-13 NOTE — ED Notes (Signed)
ED Provider at bedside. 

## 2020-12-13 NOTE — ED Notes (Signed)
Started ortho vitalsigns lying patient did well but sitting up on side of bed patient started getting dizzy bp=82\71 report to [employer]. PATIENT IS LYINGFLAT

## 2020-12-13 NOTE — ED Triage Notes (Signed)
Brought form [place] for rectal bleeding, approx 300cc bright red blood this am. Hx hemorrhoids and is on blood thinners. In rehab for reduced mobility.

## 2020-12-13 NOTE — ED Notes (Signed)
Provider at bedside

## 2020-12-13 NOTE — ED Notes (Signed)
Attempted call to floor for report, RN is busy with admit.

## 2020-12-14 ENCOUNTER — Encounter (HOSPITAL_COMMUNITY): Payer: Self-pay | Admitting: Internal Medicine

## 2020-12-14 ENCOUNTER — Other Ambulatory Visit: Payer: Self-pay

## 2020-12-14 ENCOUNTER — Observation Stay (HOSPITAL_COMMUNITY): Payer: Managed Care, Other (non HMO) | Admitting: Anesthesiology

## 2020-12-14 ENCOUNTER — Encounter (HOSPITAL_COMMUNITY)
Admission: EM | Disposition: A | Discharge status: Home / Self Care | Payer: Self-pay | Source: Home / Self Care | Attending: Emergency Medicine

## 2020-12-14 DIAGNOSIS — K625 Hemorrhage of anus and rectum: Secondary | ICD-10-CM

## 2020-12-14 DIAGNOSIS — K626 Ulcer of anus and rectum: Secondary | ICD-10-CM

## 2020-12-14 DIAGNOSIS — K633 Ulcer of intestine: Secondary | ICD-10-CM | POA: Diagnosis not present

## 2020-12-14 DIAGNOSIS — K922 Gastrointestinal hemorrhage, unspecified: Secondary | ICD-10-CM | POA: Diagnosis not present

## 2020-12-14 HISTORY — PX: COLONOSCOPY WITH PROPOFOL: SHX5780

## 2020-12-14 LAB — CBC
HCT: 36.5 % (ref 36.0–46.0)
HCT: 35.8 % — ABNORMAL LOW (ref 36.0–46.0)
Hemoglobin: 11.4 g/dL — ABNORMAL LOW (ref 12.0–15.0)
Hemoglobin: 11.5 g/dL — ABNORMAL LOW (ref 12.0–15.0)
MCH: 25.7 pg — ABNORMAL LOW (ref 26.0–34.0)
MCH: 26.3 pg (ref 26.0–34.0)
MCHC: 31.2 g/dL (ref 30.0–36.0)
MCHC: 32.1 g/dL (ref 30.0–36.0)
MCV: 82.4 fL (ref 80.0–100.0)
MCV: 81.9 fL (ref 80.0–100.0)
Platelets: 270 10*3/uL (ref 150–400)
Platelets: 257 10*3/uL (ref 150–400)
RBC: 4.43 MIL/uL (ref 3.87–5.11)
RBC: 4.37 MIL/uL (ref 3.87–5.11)
RDW: 15.6 % — ABNORMAL HIGH (ref 11.5–15.5)
RDW: 15.6 % — ABNORMAL HIGH (ref 11.5–15.5)
WBC: 10.1 10*3/uL (ref 4.0–10.5)
WBC: 12.7 10*3/uL — ABNORMAL HIGH (ref 4.0–10.5)
nRBC: 0 % (ref 0.0–0.2)
nRBC: 0 % (ref 0.0–0.2)

## 2020-12-14 LAB — PREPARE RBC (CROSSMATCH)

## 2020-12-14 LAB — BASIC METABOLIC PANEL
Anion gap: 13 (ref 5–15)
BUN: 22 mg/dL (ref 8–23)
CO2: 24 mmol/L (ref 22–32)
Calcium: 8.4 mg/dL — ABNORMAL LOW (ref 8.9–10.3)
Chloride: 102 mmol/L (ref 98–111)
Creatinine, Ser: 1.58 mg/dL — ABNORMAL HIGH (ref 0.44–1.00)
GFR, Estimated: 36 mL/min — ABNORMAL LOW (ref >60)
Glucose, Bld: 157 mg/dL — ABNORMAL HIGH (ref 70–99)
Potassium: 3.3 mmol/L — ABNORMAL LOW (ref 3.5–5.1)
Sodium: 139 mmol/L (ref 135–145)

## 2020-12-14 LAB — GLUCOSE, CAPILLARY: Glucose-Capillary: 220 mg/dL — ABNORMAL HIGH (ref 70–99)

## 2020-12-14 SURGERY — COLONOSCOPY WITH PROPOFOL
Anesthesia: Monitor Anesthesia Care

## 2020-12-14 MED ORDER — LIDOCAINE 2% (20 MG/ML) 5 ML SYRINGE
INTRAMUSCULAR | Status: DC | PRN
  Administered 2020-12-14: 100 mg via INTRAVENOUS

## 2020-12-14 MED ORDER — SODIUM CHLORIDE 0.9% IV SOLUTION: Freq: Once | INTRAVENOUS | Status: AC

## 2020-12-14 MED ORDER — METOPROLOL SUCCINATE ER 50 MG PO TB24
50.0000 mg | ORAL_TABLET | Freq: Every day | ORAL | Status: DC
  Administered 2020-12-14 – 2020-12-16 (×3): 50 mg via ORAL
  Filled 2020-12-14 (×3): qty 1

## 2020-12-14 MED ORDER — BISACODYL 5 MG PO TBEC: 20.0000 mg | DELAYED_RELEASE_TABLET | Freq: Once | ORAL | Status: DC

## 2020-12-14 MED ORDER — BISACODYL 5 MG PO TBEC
20.0000 mg | DELAYED_RELEASE_TABLET | ORAL | Status: AC
  Administered 2020-12-14: 20 mg via ORAL
  Filled 2020-12-14: qty 4

## 2020-12-14 MED ORDER — PROPOFOL 10 MG/ML IV BOLUS
INTRAVENOUS | Status: DC | PRN
  Administered 2020-12-14 (×2): 20 mg via INTRAVENOUS

## 2020-12-14 MED ORDER — POLYETHYLENE GLYCOL 3350 17 G PO PACK
68.0000 g | PACK | ORAL | Status: AC
  Administered 2020-12-14: 68 g via ORAL
  Filled 2020-12-14: qty 4

## 2020-12-14 MED ORDER — METOCLOPRAMIDE HCL 5 MG/ML IJ SOLN
10.0000 mg | Freq: Once | INTRAMUSCULAR | Status: AC
  Administered 2020-12-14: 10 mg via INTRAVENOUS
  Filled 2020-12-14: qty 2

## 2020-12-14 MED ORDER — PROPOFOL 500 MG/50ML IV EMUL
INTRAVENOUS | Status: DC | PRN
  Administered 2020-12-14: 100 ug/kg/min via INTRAVENOUS

## 2020-12-14 SURGICAL SUPPLY — 22 items

## 2020-12-14 NOTE — Progress Notes (Signed)
Patient off unit for procedure.

## 2020-12-14 NOTE — Transfer of Care (Signed)
Immediate Anesthesia Transfer of Care Note  Patient: Pamela [name]  Procedure(s) Performed: COLONOSCOPY WITH PROPOFOL  Patient Location: Endoscopy Unit  Anesthesia Type:MAC  Level of Consciousness: awake, alert  and oriented  Airway & Oxygen Therapy: Patient Spontanous Breathing and Patient connected to face mask oxygen  Post-op Assessment: Report given to RN and Post -op Vital signs reviewed and stable  Post vital signs: Reviewed and stable  Last Vitals:  Vitals Value Taken Time  BP 101/41 12/14/20 1613  Temp    Pulse 89 12/14/20 1614  Resp 17 12/14/20 1614  SpO2 100 % 12/14/20 1614  Vitals shown include unvalidated device data.  Last Pain:  Vitals:   12/14/20 1515  TempSrc: Temporal  PainSc: 0-No pain         Complications: No notable events documented.

## 2020-12-14 NOTE — Anesthesia Postprocedure Evaluation (Signed)
Anesthesia Post Note  Patient: Pamela [name]  Procedure(s) Performed: COLONOSCOPY WITH PROPOFOL     Patient location during evaluation: PACU Anesthesia Type: MAC Level of consciousness: awake and alert Pain management: pain level controlled Vital Signs Assessment: post-procedure vital signs reviewed and stable Respiratory status: spontaneous breathing, nonlabored ventilation, respiratory function stable and patient connected to nasal cannula oxygen Cardiovascular status: stable and blood pressure returned to baseline Postop Assessment: no apparent nausea or vomiting Anesthetic complications: no   No notable events documented.  Last Vitals:  Vitals:   12/14/20 1623 12/14/20 1635  BP: 121/61 (!) 124/57  Pulse: 97 91  Resp: 15 14  Temp:    SpO2: 96% 98%    Last Pain:  Vitals:   12/14/20 1635  TempSrc:   PainSc: 0-No pain                 Fynn Adel,W. EDMOND

## 2020-12-14 NOTE — Progress Notes (Signed)
PROGRESS NOTE    Pamela [name]  XTG:[id number] DOB: 1956-05-17 DOA: 12/13/2020 PCP: Corwin Levins, MD  Brief Narrative:Pamela [name] is a 65 y.o. female history of atrial fibrillation on Xarelto, obesity, depression, anxiety, type 2 diabetes mellitus, chronic diastolic CHF was sent to the ED from SNF for rectal bleeding, she was recently hospitalized from 6/11 through 6/18 for acute kidney injury on CKD 3a which was multifactorial from rhabdomyolysis diuretics and ARB she also had ongoing issues with her left knee and pain causing debility and was discharged to [facility] for rehabilitation.  On 7/7 had multiple episodes of BRBPR with clots, sent to the ED -Hgb currently 12.  [facility] is consulted.  Has never had a colonoscopy.   Assessment & Plan:   Acute lower GI bleeding -Suspect diverticular however has never had a colonoscopy -Appears to be slowing down -Transfusing 1 unit of PRBC for profuse active bleeding -Repeat CBC this morning, gastroenterology following, plan for colonoscopy this afternoon  CKD 3a -Creatinine relatively stable, monitor, avoid hypotension  Chronic diastolic CHF -Clinically compensated, discontinue IV fluids -Echo with preserved EF in 08/2017  Type 2 diabetes mellitus -Glucotrol and Actos on hold, sliding scale insulin  Hypertension -Continue Cardizem, decrease Toprol dose  Paroxysmal atrial fibrillation -Recently switched from Xarelto to Eliquis, currently on hold in the setting of lower GI bleed -Continue Cardizem, decrease metoprolol dose  Morbid obesity -BMI is 47.6  Debility/deconditioning Left knee and left hip pain -PT eval once bleeding subsides  DVT prophylaxis: SCDs Code Status: DNR Family Communication: Discussed patient in detail, no family at bedside Disposition Plan:  Status is: Observation  The patient will require care spanning > 2 midnights and should be moved to inpatient because: Inpatient level of care appropriate due  to severity of illness  Dispo: The patient is from: Home              Anticipated d/c is to: SNF              Patient currently is not medically stable to d/c.   Difficult to place patient No        Consultants:  Gastroenterology  Procedures:   Antimicrobials:    Subjective: -Multiple episodes of bleeding overnight, appears to be slowing down this morning  Objective: Vitals:   12/14/20 0521 12/14/20 0536 12/14/20 0808 12/14/20 0925  BP:  115/70 (!) 123/58 113/60  Pulse: 98 (!) 109 100 (!) 101  Resp: 18 18 16 16   Temp:  (!) 97.3 F (36.3 C)  98.7 F (37.1 C)  TempSrc:    Oral  SpO2:   97% 98%    Intake/Output Summary (Last 24 hours) at 12/14/2020 1050 Last data filed at 12/14/2020 0900 Gross per 24 hour  Intake 753 ml  Output --  Net 753 ml   There were no vitals filed for this visit.  Examination:  General exam: Obese chronically ill female, laying in bed, AAOx3 HEENT: Neck obese unable to assess JVD CVS: S1-S2, regular rate rhythm Lungs: Clear bilaterally Abdomen: Soft, nontender, nondistended, bowel sounds present Extremities: No edema Skin: No rashes Psychiatry: Judgement and insight appear normal. Mood & affect appropriate.     Data Reviewed:   CBC: Recent Labs  Lab 12/13/20 0831 12/13/20 1657  WBC 12.3* 10.3  NEUTROABS 9.8*  --   HGB 12.9 12.0  HCT 41.5 38.1  MCV 83.0 81.4  PLT 302 275   Basic Metabolic Panel: Recent Labs  Lab 12/13/20 0831  NA 135  K 3.9  CL 98  CO2 27  GLUCOSE 197*  BUN 22  CREATININE 1.76*  CALCIUM 8.7*   GFR: CrCl cannot be calculated (Unknown ideal weight.). Liver Function Tests: Recent Labs  Lab 12/13/20 0831  AST 21  ALT 16  ALKPHOS 83  BILITOT 1.2  PROT 6.3*  ALBUMIN 2.9*   No results for input(s): LIPASE, AMYLASE in the last 168 hours. No results for input(s): AMMONIA in the last 168 hours. Coagulation Profile: Recent Labs  Lab 12/13/20 0831  INR 1.2   Cardiac Enzymes: No results for  input(s): CKTOTAL, CKMB, CKMBINDEX, TROPONINI in the last 168 hours. BNP (last 3 results) Recent Labs    03/20/20 1145 06/21/20 1129  PROBNP 125.0* 139.0*   HbA1C: No results for input(s): HGBA1C in the last 72 hours. CBG: No results for input(s): GLUCAP in the last 168 hours. Lipid Profile: No results for input(s): CHOL, HDL, LDLCALC, TRIG, CHOLHDL, LDLDIRECT in the last 72 hours. Thyroid Function Tests: No results for input(s): TSH, T4TOTAL, FREET4, T3FREE, THYROIDAB in the last 72 hours. Anemia Panel: No results for input(s): VITAMINB12, FOLATE, FERRITIN, TIBC, IRON, RETICCTPCT in the last 72 hours. Urine analysis:    Component Value Date/Time   COLORURINE YELLOW 06/21/2020 1129   APPEARANCEUR CLEAR 06/21/2020 1129   LABSPEC 1.010 06/21/2020 1129   PHURINE 5.5 06/21/2020 1129   GLUCOSEU >=1000 (A) 06/21/2020 1129   HGBUR NEGATIVE 06/21/2020 1129   BILIRUBINUR NEGATIVE 06/21/2020 1129   KETONESUR NEGATIVE 06/21/2020 1129   UROBILINOGEN 0.2 06/21/2020 1129   NITRITE NEGATIVE 06/21/2020 1129   LEUKOCYTESUR NEGATIVE 06/21/2020 1129   Sepsis Labs: @LABRCNTIP (procalcitonin:4,lacticidven:4)  ) Recent Results (from the past 240 hour(s))  Resp Panel by RT-PCR (Flu A&B, Covid) Nasopharyngeal Swab     Status: None   Collection Time: 12/13/20  8:31 AM   Specimen: Nasopharyngeal Swab; Nasopharyngeal(NP) swabs in vial transport medium  Result Value Ref Range Status   SARS Coronavirus 2 by RT PCR NEGATIVE NEGATIVE Final    Comment: (NOTE) SARS-CoV-2 target nucleic acids are NOT DETECTED.  The SARS-CoV-2 RNA is generally detectable in upper respiratory specimens during the acute phase of infection. The lowest concentration of SARS-CoV-2 viral copies this assay can detect is 138 copies/mL. A negative result does not preclude SARS-Cov-2 infection and should not be used as the sole basis for treatment or other patient management decisions. A negative result may occur with   improper specimen collection/handling, submission of specimen other than nasopharyngeal swab, presence of viral mutation(s) within the areas targeted by this assay, and inadequate number of viral copies(<138 copies/mL). A negative result must be combined with clinical observations, patient history, and epidemiological information. The expected result is Negative.  Fact Sheet for Patients:  02/13/21  Fact Sheet for Healthcare Providers:  BloggerCourse.com  This test is no t yet approved or cleared by the [place] FDA and  has been authorized for detection and/or diagnosis of SARS-CoV-2 by FDA under an Emergency Use Authorization (EUA). This EUA will remain  in effect (meaning this test can be used) for the duration of the COVID-19 declaration under Section 564(b)(1) of the Act, 21 U.S.C.section 360bbb-3(b)(1), unless the authorization is terminated  or revoked sooner.       Influenza A by PCR NEGATIVE NEGATIVE Final   Influenza B by PCR NEGATIVE NEGATIVE Final    Comment: (NOTE) The Xpert Xpress SARS-CoV-2/FLU/RSV plus assay is intended as an aid in the diagnosis of influenza from Nasopharyngeal swab  specimens and should not be used as a sole basis for treatment. Nasal washings and aspirates are unacceptable for Xpert Xpress SARS-CoV-2/FLU/RSV testing.  Fact Sheet for Patients: BloggerCourse.com  Fact Sheet for Healthcare Providers: SeriousBroker.it  This test is not yet approved or cleared by the [place] FDA and has been authorized for detection and/or diagnosis of SARS-CoV-2 by FDA under an Emergency Use Authorization (EUA). This EUA will remain in effect (meaning this test can be used) for the duration of the COVID-19 declaration under Section 564(b)(1) of the Act, 21 U.S.C. section 360bbb-3(b)(1), unless the authorization is terminated  or revoked.  Performed at [facility], [address] [place], [place] [address]          Radiology Studies: No results found.      Scheduled Meds:  atorvastatin  40 mg Oral Daily   bisacodyl  20 mg Oral NOW   diltiazem  360 mg Oral Daily   metoCLOPramide (REGLAN) injection  10 mg Intravenous Once   metoprolol succinate  50 mg Oral Daily   polyethylene glycol  68 g Oral STAT   pregabalin  50 mg Oral TID   sodium chloride flush  3 mL Intravenous Q12H   Continuous Infusions:  lactated ringers 100 mL/hr at 12/13/20 1658     LOS: 0 days    Time spent:    Zannie Cove, MD [facility]   12/14/2020, 10:50 AM

## 2020-12-14 NOTE — Significant Event (Signed)
Patient was noticed to have continued bleeding with dark stools mixed with fresh blood.  Patient's blood pressure is 120/80 pulse is around 60 bpm.  Discussed with on-call gastroenterologist Dr. Leone Payor plan is to transfuse 1 unit PRBC.  If continues to bleed will get CT angiogram of the abdomen.  Closely monitor.  Midge Minium

## 2020-12-14 NOTE — Op Note (Signed)
[facility] Patient Name: Pamela [name] Procedure Date : 12/14/2020 MRN: [id number] Attending MD: Wilhemina Bonito. Marina Goodell , MD Date of Birth: [birth year] CSN: [id number] Age: 65 Admit Type: Inpatient Procedure:                Colonoscopy Indications:              Rectal bleeding Providers:                Wilhemina Bonito. Marina Goodell, MD, Fayrene Fearing, RN, Michele Mcalpine                            Technician Referring MD:              Medicines:                Monitored Anesthesia Care Complications:            No immediate complications. Estimated blood loss:                            None. Estimated Blood Loss:     Estimated blood loss: none. Procedure:                Pre-Anesthesia Assessment:                           - Prior to the procedure, a History and Physical                            was performed, and patient medications and                            allergies were reviewed. The patient's tolerance of                            previous anesthesia was also reviewed. The risks                            and benefits of the procedure and the sedation                            options and risks were discussed with the patient.                            All questions were answered, and informed consent                            was obtained. Prior Anticoagulants: The patient has                            taken no previous anticoagulant or antiplatelet                            agents. ASA Grade Assessment: III - A patient with                            severe systemic disease.  After reviewing the risks                            and benefits, the patient was deemed in                            satisfactory condition to undergo the procedure.                           After obtaining informed consent, the colonoscope                            was passed under direct vision. Throughout the                            procedure, the patient's blood pressure, pulse, and                             oxygen saturations were monitored continuously. The                            CF-HQ190L ([id number]) Olympus colonoscope was                            introduced through the anus and advanced to the the                            cecum, identified by appendiceal orifice and                            ileocecal valve. The ileocecal valve, appendiceal                            orifice, and rectum were photographed. The quality                            of the bowel preparation was adequate to identify                            polyps. The colonoscopy was performed without                            difficulty. The patient tolerated the procedure                            well. The bowel preparation used was MoviPrep via                            split dose instruction. Scope In: 3:53:45 PM Scope Out: 4:07:21 PM Total Procedure Duration: 0 hours 13 minutes 36 seconds  Findings:      Nonbleeding ulcerated mucosa with no stigmata of recent bleeding were       present in the rectum. This is consistent with stercoral ulceration      Multiple diverticula were found in the  left colon.      The exam was otherwise without abnormality on direct and retroflexion       views. No blood or active bleeding Impression:               - Mucosal ulceration (stercoral ulcer).                           - Diverticulosis in the left colon.                           - The examination was otherwise normal on direct                            and retroflexion views.                           - No blood or active bleeding. Recommendation:           - Repeat colonoscopy in 10 years for screening                            purposes.                           - Patient has a contact number available for                            emergencies. The signs and symptoms of potential                            delayed complications were discussed with the                            patient. Return to normal  activities tomorrow.                            Written discharge instructions were provided to the                            patient.                           - Resume previous diet.                           - Continue present medications                           -Keep bowels regular. Would recommend MiraLAX 1-2                            doses daily. Adjust as needed to achieve 1 or 2                            bowel movements per day  The patient has been given a copy of this report. I                            have reviewed the findings. GI will sign off.                           . Procedure Code(s):        --- Professional ---                           [id number], Colonoscopy, flexible; diagnostic, including                            collection of specimen(s) by brushing or washing,                            when performed (separate procedure) Diagnosis Code(s):        --- Professional ---                           K63.3, Ulcer of intestine                           K62.5, Hemorrhage of anus and rectum                           K57.30, Diverticulosis of large intestine without                            perforation or abscess without bleeding CPT copyright 2019 American Medical Association. All rights reserved. The codes documented in this report are preliminary and upon coder review may  be revised to meet current compliance requirements. Wilhemina BonitoJohn N. Marina GoodellPerry, MD 12/14/2020 4:22:50 PM This report has been signed electronically. Number of Addenda: 0

## 2020-12-14 NOTE — Anesthesia Preprocedure Evaluation (Addendum)
Anesthesia Evaluation  Patient identified by MRN, date of birth, ID band Patient awake    Reviewed: Allergy & Precautions, H&P , NPO status , Patient's Chart, lab work & pertinent test results, reviewed documented beta blocker date and time   Airway Mallampati: III  TM Distance: >3 FB Neck ROM: Full    Dental no notable dental hx. (+) Teeth Intact, Dental Advisory Given   Pulmonary neg pulmonary ROS,    Pulmonary exam normal breath sounds clear to auscultation       Cardiovascular hypertension, Pt. on medications and Pt. on home beta blockers +CHF  + dysrhythmias Atrial Fibrillation  Rhythm:Irregular Rate:Normal     Neuro/Psych  Headaches, Anxiety Depression    GI/Hepatic negative GI ROS, Neg liver ROS,   Endo/Other  diabetes, Type 2, Oral Hypoglycemic AgentsMorbid obesity  Renal/GU Renal disease  negative genitourinary   Musculoskeletal  (+) Arthritis ,   Abdominal   Peds  Hematology  (+) Blood dyscrasia, anemia ,   Anesthesia Other Findings   Reproductive/Obstetrics negative OB ROS                            Anesthesia Physical Anesthesia Plan  ASA: 3  Anesthesia Plan: MAC   Post-op Pain Management:    Induction: Intravenous  PONV Risk Score and Plan: 2 and Propofol infusion and Treatment may vary due to age or medical condition  Airway Management Planned: Simple Face Mask  Additional Equipment:   Intra-op Plan:   Post-operative Plan:   Informed Consent: I have reviewed the patients History and Physical, chart, labs and discussed the procedure including the risks, benefits and alternatives for the proposed anesthesia with the patient or authorized representative who has indicated his/her understanding and acceptance.   Patient has DNR.  Discussed DNR with patient and Suspend DNR.   Dental advisory given  Plan Discussed with: CRNA  Anesthesia Plan Comments:         Anesthesia Quick Evaluation

## 2020-12-14 NOTE — Progress Notes (Signed)
Patient back to unit. VSS, No distress noted denies pain at this time. Call bell within reach

## 2020-12-14 NOTE — Progress Notes (Addendum)
Spoke with the patient as well as patient's nurse.  As of early this morning on previous shift stool was large, soft but no longer bloody.  Previous bloody stool at 4 AM today. Stools are not yet clear. Because of the previous bleeding, though no hemodynamic instability, received 1 PRBC, transfusion ended at 9 AM today.  No follow-up CBC yet but it was 12 at 1700 yesterday evening. Reviewed recent pressures which are soft but not worrisome.  1 teens-120/50s-70s.  HR in the low 100s. Patient feeling well, no complaints.  Patient still on the schedule for 230 colonoscopy today. Ordered additional prep of 4 packets of MiraLAX mixed with water/apple juice, Dulcolax 20 and Reglan  Jennye Moccasin PA-C  GI ATTENDING  Above-noted.  For colonoscopy later today.  Wilhemina Bonito. Eda Keys., M.D. [facility] Division of Gastroenterology

## 2020-12-15 DIAGNOSIS — K922 Gastrointestinal hemorrhage, unspecified: Secondary | ICD-10-CM | POA: Diagnosis not present

## 2020-12-15 LAB — BASIC METABOLIC PANEL
Anion gap: 7 (ref 5–15)
BUN: 14 mg/dL (ref 8–23)
CO2: 26 mmol/L (ref 22–32)
Calcium: 8.6 mg/dL — ABNORMAL LOW (ref 8.9–10.3)
Chloride: 99 mmol/L (ref 98–111)
Creatinine, Ser: 1.57 mg/dL — ABNORMAL HIGH (ref 0.44–1.00)
GFR, Estimated: 37 mL/min — ABNORMAL LOW (ref >60)
Glucose, Bld: 335 mg/dL — ABNORMAL HIGH (ref 70–99)
Potassium: 4 mmol/L (ref 3.5–5.1)
Sodium: 132 mmol/L — ABNORMAL LOW (ref 135–145)

## 2020-12-15 LAB — TYPE AND SCREEN
ABO/RH(D): A NEG
Antibody Screen: NEGATIVE
Unit division: 0

## 2020-12-15 LAB — COMPREHENSIVE METABOLIC PANEL
ALT: 13 U/L (ref 0–44)
AST: 12 U/L — ABNORMAL LOW (ref 15–41)
Albumin: 2.6 g/dL — ABNORMAL LOW (ref 3.5–5.0)
Alkaline Phosphatase: 63 U/L (ref 38–126)
Anion gap: 8 (ref 5–15)
BUN: 15 mg/dL (ref 8–23)
CO2: 28 mmol/L (ref 22–32)
Calcium: 8.7 mg/dL — ABNORMAL LOW (ref 8.9–10.3)
Chloride: 99 mmol/L (ref 98–111)
Creatinine, Ser: 1.31 mg/dL — ABNORMAL HIGH (ref 0.44–1.00)
GFR, Estimated: 45 mL/min — ABNORMAL LOW (ref >60)
Glucose, Bld: 157 mg/dL — ABNORMAL HIGH (ref 70–99)
Potassium: 2.7 mmol/L — CL (ref 3.5–5.1)
Sodium: 135 mmol/L (ref 135–145)
Total Bilirubin: 1 mg/dL (ref 0.3–1.2)
Total Protein: 5.4 g/dL — ABNORMAL LOW (ref 6.5–8.1)

## 2020-12-15 LAB — BPAM RBC
Blood Product Expiration Date: 202207132359
ISSUE DATE / TIME: 202207080515
Unit Type and Rh: 600

## 2020-12-15 LAB — GLUCOSE, CAPILLARY
Glucose-Capillary: 144 mg/dL — ABNORMAL HIGH (ref 70–99)
Glucose-Capillary: 243 mg/dL — ABNORMAL HIGH (ref 70–99)

## 2020-12-15 LAB — CBC
HCT: 32.8 % — ABNORMAL LOW (ref 36.0–46.0)
Hemoglobin: 10.6 g/dL — ABNORMAL LOW (ref 12.0–15.0)
MCH: 26.5 pg (ref 26.0–34.0)
MCHC: 32.3 g/dL (ref 30.0–36.0)
MCV: 82 fL (ref 80.0–100.0)
Platelets: 222 10*3/uL (ref 150–400)
RBC: 4 MIL/uL (ref 3.87–5.11)
RDW: 15.6 % — ABNORMAL HIGH (ref 11.5–15.5)
WBC: 11 10*3/uL — ABNORMAL HIGH (ref 4.0–10.5)
nRBC: 0.2 % (ref 0.0–0.2)

## 2020-12-15 MED ORDER — POTASSIUM CHLORIDE CRYS ER 20 MEQ PO TBCR
40.0000 meq | EXTENDED_RELEASE_TABLET | ORAL | Status: AC
  Administered 2020-12-15 (×3): 40 meq via ORAL
  Filled 2020-12-15 (×3): qty 2

## 2020-12-15 NOTE — Progress Notes (Signed)
MD on call text paged about patient's critical potassium value of 2.7. No response as at now. Patient sleeping and stable. Will pass it on to incoming RN.

## 2020-12-15 NOTE — Progress Notes (Signed)
OT Cancellation Note  Patient Details Name: Pamela [name] MRN: [id number] DOB: 1955/12/04   Cancelled Treatment:    Reason Eval/Treat Not Completed: Patient not medically ready Pt with potassium value 2.7. Will return as time allows and pt is appropriate.   [facility] OTR/L Acute Rehabilitation Services Office: [phone]   Rebeca Alert 12/15/2020, 8:56 AM

## 2020-12-15 NOTE — Progress Notes (Addendum)
PROGRESS NOTE    SIARA GORDER  PVX:[id number] DOB: 1956/03/05 DOA: 12/13/2020 PCP: Corwin Levins, MD  Brief Narrative:Pamela [name] is a 65 y.o. female history of atrial fibrillation on Xarelto, obesity, depression, anxiety, type 2 diabetes mellitus, chronic diastolic CHF was sent to the ED from SNF for rectal bleeding, she was recently hospitalized from 6/11 through 6/18 for acute kidney injury on CKD 3a which was multifactorial from rhabdomyolysis diuretics and ARB she also had ongoing issues with her left knee and pain causing debility and was discharged to [facility] for rehabilitation.  On 7/7 had multiple episodes of BRBPR with clots, sent to the ED -Hgb currently 12.  [facility] is consulted.  Has never had a colonoscopy.  Assessment & Plan:   Acute lower GI bleeding -appears to have resolved, GI input appreciated, -Transfused 1 unit of PRBC for profuse active bleeding yesterday am -Colonoscopy yesterday with stercoral ulcer, no active bleeding, recommended MiraLAX at discharge -Hemoglobin relatively stable, restart Eliquis at discharge -Discharge planning, PT OT will hopefully discharge back to rehab soon -[facility] consult for SNF  Severe hypokalemia -Like likely from diarrhea, bowel prep yesterday will replace  CKD 3a -Creatinine relatively stable, monitor, avoid hypotension  Chronic diastolic CHF -Clinically compensated, discontinue IV fluids -Echo with preserved EF in 08/2017  Type 2 diabetes mellitus -Glucotrol and Actos on hold, sliding scale insulin  Hypertension -Continue Cardizem, Toprol  Paroxysmal atrial fibrillation -Recently switched from Xarelto to Eliquis, currently on hold in the setting of lower GI bleed -Continue Cardizem, metoprolol  Morbid obesity -BMI is 47.6  Debility/deconditioning Left knee and left hip pain, history of osteoarthritis -PT eval  -Will send referral to orthopedics -Discharge back to SNF for rehabilitation  DVT prophylaxis:  SCDs Code Status: DNR Family Communication: Discussed patient in detail, no family at bedside Disposition Plan:  Status is: Observation  The patient will require care spanning > 2 midnights and should be moved to inpatient because: Inpatient level of care appropriate due to severity of illness  Dispo: The patient is from: Home              Anticipated d/c is to: SNF              Patient currently is not medically stable to d/c.   Difficult to place patient No        Consultants:  Gastroenterology  Procedures: ColonoscopyImpression:               - Mucosal ulceration (stercoral ulcer).                           - Diverticulosis in the left colon.                           - The examination was otherwise normal on direct                           and retroflexion views.                           - No blood or active bleeding. Recommendation:           - Repeat colonoscopy in 10 years for screening  purposes, MiraLAX daily  Antimicrobials:    Subjective: -Feels better, no further bleeding overnight, potassium very low this morning  Objective: Vitals:   12/15/20 0034 12/15/20 0444 12/15/20 0918 12/15/20 1142  BP: 119/61 124/65 (!) 106/58 125/72  Pulse: 73 83 70 89  Resp: 18 18  18   Temp: 98.2 F (36.8 C) 98.4 F (36.9 C)  98.5 F (36.9 C)  TempSrc: Oral Oral  Oral  SpO2: 98% 98%  98%  Weight:      Height:        Intake/Output Summary (Last 24 hours) at 12/15/2020 1153 Last data filed at 12/15/2020 02/15/2021 Gross per 24 hour  Intake 503 ml  Output 260 ml  Net 243 ml   Filed Weights   12/14/20 1143 12/14/20 1515  Weight: (!) 158.8 kg (!) 158.8 kg    Examination:  General exam: Obese chronically ill female laying in bed, AAOx3, no distress CVS: S1-S2, regular rate rhythm Lungs: Clear bilaterally Abdomen: Soft, nontender, bowel sounds present Extremities: No edema Skin: No rashes Psychiatry: Judgement and insight appear normal. Mood &  affect appropriate.     Data Reviewed:   CBC: Recent Labs  Lab 12/13/20 0831 12/13/20 1657 12/14/20 1039 12/14/20 1743 12/15/20 0512  WBC 12.3* 10.3 10.1 12.7* 11.0*  NEUTROABS 9.8*  --   --   --   --   HGB 12.9 12.0 11.4* 11.5* 10.6*  HCT 41.5 38.1 36.5 35.8* 32.8*  MCV 83.0 81.4 82.4 81.9 82.0  PLT 302 275 270 257 222   Basic Metabolic Panel: Recent Labs  Lab 12/13/20 0831 12/14/20 1039 12/15/20 0512  NA 135 139 135  K 3.9 3.3* 2.7*  CL 98 102 99  CO2 27 24 28   GLUCOSE 197* 157* 157*  BUN 22 22 15   CREATININE 1.76* 1.58* 1.31*  CALCIUM 8.7* 8.4* 8.7*   GFR: Estimated Creatinine Clearance: 73.6 mL/min (A) (by C-G formula based on SCr of 1.31 mg/dL (H)). Liver Function Tests: Recent Labs  Lab 12/13/20 0831 12/15/20 0512  AST 21 12*  ALT 16 13  ALKPHOS 83 63  BILITOT 1.2 1.0  PROT 6.3* 5.4*  ALBUMIN 2.9* 2.6*   No results for input(s): LIPASE, AMYLASE in the last 168 hours. No results for input(s): AMMONIA in the last 168 hours. Coagulation Profile: Recent Labs  Lab 12/13/20 0831  INR 1.2   Cardiac Enzymes: No results for input(s): CKTOTAL, CKMB, CKMBINDEX, TROPONINI in the last 168 hours. BNP (last 3 results) Recent Labs    03/20/20 1145 06/21/20 1129  PROBNP 125.0* 139.0*   HbA1C: No results for input(s): HGBA1C in the last 72 hours. CBG: Recent Labs  Lab 12/14/20 2120 12/15/20 0617  GLUCAP 220* 144*   Lipid Profile: No results for input(s): CHOL, HDL, LDLCALC, TRIG, CHOLHDL, LDLDIRECT in the last 72 hours. Thyroid Function Tests: No results for input(s): TSH, T4TOTAL, FREET4, T3FREE, THYROIDAB in the last 72 hours. Anemia Panel: No results for input(s): VITAMINB12, FOLATE, FERRITIN, TIBC, IRON, RETICCTPCT in the last 72 hours. Urine analysis:    Component Value Date/Time   COLORURINE YELLOW 06/21/2020 1129   APPEARANCEUR CLEAR 06/21/2020 1129   LABSPEC 1.010 06/21/2020 1129   PHURINE 5.5 06/21/2020 1129   GLUCOSEU >=1000 (A)  06/21/2020 1129   HGBUR NEGATIVE 06/21/2020 1129   BILIRUBINUR NEGATIVE 06/21/2020 1129   KETONESUR NEGATIVE 06/21/2020 1129   UROBILINOGEN 0.2 06/21/2020 1129   NITRITE NEGATIVE 06/21/2020 1129   LEUKOCYTESUR NEGATIVE 06/21/2020 1129   Sepsis Labs: @LABRCNTIP (procalcitonin:4,lacticidven:4)  )  Recent Results (from the past 240 hour(s))  Resp Panel by RT-PCR (Flu A&B, Covid) Nasopharyngeal Swab     Status: None   Collection Time: 12/13/20  8:31 AM   Specimen: Nasopharyngeal Swab; Nasopharyngeal(NP) swabs in vial transport medium  Result Value Ref Range Status   SARS Coronavirus 2 by RT PCR NEGATIVE NEGATIVE Final    Comment: (NOTE) SARS-CoV-2 target nucleic acids are NOT DETECTED.  The SARS-CoV-2 RNA is generally detectable in upper respiratory specimens during the acute phase of infection. The lowest concentration of SARS-CoV-2 viral copies this assay can detect is 138 copies/mL. A negative result does not preclude SARS-Cov-2 infection and should not be used as the sole basis for treatment or other patient management decisions. A negative result may occur with  improper specimen collection/handling, submission of specimen other than nasopharyngeal swab, presence of viral mutation(s) within the areas targeted by this assay, and inadequate number of viral copies(<138 copies/mL). A negative result must be combined with clinical observations, patient history, and epidemiological information. The expected result is Negative.  Fact Sheet for Patients:  BloggerCourse.com  Fact Sheet for Healthcare Providers:  SeriousBroker.it  This test is no t yet approved or cleared by the [place] FDA and  has been authorized for detection and/or diagnosis of SARS-CoV-2 by FDA under an Emergency Use Authorization (EUA). This EUA will remain  in effect (meaning this test can be used) for the duration of the COVID-19 declaration under Section  564(b)(1) of the Act, 21 U.S.C.section 360bbb-3(b)(1), unless the authorization is terminated  or revoked sooner.       Influenza A by PCR NEGATIVE NEGATIVE Final   Influenza B by PCR NEGATIVE NEGATIVE Final    Comment: (NOTE) The Xpert Xpress SARS-CoV-2/FLU/RSV plus assay is intended as an aid in the diagnosis of influenza from Nasopharyngeal swab specimens and should not be used as a sole basis for treatment. Nasal washings and aspirates are unacceptable for Xpert Xpress SARS-CoV-2/FLU/RSV testing.  Fact Sheet for Patients: BloggerCourse.com  Fact Sheet for Healthcare Providers: SeriousBroker.it  This test is not yet approved or cleared by the [place] FDA and has been authorized for detection and/or diagnosis of SARS-CoV-2 by FDA under an Emergency Use Authorization (EUA). This EUA will remain in effect (meaning this test can be used) for the duration of the COVID-19 declaration under Section 564(b)(1) of the Act, 21 U.S.C. section 360bbb-3(b)(1), unless the authorization is terminated or revoked.  Performed at [facility], [address] [place], [place] [address]      Scheduled Meds:  atorvastatin  40 mg Oral Daily   diltiazem  360 mg Oral Daily   metoprolol succinate  50 mg Oral Daily   potassium chloride  40 mEq Oral Q2H   pregabalin  50 mg Oral TID   sodium chloride flush  3 mL Intravenous Q12H   Continuous Infusions:     LOS: 0 days    Time spent:    Zannie Cove, MD [facility]   12/15/2020, 11:53 AM

## 2020-12-15 NOTE — Evaluation (Signed)
Physical Therapy Evaluation Patient Details Name: Pamela [name] MRN: [id number] DOB: 03-18-1956 Today's Date: 12/15/2020   History of Present Illness  Pt is a 65 y.o. female who presented 7/7 from SNF with rectal bleeding. Of note, she came from home and was admitted from 6/11-18 for AKI on stage 3b CKD thought to be due to rhabdo, diuretics, and ARB.  Per chart, she came in then due to issues with L hip and knee, unable to walk to get off the floor and went 48 hours without eating/drinking and was very dehydrated. Pt was discharged then to [facility] rehab. PMH: anxiety, a-fib, depression, DM2, diastolic dysfunction, HTN, migraines, R knee DJD, rosacea.   Clinical Impression  Pt presents with condition above and deficits mentioned below, see PT Problem List. PTA, she was most recently at a SNF working on standing and preparing for gait training as she was living at home and independent prior to her admission on 11/17/20. Currently, pt displays hip and knee pain along with limitations in bil knee flexion and extension ROM, bil lower extremity strength, gross coordination, balance, and activity tolerance that place her at high risk for falls. Pt requiring minA to come to stand when using a stedy, but would likely need increased assistance when trying to transfer with a RW. Of note, pt was unaware she was incontinent of stool this date. Recommend continued skilled PT at SNF to further address her deficits to maximize her safety and independence with all functional mobility to allow her to go home. Will continue to follow acutely.   Of note, pt became lightheaded after standing a couple times with SpO2 >/= 95%, may benefit from having her orthostatics taken    Follow Up Recommendations SNF    Equipment Recommendations  None recommended by PT    Recommendations for Other Services       Precautions / Restrictions Precautions Precautions: Fall Precaution Comments: L knee  pain Restrictions Weight Bearing Restrictions: No      Mobility  Bed Mobility Overal bed mobility: Needs Assistance Bed Mobility: Sit to Supine;Rolling;Sidelying to Sit Rolling: Supervision Sidelying to sit: Supervision   Sit to supine: Supervision   General bed mobility comments: supervision for safety, pt utilized bed rails    Transfers Overall transfer level: Needs assistance Equipment used: Ambulation equipment used Transfers: Sit to/from Stand Sit to Stand: Min assist         General transfer comment: Sit to stand 1x EOB > stedy, 2x from stedy flaps, minA to power up and extend hips and trunk.  Ambulation/Gait             General Gait Details: Unable  Stairs            Wheelchair Mobility    Modified Rankin (Stroke Patients Only)       Balance Overall balance assessment: Needs assistance Sitting-balance support: Feet supported;No upper extremity supported Sitting balance-[name]: Fair Sitting balance - Comments: static sitting and weight shifting supervision at most no LOB   Standing balance support: Bilateral upper extremity supported Standing balance-[name]: Poor Standing balance comment: Pt requires bil UE support on stedy bar and min guard-minA to maintain static standing balance x3 bouts of ~15-40 sec each                             Pertinent Vitals/Pain Pain Assessment: Faces Faces Pain Scale: Hurts little more Pain Location: L hip and R knee  with standing Pain Descriptors / Indicators: Grimacing;Guarding;Discomfort Pain Intervention(s): Limited activity within patient's tolerance;Monitored during session;Repositioned    Home Living Family/patient expects to be discharged to:: Skilled nursing facility Living Arrangements: Alone Available Help at Discharge: Family;Available PRN/intermittently Type of Home: Apartment Home Access: Level entry     Home Layout: One level Home Equipment: Grab bars - tub/shower       Prior Function Level of Independence: Needs assistance   Gait / Transfers Assistance Needed: pt reports she was able to stand for <31min, was not walking while at SNF, was not able to stand pivot to surfaces  ADL's / Homemaking Assistance Needed: Pt reports she was having assistance with all ADL. Pt was not getting to [facility] at SNF, she was wearing diapers  Comments: prior to previous SNF admission pt was independent with ADL/IADL and self-care     Hand Dominance   Dominant Hand: Right    Extremity/Trunk Assessment   Upper Extremity Assessment Upper Extremity Assessment: Generalized weakness    Lower Extremity Assessment Lower Extremity Assessment: RLE deficits/detail;LLE deficits/detail;Generalized weakness RLE Deficits / Details: Generalized weakness; limited knee flexion to ~90 degrees and knee extension to ~ -10 degrees RLE Sensation: history of peripheral neuropathy RLE Coordination: decreased gross motor LLE Deficits / Details: Generalized weakness; limited knee flexion to ~90 degrees and knee extension to ~ -10 degrees LLE Sensation: history of peripheral neuropathy LLE Coordination: decreased gross motor    Cervical / Trunk Assessment Cervical / Trunk Assessment: Normal  Communication   Communication: No difficulties  Cognition Arousal/Alertness: Awake/alert Behavior During Therapy: WFL for tasks assessed/performed Overall Cognitive Status: Within Functional Limits for tasks assessed                                 General Comments: Unaware of diarrhea though      General Comments General comments (skin integrity, edema, etc.): Pt reported feeling lightheaded after second standing bout, SpO2 >/= 95%; educated pt to get up with nursing and to perform lower extremity AROM in bed as HEP    Exercises     Assessment/Plan    PT Assessment Patient needs continued PT services  PT Problem List Decreased strength;Decreased activity tolerance;Decreased  balance;Decreased mobility;Decreased coordination;Decreased knowledge of use of DME;Decreased safety awareness;Decreased knowledge of precautions;Pain;Decreased range of motion;Impaired sensation;Obesity       PT Treatment Interventions DME instruction;Gait training;Functional mobility training;Therapeutic activities;Neuromuscular re-education;Balance training;Therapeutic exercise;Patient/family education    PT Goals (Current goals can be found in the Care Plan section)  Acute Rehab PT Goals Patient Stated Goal: to go back to SNF PT Goal Formulation: With patient Time For Goal Achievement: 12/29/20 Potential to Achieve Goals: Good    Frequency Min 2X/week   Barriers to discharge        Co-evaluation               AM-PAC PT "6 Clicks" Mobility  Outcome Measure Help needed turning from your back to your side while in a flat bed without using bedrails?: A Little Help needed moving from lying on your back to sitting on the side of a flat bed without using bedrails?: A Little Help needed moving to and from a bed to a chair (including a wheelchair)?: Total Help needed standing up from a chair using your arms (e.g., wheelchair or bedside chair)?: A Lot Help needed to walk in hospital room?: Total Help needed climbing 3-5 steps with a railing? :  Total 6 Click Score: 11    End of Session Equipment Utilized During Treatment: Gait belt Activity Tolerance: Patient tolerated treatment well Patient left: in bed;with call bell/phone within reach;with bed alarm set Nurse Communication: Mobility status;Need for lift equipment;Other (comment) (diarrhea) PT Visit Diagnosis: Other abnormalities of gait and mobility (R26.89);Pain;Unsteadiness on feet (R26.81);Muscle weakness (generalized) (M62.81);Difficulty in walking, not elsewhere classified (R26.2) Pain - Right/Left:  (bil) Pain - part of body: Knee;Hip    Time: 7425-9563 PT Time Calculation (min) (ACUTE ONLY): 39 min   Charges:    PT Evaluation $PT Eval Moderate Complexity: 1 Mod PT Treatments $Therapeutic Activity: 23-37 mins        Raymond Gurney, PT, DPT Acute Rehabilitation Services  Pager: [phone] Office: [phone]   Jewel Baize 12/15/2020, 6:09 PM

## 2020-12-15 NOTE — Evaluation (Signed)
Occupational Therapy Evaluation Patient Details Name: Pamela [name] MRN: [id number] DOB: 03-22-1956 Today's Date: 12/15/2020    History of Present Illness Pt is a 65 y/o female admitted secondary to difficulty walking and unable to get up from the commode at home.  Patient had to sit on the commode for many hours following which patient had to roll onto the floor to reach the phone and called her brother.  Patient was having significant pain of the left knee.  She has not had any food for almost 48 hours and has not taken her medications. PMH including but not limited to DM, HTN, A. fib, diastolic CHF, CKD.   Clinical Impression   PTA, pt was residing at a SNF, she required assistance with ADL/IADL. Pt reports she was working on standing with her therapists. She reports she was able to stand for about . Prior to SNF admission, pt was independent with ADL/IADL and functional mobility. Pt currently requires supervision for bed mobility, setup assistance for grooming while seated EOB. Pt attempted sit<>stand this date x5 was unable to clear buttocks from bed. Pt is highly motivated to progress toward prior level of independence and return home. Due to decline in current level of function, pt would benefit from acute OT to address established goals to facilitate safe D/C to venue listed below. At this time, recommend SNF follow-up, pt is agreeable. Will continue to follow acutely.     Follow Up Recommendations  SNF    Equipment Recommendations  Other (comment) (TBD at SNF)    Recommendations for Other Services       Precautions / Restrictions Precautions Precautions: Fall Precaution Comments: L knee pain Restrictions Weight Bearing Restrictions: No      Mobility Bed Mobility Overal bed mobility: Needs Assistance Bed Mobility: Supine to Sit;Sit to Supine     Supine to sit: Supervision Sit to supine: Supervision   General bed mobility comments: supervision for safety, pt utilized  bed rails    Transfers Overall transfer level: Needs assistance   Transfers: Sit to/from Stand;Lateral/Scoot Transfers          Lateral/Scoot Transfers: Min guard General transfer comment: attempted sit<>stand from EOB using back of recliner for pt to pull up on, pt unable to clear buttocks off EOB x5. Pt able to lateral scoot LandR with minguard assistance    Balance Overall balance assessment: Needs assistance Sitting-balance support: Feet supported Sitting balance-[name]: Fair Sitting balance - Comments: static sitting and weight shifting supervision at most no LOB       Standing balance comment: pt unable                           ADL either performed or assessed with clinical judgement   ADL Overall ADL's : Needs assistance/impaired Eating/Feeding: Set up;Independent;Sitting   Grooming: Set up;Sitting   Upper Body Bathing: Set up;Sitting   Lower Body Bathing: Maximal assistance   Upper Body Dressing : Min guard;Sitting   Lower Body Dressing: Maximal assistance;Sitting/lateral leans     Toilet Transfer Details (indicate cue type and reason): attempted sit<>stand from EOB pt unable to clear buttocks from bed Toileting- Clothing Manipulation and Hygiene: Total assistance;Bed level         General ADL Comments: pt tolerated sitting EOB with good balance for . She attempted x5 sit<>stand, unable to clear buttocks     Vision Baseline Vision/History: Wears glasses Wears Glasses: At all times Patient Visual Report: No  change from baseline       Perception     Praxis      Pertinent Vitals/Pain Pain Assessment: 0-10 Pain Score: 4  Pain Location: L knee>hip with mobility Pain Descriptors / Indicators: Grimacing;Guarding;Discomfort Pain Intervention(s): Limited activity within patient's tolerance;Monitored during session     Hand Dominance Right   Extremity/Trunk Assessment Upper Extremity Assessment Upper Extremity Assessment:  Generalized weakness   Lower Extremity Assessment Lower Extremity Assessment: Defer to PT evaluation   Cervical / Trunk Assessment Cervical / Trunk Assessment: Normal   Communication Communication Communication: No difficulties   Cognition Arousal/Alertness: Awake/alert Behavior During Therapy: WFL for tasks assessed/performed Overall Cognitive Status: Within Functional Limits for tasks assessed                                 General Comments: appears depressed and at times tearful but motivated to participate   General Comments  HR up to 127bpm following lateral scoot transfer    Exercises     Shoulder Instructions      Home Living Family/patient expects to be discharged to:: Skilled nursing facility Living Arrangements: Alone Available Help at Discharge: Family;Available PRN/intermittently Type of Home: Apartment Home Access: Level entry     Home Layout: One level     Bathroom Shower/Tub: Chief Strategy Officer: Standard     Home Equipment: Grab bars - tub/shower          Prior Functioning/Environment Level of Independence: Needs assistance  Gait / Transfers Assistance Needed: pt reports she was able to stand for <30min, was not walking while at SNF, was not able to stand pivot to surfaces ADL's / Homemaking Assistance Needed: Pt reports she was having assistance with all ADL. Pt was not getting to [facility] at SNF, she was wearing diapers   Comments: prior to previous SNF admission pt was independent with ADL/IADL and self-care        OT Problem List: Decreased strength;Impaired balance (sitting and/or standing);Pain;Decreased activity tolerance;Decreased knowledge of use of DME or AE      OT Treatment/Interventions: Self-care/ADL training;DME and/or AE instruction;Therapeutic activities;Therapeutic exercise;Patient/family education    OT Goals(Current goals can be found in the care plan section) Acute Rehab OT Goals Patient Stated  Goal: decrease pain OT Goal Formulation: With patient Time For Goal Achievement: 12/29/20 Potential to Achieve Goals: Good ADL Goals Pt Will Perform Grooming: with modified independence;sitting Pt Will Perform Lower Body Dressing: with supervision;sitting/lateral leans Pt Will Transfer to Toilet: with max assist;with mod assist;stand pivot transfer;bedside commode  OT Frequency: Min 2X/week   Barriers to D/C:            Co-evaluation              AM-PAC OT "6 Clicks" Daily Activity     Outcome Measure Help from another person eating meals?: None Help from another person taking care of personal grooming?: A Little Help from another person toileting, which includes using toliet, bedpan, or urinal?: Total Help from another person bathing (including washing, rinsing, drying)?: A Lot Help from another person to put on and taking off regular upper body clothing?: A Little Help from another person to put on and taking off regular lower body clothing?: Total 6 Click Score: 14   End of Session Equipment Utilized During Treatment: Gait belt;Rolling walker Nurse Communication: Mobility status  Activity Tolerance: Patient limited by fatigue Patient left: in bed;with call bell/phone  within reach  OT Visit Diagnosis: Other abnormalities of gait and mobility (R26.89);History of falling (Z91.81);Muscle weakness (generalized) (M62.81);Pain Pain - Right/Left:  (bilaterally) Pain - part of body: Knee                Time: 1202-1226 OT Time Calculation (min): 24 min Charges:  OT General Charges $OT Visit: 1 Visit OT Evaluation $OT Eval Moderate Complexity: 1 Mod OT Treatments $Self Care/Home Management : 8-22 mins  Rosey Bath OTR/L Acute Rehabilitation Services Office: [phone]   Rebeca Alert 12/15/2020, 1:00 PM

## 2020-12-16 ENCOUNTER — Encounter (HOSPITAL_COMMUNITY): Payer: Self-pay | Admitting: Internal Medicine

## 2020-12-16 DIAGNOSIS — K922 Gastrointestinal hemorrhage, unspecified: Secondary | ICD-10-CM | POA: Diagnosis not present

## 2020-12-16 LAB — CBC
HCT: 32.3 % — ABNORMAL LOW (ref 36.0–46.0)
Hemoglobin: 10 g/dL — ABNORMAL LOW (ref 12.0–15.0)
MCH: 25.6 pg — ABNORMAL LOW (ref 26.0–34.0)
MCHC: 31 g/dL (ref 30.0–36.0)
MCV: 82.8 fL (ref 80.0–100.0)
Platelets: 260 10*3/uL (ref 150–400)
RBC: 3.9 MIL/uL (ref 3.87–5.11)
RDW: 15.8 % — ABNORMAL HIGH (ref 11.5–15.5)
WBC: 9.9 10*3/uL (ref 4.0–10.5)
nRBC: 0 % (ref 0.0–0.2)

## 2020-12-16 LAB — BASIC METABOLIC PANEL
Anion gap: 6 (ref 5–15)
BUN: 13 mg/dL (ref 8–23)
CO2: 28 mmol/L (ref 22–32)
Calcium: 8.6 mg/dL — ABNORMAL LOW (ref 8.9–10.3)
Chloride: 102 mmol/L (ref 98–111)
Creatinine, Ser: 1.37 mg/dL — ABNORMAL HIGH (ref 0.44–1.00)
GFR, Estimated: 43 mL/min — ABNORMAL LOW (ref >60)
Glucose, Bld: 220 mg/dL — ABNORMAL HIGH (ref 70–99)
Potassium: 3.4 mmol/L — ABNORMAL LOW (ref 3.5–5.1)
Sodium: 136 mmol/L (ref 135–145)

## 2020-12-16 LAB — RESP PANEL BY RT-PCR (FLU A&B, COVID) ARPGX2
Influenza A by PCR: NEGATIVE
Influenza B by PCR: NEGATIVE
SARS Coronavirus 2 by RT PCR: NEGATIVE

## 2020-12-16 LAB — GLUCOSE, CAPILLARY: Glucose-Capillary: 208 mg/dL — ABNORMAL HIGH (ref 70–99)

## 2020-12-16 MED ORDER — POLYETHYLENE GLYCOL 3350 17 G PO PACK: 17.0000 g | PACK | Freq: Every day | ORAL | 0 refills | Status: DC

## 2020-12-16 MED ORDER — POTASSIUM CHLORIDE CRYS ER 20 MEQ PO TBCR
40.0000 meq | EXTENDED_RELEASE_TABLET | Freq: Once | ORAL | Status: AC
  Administered 2020-12-16: 40 meq via ORAL
  Filled 2020-12-16: qty 2

## 2020-12-16 MED ORDER — APIXABAN 5 MG PO TABS: 5.0000 mg | ORAL_TABLET | Freq: Every day | ORAL | Status: DC

## 2020-12-16 NOTE — Progress Notes (Signed)
Attempted to call report to [facility] x2. No answer. Patient being transported via [facility].

## 2020-12-16 NOTE — Discharge Summary (Addendum)
Physician Discharge Summary  Pamela GooLauren E Ravan ZOX:[id number] DOB: July 01, 1955 DOA: 12/13/2020  PCP: Corwin LevinsJohn, James W, MD  Admit date: 12/13/2020 Discharge date: 12/16/2020  Time spent: 35 minutes  Recommendations for Outpatient Follow-up:  Resume Eliquis in 2days 7/12 Titrate laxatives for 1-2BMs/day SNF for Rehab Needs orthopedic referral for chronic L hip and knee OA   Discharge Diagnoses:  Principal Problem:   Acute lower GI bleeding Active Problems:   Essential hypertension   CKD (chronic kidney disease) stage 3, GFR 30-59 ml/min (HCC)   Hyperlipidemia   Chronic atrial fibrillation (HCC)   Obesity, Class III, BMI 40-49.9 (morbid obesity) (HCC)   Controlled type 2 diabetes mellitus with hyperglycemia (HCC)   Rectal ulcer Chronic diastolic CHF   Discharge Condition: Stable  Diet recommendation: Low-sodium, carb modified  Filed Weights   12/14/20 1143 12/14/20 1515  Weight: (!) 158.8 kg (!) 158.8 kg    History of present illness:  Pamela [name] is a 65 y.o. female history of atrial fibrillation on Xarelto, obesity, depression, anxiety, type 2 diabetes mellitus, chronic diastolic CHF was sent to the ED from SNF for rectal bleeding, she was recently hospitalized from 6/11 through 6/18 for acute kidney injury on CKD 3a which was multifactorial from rhabdomyolysis diuretics and ARB she also had ongoing issues with her left knee and pain causing debility and was [facility].  On 7/7 had multiple episodes of BRBPR with clots, sent to the ED -Hgb currently 12.  [facility] wass consulted, never had a colonoscopy.  Hospital Course:   Acute lower GI bleeding -appears to have resolved, [facility] consulted -Transfused 1 unit of PRBC for profuse active bleeding -Colonoscopy noted with stercoral ulcer, no active bleeding, recommended MiraLAX at discharge -Hemoglobin relatively stable, restarted Eliquis in 2 days -[facility]  [facility]hab   Severe hypokalemia -Like likely from diarrhea, bowel prep, replaced   CKD 3a -Creatinine relatively stable   Chronic diastolic CHF -Clinically compensated, discontinued IV fluids, lasix resumed at discharge -Echo with preserved EF in 08/2017   Type 2 diabetes mellitus -Glucotrol and Actos resumed   Hypertension -Continue Cardizem, Toprol   Paroxysmal atrial fibrillation -Recently switched from Xarelto to Eliquis, currently on hold in the setting of lower GI bleed -Continue Cardizem, metoprolol   Morbid obesity -BMI is 47.6   Debility/deconditioning Left knee and left hip pain, history of osteoarthritis -PT eval  -will need referral to orthopedics -Discharge back to SNF for rehabilitation   DVT prophylaxis: SCDs Code Status: DNR   Discharge Exam: Vitals:   12/16/20 0443 12/16/20 0855  BP: 111/63 (!) 111/59  Pulse: 95   Resp: 18   Temp: 98.1 F (36.7 C)   SpO2: 95%     General: AAOx3 Cardiovascular: S1S2/RRR Respiratory: CTAB  Discharge Instructions   Discharge Instructions     Diet - low sodium heart healthy   Complete by: As directed    Diet Carb Modified   Complete by: As directed    Discharge wound care:   Complete by: As directed    routine   Increase activity slowly   Complete by: As directed       Allergies as of 12/16/2020       Reactions   Cymbalta [duloxetine Hcl] Other (See Comments)   "loopiness"   Gabapentin Itching        Medication List     TAKE these medications    acetaminophen 325 MG tablet Commonly  known as: TYLENOL Take 2 tablets (650 mg total) by mouth every 6 (six) hours as needed for mild pain (or Fever >/= 101).   albuterol 108 (90 Base) MCG/ACT inhaler Commonly known as: VENTOLIN HFA Inhale 2 puffs into the lungs every 6 (six) hours as needed for wheezing or shortness of breath.   apixaban 5 MG Tabs tablet Commonly known as: Eliquis Take 1 tablet (5 mg total) by mouth daily. Start taking on: December 18, 2020 What changed: These instructions start on December 18, 2020. If you are unsure what to do until then, ask your doctor or other care provider.   Fish Oil 1000 MG Caps Take 2 capsules (2,000 mg total) by mouth 2 (two) times daily.   furosemide 40 MG tablet Commonly known as: LASIX Take 1 tablet (40 mg total) by mouth daily. Needs appointment for further refills   metoprolol succinate 100 MG 24 hr tablet Commonly known as: TOPROL-XL Take 1 tablet (100 mg total) by mouth daily. Take with or immediately following a meal.   pioglitazone 30 MG tablet Commonly known as: Actos Take 1 tablet (30 mg total) by mouth daily.   polyethylene glycol 17 g packet Commonly known as: MIRALAX / GLYCOLAX Take 17 g by mouth daily.   senna-docusate 8.6-50 MG tablet Commonly known as: Senokot-S Take 1 tablet by mouth 2 (two) times daily as needed for moderate constipation.       ASK your doctor about these medications    atorvastatin 40 MG tablet Commonly known as: LIPITOR TAKE 1 TABLET(40 MG) BY MOUTH DAILY   cetirizine 10 MG tablet Commonly known as: ZYRTEC 1 tab by mouth once daily as needed   diltiazem 360 MG 24 hr capsule Commonly known as: CARDIZEM CD TAKE 1 CAPSULE(360 MG) BY MOUTH DAILY   empagliflozin 25 MG Tabs tablet Commonly known as: Jardiance TAKE 1 TABLET BY MOUTH DAILY.   glipiZIDE 10 MG 24 hr tablet Commonly known as: GLUCOTROL XL 1 tab by mouth once daily   meclizine 12.5 MG tablet Commonly known as: ANTIVERT TAKE 1 TABLET(12.5 MG) BY MOUTH THREE TIMES DAILY AS NEEDED FOR DIZZINESS   pregabalin 50 MG capsule Commonly known as: LYRICA TAKE 1 CAPSULE(50 MG) BY MOUTH THREE TIMES DAILY               Discharge Care Instructions  (From admission, onward)           Start     Ordered   12/16/20 0000  Discharge wound care:       Comments: routine   12/16/20 1056           Allergies  Allergen Reactions   Cymbalta [Duloxetine Hcl] Other (See  Comments)    "loopiness"   Gabapentin Itching    Follow-up Information     Corwin Levins, MD. Schedule an appointment as soon as possible for a visit in 1 week(s).   Specialties: Internal Medicine, Radiology Contact information: [address] [place] [address] [phone]                  The results of significant diagnostics from this hospitalization (including imaging, microbiology, ancillary and laboratory) are listed below for reference.    Significant Diagnostic Studies: CT ABDOMEN PELVIS WO CONTRAST  Result Date: 11/18/2020 CLINICAL DATA:  Right lower quadrant abdominal pain EXAM: CT ABDOMEN AND PELVIS WITHOUT CONTRAST TECHNIQUE: Multidetector CT imaging of the abdomen and pelvis was performed following the standard protocol without IV  contrast. COMPARISON:  None. FINDINGS: Lower chest: Cardiomegaly.  No acute abnormality. Hepatobiliary: No focal hepatic abnormality. Gallbladder unremarkable. Pancreas: No focal abnormality or ductal dilatation. Spleen: No focal abnormality.  Normal size. Adrenals/Urinary Tract: Prominent soft tissue area in the midpole of the right kidney measures 2.5 cm and cannot be characterized on this noncontrast study. No hydronephrosis. No renal or ureteral stones. Adrenal glands and urinary bladder unremarkable. Stomach/Bowel: Diffuse colonic diverticulosis. No active diverticulitis. Stomach and small bowel decompressed, unremarkable. Vascular/Lymphatic: Aortic atherosclerosis. No evidence of aneurysm or adenopathy. Reproductive: Uterus and adnexa unremarkable.  No mass. Other: No free fluid or free air. Musculoskeletal: No acute bony abnormality. IMPRESSION: Colonic diverticulosis.  No active diverticulitis. Cardiomegaly. Prominent soft tissue area in the parapelvic midpole of the right kidney, difficult to characterize on this unenhanced study. This could be further evaluated with contrast-enhanced CT or ultrasound. No acute findings in the  abdomen or pelvis. Electronically Signed   By: Charlett Nose M.D.   On: 11/18/2020 01:10   DG Lumbar Spine Complete  Result Date: 11/18/2020 CLINICAL DATA:  Left hip pain radiating to the left knee EXAM: LUMBAR SPINE - COMPLETE 4+ VIEW COMPARISON:  None. FINDINGS: Normal alignment. Diffuse degenerative disc and facet disease. No fracture. SI joints symmetric and unremarkable. IMPRESSION: Diffuse degenerative disc and facet disease. Electronically Signed   By: Charlett Nose M.D.   On: 11/18/2020 00:17   DG Knee Complete 4 Views Left  Result Date: 11/18/2020 CLINICAL DATA:  Pain EXAM: LEFT KNEE - COMPLETE 4+ VIEW COMPARISON:  None. FINDINGS: Advanced tricompartment degenerative changes most pronounced in the medial and patellofemoral compartments. No joint effusion. No acute bony abnormality. Specifically, no fracture, subluxation, or dislocation. IMPRESSION: Advanced tricompartment degenerative changes. No acute bony abnormality. Electronically Signed   By: Charlett Nose M.D.   On: 11/18/2020 00:16   DG Hip Unilat W or Wo Pelvis 2-3 Views Left  Result Date: 11/18/2020 CLINICAL DATA:  Left hip pain radiating to left knee. EXAM: DG HIP (WITH OR WITHOUT PELVIS) 2-3V LEFT COMPARISON:  None. FINDINGS: Degenerative changes in the hips bilaterally. No acute bony abnormality. Specifically, no fracture, subluxation, or dislocation. IMPRESSION: No acute bony abnormality. Electronically Signed   By: Charlett Nose M.D.   On: 11/18/2020 00:16    Microbiology: Recent Results (from the past 240 hour(s))  Resp Panel by RT-PCR (Flu A&B, Covid) Nasopharyngeal Swab     Status: None   Collection Time: 12/13/20  8:31 AM   Specimen: Nasopharyngeal Swab; Nasopharyngeal(NP) swabs in vial transport medium  Result Value Ref Range Status   SARS Coronavirus 2 by RT PCR NEGATIVE NEGATIVE Final    Comment: (NOTE) SARS-CoV-2 target nucleic acids are NOT DETECTED.  The SARS-CoV-2 RNA is generally detectable in upper  respiratory specimens during the acute phase of infection. The lowest concentration of SARS-CoV-2 viral copies this assay can detect is 138 copies/mL. A negative result does not preclude SARS-Cov-2 infection and should not be used as the sole basis for treatment or other patient management decisions. A negative result may occur with  improper specimen collection/handling, submission of specimen other than nasopharyngeal swab, presence of viral mutation(s) within the areas targeted by this assay, and inadequate number of viral copies(<138 copies/mL). A negative result must be combined with clinical observations, patient history, and epidemiological information. The expected result is Negative.  Fact Sheet for Patients:  BloggerCourse.com  Fact Sheet for Healthcare Providers:  SeriousBroker.it  This test is no t yet approved or cleared by  the [employer] and  has been authorized for detection and/or diagnosis of SARS-CoV-2 by FDA under an Emergency Use Authorization (EUA). This EUA will remain  in effect (meaning this test can be used) for the duration of the COVID-19 declaration under Section 564(b)(1) of the Act, 21 U.S.C.section 360bbb-3(b)(1), unless the authorization is terminated  or revoked sooner.       Influenza A by PCR NEGATIVE NEGATIVE Final   Influenza B by PCR NEGATIVE NEGATIVE Final    Comment: (NOTE) The Xpert Xpress SARS-CoV-2/FLU/RSV plus assay is intended as an aid in the diagnosis of influenza from Nasopharyngeal swab specimens and should not be used as a sole basis for treatment. Nasal washings and aspirates are unacceptable for Xpert Xpress SARS-CoV-2/FLU/RSV testing.  Fact Sheet for Patients: BloggerCourse.com  Fact Sheet for Healthcare Providers: SeriousBroker.it  This test is not yet approved or cleared by the [place] FDA and has been  authorized for detection and/or diagnosis of SARS-CoV-2 by FDA under an Emergency Use Authorization (EUA). This EUA will remain in effect (meaning this test can be used) for the duration of the COVID-19 declaration under Section 564(b)(1) of the Act, 21 U.S.C. section 360bbb-3(b)(1), unless the authorization is terminated or revoked.  Performed at [facility], [address] [place], [place] [address]      Labs: Basic Metabolic Panel: Recent Labs  Lab 12/13/20 0831 12/14/20 1039 12/15/20 0512 12/15/20 1954 12/16/20 0330  NA 135 139 135 132* 136  K 3.9 3.3* 2.7* 4.0 3.4*  CL 98 102 99 99 102  CO2 27 24 28 26 28   GLUCOSE 197* 157* 157* 335* 220*  BUN 22 22 15 14 13   CREATININE 1.76* 1.58* 1.31* 1.57* 1.37*  CALCIUM 8.7* 8.4* 8.7* 8.6* 8.6*   Liver Function Tests: Recent Labs  Lab 12/13/20 0831 12/15/20 0512  AST 21 12*  ALT 16 13  ALKPHOS 83 63  BILITOT 1.2 1.0  PROT 6.3* 5.4*  ALBUMIN 2.9* 2.6*   No results for input(s): LIPASE, AMYLASE in the last 168 hours. No results for input(s): AMMONIA in the last 168 hours. CBC: Recent Labs  Lab 12/13/20 0831 12/13/20 1657 12/14/20 1039 12/14/20 1743 12/15/20 0512 12/16/20 0330  WBC 12.3* 10.3 10.1 12.7* 11.0* 9.9  NEUTROABS 9.8*  --   --   --   --   --   HGB 12.9 12.0 11.4* 11.5* 10.6* 10.0*  HCT 41.5 38.1 36.5 35.8* 32.8* 32.3*  MCV 83.0 81.4 82.4 81.9 82.0 82.8  PLT 302 275 270 257 222 260   Cardiac Enzymes: No results for input(s): CKTOTAL, CKMB, CKMBINDEX, TROPONINI in the last 168 hours. BNP: BNP (last 3 results) No results for input(s): BNP in the last 8760 hours.  ProBNP (last 3 results) Recent Labs    03/20/20 1145 06/21/20 1129  PROBNP 125.0* 139.0*    CBG: Recent Labs  Lab 12/14/20 2120 12/15/20 0617 12/15/20 2345 12/16/20 0555  GLUCAP 220* 144* 243* 208*       Signed:  02/15/21 MD.  [facility] 12/16/2020, 11:03 AM

## 2020-12-16 NOTE — Discharge Instructions (Signed)
Information on my medicine - ELIQUIS (apixaban)  This medication education was reviewed with me or my healthcare representative as part of my discharge preparation.    *Eliquis to resume 12/18/20   Why was Eliquis prescribed for you? Eliquis was prescribed for you to reduce the risk of forming blood clots that can cause a stroke if you have a medical condition called atrial fibrillation (a type of irregular heartbeat) OR to reduce the risk of a blood clots forming after orthopedic surgery.  What do You need to know about Eliquis ? Take your Eliquis TWICE DAILY - one tablet in the morning and one tablet in the evening with or without food.  It would be best to take the doses about the same time each day.  If you have difficulty swallowing the tablet whole please discuss with your pharmacist how to take the medication safely.  Take Eliquis exactly as prescribed by your doctor and DO NOT stop taking Eliquis without talking to the doctor who prescribed the medication.  Stopping may increase your risk of developing a new clot or stroke.  Refill your prescription before you run out.  After discharge, you should have regular check-up appointments with your healthcare provider that is prescribing your Eliquis.  In the future your dose may need to be changed if your kidney function or weight changes by a significant amount or as you get older.  What do you do if you miss a dose? If you miss a dose, take it as soon as you remember on the same day and resume taking twice daily.  Do not take more than one dose of ELIQUIS at the same time.  Important Safety Information A possible side effect of Eliquis is bleeding. You should call your healthcare provider right away if you experience any of the following: Bleeding from an injury or your nose that does not stop. Unusual colored urine (red or dark brown) or unusual colored stools (red or black). Unusual bruising for unknown reasons. A serious fall  or if you hit your head (even if there is no bleeding).  Some medicines may interact with Eliquis and might increase your risk of bleeding or clotting while on Eliquis. To help avoid this, consult your healthcare provider or pharmacist prior to using any new prescription or non-prescription medications, including herbals, vitamins, non-steroidal anti-inflammatory drugs (NSAIDs) and supplements.  This website has more information on Eliquis (apixaban): www.FlightPolice.com.cy.

## 2020-12-16 NOTE — NC FL2 (Signed)
[place] MEDICAID FL2 LEVEL OF CARE SCREENING TOOL     IDENTIFICATION  Patient Name: Pamela [name] Birthdate: 11-26-1955 Sex: female Admission Date (Current Location): 12/13/2020  [facility] and [place] Number:  Producer, television/film/video and Address:  [facility] [place], [address] [place], [place] [address]      Provider Number: [id number]  Attending Physician Name and Address:  Zannie Cove, MD  Relative Name and Phone Number:  Devore,Timothy [phone]    Current Level of Care: Hospital Recommended Level of Care: Skilled Nursing Facility Prior Approval Number:    Date Approved/Denied:   PASRR Number: [id number]  Discharge Plan: ICF    Current Diagnoses: Patient Active Problem List   Diagnosis Date Noted   Rectal ulcer    Acute lower GI bleeding 12/13/2020   Chronic diastolic CHF (congestive heart failure) (HCC) 11/24/2020   Non-traumatic rhabdomyolysis 11/24/2020   Controlled type 2 diabetes mellitus with hyperglycemia (HCC) 11/24/2020   ARF (acute renal failure) (HCC) 11/18/2020   Elevated CK 11/18/2020   Acute pain of left knee 11/18/2020   AKI (acute kidney injury) (HCC) 11/18/2020   Chronic diastolic heart failure (HCC) 06/24/2020   Debility 06/21/2020   Vertigo 03/20/2020   SOB (shortness of breath) 03/20/2020   Left shoulder pain 02/27/2019   Acute upper respiratory infection 09/07/2018   Low back pain 12/01/2017   Diabetic foot ulcer (HCC) 03/03/2017   Charcot foot due to diabetes mellitus (HCC) 02/28/2017   Diabetic ulcer of toe of left foot associated with type 2 diabetes mellitus, limited to breakdown of skin (HCC) 02/28/2017   Obesity, Class III, BMI 40-49.9 (morbid obesity) (HCC) 02/28/2017   Chronic atrial fibrillation (HCC) 12/08/2015   Navicular fracture of ankle with malunion 05/30/2015   Neuropathic pain of foot 05/30/2015   Navicular fracture, foot 05/02/2015   Right ankle pain 04/19/2015   Cough 11/17/2014   Fatigue  09/25/2014   Ganglion cyst 02/06/2014   Bilateral hand pain 11/29/2012   Anemia, unspecified 04/23/2012   CKD (chronic kidney disease) stage 3, GFR 30-59 ml/min (HCC) 04/23/2012   Hyperlipidemia 04/23/2012   Peripheral neuropathy 01/16/2012   DJD (degenerative joint disease) of knee 04/15/2011   Migraine 04/15/2011   Acute on chronic diastolic heart failure (HCC) 10/28/2010   Rosacea 10/11/2010   Anticoagulated 10/11/2010   Encounter for preventative adult health care exam with abnormal findings 10/06/2010   Anxiety state 12/31/2007   Depression 12/31/2007   Essential hypertension 12/31/2007   ALLERGIC RHINITIS 12/31/2007    Orientation RESPIRATION BLADDER Height & Weight     Self, Time, Situation, Place  Normal Continent Weight: (!) 350 lb (158.8 kg) Height:  6' (182.9 cm)  BEHAVIORAL SYMPTOMS/MOOD NEUROLOGICAL BOWEL NUTRITION STATUS      Continent Diet (See DC orders)  AMBULATORY STATUS COMMUNICATION OF NEEDS Skin   Extensive Assist Verbally                         Personal Care Assistance Level of Assistance  Bathing, Feeding, Dressing Bathing Assistance: Maximum assistance Feeding assistance: Independent Dressing Assistance: Limited assistance     Functional Limitations Info  Sight, Hearing, Speech Sight Info: Impaired Hearing Info: Adequate Speech Info: Adequate    SPECIAL CARE FACTORS FREQUENCY  PT (By licensed PT), OT (By licensed OT)     PT Frequency: 5x weekly OT Frequency: 5x weekly            Contractures Contractures Info: Not  present    Additional Factors Info  Code Status, Allergies Code Status Info: full Allergies Info: cymbalta, gabapentin           Current Medications (12/16/2020):  This is the current hospital active medication list Current Facility-Administered Medications  Medication Dose Route Frequency Provider Last Rate Last Admin   acetaminophen (TYLENOL) tablet 650 mg  650 mg Oral Q6H PRN Hilarie Fredrickson, MD       Or    acetaminophen (TYLENOL) suppository 650 mg  650 mg Rectal Q6H PRN Hilarie Fredrickson, MD       albuterol (PROVENTIL) (2.5 MG/3ML) 0.083% nebulizer solution 2.5 mg  2.5 mg Inhalation Q6H PRN Hilarie Fredrickson, MD       atorvastatin (LIPITOR) tablet 40 mg  40 mg Oral Daily Hilarie Fredrickson, MD   40 mg at 12/16/20 0858   diltiazem (CARDIZEM CD) 24 hr capsule 360 mg  360 mg Oral Daily Hilarie Fredrickson, MD   360 mg at 12/16/20 0857   metoprolol succinate (TOPROL-XL) 24 hr tablet 50 mg  50 mg Oral Daily Hilarie Fredrickson, MD   50 mg at 12/16/20 0858   morphine 2 MG/ML injection 2 mg  2 mg Intravenous Q2H PRN Hilarie Fredrickson, MD       ondansetron [facility]) tablet 4 mg  4 mg Oral Q6H PRN Hilarie Fredrickson, MD       Or   ondansetron [facility]) injection 4 mg  4 mg Intravenous Q6H PRN Hilarie Fredrickson, MD       pregabalin (LYRICA) capsule 50 mg  50 mg Oral TID Hilarie Fredrickson, MD   50 mg at 12/16/20 0857   sodium chloride flush (NS) 0.9 % injection 3 mL  3 mL Intravenous Q12H Hilarie Fredrickson, MD   3 mL at 12/16/20 4097     Discharge Medications: Please see discharge summary for a list of discharge medications.  Relevant Imaging Results:  Relevant Lab Results:   Additional Information [id number]  Annalee Genta, LCSW

## 2020-12-17 ENCOUNTER — Inpatient Hospital Stay (HOSPITAL_COMMUNITY)
Admission: EM | Admit: 2020-12-17 | Discharge: 2020-12-21 | DRG: 378 | Disposition: A | Discharge status: Skilled Nursing Facility | Payer: Managed Care, Other (non HMO) | Attending: Internal Medicine | Admitting: Internal Medicine

## 2020-12-17 ENCOUNTER — Emergency Department (HOSPITAL_COMMUNITY): Payer: Managed Care, Other (non HMO)

## 2020-12-17 DIAGNOSIS — D649 Anemia, unspecified: Secondary | ICD-10-CM

## 2020-12-17 DIAGNOSIS — E1142 Type 2 diabetes mellitus with diabetic polyneuropathy: Secondary | ICD-10-CM | POA: Diagnosis present

## 2020-12-17 DIAGNOSIS — I4821 Permanent atrial fibrillation: Secondary | ICD-10-CM | POA: Diagnosis present

## 2020-12-17 DIAGNOSIS — I959 Hypotension, unspecified: Secondary | ICD-10-CM | POA: Diagnosis present

## 2020-12-17 DIAGNOSIS — Z20822 Contact with and (suspected) exposure to covid-19: Secondary | ICD-10-CM | POA: Diagnosis present

## 2020-12-17 DIAGNOSIS — E785 Hyperlipidemia, unspecified: Secondary | ICD-10-CM | POA: Diagnosis present

## 2020-12-17 DIAGNOSIS — Z7901 Long term (current) use of anticoagulants: Secondary | ICD-10-CM

## 2020-12-17 DIAGNOSIS — N1832 Chronic kidney disease, stage 3b: Secondary | ICD-10-CM | POA: Diagnosis present

## 2020-12-17 DIAGNOSIS — Z7401 Bed confinement status: Secondary | ICD-10-CM

## 2020-12-17 DIAGNOSIS — E1122 Type 2 diabetes mellitus with diabetic chronic kidney disease: Secondary | ICD-10-CM | POA: Diagnosis present

## 2020-12-17 DIAGNOSIS — Z7984 Long term (current) use of oral hypoglycemic drugs: Secondary | ICD-10-CM

## 2020-12-17 DIAGNOSIS — Z833 Family history of diabetes mellitus: Secondary | ICD-10-CM

## 2020-12-17 DIAGNOSIS — K5733 Diverticulitis of large intestine without perforation or abscess with bleeding: Secondary | ICD-10-CM | POA: Diagnosis present

## 2020-12-17 DIAGNOSIS — J309 Allergic rhinitis, unspecified: Secondary | ICD-10-CM | POA: Diagnosis present

## 2020-12-17 DIAGNOSIS — Z888 Allergy status to other drugs, medicaments and biological substances status: Secondary | ICD-10-CM

## 2020-12-17 DIAGNOSIS — D62 Acute posthemorrhagic anemia: Secondary | ICD-10-CM | POA: Diagnosis present

## 2020-12-17 DIAGNOSIS — Z6841 Body Mass Index (BMI) 40.0 and over, adult: Secondary | ICD-10-CM

## 2020-12-17 DIAGNOSIS — R42 Dizziness and giddiness: Secondary | ICD-10-CM | POA: Diagnosis present

## 2020-12-17 DIAGNOSIS — F419 Anxiety disorder, unspecified: Secondary | ICD-10-CM | POA: Diagnosis present

## 2020-12-17 DIAGNOSIS — F32A Depression, unspecified: Secondary | ICD-10-CM | POA: Diagnosis present

## 2020-12-17 DIAGNOSIS — I5032 Chronic diastolic (congestive) heart failure: Secondary | ICD-10-CM | POA: Diagnosis present

## 2020-12-17 DIAGNOSIS — E1165 Type 2 diabetes mellitus with hyperglycemia: Secondary | ICD-10-CM | POA: Diagnosis present

## 2020-12-17 DIAGNOSIS — K922 Gastrointestinal hemorrhage, unspecified: Principal | ICD-10-CM | POA: Diagnosis present

## 2020-12-17 DIAGNOSIS — K626 Ulcer of anus and rectum: Secondary | ICD-10-CM | POA: Diagnosis present

## 2020-12-17 DIAGNOSIS — K625 Hemorrhage of anus and rectum: Secondary | ICD-10-CM

## 2020-12-17 DIAGNOSIS — M25552 Pain in left hip: Secondary | ICD-10-CM | POA: Diagnosis present

## 2020-12-17 DIAGNOSIS — Z79899 Other long term (current) drug therapy: Secondary | ICD-10-CM

## 2020-12-17 DIAGNOSIS — I130 Hypertensive heart and chronic kidney disease with heart failure and stage 1 through stage 4 chronic kidney disease, or unspecified chronic kidney disease: Secondary | ICD-10-CM | POA: Diagnosis present

## 2020-12-17 LAB — COMPREHENSIVE METABOLIC PANEL
ALT: 18 U/L (ref 0–44)
AST: 17 U/L (ref 15–41)
Albumin: 2.6 g/dL — ABNORMAL LOW (ref 3.5–5.0)
Alkaline Phosphatase: 57 U/L (ref 38–126)
Anion gap: 9 (ref 5–15)
BUN: 12 mg/dL (ref 8–23)
CO2: 23 mmol/L (ref 22–32)
Calcium: 8.6 mg/dL — ABNORMAL LOW (ref 8.9–10.3)
Chloride: 102 mmol/L (ref 98–111)
Creatinine, Ser: 1.34 mg/dL — ABNORMAL HIGH (ref 0.44–1.00)
GFR, Estimated: 44 mL/min — ABNORMAL LOW (ref >60)
Glucose, Bld: 246 mg/dL — ABNORMAL HIGH (ref 70–99)
Potassium: 4.3 mmol/L (ref 3.5–5.1)
Sodium: 134 mmol/L — ABNORMAL LOW (ref 135–145)
Total Bilirubin: 0.9 mg/dL (ref 0.3–1.2)
Total Protein: 5.3 g/dL — ABNORMAL LOW (ref 6.5–8.1)

## 2020-12-17 LAB — CBC WITH DIFFERENTIAL/PLATELET
Abs Immature Granulocytes: 0.15 10*3/uL — ABNORMAL HIGH (ref 0.00–0.07)
Basophils Absolute: 0.1 10*3/uL (ref 0.0–0.1)
Basophils Relative: 1 %
Eosinophils Absolute: 0.3 10*3/uL (ref 0.0–0.5)
Eosinophils Relative: 2 %
HCT: 30 % — ABNORMAL LOW (ref 36.0–46.0)
Hemoglobin: 9.1 g/dL — ABNORMAL LOW (ref 12.0–15.0)
Immature Granulocytes: 1 %
Lymphocytes Relative: 11 %
Lymphs Abs: 1.7 10*3/uL (ref 0.7–4.0)
MCH: 26.2 pg (ref 26.0–34.0)
MCHC: 30.3 g/dL (ref 30.0–36.0)
MCV: 86.5 fL (ref 80.0–100.0)
Monocytes Absolute: 0.8 10*3/uL (ref 0.1–1.0)
Monocytes Relative: 5 %
Neutro Abs: 11.8 10*3/uL — ABNORMAL HIGH (ref 1.7–7.7)
Neutrophils Relative %: 80 %
Platelets: 331 10*3/uL (ref 150–400)
RBC: 3.47 MIL/uL — ABNORMAL LOW (ref 3.87–5.11)
RDW: 16.5 % — ABNORMAL HIGH (ref 11.5–15.5)
WBC: 14.8 10*3/uL — ABNORMAL HIGH (ref 4.0–10.5)
nRBC: 0.1 % (ref 0.0–0.2)

## 2020-12-17 LAB — PREPARE RBC (CROSSMATCH)

## 2020-12-17 LAB — POC OCCULT BLOOD, ED: Fecal Occult Bld: POSITIVE — AB

## 2020-12-17 MED ORDER — LACTATED RINGERS IV BOLUS
1000.0000 mL | Freq: Once | INTRAVENOUS | Status: AC
  Administered 2020-12-17: 1000 mL via INTRAVENOUS

## 2020-12-17 MED ORDER — SODIUM CHLORIDE 0.9 % IV SOLN
10.0000 mL/h | Freq: Once | INTRAVENOUS | Status: AC
  Administered 2020-12-17: 10 mL/h via INTRAVENOUS

## 2020-12-17 NOTE — ED Notes (Signed)
Pt placed in trendelenburg to reduce reported lightheadedness. Improvement in BP noted and reduction in symptoms reported.

## 2020-12-17 NOTE — ED Provider Notes (Signed)
[name] Provider Note   CSN: [id number] Arrival date & time: 12/17/20  2055     History Chief Complaint  Patient presents with   GI Bleeding    [name] is a 65 y.o. [name].  HPI  64yoF PMHx T2DM, HLD, HTN, A. fib (previously on Xarelto, switched to Eliquis but DC'd last week), CHF, presenting from [place] for multiple episodes of BRBPR since 1700 today.  At the time of onset, patient had loose stools, and asked her caretaker to examine for blood, which she was told was present.  Episodes occur roughly every few minutes, no associated pain, no modifying factors.  This is in the context of milder bleed last week, for which she was admitted until the 7/10.  Received 1 unit of PRBCs.  She subsequently evaluated by GI, who performed a colonoscopy 3 days ago, which revealed distal rectal ulcer, which patient was told was probably damaged due to recent bout of constipation.  Patient additionally notes some orthostatic lightheadedness with sitting up since arriving at the ER.  She additionally notes that she did not have any bowel movements yesterday.  No further medical concern at this time, occluding fevers, chills, SOB, cough, CP, pedal edema, palpitations, N/V, abdominal pain, dysuria, hematuria, rash, syncope.  History obtained from patient and chart review.     Past Medical History:  Diagnosis Date   ALLERGIC RHINITIS 12/31/2007   ANXIETY 12/31/2007   Atrial fibrillation (HCC)    DEPRESSION 12/31/2007   DIABETES MELLITUS, TYPE II 12/31/2007   Diastolic dysfunction 10/11/2010   Hyperlipidemia 04/23/2012   HYPERTENSION 12/31/2007   Migraine    PNA (pneumonia)    in her 20s   Right knee DJD    Rosacea 10/11/2010    Patient Active Problem List   Diagnosis Date Noted   GI bleed 12/18/2020   Rectal ulcer    Acute lower GI bleeding 12/13/2020   Chronic diastolic CHF (congestive heart failure) (HCC) 11/24/2020   Non-traumatic  rhabdomyolysis 11/24/2020   Controlled type 2 diabetes mellitus with hyperglycemia (HCC) 11/24/2020   ARF (acute renal failure) (HCC) 11/18/2020   Elevated CK 11/18/2020   Acute pain of left knee 11/18/2020   AKI (acute kidney injury) (HCC) 11/18/2020   Chronic diastolic heart failure (HCC) 06/24/2020   Debility 06/21/2020   Vertigo 03/20/2020   SOB (shortness of breath) 03/20/2020   Left shoulder pain 02/27/2019   Acute upper respiratory infection 09/07/2018   Low back pain 12/01/2017   Diabetic foot ulcer (HCC) 03/03/2017   Charcot foot due to diabetes mellitus (HCC) 02/28/2017   Diabetic ulcer of toe of left foot associated with type 2 diabetes mellitus, limited to breakdown of skin (HCC) 02/28/2017   Obesity, Class III, BMI 40-49.9 (morbid obesity) (HCC) 02/28/2017   Chronic atrial fibrillation (HCC) 12/08/2015   Navicular fracture of ankle with malunion 05/30/2015   Neuropathic pain of foot 05/30/2015   Navicular fracture, foot 05/02/2015   Right ankle pain 04/19/2015   Cough 11/17/2014   Fatigue 09/25/2014   Ganglion cyst 02/06/2014   Bilateral hand pain 11/29/2012   Anemia, unspecified 04/23/2012   CKD (chronic kidney disease) stage 3, GFR 30-59 ml/min (HCC) 04/23/2012   Hyperlipidemia 04/23/2012   Peripheral neuropathy 01/16/2012   DJD (degenerative joint disease) of knee 04/15/2011   Migraine 04/15/2011   Acute on chronic diastolic heart failure (HCC) 10/28/2010   Rosacea 10/11/2010   Anticoagulated 10/11/2010   Encounter for preventative adult health  care exam with abnormal findings 10/06/2010   Anxiety state 12/31/2007   Depression 12/31/2007   Essential hypertension 12/31/2007   ALLERGIC RHINITIS 12/31/2007    Past Surgical History:  Procedure Laterality Date   2 D&C     1980s   COLONOSCOPY WITH PROPOFOL N/A 12/14/2020   Procedure: COLONOSCOPY WITH PROPOFOL;  Surgeon: Hilarie Fredrickson, MD;  Location: [name];  Service: Endoscopy;  Laterality: N/A;     OB  History   No obstetric history on file.     Family History  Problem Relation Age of Onset   Arthritis Other    Cancer Other        breast   Heart disease Other    Stroke Other    Mental illness Other        emotional   Diabetes Other     Social History   Tobacco Use   Smoking status: Never   Smokeless tobacco: Never  Substance Use Topics   Alcohol use: Not Currently    Comment: social   Drug use: No    Home Medications Prior to Admission medications   Medication Sig Start Date End Date Taking? Authorizing Provider  acetaminophen (TYLENOL) 325 MG tablet Take 2 tablets (650 mg total) by mouth every 6 (six) hours as needed for mild pain (or Fever >/= 101). 11/24/20  Yes Drema Dallas, MD  albuterol (VENTOLIN HFA) 108 (90 Base) MCG/ACT inhaler Inhale 2 puffs into the lungs every 6 (six) hours as needed for wheezing or shortness of breath. 06/21/20  Yes Corwin Levins, MD  ammonium lactate (LAC-HYDRIN) 12 % lotion Apply 1 application topically in the morning and at bedtime. Apply to feet,ankles and toes   Yes [provider]  apixaban (ELIQUIS) 5 MG TABS tablet Take 1 tablet (5 mg total) by mouth daily. Patient taking differently: Take 5 mg by mouth every evening. 12/18/20  Yes Zannie Cove, MD  atorvastatin (LIPITOR) 40 MG tablet TAKE 1 TABLET(40 MG) BY MOUTH DAILY Patient taking differently: Take 40 mg by mouth daily. 06/21/20  Yes Corwin Levins, MD  cetirizine (ZYRTEC) 10 MG tablet 1 tab by mouth once daily as needed Patient taking differently: Take 10 mg by mouth daily as needed for allergies. 06/21/20  Yes Corwin Levins, MD  diltiazem (CARDIZEM CD) 360 MG 24 hr capsule TAKE 1 CAPSULE(360 MG) BY MOUTH DAILY Patient taking differently: Take 360 mg by mouth daily. 06/21/20  Yes Corwin Levins, MD  empagliflozin (JARDIANCE) 25 MG TABS tablet TAKE 1 TABLET BY MOUTH DAILY. Patient taking differently: Take 25 mg by mouth daily. 06/21/20  Yes Corwin Levins, MD  furosemide  (LASIX) 40 MG tablet Take 1 tablet (40 mg total) by mouth daily. Needs appointment for further refills 09/06/20  Yes Laurey Morale, MD  glipiZIDE (GLUCOTROL XL) 10 MG 24 hr tablet 1 tab by mouth once daily Patient taking differently: Take 10 mg by mouth daily. 06/21/20  Yes Corwin Levins, MD  hydrocortisone (ANUSOL-HC) 2.5 % rectal cream Place 1 application rectally See admin instructions. Apply to anal atrea bid x 5 days for hemorrhoids   Yes [provider]  meclizine (ANTIVERT) 12.5 MG tablet TAKE 1 TABLET(12.5 MG) BY MOUTH THREE TIMES DAILY AS NEEDED FOR DIZZINESS Patient taking differently: Take 12.5 mg by mouth every 8 (eight) hours as needed for dizziness. 09/15/20  Yes Corwin Levins, MD  metoprolol succinate (TOPROL-XL) 100 MG 24 hr tablet Take 1 tablet (100  mg total) by mouth daily. Take with or immediately following a meal. 06/21/20  Yes Corwin Levins, MD  Omega-3 Fatty Acids (FISH OIL) 1000 MG CAPS Take 2 capsules (2,000 mg total) by mouth 2 (two) times daily. 08/19/17  Yes Laurey Morale, MD  pioglitazone (ACTOS) 30 MG tablet Take 1 tablet (30 mg total) by mouth daily. 06/24/20  Yes Corwin Levins, MD  polyethylene glycol (MIRALAX / GLYCOLAX) 17 g packet Take 17 g by mouth daily. 12/16/20  Yes Zannie Cove, MD  pregabalin (LYRICA) 50 MG capsule TAKE 1 CAPSULE(50 MG) BY MOUTH THREE TIMES DAILY Patient taking differently: Take 50 mg by mouth 3 (three) times daily. 06/21/20  Yes Corwin Levins, MD  senna-docusate (SENOKOT-S) 8.6-50 MG tablet Take 1 tablet by mouth 2 (two) times daily as needed for moderate constipation. Patient taking differently: Take 2 tablets by mouth 2 (two) times daily. 11/24/20  Yes Drema Dallas, MD  rivaroxaban (XARELTO) 20 MG TABS tablet Take 1 tablet (20 mg total) by mouth daily with supper. Patient not taking: Reported on 12/13/2020 11/24/20 12/13/20  Drema Dallas, MD    Allergies    Cymbalta [duloxetine hcl] and Gabapentin  Review of Systems   Review  of Systems  All other systems reviewed and are negative.  Physical Exam Updated Vital Signs BP 101/69   Pulse (!) 102   Temp 97.8 F (36.6 C) (Oral)   Resp 17   LMP 09/08/2010 (Exact Date)   SpO2 (!) 89%   Physical Exam Vitals and nursing note reviewed. Exam conducted with a chaperone present.  Constitutional:      Appearance: She is ill-appearing.  HENT:     Head: Normocephalic and atraumatic.  Eyes:     Extraocular Movements: Extraocular movements intact.     Pupils: Pupils are equal, round, and reactive to light.  Cardiovascular:     Rate and Rhythm: Normal rate and regular rhythm.     Heart sounds: No murmur heard.   No friction rub. No gallop.  Pulmonary:     Effort: Pulmonary effort is normal.     Breath sounds: Normal breath sounds. No stridor. No wheezing, rhonchi or rales.  Abdominal:     General: There is no distension.     Palpations: Abdomen is soft.     Tenderness: There is abdominal tenderness (left-sided). There is no guarding or rebound.  Genitourinary:    Comments: On rectal examination, patient has bloodstain on bedding with some bright red blood leaking from anus.  External hemorrhoids visible, do not appear thrombosed. Musculoskeletal:     Cervical back: Normal range of motion and neck supple.     Right lower leg: No edema.     Left lower leg: No edema.  Skin:    General: Skin is warm and dry.     Coloration: Skin is pale.  Neurological:     Mental Status: She is alert and oriented to person, place, and time.  Psychiatric:        Mood and Affect: Mood normal.        Behavior: Behavior normal.    ED Results / Procedures / Treatments   Labs (all labs ordered are listed, but only abnormal results are displayed) Labs Reviewed  CBC WITH DIFFERENTIAL/PLATELET - Abnormal; Notable for the following components:      Result Value   WBC 14.8 (*)    RBC 3.47 (*)    Hemoglobin 9.1 (*)    HCT 30.0 (*)  RDW 16.5 (*)    Neutro Abs 11.8 (*)    Abs  Immature Granulocytes 0.15 (*)    All other components within normal limits  COMPREHENSIVE METABOLIC PANEL - Abnormal; Notable for the following components:   Sodium 134 (*)    Glucose, Bld 246 (*)    Creatinine, Ser 1.34 (*)    Calcium 8.6 (*)    Total Protein 5.3 (*)    Albumin 2.6 (*)    GFR, Estimated 44 (*)    All other components within normal limits  HEMOGLOBIN AND HEMATOCRIT, BLOOD - Abnormal; Notable for the following components:   Hemoglobin 8.9 (*)    HCT 29.3 (*)    All other components within normal limits  POC OCCULT BLOOD, ED - Abnormal; Notable for the following components:   Fecal Occult Bld POSITIVE (*)    All other components within normal limits  CBG MONITORING, ED - Abnormal; Notable for the following components:   Glucose-Capillary 187 (*)    All other components within normal limits  SARS CORONAVIRUS 2 (TAT 6-24 HRS)  PROTIME-INR  MAGNESIUM  PHOSPHORUS  OCCULT BLOOD X 1 CARD TO LAB, STOOL  HEMOGLOBIN AND HEMATOCRIT, BLOOD  HEMOGLOBIN AND HEMATOCRIT, BLOOD  CBC  BASIC METABOLIC PANEL  URINALYSIS, ROUTINE W REFLEX MICROSCOPIC  PREPARE RBC (CROSSMATCH)  TYPE AND SCREEN  PREPARE RBC (CROSSMATCH)    EKG None  Radiology DG Chest Portable 1 View  Result Date: 12/17/2020 CLINICAL DATA:  Decreased left basilar breath sounds, initial encounter EXAM: PORTABLE CHEST 1 VIEW COMPARISON:  03/20/2020 FINDINGS: Cardiac shadow is enlarged but stable. Lungs are well aerated bilaterally. No focal infiltrate or effusion is seen. No bony abnormality is noted. IMPRESSION: No acute abnormality seen. Electronically Signed   By: Alcide Clever M.D.   On: 12/17/2020 22:22    Procedures Procedures   Medications Ordered in ED Medications  insulin aspart (novoLOG) injection 0-9 Units (0 Units Subcutaneous Not Given 12/18/20 0414)  atorvastatin (LIPITOR) tablet 40 mg (has no administration in time range)  pregabalin (LYRICA) capsule 50 mg (has no administration in time range)   senna-docusate (Senokot-S) tablet 1 tablet (has no administration in time range)  acetaminophen (TYLENOL) tablet 650 mg (has no administration in time range)  oxyCODONE (Oxy IR/ROXICODONE) immediate release tablet 5 mg (has no administration in time range)  HYDROmorphone (DILAUDID) injection 0.5 mg (has no administration in time range)  polyethylene glycol (MIRALAX / GLYCOLAX) packet 17 g (has no administration in time range)  albuterol (PROVENTIL) (2.5 MG/3ML) 0.083% nebulizer solution 2.5 mg (has no administration in time range)  lactated ringers infusion ( Intravenous New Bag/Given 12/18/20 0409)  ammonium lactate (LAC-HYDRIN) 12 % lotion 1 application (has no administration in time range)  hydrocortisone (ANUSOL-HC) 2.5 % rectal cream 1 application (has no administration in time range)  0.9 %  sodium chloride infusion (0 mL/hr Intravenous Stopped 12/18/20 0209)  lactated ringers bolus 1,000 mL (0 mLs Intravenous Stopped 12/18/20 0040)    ED Course  I have reviewed the triage vital signs and the nursing notes.  Pertinent labs & imaging results that were available during my care of the patient were reviewed by me and considered in my medical decision making (see chart for details).    MDM Rules/Calculators/A&P                          This is a 65 year old [name] with history of T2DM, HLD, HTN, A. fib (previously  on Xarelto, switched to Eliquis but DC'd last week), CHF, presenting for BRBPR and lightheadedness.  On exam, soft pressures but nontachycardic, pale and ill-appearing with left-sided abdominal tenderness, rectal exam with active bleeding although not brisk.  Initial interventions: 1 L fluids initiated for soft pressures, with 1 unit PRBCs ordered for transfusion after patient was consented.  X2 units emergency blood were ordered, but did not arrive before the aforementioned matched unit, so these were canceled.  DDx included: GI ulcer, gastritis, enteritis, colitis,  diverticulitis, IBD, malignancy, hemorrhoids, anal fissure  All studies independently reviewed by myself, d/w the attending physician, factored into my MDM. -EKG: A. fib 99 bpm, left axis deviation, normal intervals, no acute ischemic changes, essentially unchanged compared to prior from 11/19/2020. -CBCd: WBC 14.8 (9.9), Hgb 9.1 (10.0); prior values from yesterday -Hemoccult positive -Unremarkable: CXR, CMP -Pending: COVID  Presentation most consistent with lower GI bleed, likely again due to stercoral ulcer, with symptoms of anemia.  However, it is possible it is due to an inflammatory etiology as she does have a new leukocytosis.  Otherwise negative colonoscopy few days ago suggesting against IBD, malignancy.  Less likely hemorrhoidal bleed.  No anal fissure appreciated on exam.  Given symptomatic anemia, progressive hemoglobin drop in the context of active GI bleed, but she was hospitalized for the same last week, and necessitation for x1 unit PRBC transfusion, feel the patient requires admission and nonurgent GI evaluation.  Discussed with hospitalist team who agreed to admit the patient.  Plan discussed with patient who understands and agrees.  Patient with soft pressures, but otherwise stable on reevaluation, subsequently admitted.  Final Clinical Impression(s) / ED Diagnoses Final diagnoses:  Lower GI bleed  Orthostatic dizziness    Rx / DC Orders ED Discharge Orders     None         Colvin Carolihandrasekar, Tameyah Koch, MD 12/18/20 [id number]    Linwood DibblesKnapp, Jon, MD 12/18/20 1002

## 2020-12-17 NOTE — ED Triage Notes (Signed)
Pt arrived via [facility] from [place] for cc of GI bleeding. Pt was admitted for same last wednesday and was admitted for treatment including blood transfusion. Bleeding noted again today when changing. Pt pale, reporting slight dizziness, EMS reports stable vital signs. Pt hypotensive on arrival.

## 2020-12-18 ENCOUNTER — Other Ambulatory Visit: Payer: Self-pay

## 2020-12-18 DIAGNOSIS — M25552 Pain in left hip: Secondary | ICD-10-CM | POA: Diagnosis present

## 2020-12-18 DIAGNOSIS — D62 Acute posthemorrhagic anemia: Secondary | ICD-10-CM | POA: Diagnosis present

## 2020-12-18 DIAGNOSIS — E1165 Type 2 diabetes mellitus with hyperglycemia: Secondary | ICD-10-CM | POA: Diagnosis present

## 2020-12-18 DIAGNOSIS — K922 Gastrointestinal hemorrhage, unspecified: Secondary | ICD-10-CM | POA: Diagnosis present

## 2020-12-18 DIAGNOSIS — E1142 Type 2 diabetes mellitus with diabetic polyneuropathy: Secondary | ICD-10-CM | POA: Diagnosis present

## 2020-12-18 DIAGNOSIS — I130 Hypertensive heart and chronic kidney disease with heart failure and stage 1 through stage 4 chronic kidney disease, or unspecified chronic kidney disease: Secondary | ICD-10-CM | POA: Diagnosis present

## 2020-12-18 DIAGNOSIS — Z20822 Contact with and (suspected) exposure to covid-19: Secondary | ICD-10-CM | POA: Diagnosis present

## 2020-12-18 DIAGNOSIS — K5791 Diverticulosis of intestine, part unspecified, without perforation or abscess with bleeding: Secondary | ICD-10-CM

## 2020-12-18 DIAGNOSIS — K5733 Diverticulitis of large intestine without perforation or abscess with bleeding: Secondary | ICD-10-CM | POA: Diagnosis present

## 2020-12-18 DIAGNOSIS — E785 Hyperlipidemia, unspecified: Secondary | ICD-10-CM | POA: Diagnosis present

## 2020-12-18 DIAGNOSIS — F419 Anxiety disorder, unspecified: Secondary | ICD-10-CM | POA: Diagnosis present

## 2020-12-18 DIAGNOSIS — K625 Hemorrhage of anus and rectum: Secondary | ICD-10-CM | POA: Diagnosis not present

## 2020-12-18 DIAGNOSIS — J309 Allergic rhinitis, unspecified: Secondary | ICD-10-CM | POA: Diagnosis present

## 2020-12-18 DIAGNOSIS — I5032 Chronic diastolic (congestive) heart failure: Secondary | ICD-10-CM | POA: Diagnosis present

## 2020-12-18 DIAGNOSIS — N1832 Chronic kidney disease, stage 3b: Secondary | ICD-10-CM | POA: Diagnosis present

## 2020-12-18 DIAGNOSIS — Z833 Family history of diabetes mellitus: Secondary | ICD-10-CM | POA: Diagnosis not present

## 2020-12-18 DIAGNOSIS — F32A Depression, unspecified: Secondary | ICD-10-CM | POA: Diagnosis present

## 2020-12-18 DIAGNOSIS — Z7901 Long term (current) use of anticoagulants: Secondary | ICD-10-CM | POA: Diagnosis not present

## 2020-12-18 DIAGNOSIS — Z6841 Body Mass Index (BMI) 40.0 and over, adult: Secondary | ICD-10-CM | POA: Diagnosis not present

## 2020-12-18 DIAGNOSIS — D649 Anemia, unspecified: Secondary | ICD-10-CM | POA: Diagnosis not present

## 2020-12-18 DIAGNOSIS — E1122 Type 2 diabetes mellitus with diabetic chronic kidney disease: Secondary | ICD-10-CM | POA: Diagnosis present

## 2020-12-18 DIAGNOSIS — I4821 Permanent atrial fibrillation: Secondary | ICD-10-CM | POA: Diagnosis present

## 2020-12-18 DIAGNOSIS — K626 Ulcer of anus and rectum: Secondary | ICD-10-CM | POA: Diagnosis not present

## 2020-12-18 DIAGNOSIS — Z79899 Other long term (current) drug therapy: Secondary | ICD-10-CM | POA: Diagnosis not present

## 2020-12-18 DIAGNOSIS — R42 Dizziness and giddiness: Secondary | ICD-10-CM | POA: Diagnosis present

## 2020-12-18 DIAGNOSIS — I959 Hypotension, unspecified: Secondary | ICD-10-CM | POA: Diagnosis present

## 2020-12-18 LAB — CBC
HCT: 26.2 % — ABNORMAL LOW (ref 36.0–46.0)
Hemoglobin: 7.9 g/dL — ABNORMAL LOW (ref 12.0–15.0)
MCH: 25.8 pg — ABNORMAL LOW (ref 26.0–34.0)
MCHC: 30.2 g/dL (ref 30.0–36.0)
MCV: 85.6 fL (ref 80.0–100.0)
Platelets: 227 10*3/uL (ref 150–400)
RBC: 3.06 MIL/uL — ABNORMAL LOW (ref 3.87–5.11)
RDW: 17.3 % — ABNORMAL HIGH (ref 11.5–15.5)
WBC: 13.4 10*3/uL — ABNORMAL HIGH (ref 4.0–10.5)
nRBC: 0.3 % — ABNORMAL HIGH (ref 0.0–0.2)

## 2020-12-18 LAB — HEMOGLOBIN AND HEMATOCRIT, BLOOD
HCT: 29.3 % — ABNORMAL LOW (ref 36.0–46.0)
HCT: 30.1 % — ABNORMAL LOW (ref 36.0–46.0)
Hemoglobin: 8.9 g/dL — ABNORMAL LOW (ref 12.0–15.0)
Hemoglobin: 8.9 g/dL — ABNORMAL LOW (ref 12.0–15.0)

## 2020-12-18 LAB — BASIC METABOLIC PANEL
Anion gap: 11 (ref 5–15)
BUN: 16 mg/dL (ref 8–23)
CO2: 23 mmol/L (ref 22–32)
Calcium: 8.4 mg/dL — ABNORMAL LOW (ref 8.9–10.3)
Chloride: 101 mmol/L (ref 98–111)
Creatinine, Ser: 1.55 mg/dL — ABNORMAL HIGH (ref 0.44–1.00)
GFR, Estimated: 37 mL/min — ABNORMAL LOW (ref >60)
Glucose, Bld: 259 mg/dL — ABNORMAL HIGH (ref 70–99)
Potassium: 4 mmol/L (ref 3.5–5.1)
Sodium: 135 mmol/L (ref 135–145)

## 2020-12-18 LAB — CBG MONITORING, ED
Glucose-Capillary: 187 mg/dL — ABNORMAL HIGH (ref 70–99)
Glucose-Capillary: 165 mg/dL — ABNORMAL HIGH (ref 70–99)
Glucose-Capillary: 177 mg/dL — ABNORMAL HIGH (ref 70–99)
Glucose-Capillary: 215 mg/dL — ABNORMAL HIGH (ref 70–99)

## 2020-12-18 LAB — PROTIME-INR
INR: 1.2 (ref 0.8–1.2)
Prothrombin Time: 14.8 seconds (ref 11.4–15.2)

## 2020-12-18 LAB — MAGNESIUM: Magnesium: 1.9 mg/dL (ref 1.7–2.4)

## 2020-12-18 LAB — SARS CORONAVIRUS 2 (TAT 6-24 HRS): SARS Coronavirus 2: NEGATIVE

## 2020-12-18 LAB — PHOSPHORUS: Phosphorus: 3.7 mg/dL (ref 2.5–4.6)

## 2020-12-18 LAB — GLUCOSE, CAPILLARY: Glucose-Capillary: 228 mg/dL — ABNORMAL HIGH (ref 70–99)

## 2020-12-18 MED ORDER — OXYCODONE HCL 5 MG PO TABS: 5.0000 mg | ORAL_TABLET | Freq: Four times a day (QID) | ORAL | Status: DC | PRN

## 2020-12-18 MED ORDER — ALBUTEROL SULFATE (2.5 MG/3ML) 0.083% IN NEBU: 2.5000 mg | INHALATION_SOLUTION | Freq: Four times a day (QID) | RESPIRATORY_TRACT | Status: DC | PRN

## 2020-12-18 MED ORDER — AMMONIUM LACTATE 12 % EX LOTN: 1.0000 "application " | TOPICAL_LOTION | CUTANEOUS | Status: DC | PRN

## 2020-12-18 MED ORDER — INSULIN ASPART 100 UNIT/ML IJ SOLN
0.0000 [IU] | INTRAMUSCULAR | Status: DC
  Administered 2020-12-18: 3 [IU] via SUBCUTANEOUS
  Administered 2020-12-18 (×3): 2 [IU] via SUBCUTANEOUS
  Administered 2020-12-18: 3 [IU] via SUBCUTANEOUS
  Administered 2020-12-19 (×2): 2 [IU] via SUBCUTANEOUS
  Administered 2020-12-19: 1 [IU] via SUBCUTANEOUS
  Administered 2020-12-19: 3 [IU] via SUBCUTANEOUS
  Administered 2020-12-19 – 2020-12-20 (×2): 1 [IU] via SUBCUTANEOUS
  Administered 2020-12-20: 3 [IU] via SUBCUTANEOUS
  Administered 2020-12-20: 1 [IU] via SUBCUTANEOUS
  Administered 2020-12-20 – 2020-12-21 (×7): 2 [IU] via SUBCUTANEOUS

## 2020-12-18 MED ORDER — ATORVASTATIN CALCIUM 40 MG PO TABS
40.0000 mg | ORAL_TABLET | Freq: Every day | ORAL | Status: DC
  Administered 2020-12-18 – 2020-12-21 (×4): 40 mg via ORAL
  Filled 2020-12-18 (×4): qty 1

## 2020-12-18 MED ORDER — PREGABALIN 50 MG PO CAPS
50.0000 mg | ORAL_CAPSULE | Freq: Three times a day (TID) | ORAL | Status: DC
  Administered 2020-12-18 – 2020-12-21 (×11): 50 mg via ORAL
  Filled 2020-12-18 (×3): qty 1
  Filled 2020-12-18 (×2): qty 2
  Filled 2020-12-18 (×6): qty 1

## 2020-12-18 MED ORDER — SENNOSIDES-DOCUSATE SODIUM 8.6-50 MG PO TABS: 1.0000 | ORAL_TABLET | Freq: Two times a day (BID) | ORAL | Status: DC | PRN

## 2020-12-18 MED ORDER — LACTATED RINGERS IV SOLN: INTRAVENOUS | Status: DC

## 2020-12-18 MED ORDER — LACTATED RINGERS IV SOLN: INTRAVENOUS | Status: AC

## 2020-12-18 MED ORDER — POLYETHYLENE GLYCOL 3350 17 G PO PACK
17.0000 g | PACK | Freq: Two times a day (BID) | ORAL | Status: DC
  Administered 2020-12-18 – 2020-12-21 (×6): 17 g via ORAL
  Filled 2020-12-18 (×7): qty 1

## 2020-12-18 MED ORDER — ACETAMINOPHEN 325 MG PO TABS: 650.0000 mg | ORAL_TABLET | Freq: Four times a day (QID) | ORAL | Status: DC | PRN

## 2020-12-18 MED ORDER — POLYETHYLENE GLYCOL 3350 17 G PO PACK: 17.0000 g | PACK | Freq: Every day | ORAL | Status: DC | PRN

## 2020-12-18 MED ORDER — HYDROCORTISONE (PERIANAL) 2.5 % EX CREA
1.0000 "application " | TOPICAL_CREAM | Freq: Two times a day (BID) | CUTANEOUS | Status: AC
  Filled 2020-12-18 (×2): qty 28.35

## 2020-12-18 MED ORDER — HYDROMORPHONE HCL 1 MG/ML IJ SOLN: 0.5000 mg | INTRAMUSCULAR | Status: DC | PRN

## 2020-12-18 NOTE — ED Notes (Signed)
Pt cleaned, changed, and repositioned. Large amount of red/dark red clots filled brief.

## 2020-12-18 NOTE — ED Notes (Signed)
Per MD, administer single cross matched unit of blood and not administer two emergency units ordered.

## 2020-12-18 NOTE — Consult Note (Addendum)
Referring Provider:  [facility]         Primary Care Physician:  Corwin Levins, MD Primary Gastroenterologist:  Shon Baton           We were asked to see this patient for:    GI bleed              ASSESSMENT / PLAN:   # 65 yo female with recurrent hematochezia ( x1 yesterday). She had a stercoral ulcer on colonoscopy four days ago during an admission for rectal bleeding. Ulcer was not actively bleeding,  nor did appear to have recently bled but still probably source of bleeding. Diverticular bleed not excluded. --No further bleeding since the episode yesterday though there was red blood in vault on EDP's exam.  --Monitor for now. Hopefully she will not need repeat lower endoscopy for control of bleeding.  --Miralax BID --Clear liquids okay  # Acute blood loss anemia. No improvement in hgb following a unit of blood last night ( 9.1 >> 8.1) but she has also had IV fluids.  --Monitor hgb   #Leukocytosis, reactive in setting of GI bleed?   #Atrial fibrillation, home Eliquis still on hold  # CKD 3b       [facility] Attending   I have taken an interval history, reviewed the chart and examined the patient. I agree with the Advanced Practitioner's note, impression and recommendations.    Seems most likely that she has had some hopefully self-limited bleeding from her stercoral ulcer.  Cannot rule out diverticular bleeding.  Observation overnight is reasonable we will see her again tomorrow if there is no further bleeding she should go back to the nursing home and we will try to come up with an idea of when to restart her Eliquis.  If there is more bleeding she may need a flex sig.   Iva Boop, MD, [facility]  Gastroenterology 12/18/2020 3:41 PM   HPI:                                                                                                                             Chief Complaint:  rectal bleeding  Pamela [name] is a 65 y.o. female with a past medical  history significant for morbid obesity, atrial fibrillation on Eliquis, hyperlipidemia , diabetes, diastolic heart failure, CKD IIIb,  Patient was discharged from the hospital 5 days ago after an admission for painless hematochezia possibly secondary to stercoral ulcer found on inpatient colonoscopy.  CTAP showed severe colonic diverticulosis. She was transfused a unit of PRBCs  .  Inpatient colonoscopy showed a stercoral ulcer.  Twice daily MiraLAX recommended upon discharge to keep stools soft. Bleeding ceased and patient was discharged back to SNF on 12/16/2020  Patient was brought back to ED from SNF last evening for recurrent hematochezia.  Patient says she had a normal BM Monday morning at the SNF. Around 5 pm she had an episode of hematochezia. Bleeding  was painless but she had some rectal discomfort during the BM earlier in the day. She says her stools have been soft even though she is not taking the Miralax prescribed upon discharge. She has been taking a stool softener.  In the ED she was hypotensive, had bright red blood per rectum on exam.  Hemoglobin 9.1, down from 10 the day prior.  She was transfused 1 unit of PRBCs last night, follow-up hemoglobin around 4 AM today was down to 8.9. Patient says she hasn't had any further GI bleeding since that once episode yesterday afternoon. She was supposed to resume Eliqus today, has been off from it since last admission  PREVIOUS ENDOSCOPIC EVALUATIONS / PERTINENT STUDIES   12/14/2020 colonoscopy for rectal bleeding Complete exam, the bowel prep was adequate to identify polyps.  Findings included nonbleeding ulcerated mucosa with no stigmata of recent bleeding in the rectum.  Findings consistent with stercoral l ulcer.  Multiple diverticula in the left colon.  The exam was otherwise normal.  Repeat 10-year colonoscopy for screening was recommended    Past Medical History:  Diagnosis Date  . ALLERGIC RHINITIS 12/31/2007  . ANXIETY 12/31/2007  .  Atrial fibrillation (HCC)   . DEPRESSION 12/31/2007  . DIABETES MELLITUS, TYPE II 12/31/2007  . Diastolic dysfunction 10/11/2010  . Hyperlipidemia 04/23/2012  . HYPERTENSION 12/31/2007  . Migraine   . PNA (pneumonia)    in her 420s  . Right knee DJD   . Rosacea 10/11/2010    Past Surgical History:  Procedure Laterality Date  . 2 D&C     1980s  . COLONOSCOPY WITH PROPOFOL N/A 12/14/2020   Procedure: COLONOSCOPY WITH PROPOFOL;  Surgeon: Hilarie FredricksonPerry, John N, MD;  [facility];  Service: Endoscopy;  Laterality: N/A;    Prior to Admission medications   Medication Sig Start Date End Date Taking? Authorizing Provider  acetaminophen (TYLENOL) 325 MG tablet Take 2 tablets (650 mg total) by mouth every 6 (six) hours as needed for mild pain (or Fever >/= 101). 11/24/20  Yes Drema DallasWoods, Curtis J, MD  albuterol (VENTOLIN HFA) 108 (90 Base) MCG/ACT inhaler Inhale 2 puffs into the lungs every 6 (six) hours as needed for wheezing or shortness of breath. 06/21/20  Yes Corwin LevinsJohn, James W, MD  ammonium lactate (LAC-HYDRIN) 12 % lotion Apply 1 application topically in the morning and at bedtime. Apply to feet,ankles and toes   Yes [provider]  apixaban (ELIQUIS) 5 MG TABS tablet Take 1 tablet (5 mg total) by mouth daily. Patient taking differently: Take 5 mg by mouth every evening. 12/18/20  Yes Zannie CoveJoseph, Preetha, MD  atorvastatin (LIPITOR) 40 MG tablet TAKE 1 TABLET(40 MG) BY MOUTH DAILY Patient taking differently: Take 40 mg by mouth daily. 06/21/20  Yes Corwin LevinsJohn, James W, MD  cetirizine (ZYRTEC) 10 MG tablet 1 tab by mouth once daily as needed Patient taking differently: Take 10 mg by mouth daily as needed for allergies. 06/21/20  Yes Corwin LevinsJohn, James W, MD  diltiazem (CARDIZEM CD) 360 MG 24 hr capsule TAKE 1 CAPSULE(360 MG) BY MOUTH DAILY Patient taking differently: Take 360 mg by mouth daily. 06/21/20  Yes Corwin LevinsJohn, James W, MD  empagliflozin (JARDIANCE) 25 MG TABS tablet TAKE 1 TABLET BY MOUTH DAILY. Patient taking  differently: Take 25 mg by mouth daily. 06/21/20  Yes Corwin LevinsJohn, James W, MD  furosemide (LASIX) 40 MG tablet Take 1 tablet (40 mg total) by mouth daily. Needs appointment for further refills 09/06/20  Yes Laurey MoraleMcLean, Dalton S, MD  glipiZIDE (GLUCOTROL XL) 10 MG 24 hr tablet 1 tab by mouth once daily Patient taking differently: Take 10 mg by mouth daily. 06/21/20  Yes Corwin Levins, MD  hydrocortisone (ANUSOL-HC) 2.5 % rectal cream Place 1 application rectally See admin instructions. Apply to anal atrea bid x 5 days for hemorrhoids   Yes [provider]  meclizine (ANTIVERT) 12.5 MG tablet TAKE 1 TABLET(12.5 MG) BY MOUTH THREE TIMES DAILY AS NEEDED FOR DIZZINESS Patient taking differently: Take 12.5 mg by mouth every 8 (eight) hours as needed for dizziness. 09/15/20  Yes Corwin Levins, MD  metoprolol succinate (TOPROL-XL) 100 MG 24 hr tablet Take 1 tablet (100 mg total) by mouth daily. Take with or immediately following a meal. 06/21/20  Yes Corwin Levins, MD  Omega-3 Fatty Acids (FISH OIL) 1000 MG CAPS Take 2 capsules (2,000 mg total) by mouth 2 (two) times daily. 08/19/17  Yes Laurey Morale, MD  pioglitazone (ACTOS) 30 MG tablet Take 1 tablet (30 mg total) by mouth daily. 06/24/20  Yes Corwin Levins, MD  polyethylene glycol (MIRALAX / GLYCOLAX) 17 g packet Take 17 g by mouth daily. 12/16/20  Yes Zannie Cove, MD  pregabalin (LYRICA) 50 MG capsule TAKE 1 CAPSULE(50 MG) BY MOUTH THREE TIMES DAILY Patient taking differently: Take 50 mg by mouth 3 (three) times daily. 06/21/20  Yes Corwin Levins, MD  senna-docusate (SENOKOT-S) 8.6-50 MG tablet Take 1 tablet by mouth 2 (two) times daily as needed for moderate constipation. Patient taking differently: Take 2 tablets by mouth 2 (two) times daily. 11/24/20  Yes Drema Dallas, MD  rivaroxaban (XARELTO) 20 MG TABS tablet Take 1 tablet (20 mg total) by mouth daily with supper. Patient not taking: Reported on 12/13/2020 11/24/20 12/13/20  Drema Dallas, MD     Current Facility-Administered Medications  Medication Dose Route Frequency Provider Last Rate Last Admin  . acetaminophen (TYLENOL) tablet 650 mg  650 mg Oral Q6H PRN Dow Adolph N, DO      . albuterol (PROVENTIL) (2.5 MG/3ML) 0.083% nebulizer solution 2.5 mg  2.5 mg Inhalation Q6H PRN Dow Adolph N, DO      . ammonium lactate (LAC-HYDRIN) 12 % lotion 1 application  1 application Topical PRN Hall, Carole N, DO      . atorvastatin (LIPITOR) tablet 40 mg  40 mg Oral Daily Hall, Carole N, DO      . hydrocortisone (ANUSOL-HC) 2.5 % rectal cream 1 application  1 application Rectal BID Dow Adolph N, DO      . HYDROmorphone (DILAUDID) injection 0.5 mg  0.5 mg Intravenous Q4H PRN Dow Adolph N, DO      . insulin aspart (novoLOG) injection 0-9 Units  0-9 Units Subcutaneous Q4H Dow Adolph N, DO   2 Units at 12/18/20 0802  . lactated ringers infusion   Intravenous Continuous Darlin Drop, DO 75 mL/hr at 12/18/20 0409 New Bag at 12/18/20 0409  . oxyCODONE (Oxy IR/ROXICODONE) immediate release tablet 5 mg  5 mg Oral Q6H PRN Dow Adolph N, DO      . polyethylene glycol (MIRALAX / GLYCOLAX) packet 17 g  17 g Oral Daily PRN Dow Adolph N, DO      . pregabalin (LYRICA) capsule 50 mg  50 mg Oral TID Dow Adolph N, DO      . senna-docusate (Senokot-S) tablet 1 tablet  1 tablet Oral BID PRN Darlin Drop, DO       Current Outpatient Medications  Medication Sig Dispense Refill  . acetaminophen (TYLENOL) 325 MG tablet Take 2 tablets (650 mg total) by mouth every 6 (six) hours as needed for mild pain (or Fever >/= 101). 30 tablet 0  . albuterol (VENTOLIN HFA) 108 (90 Base) MCG/ACT inhaler Inhale 2 puffs into the lungs every 6 (six) hours as needed for wheezing or shortness of breath. 1 each 0  . ammonium lactate (LAC-HYDRIN) 12 % lotion Apply 1 application topically in the morning and at bedtime. Apply to feet,ankles and toes    . apixaban (ELIQUIS) 5 MG TABS tablet Take 1 tablet (5 mg total) by  mouth daily. (Patient taking differently: Take 5 mg by mouth every evening.)    . atorvastatin (LIPITOR) 40 MG tablet TAKE 1 TABLET(40 MG) BY MOUTH DAILY (Patient taking differently: Take 40 mg by mouth daily.) 90 tablet 3  . cetirizine (ZYRTEC) 10 MG tablet 1 tab by mouth once daily as needed (Patient taking differently: Take 10 mg by mouth daily as needed for allergies.) 90 tablet 3  . diltiazem (CARDIZEM CD) 360 MG 24 hr capsule TAKE 1 CAPSULE(360 MG) BY MOUTH DAILY (Patient taking differently: Take 360 mg by mouth daily.) 90 capsule 3  . empagliflozin (JARDIANCE) 25 MG TABS tablet TAKE 1 TABLET BY MOUTH DAILY. (Patient taking differently: Take 25 mg by mouth daily.) 90 tablet 3  . furosemide (LASIX) 40 MG tablet Take 1 tablet (40 mg total) by mouth daily. Needs appointment for further refills 30 tablet 0  . glipiZIDE (GLUCOTROL XL) 10 MG 24 hr tablet 1 tab by mouth once daily (Patient taking differently: Take 10 mg by mouth daily.) 90 tablet 3  . hydrocortisone (ANUSOL-HC) 2.5 % rectal cream Place 1 application rectally See admin instructions. Apply to anal atrea bid x 5 days for hemorrhoids    . meclizine (ANTIVERT) 12.5 MG tablet TAKE 1 TABLET(12.5 MG) BY MOUTH THREE TIMES DAILY AS NEEDED FOR DIZZINESS (Patient taking differently: Take 12.5 mg by mouth every 8 (eight) hours as needed for dizziness.) 60 tablet 0  . metoprolol succinate (TOPROL-XL) 100 MG 24 hr tablet Take 1 tablet (100 mg total) by mouth daily. Take with or immediately following a meal. 90 tablet 3  . Omega-3 Fatty Acids (FISH OIL) 1000 MG CAPS Take 2 capsules (2,000 mg total) by mouth 2 (two) times daily. 60 capsule 0  . pioglitazone (ACTOS) 30 MG tablet Take 1 tablet (30 mg total) by mouth daily. 90 tablet 3  . polyethylene glycol (MIRALAX / GLYCOLAX) 17 g packet Take 17 g by mouth daily. 14 each 0  . pregabalin (LYRICA) 50 MG capsule TAKE 1 CAPSULE(50 MG) BY MOUTH THREE TIMES DAILY (Patient taking differently: Take 50 mg by  mouth 3 (three) times daily.) 270 capsule 1  . senna-docusate (SENOKOT-S) 8.6-50 MG tablet Take 1 tablet by mouth 2 (two) times daily as needed for moderate constipation. (Patient taking differently: Take 2 tablets by mouth 2 (two) times daily.) 30 tablet 0    Allergies as of 12/17/2020 - Review Complete 12/14/2020  Allergen Reaction Noted  . Cymbalta [duloxetine hcl] Other (See Comments) 04/24/2012  . Gabapentin Itching 11/29/2012    Family History  Problem Relation Age of Onset  . Arthritis Other   . Cancer Other        breast  . Heart disease Other   . Stroke Other   . Mental illness Other        emotional  . Diabetes Other  Social History   Socioeconomic History  . Marital status: Single    Spouse name: Not on file  . Number of children: 0  . Years of education: Not on file  . Highest education level: Not on file  Occupational History  . 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 Smoking status: Never  . Smokeless tobacco: Never  Substance and Sexual Activity  . Alcohol use: Not Currently    Comment: social  . Drug use: No  . Sexual activity: Not on file  Other Topics Concern  . Not on file  Social History Narrative  . Not on file   Social Determinants of Health   Financial Resource Strain: Not on file  Food Insecurity: Not on file  Transportation Needs: Not on file  Physical Activity: Not on file  Stress: Not on file  Social Connections: Not on file  Intimate Partner Violence: Not on file    Review of Systems: All systems reviewed and negative except where noted in HPI.   OBJECTIVE:    Physical Exam: Vital signs in last 24 hours: Temp:  [97.6 F (36.4 C)-98.4 F (36.9 C)] 97.6 F (36.4 C) (07/12 0752) Pulse Rate:  [71-106] 99 (07/12 0815) Resp:  [14-23] 17 (07/12 0815) BP: (66-122)/(47-110) 110/63 (07/12 0815) SpO2:  [89 %-98 %] 92 % (07/12 0815)   General:   Alert,  obese, female in NAD Psych:   Pleasant, cooperative. Normal mood and affect. Eyes:  Pupils equal, sclera clear, no icterus.   Conjunctiva pink. Ears:  Normal auditory acuity. Nose:  No deformity, discharge,  or lesions. Neck:  Supple; no masses Lungs:  Clear throughout to auscultation.   No wheezes, crackles, or rhonchi.  Heart:  Regular rate ,  no lower extremity edema Abdomen:  Soft, non-distended, nontender, BS active, no palp mass   Rectal:  Deferred  Msk:  Symmetrical without gross deformities. . Neurologic:  Alert and  oriented x4;  grossly normal neurologically. Skin:  Intact without significant lesions or rashes.  Scheduled inpatient medications . atorvastatin  40 mg Oral Daily  . hydrocortisone  1 application Rectal BID  . insulin aspart  0-9 Units Subcutaneous Q4H  . pregabalin  50 mg Oral TID      Intake/Output from previous day: 07/11 0701 - 07/12 0700 In: 1518 [I.V.:200; Blood:318; IV Piggyback:1000] Out: -  Intake/Output this shift: No intake/output data recorded.   Lab Results: Recent Labs    12/16/20 0330 12/17/20 2146 12/18/20 0341  WBC 9.9 14.8*  --   HGB 10.0* 9.1* 8.9*  HCT 32.3* 30.0* 29.3*  PLT 260 331  --    BMET Recent Labs    12/15/20 1954 12/16/20 0330 12/17/20 2146  NA 132* 136 134*  K 4.0 3.4* 4.3  CL 99 102 102  CO2 GLUCOSE 335* 220* 246*  BUN CREATININE 1.57* 1.37* 1.34*  CALCIUM 8.6* 8.6* 8.6*   LFT Recent Labs    12/17/20 2146  PROT 5.3*  ALBUMIN 2.6*  AST 17  ALT 18  ALKPHOS 57  BILITOT 0.9   PT/INR Recent Labs    12/18/20 0341  LABPROT 14.8  INR 1.2   Hepatitis Panel No results for input(s): HEPBSAG, HCVAB, HEPAIGM, HEPBIGM in the last 72 hours.   . CBC Latest Ref Rng & Units 12/18/2020 12/17/2020 12/16/2020  WBC 4.0 - 10.5 K/uL - 14.8(H) 9.9  Hemoglobin 12.0 -  15.0 g/dL 6.2(I) 2.9(N) 10.0(L)  Hematocrit 36.0 - 46.0 % 29.3(L) 30.0(L) 32.3(L)  Platelets 150 - 400 K/uL - 331 260    . CMP Latest Ref Rng & Units  12/17/2020 12/16/2020 12/15/2020  Glucose 70 - 99 mg/dL 989(Q) 119(E) 174(Y)  BUN 8 - 23 mg/dL 12 13 14   Creatinine 0.44 - 1.00 mg/dL ) 8.14(G) 8.18(H)  Sodium 135 - 145 mmol/L 134(L) 136 132(L)  Potassium 3.5 - 5.1 mmol/L 4.3 3.4(L) 4.0  Chloride 98 - 111 mmol/L 102 102 99  CO2 22 - 32 mmol/L 23 28 26   Calcium 8.9 - 10.3 mg/dL 6.31(S) ) 9.7(W)  Total Protein 6.5 - 8.1 g/dL 5.3(L) - -  Total Bilirubin 0.3 - 1.2 mg/dL 0.9 - -  Alkaline Phos 38 - 126 U/L 57 - -  AST 15 - 41 U/L 17 - -  ALT 0 - 44 U/L 18 - -   Studies/Results: DG Chest Portable 1 View  Result Date: 12/17/2020 CLINICAL DATA:  Decreased left basilar breath sounds, initial encounter EXAM: PORTABLE CHEST 1 VIEW COMPARISON:  03/20/2020 FINDINGS: Cardiac shadow is enlarged but stable. Lungs are well aerated bilaterally. No focal infiltrate or effusion is seen. No bony abnormality is noted. IMPRESSION: No acute abnormality seen. Electronically Signed   By: 02/17/2021 M.D.   On: 12/17/2020 22:22    Active Problems:   GI bleed    Alcide Clever, NP-C @  12/18/2020, 9:16 AM

## 2020-12-18 NOTE — H&P (Signed)
History and Physical  Pamela [name] ZOX:[id number] DOB: January 28, 1956 DOA: 12/17/2020  Referring physician: Ronette Deterhandrasekar, PA- EDP PCP: Corwin LevinsJohn, James W, MD  Outpatient Specialists: GI, cardiology. Patient coming from: SNF.  Chief Complaint: Bright red blood per rectum.  HPI: Pamela [name] is a 65 y.o. female with medical history significant for diverticulosis, distal rectal ulcer, permanent A. fib on Eliquis (last dose 1 week ago, per the patient), chronic diastolic CHF, type 2 diabetes, hypertension, severe morbid obesity, who presented [facility] ED due to multiple loose stools, more than 4, with associative bright red blood per rectum, with onset hours prior to presentation.  Reports some discomfort when passing stools.  Patient was discharged on 12/16/2020 after being admitted for lower GI bleed.  She was seen by GI.  She had a colonoscopy done on 12/14/2020 by Dr. Marina GoodellPerry which showed nonbleeding stercoral ulcer and left colon diverticulosis.  She was advised to repeat screening colonoscopy in 10 years and to keep bowels regular with MiraLAX 1 to twice daily, with goal of 1 or 2 bowel movements per day.  She [facility] the ED [facility] EMS [facility] for further evaluation.  While in the ED, EDP noted BRBPR on rectal exam with positive FOBT.  Drop in Hg on lab studies.  1 unit PRBC ordered to be transfused by EDP.  Blood pressures are soft.  She received 1 L IV fluid bolus LR.  TRH, hospitalist team, was asked to admit.  ED Course:  Temperature 98.4.  BP 102/73, pulse 101, respiratory rate 19, O2 saturation 96% on room air.  Lab studies remarkable for serum sodium 134, potassium 4.3, serum bicarb 23, glucose 246, BUN 12, creatinine 1.34, anion gap 9, GFR 44.  WBC 14.8, hemoglobin 9.1, neutrophil count 11.8.  Platelet count 331.  Chest x-ray unremarkable.  UA pending.  Review of Systems: Review of systems as noted in the HPI. All other systems reviewed and are negative.   Past Medical  History:  Diagnosis Date   ALLERGIC RHINITIS 12/31/2007   ANXIETY 12/31/2007   Atrial fibrillation (HCC)    DEPRESSION 12/31/2007   DIABETES MELLITUS, TYPE II 12/31/2007   Diastolic dysfunction 10/11/2010   Hyperlipidemia 04/23/2012   HYPERTENSION 12/31/2007   Migraine    PNA (pneumonia)    in her 20s   Right knee DJD    Rosacea 10/11/2010   Past Surgical History:  Procedure Laterality Date   2 D&C     1980s   COLONOSCOPY WITH PROPOFOL N/A 12/14/2020   Procedure: COLONOSCOPY WITH PROPOFOL;  Surgeon: Hilarie FredricksonPerry, John N, MD;  [facility];  Service: Endoscopy;  Laterality: N/A;    Social History:  reports that she has never smoked. She has never used smokeless tobacco. She reports previous alcohol use. She reports that she does not use drugs.   Allergies  Allergen Reactions   Cymbalta [Duloxetine Hcl] Other (See Comments)    "loopiness"   Gabapentin Itching    Family History  Problem Relation Age of Onset   Arthritis Other    Cancer Other        breast   Heart disease Other    Stroke Other    Mental illness Other        emotional   Diabetes Other       Prior to Admission medications   Medication Sig Start Date End Date Taking? Authorizing Provider  acetaminophen (TYLENOL) 325 MG tablet Take 2 tablets (650 mg total) by mouth  every 6 (six) hours as needed for mild pain (or Fever >/= 101). 11/24/20   Drema Dallas, MD  albuterol (VENTOLIN HFA) 108 (90 Base) MCG/ACT inhaler Inhale 2 puffs into the lungs every 6 (six) hours as needed for wheezing or shortness of breath. 06/21/20   Corwin Levins, MD  apixaban (ELIQUIS) 5 MG TABS tablet Take 1 tablet (5 mg total) by mouth daily. 12/18/20   Zannie Cove, MD  atorvastatin (LIPITOR) 40 MG tablet TAKE 1 TABLET(40 MG) BY MOUTH DAILY Patient taking differently: Take 40 mg by mouth daily. 06/21/20   Corwin Levins, MD  cetirizine (ZYRTEC) 10 MG tablet 1 tab by mouth once daily as needed Patient taking differently: Take 10 mg by mouth  as needed for allergies. 06/21/20   Corwin Levins, MD  diltiazem (CARDIZEM CD) 360 MG 24 hr capsule TAKE 1 CAPSULE(360 MG) BY MOUTH DAILY Patient taking differently: Take 360 mg by mouth daily. 06/21/20   Corwin Levins, MD  empagliflozin (JARDIANCE) 25 MG TABS tablet TAKE 1 TABLET BY MOUTH DAILY. Patient taking differently: Take 25 mg by mouth daily. 06/21/20   Corwin Levins, MD  furosemide (LASIX) 40 MG tablet Take 1 tablet (40 mg total) by mouth daily. Needs appointment for further refills 09/06/20   Laurey Morale, MD  glipiZIDE (GLUCOTROL XL) 10 MG 24 hr tablet 1 tab by mouth once daily Patient taking differently: Take 10 mg by mouth daily. 06/21/20   Corwin Levins, MD  meclizine (ANTIVERT) 12.5 MG tablet TAKE 1 TABLET(12.5 MG) BY MOUTH THREE TIMES DAILY AS NEEDED FOR DIZZINESS Patient taking differently: Take 12.5 mg by mouth 3 (three) times daily as needed for dizziness. 09/15/20   Corwin Levins, MD  metoprolol succinate (TOPROL-XL) 100 MG 24 hr tablet Take 1 tablet (100 mg total) by mouth daily. Take with or immediately following a meal. 06/21/20   Corwin Levins, MD  Omega-3 Fatty Acids (FISH OIL) 1000 MG CAPS Take 2 capsules (2,000 mg total) by mouth 2 (two) times daily. 08/19/17   Laurey Morale, MD  pioglitazone (ACTOS) 30 MG tablet Take 1 tablet (30 mg total) by mouth daily. 06/24/20   Corwin Levins, MD  polyethylene glycol (MIRALAX / GLYCOLAX) 17 g packet Take 17 g by mouth daily. 12/16/20   Zannie Cove, MD  pregabalin (LYRICA) 50 MG capsule TAKE 1 CAPSULE(50 MG) BY MOUTH THREE TIMES DAILY Patient taking differently: Take 50 mg by mouth 3 (three) times daily. 06/21/20   Corwin Levins, MD  senna-docusate (SENOKOT-S) 8.6-50 MG tablet Take 1 tablet by mouth 2 (two) times daily as needed for moderate constipation. 11/24/20   Drema Dallas, MD  rivaroxaban (XARELTO) 20 MG TABS tablet Take 1 tablet (20 mg total) by mouth daily with supper. Patient not taking: Reported on 12/13/2020 11/24/20 12/13/20   Drema Dallas, MD    Physical Exam: BP 102/73   Pulse (!) 101   Temp 98.4 F (36.9 C) (Oral)   Resp 19   LMP 09/08/2010 (Exact Date)   SpO2 96%   General: 65 y.o. year-old female morbidly obese in no acute distress.  Alert and oriented x3. Cardiovascular: Irregular rate and rhythm with no rubs or gallops.  No thyromegaly or JVD noted.  No lower extremity edema. 2/4 pulses in all 4 extremities. Respiratory: Clear to auscultation with no wheezes or rales. Good inspiratory effort. Abdomen: Soft nontender nondistended with normal bowel sounds x4 quadrants. Muskuloskeletal: No cyanosis,  clubbing or edema noted bilaterally Neuro: CN II-XII intact, strength, sensation, reflexes Skin: No ulcerative lesions noted or rashes Psychiatry: Judgement and insight appear normal. Mood is appropriate for condition and setting          Labs on Admission:  Basic Metabolic Panel: Recent Labs  Lab 12/14/20 1039 12/15/20 0512 12/15/20 1954 12/16/20 0330 12/17/20 2146  NA 139 135 132* 136 134*  K 3.3* 2.7* 4.0 3.4* 4.3  CL 102 99 99 102 102  CO2 24 28 26 28 23   GLUCOSE 157* 157* 335* 220* 246*  BUN 22 15 14 13 12   CREATININE 1.58* 1.31* 1.57* 1.37* 1.34*  CALCIUM 8.4* 8.7* 8.6* 8.6* 8.6*   Liver Function Tests: Recent Labs  Lab 12/13/20 0831 12/15/20 0512 12/17/20 2146  AST 21 12* 17  ALT 16 13 18   ALKPHOS 83 63 57  BILITOT 1.2 1.0 0.9  PROT 6.3* 5.4* 5.3*  ALBUMIN 2.9* 2.6* 2.6*   No results for input(s): LIPASE, AMYLASE in the last 168 hours. No results for input(s): AMMONIA in the last 168 hours. CBC: Recent Labs  Lab 12/13/20 0831 12/13/20 1657 12/14/20 1039 12/14/20 1743 12/15/20 0512 12/16/20 0330 12/17/20 2146  WBC 12.3*   < > 10.1 12.7* 11.0* 9.9 14.8*  NEUTROABS 9.8*  --   --   --   --   --  11.8*  HGB 12.9   < > 11.4* 11.5* 10.6* 10.0* 9.1*  HCT 41.5   < > 36.5 35.8* 32.8* 32.3* 30.0*  MCV 83.0   < > 82.4 81.9 82.0 82.8 86.5  PLT 302   < > 270 257 222 260 331    < > = values in this interval not displayed.   Cardiac Enzymes: No results for input(s): CKTOTAL, CKMB, CKMBINDEX, TROPONINI in the last 168 hours.  BNP (last 3 results) No results for input(s): BNP in the last 8760 hours.  ProBNP (last 3 results) Recent Labs    03/20/20 1145 06/21/20 1129  PROBNP 125.0* 139.0*    CBG: Recent Labs  Lab 12/14/20 2120 12/15/20 0617 12/15/20 2345 12/16/20 0555  GLUCAP 220* 144* 243* 208*    Radiological Exams on Admission: DG Chest Portable 1 View  Result Date: 12/17/2020 CLINICAL DATA:  Decreased left basilar breath sounds, initial encounter EXAM: PORTABLE CHEST 1 VIEW COMPARISON:  03/20/2020 FINDINGS: Cardiac shadow is enlarged but stable. Lungs are well aerated bilaterally. No focal infiltrate or effusion is seen. No bony abnormality is noted. IMPRESSION: No acute abnormality seen. Electronically Signed   By: 02/16/21 M.D.   On: 12/17/2020 22:22    EKG: I independently viewed the EKG done and my findings are as followed: A. fib rate of 99, nonspecific ST-T changes.  QTc 483.  Assessment/Plan Present on Admission:  GI bleed  Active Problems:   GI bleed  Acute lower GI bleed Recent admission for the same Had a colonoscopy done on 12/14/2020 which showed nonbleeding stercoral ulcer and left colon diverticulosis. Drop in hemoglobin 9.1K with bright red blood per rectum on exam and positive FOBT in the ED. Off Eliquis for 1 week, plan was to restart DOAC on 12/18/2020, continue to hold off for now. Obtain INR level Serial H&H every 6 hours Transfuse hemoglobin less than 8.0. 1 unit PRBC has been ordered to be transfused in the ED by EDP.  Leukocytosis, possibly reactive in the setting of active GI bleed Presented with WBC 14.8K Chest x-ray nonacute Obtain urine analysis, follow results Currently afebrile  and nonseptic appearing Repeat CBC in the morning  Permanent A. fib, home Eliquis on hold. Hold off home oral  antihypertensives due to active GI bleed to avoid hypotension. Closely monitor on telemetry Continue to hold off Eliquis for now  CKD 3B Appears to be at her baseline creatinine 1.34 with GFR 44 Avoid nephrotoxic agent, dehydration and hypotension. Started lactated ringer at 75 cc/h x 1 day Closely monitor volume status while on IV fluid Monitor urine output  Type 2 diabetes with hyperglycemia Last hemoglobin A1c 6.9 on 11/26/2020 N.p.o. except sips and meds, ice chips, until seen by GI Insulin sliding scale every 4 hours  Diabetic polyneuropathy Resume home Lyrica  Hyperlipidemia Resume home statin   DVT prophylaxis: SCDs, pharmacological DVT prophylaxis currently contraindicated in the setting of active GI bleed.  Code Status: Full code  Family Communication: None at bedside  Disposition Plan: Admit to telemetry medical unit.  Consults called: Please consult GI in the morning  Admission status: Inpatient status.  Patient will require at least 2 midnights for further evaluation and treatment of present condition.   Status is: Inpatient    Dispo:  Patient From: Home  Planned Disposition: Home, possibly on 12/20/2020 or when symptomatology has improved.  Medically stable for discharge: No         Darlin Drop MD [facility] Pager [phone]  If 7PM-7AM, please contact night-coverage [email or web address] Password [facility]  12/18/2020, 12:33 AM

## 2020-12-18 NOTE — Progress Notes (Signed)
PROGRESS NOTE    Pamela [name]  ZOX:[id number] DOB: Nov 08, 1955 DOA: 12/17/2020 PCP: Corwin LevinsJohn, James W, MD   Chief Complain: Bright red blood per rectum  Brief Narrative: Patient is a [age]M female with history of diverticulosis, distal rectal ulcer, permanent A. fib on Eliquis, chronic diastolic congestive heart failure, type 2 diabetes, hypertension, morbid obesity who presented to the emergency department from skilled nursing facility due to multiple stools associated with bright red blood per rectum and also with some rectal discomfort while passing stool.  She was just discharged from here on 12/16/2020 after admitted for lower GI bleed at that time she had colonoscopy which showed nonbleeding stercoral ulcer and left colonic diverticulitis.  On presentation she was noticed to have bright red blood per rectum on rectal examination, positive FOBT.  Hemoglobin in the range of 9 and presentation, she was given a unit of blood transfusion.  GI consulted.  Assessment & Plan:   Active Problems:   GI bleed  Acute lower GI bleed: She was admitted and was managed for the same recently, discharged on 7/10.  Had a colonoscopy done on 7/8 which showed nonbleeding stercoral ulcer and left colon diverticulitis.  FOBT was positive.  Hemoglobin was noticed to be in the range of 9 on presentation, FOBT positive. She has been off Eliquis for a week, plan was to restart the Eliquis on 7/12, continue to hold now.  As per the report, she was given a unit of blood transfusion in the emergency department.  Hemoglobin currently stable in the range of 9. GI following with no plan for repeating colonoscopy. Continue to monitor H&H Continue MiraLAX twice daily  Paroxysmal A. fib: Currently Eliquis is on hold due to recent GI bleed.  Continue to hold.  Rate is controlled  CKD stage IIIb: Currently kidney function at baseline.  Started on gentle IV fluids.  Check BMP tomorrow  Leukocytosis: Most likely reactive.  No  signs of infection.  Continue to monitor  Diabetes type 2: Recent hemoglobin A1c of 6.9.  Continue sliding scale insulin.  Diabetic polyneuropathy: On Lyrica  History of hyperlipidemia: On statin  Chronic diastolic congestive heart failure: Currently euvolemic.  Hypertension: Currently blood stable on Toprol, Cardizem  Morbid obesity: BMI 47.6  Debility/deconditioning/left knee and left hip pain: Currently staying at the skilled nursing facility.  Continue supportive care          DVT prophylaxis:SCD Code Status: Full Family Communication: None at bedside Status is: Inpatient  Remains inpatient appropriate because:Unsafe d/c plan  Dispo:  Patient From: SNF  Planned Disposition: SNF  Medically stable for discharge: No       Consultants: GI  Procedures:None  Antimicrobials:  Anti-infectives (From admission, onward)    None       Subjective:  Patient seen and examined the bedside this morning.  Hemodynamically stable.  Denies any active rectal bleeding during my evaluation.denies any abdomen pain, nausea or vomiting.   Objective: Vitals:   12/18/20 1030 12/18/20 1045 12/18/20 1100 12/18/20 1115  BP: 101/73  111/73 111/69  Pulse: 95 99 97 88  Resp: 14 15 12 15   Temp:      TempSrc:      SpO2: 94% 95% 96% 93%    Intake/Output Summary (Last 24 hours) at 12/18/2020 1325 Last data filed at 12/18/2020 0209 Gross per 24 hour  Intake 1518 ml  Output --  Net 1518 ml   There were no vitals filed for this visit.  Examination:  General exam: Overall comfortable, not in distress,obese HEENT: PERRL Respiratory system:  no wheezes or crackles  Cardiovascular system: S1 & S2 heard, RRR.  Gastrointestinal system: Abdomen is nondistended, soft and nontender. Central nervous system: Alert and oriented Extremities: No edema, no clubbing ,no cyanosis Skin: No rashes, no ulcers,no icterus      Data Reviewed: I have personally reviewed following labs and  imaging studies  CBC: Recent Labs  Lab 12/13/20 0831 12/13/20 1657 12/14/20 1039 12/14/20 1743 12/15/20 0512 12/16/20 0330 12/17/20 2146 12/18/20 0341 12/18/20 1241  WBC 12.3*   < > 10.1 12.7* 11.0* 9.9 14.8*  --   --   NEUTROABS 9.8*  --   --   --   --   --  11.8*  --   --   HGB 12.9   < > 11.4* 11.5* 10.6* 10.0* 9.1* 8.9* 8.9*  HCT 41.5   < > 36.5 35.8* 32.8* 32.3* 30.0* 29.3* 30.1*  MCV 83.0   < > 82.4 81.9 82.0 82.8 86.5  --   --   PLT 302   < > 270 257 222 260 331  --   --    < > = values in this interval not displayed.   Basic Metabolic Panel: Recent Labs  Lab 12/14/20 1039 12/15/20 0512 12/15/20 1954 12/16/20 0330 12/17/20 2146 12/18/20 0340  NA 139 135 132* 136 134*  --   K 3.3* 2.7* 4.0 3.4* 4.3  --   CL 102 99 99 102 102  --   CO2 --   GLUCOSE 157* 157* 335* 220* 246*  --   BUN --   CREATININE 1.58* 1.31* 1.57* 1.37* 1.34*  --   CALCIUM 8.4* 8.7* 8.6* 8.6* 8.6*  --   MG  --   --   --   --   --  1.9  PHOS  --   --   --   --   --  3.7   GFR: Estimated Creatinine Clearance: 71.9 mL/min (A) (by C-G formula based on SCr of 1.34 mg/dL (H)). Liver Function Tests: Recent Labs  Lab 12/13/20 0831 12/15/20 0512 12/17/20 2146  AST 21 12* 17  ALT ALKPHOS 83 63 57  BILITOT 1.2 1.0 0.9  PROT 6.3* 5.4* 5.3*  ALBUMIN 2.9* 2.6* 2.6*   No results for input(s): LIPASE, AMYLASE in the last 168 hours. No results for input(s): AMMONIA in the last 168 hours. Coagulation Profile: Recent Labs  Lab 12/13/20 0831 12/18/20 0341  INR 1.2 1.2   Cardiac Enzymes: No results for input(s): CKTOTAL, CKMB, CKMBINDEX, TROPONINI in the last 168 hours. BNP (last 3 results) Recent Labs    03/20/20 1145 06/21/20 1129  PROBNP 125.0* 139.0*   HbA1C: No results for input(s): HGBA1C in the last 72 hours. CBG: Recent Labs  Lab 12/15/20 2345 12/16/20 0555 12/18/20 0413 12/18/20 0748 12/18/20 1239  GLUCAP 243* 208* 187* 165* 177*    Lipid Profile: No results for input(s): CHOL, HDL, LDLCALC, TRIG, CHOLHDL, LDLDIRECT in the last 72 hours. Thyroid Function Tests: No results for input(s): TSH, T4TOTAL, FREET4, T3FREE, THYROIDAB in the last 72 hours. Anemia Panel: No results for input(s): VITAMINB12, FOLATE, FERRITIN, TIBC, IRON, RETICCTPCT in the last 72 hours. Sepsis Labs: No results for input(s): PROCALCITON, LATICACIDVEN in the last 168 hours.  Recent Results (from the past 240 hour(s))  Resp Panel by RT-PCR (Flu A&B, Covid) Nasopharyngeal  Swab     Status: None   Collection Time: 12/13/20  8:31 AM   Specimen: Nasopharyngeal Swab; Nasopharyngeal(NP) swabs in vial transport medium  Result Value Ref Range Status   SARS Coronavirus 2 by RT PCR NEGATIVE NEGATIVE Final    Comment: (NOTE) SARS-CoV-2 target nucleic acids are NOT DETECTED.  The SARS-CoV-2 RNA is generally detectable in upper respiratory specimens during the acute phase of infection. The lowest concentration of SARS-CoV-2 viral copies this assay can detect is 138 copies/mL. A negative result does not preclude SARS-Cov-2 infection and should not be used as the sole basis for treatment or other patient management decisions. A negative result may occur with  improper specimen collection/handling, submission of specimen other than nasopharyngeal swab, presence of viral mutation(s) within the areas targeted by this assay, and inadequate number of viral copies(<138 copies/mL). A negative result must be combined with clinical observations, patient history, and epidemiological information. The expected result is Negative.  Fact Sheet for Patients:  BloggerCourse.com  Fact Sheet for Healthcare Providers:  SeriousBroker.it  This test is no t yet approved or cleared by the [place] FDA and  has been authorized for detection and/or diagnosis of SARS-CoV-2 by FDA under an Emergency Use Authorization  (EUA). This EUA will remain  in effect (meaning this test can be used) for the duration of the COVID-19 declaration under Section 564(b)(1) of the Act, 21 U.S.C.section 360bbb-3(b)(1), unless the authorization is terminated  or revoked sooner.       Influenza A by PCR NEGATIVE NEGATIVE Final   Influenza B by PCR NEGATIVE NEGATIVE Final    Comment: (NOTE) The Xpert Xpress SARS-CoV-2/FLU/RSV plus assay is intended as an aid in the diagnosis of influenza from Nasopharyngeal swab specimens and should not be used as a sole basis for treatment. Nasal washings and aspirates are unacceptable for Xpert Xpress SARS-CoV-2/FLU/RSV testing.  Fact Sheet for Patients: BloggerCourse.com  Fact Sheet for Healthcare Providers: SeriousBroker.it  This test is not yet approved or cleared by the [place] FDA and has been authorized for detection and/or diagnosis of SARS-CoV-2 by FDA under an Emergency Use Authorization (EUA). This EUA will remain in effect (meaning this test can be used) for the duration of the COVID-19 declaration under Section 564(b)(1) of the Act, 21 U.S.C. section 360bbb-3(b)(1), unless the authorization is terminated or revoked.  Performed at [facility], [address], [place], [place] [address]   Resp Panel by RT-PCR (Flu A&B, Covid) Nasopharyngeal Swab     Status: None   Collection Time: 12/16/20 10:58 AM   Specimen: Nasopharyngeal Swab; Nasopharyngeal(NP) swabs in vial transport medium  Result Value Ref Range Status   SARS Coronavirus 2 by RT PCR NEGATIVE NEGATIVE Final    Comment: (NOTE) SARS-CoV-2 target nucleic acids are NOT DETECTED.  The SARS-CoV-2 RNA is generally detectable in upper respiratory specimens during the acute phase of infection. The lowest concentration of SARS-CoV-2 viral copies this assay can detect is 138 copies/mL. A negative result does not preclude SARS-Cov-2 infection and should  not be used as the sole basis for treatment or other patient management decisions. A negative result may occur with  improper specimen collection/handling, submission of specimen other than nasopharyngeal swab, presence of viral mutation(s) within the areas targeted by this assay, and inadequate number of viral copies(<138 copies/mL). A negative result must be combined with clinical observations, patient history, and epidemiological information. The expected result is Negative.  Fact Sheet for Patients:  BloggerCourse.com  Fact Sheet  for Healthcare Providers:  SeriousBroker.it  This test is no t yet approved or cleared by the [place] and  has been authorized for detection and/or diagnosis of SARS-CoV-2 by FDA under an Emergency Use Authorization (EUA). This EUA will remain  in effect (meaning this test can be used) for the duration of the COVID-19 declaration under Section 564(b)(1) of the Act, 21 U.S.C.section 360bbb-3(b)(1), unless the authorization is terminated  or revoked sooner.       Influenza A by PCR NEGATIVE NEGATIVE Final   Influenza B by PCR NEGATIVE NEGATIVE Final    Comment: (NOTE) The Xpert Xpress SARS-CoV-2/FLU/RSV plus assay is intended as an aid in the diagnosis of influenza from Nasopharyngeal swab specimens and should not be used as a sole basis for treatment. Nasal washings and aspirates are unacceptable for Xpert Xpress SARS-CoV-2/FLU/RSV testing.  Fact Sheet for Patients: BloggerCourse.com  Fact Sheet for Healthcare Providers: SeriousBroker.it  This test is not yet approved or cleared by the [place] FDA and has been authorized for detection and/or diagnosis of SARS-CoV-2 by FDA under an Emergency Use Authorization (EUA). This EUA will remain in effect (meaning this test can be used) for the duration of the COVID-19 declaration under  Section 564(b)(1) of the Act, 21 U.S.C. section 360bbb-3(b)(1), unless the authorization is terminated or revoked.  Performed at [facility], [address] [place], [place] [address]   SARS CORONAVIRUS 2 (TAT 6-24 HRS) Nasopharyngeal Nasopharyngeal Swab     Status: None   Collection Time: 12/17/20  9:20 PM   Specimen: Nasopharyngeal Swab  Result Value Ref Range Status   SARS Coronavirus 2 NEGATIVE NEGATIVE Final    Comment: (NOTE) SARS-CoV-2 target nucleic acids are NOT DETECTED.  The SARS-CoV-2 RNA is generally detectable in upper and lower respiratory specimens during the acute phase of infection. Negative results do not preclude SARS-CoV-2 infection, do not rule out co-infections with other pathogens, and should not be used as the sole basis for treatment or other patient management decisions. Negative results must be combined with clinical observations, patient history, and epidemiological information. The expected result is Negative.  Fact Sheet for Patients: HairSlick.no  Fact Sheet for Healthcare Providers: quierodirigir.com  This test is not yet approved or cleared by the [place] FDA and  has been authorized for detection and/or diagnosis of SARS-CoV-2 by FDA under an Emergency Use Authorization (EUA). This EUA will remain  in effect (meaning this test can be used) for the duration of the COVID-19 declaration under Se ction 564(b)(1) of the Act, 21 U.S.C. section 360bbb-3(b)(1), unless the authorization is terminated or revoked sooner.  Performed at [facility], [address], [place], [place] [address]          Radiology Studies: DG Chest Portable 1 View  Result Date: 12/17/2020 CLINICAL DATA:  Decreased left basilar breath sounds, initial encounter EXAM: PORTABLE CHEST 1 VIEW COMPARISON:  03/20/2020 FINDINGS: Cardiac shadow is enlarged but stable. Lungs are well aerated bilaterally.  No focal infiltrate or effusion is seen. No bony abnormality is noted. IMPRESSION: No acute abnormality seen. Electronically Signed   By: Alcide Clever M.D.   On: 12/17/2020 22:22        Scheduled Meds:  atorvastatin  40 mg Oral Daily   hydrocortisone  1 application Rectal BID   insulin aspart  0-9 Units Subcutaneous Q4H   polyethylene glycol  17 g Oral BID   pregabalin  50 mg Oral TID   Continuous  Infusions:  lactated ringers 75 mL/hr at 12/18/20 0409     LOS: 0 days    Time spent: 35 mins.More than 50% of that time was spent in counseling and/or coordination of care.      Burnadette Pop, MD [facility] P7/05/2021, 1:25 PM

## 2020-12-19 DIAGNOSIS — K5791 Diverticulosis of intestine, part unspecified, without perforation or abscess with bleeding: Secondary | ICD-10-CM | POA: Diagnosis not present

## 2020-12-19 LAB — GLUCOSE, CAPILLARY
Glucose-Capillary: 114 mg/dL — ABNORMAL HIGH (ref 70–99)
Glucose-Capillary: 143 mg/dL — ABNORMAL HIGH (ref 70–99)
Glucose-Capillary: 145 mg/dL — ABNORMAL HIGH (ref 70–99)
Glucose-Capillary: 163 mg/dL — ABNORMAL HIGH (ref 70–99)
Glucose-Capillary: 178 mg/dL — ABNORMAL HIGH (ref 70–99)
Glucose-Capillary: 187 mg/dL — ABNORMAL HIGH (ref 70–99)
Glucose-Capillary: 219 mg/dL — ABNORMAL HIGH (ref 70–99)
Glucose-Capillary: 242 mg/dL — ABNORMAL HIGH (ref 70–99)

## 2020-12-19 LAB — BASIC METABOLIC PANEL
Anion gap: 4 — ABNORMAL LOW (ref 5–15)
BUN: 11 mg/dL (ref 8–23)
CO2: 28 mmol/L (ref 22–32)
Calcium: 8.1 mg/dL — ABNORMAL LOW (ref 8.9–10.3)
Chloride: 105 mmol/L (ref 98–111)
Creatinine, Ser: 1.24 mg/dL — ABNORMAL HIGH (ref 0.44–1.00)
GFR, Estimated: 49 mL/min — ABNORMAL LOW (ref >60)
Glucose, Bld: 113 mg/dL — ABNORMAL HIGH (ref 70–99)
Potassium: 3.3 mmol/L — ABNORMAL LOW (ref 3.5–5.1)
Sodium: 137 mmol/L (ref 135–145)

## 2020-12-19 LAB — CBC
HCT: 23.6 % — ABNORMAL LOW (ref 36.0–46.0)
Hemoglobin: 7.4 g/dL — ABNORMAL LOW (ref 12.0–15.0)
MCH: 26.3 pg (ref 26.0–34.0)
MCHC: 31.4 g/dL (ref 30.0–36.0)
MCV: 84 fL (ref 80.0–100.0)
Platelets: 232 10*3/uL (ref 150–400)
RBC: 2.81 MIL/uL — ABNORMAL LOW (ref 3.87–5.11)
RDW: 17.2 % — ABNORMAL HIGH (ref 11.5–15.5)
WBC: 10.8 10*3/uL — ABNORMAL HIGH (ref 4.0–10.5)
nRBC: 0.5 % — ABNORMAL HIGH (ref 0.0–0.2)

## 2020-12-19 LAB — HEMOGLOBIN AND HEMATOCRIT, BLOOD
HCT: 24.6 % — ABNORMAL LOW (ref 36.0–46.0)
Hemoglobin: 7.7 g/dL — ABNORMAL LOW (ref 12.0–15.0)

## 2020-12-19 MED ORDER — POTASSIUM CHLORIDE CRYS ER 20 MEQ PO TBCR
40.0000 meq | EXTENDED_RELEASE_TABLET | Freq: Once | ORAL | Status: AC
  Administered 2020-12-19: 40 meq via ORAL
  Filled 2020-12-19: qty 2

## 2020-12-19 NOTE — Progress Notes (Signed)
PROGRESS NOTE    Pamela [name]  MVH:[id number] DOB: [birth year] DOA: 12/17/2020 PCP: Corwin Levins, MD   Chief Complain: Bright red blood per rectum  Brief Narrative: Patient is a [age]M female with history of diverticulosis, distal rectal ulcer, permanent A. fib on Eliquis, chronic diastolic congestive heart failure, type 2 diabetes, hypertension, morbid obesity who presented to the emergency department from skilled nursing facility due to multiple stools associated with bright red blood per rectum and also with some rectal discomfort while passing stool.  She was just discharged from here on 12/16/2020 after admitted for lower GI bleed at that time she had colonoscopy which showed nonbleeding stercoral ulcer and left colonic diverticulitis.  On presentation she was noticed to have bright red blood per rectum on rectal examination, positive FOBT.  Hemoglobin in the range of 9 and presentation, she was given a unit of blood transfusion.  GI consulted.  Assessment & Plan:   Active Problems:   GI bleed  Acute lower GI bleed: She was admitted and was managed for the same recently, discharged on 7/10.  Had a colonoscopy done on 7/8 which showed nonbleeding stercoral ulcer and left colon diverticulitis.  FOBT was positive.  Hemoglobin was noticed to be in the range of 9 on presentation, FOBT positive. Hb dropped to range of 7 today.  We will continue to monitor. She has been off Eliquis for a week, plan was to restart the Eliquis on 7/12, continue to hold now.  As per the report, she was given a unit of blood transfusion in the emergency department.  GI following  Continue to monitor H&H Continue MiraLAX twice daily  Paroxysmal A. fib: Currently Eliquis is on hold due to recent GI bleed.  Continue to hold.  Rate is controlled  CKD stage IIIb: Currently kidney function at baseline.  Her baseline creatinine is around 1.6.  Leukocytosis: Most likely reactive.  No signs of infection.  Continue to  monitor  Diabetes type 2: Recent hemoglobin A1c of 6.9.  Continue sliding scale insulin.  Diabetic polyneuropathy: On Lyrica  History of hyperlipidemia: On statin  Chronic diastolic congestive heart failure: Currently euvolemic.  Hypertension: Currently blood stable on Toprol, Cardizem  Morbid obesity: BMI 47.6  Debility/deconditioning/left knee and left hip pain: Currently staying at the skilled nursing facility.  Continue supportive care          DVT prophylaxis:SCD Code Status: Full Family Communication: None at bedside Status is: Inpatient  Remains inpatient appropriate because:Unsafe d/c plan  Dispo:  Patient From: SNF  Planned Disposition: SNF  Medically stable for discharge: No       Consultants: GI  Procedures:None  Antimicrobials:  Anti-infectives (From admission, onward)    None       Subjective:  Patient seen and examined the bedside this morning.  Hemodynamically stable.  Denies any bloody bowel movement last night.  No new complaints  Objective: Vitals:   12/18/20 1727 12/18/20 2044 12/19/20 0500 12/19/20 0516  BP: (!) 99/55 (!) 104/52  (!) 116/97  Pulse: 99 96  94  Resp: 19   20  Temp: 98 F (36.7 C) 97.8 F (36.6 C)  98.1 F (36.7 C)  TempSrc: Oral Oral  Oral  SpO2: 98% 100%  97%  Weight:   (!) 158.9 kg     Intake/Output Summary (Last 24 hours) at 12/19/2020 0836 Last data filed at 12/19/2020 0600 Gross per 24 hour  Intake --  Output 1800 ml  Net -1800 ml  Filed Weights   12/19/20 0500  Weight: (!) 158.9 kg    Examination:  General exam: Overall comfortable, not in distress, morbidly obese HEENT: PERRL Respiratory system:  no wheezes or crackles  Cardiovascular system: S1 & S2 heard, RRR.  Gastrointestinal system: Abdomen is nondistended, soft and nontender. Central nervous system: Alert and oriented Extremities: No edema, no clubbing ,no cyanosis Skin: No rashes, no ulcers,no icterus      Data Reviewed: I have  personally reviewed following labs and imaging studies  CBC: Recent Labs  Lab 12/13/20 0831 12/13/20 1657 12/14/20 1743 12/15/20 0512 12/16/20 0330 12/17/20 2146 12/18/20 0341 12/18/20 1241 12/18/20 1823 12/19/20 0104  WBC 12.3*   < > 12.7* 11.0* 9.9 14.8*  --   --  13.4*  --   NEUTROABS 9.8*  --   --   --   --  11.8*  --   --   --   --   HGB 12.9   < > 11.5* 10.6* 10.0* 9.1* 8.9* 8.9* 7.9* 7.7*  HCT 41.5   < > 35.8* 32.8* 32.3* 30.0* 29.3* 30.1* 26.2* 24.6*  MCV 83.0   < > 81.9 82.0 82.8 86.5  --   --  85.6  --   PLT 302   < > 257 222 260 331  --   --  227  --    < > = values in this interval not displayed.   Basic Metabolic Panel: Recent Labs  Lab 12/15/20 0512 12/15/20 1954 12/16/20 0330 12/17/20 2146 12/18/20 0340 12/18/20 1823  NA 135 132* 136 134*  --  135  K 2.7* 4.0 3.4* 4.3  --  4.0  CL 99 99 102 102  --  101  CO2 --  23  GLUCOSE 157* 335* 220* 246*  --  259*  BUN --  16  CREATININE 1.31* 1.57* 1.37* 1.34*  --  1.55*  CALCIUM 8.7* 8.6* 8.6* 8.6*  --  8.4*  MG  --   --   --   --  1.9  --   PHOS  --   --   --   --  3.7  --    GFR: Estimated Creatinine Clearance: 62.2 mL/min (A) (by C-G formula based on SCr of 1.55 mg/dL (H)). Liver Function Tests: Recent Labs  Lab 12/13/20 0831 12/15/20 0512 12/17/20 2146  AST 21 12* 17  ALT ALKPHOS 83 63 57  BILITOT 1.2 1.0 0.9  PROT 6.3* 5.4* 5.3*  ALBUMIN 2.9* 2.6* 2.6*   No results for input(s): LIPASE, AMYLASE in the last 168 hours. No results for input(s): AMMONIA in the last 168 hours. Coagulation Profile: Recent Labs  Lab 12/13/20 0831 12/18/20 0341  INR 1.2 1.2   Cardiac Enzymes: No results for input(s): CKTOTAL, CKMB, CKMBINDEX, TROPONINI in the last 168 hours. BNP (last 3 results) Recent Labs    03/20/20 1145 06/21/20 1129  PROBNP 125.0* 139.0*   HbA1C: No results for input(s): HGBA1C in the last 72 hours. CBG: Recent Labs  Lab 12/18/20 1622  12/18/20 1726 12/19/20 0005 12/19/20 0359 12/19/20 0645  GLUCAP 215* 228* 163* 145* 114*   Lipid Profile: No results for input(s): CHOL, HDL, LDLCALC, TRIG, CHOLHDL, LDLDIRECT in the last 72 hours. Thyroid Function Tests: No results for input(s): TSH, T4TOTAL, FREET4, T3FREE, THYROIDAB in the last 72 hours. Anemia Panel: No results for input(s): VITAMINB12, FOLATE, FERRITIN, TIBC, IRON, RETICCTPCT in  the last 72 hours. Sepsis Labs: No results for input(s): PROCALCITON, LATICACIDVEN in the last 168 hours.  Recent Results (from the past 240 hour(s))  Resp Panel by RT-PCR (Flu A&B, Covid) Nasopharyngeal Swab     Status: None   Collection Time: 12/13/20  8:31 AM   Specimen: Nasopharyngeal Swab; Nasopharyngeal(NP) swabs in vial transport medium  Result Value Ref Range Status   SARS Coronavirus 2 by RT PCR NEGATIVE NEGATIVE Final    Comment: (NOTE) SARS-CoV-2 target nucleic acids are NOT DETECTED.  The SARS-CoV-2 RNA is generally detectable in upper respiratory specimens during the acute phase of infection. The lowest concentration of SARS-CoV-2 viral copies this assay can detect is 138 copies/mL. A negative result does not preclude SARS-Cov-2 infection and should not be used as the sole basis for treatment or other patient management decisions. A negative result may occur with  improper specimen collection/handling, submission of specimen other than nasopharyngeal swab, presence of viral mutation(s) within the areas targeted by this assay, and inadequate number of viral copies(<138 copies/mL). A negative result must be combined with clinical observations, patient history, and epidemiological information. The expected result is Negative.  Fact Sheet for Patients:  BloggerCourse.comhttps://www.fda.gov/media/152166/download  Fact Sheet for Healthcare Providers:  SeriousBroker.ithttps://www.fda.gov/media/152162/download  This test is no t yet approved or cleared by the [place] FDA and  has been authorized  for detection and/or diagnosis of SARS-CoV-2 by FDA under an Emergency Use Authorization (EUA). This EUA will remain  in effect (meaning this test can be used) for the duration of the COVID-19 declaration under Section 564(b)(1) of the Act, 21 U.S.C.section 360bbb-3(b)(1), unless the authorization is terminated  or revoked sooner.       Influenza A by PCR NEGATIVE NEGATIVE Final   Influenza B by PCR NEGATIVE NEGATIVE Final    Comment: (NOTE) The Xpert Xpress SARS-CoV-2/FLU/RSV plus assay is intended as an aid in the diagnosis of influenza from Nasopharyngeal swab specimens and should not be used as a sole basis for treatment. Nasal washings and aspirates are unacceptable for Xpert Xpress SARS-CoV-2/FLU/RSV testing.  Fact Sheet for Patients: BloggerCourse.comhttps://www.fda.gov/media/152166/download  Fact Sheet for Healthcare Providers: SeriousBroker.ithttps://www.fda.gov/media/152162/download  This test is not yet approved or cleared by the [place] FDA and has been authorized for detection and/or diagnosis of SARS-CoV-2 by FDA under an Emergency Use Authorization (EUA). This EUA will remain in effect (meaning this test can be used) for the duration of the COVID-19 declaration under Section 564(b)(1) of the Act, 21 U.S.C. section 360bbb-3(b)(1), unless the authorization is terminated or revoked.  Performed at [facility], [address] [place], [place] [address]   Resp Panel by RT-PCR (Flu A&B, Covid) Nasopharyngeal Swab     Status: None   Collection Time: 12/16/20 10:58 AM   Specimen: Nasopharyngeal Swab; Nasopharyngeal(NP) swabs in vial transport medium  Result Value Ref Range Status   SARS Coronavirus 2 by RT PCR NEGATIVE NEGATIVE Final    Comment: (NOTE) SARS-CoV-2 target nucleic acids are NOT DETECTED.  The SARS-CoV-2 RNA is generally detectable in upper respiratory specimens during the acute phase of infection. The lowest concentration of SARS-CoV-2 viral copies this assay can detect  is 138 copies/mL. A negative result does not preclude SARS-Cov-2 infection and should not be used as the sole basis for treatment or other patient management decisions. A negative result may occur with  improper specimen collection/handling, submission of specimen other than nasopharyngeal swab, presence of viral mutation(s) within the areas targeted by this assay, and inadequate  number of viral copies(<138 copies/mL). A negative result must be combined with clinical observations, patient history, and epidemiological information. The expected result is Negative.  Fact Sheet for Patients:  BloggerCourse.com  Fact Sheet for Healthcare Providers:  SeriousBroker.it  This test is no t yet approved or cleared by the [place] FDA and  has been authorized for detection and/or diagnosis of SARS-CoV-2 by FDA under an Emergency Use Authorization (EUA). This EUA will remain  in effect (meaning this test can be used) for the duration of the COVID-19 declaration under Section 564(b)(1) of the Act, 21 U.S.C.section 360bbb-3(b)(1), unless the authorization is terminated  or revoked sooner.       Influenza A by PCR NEGATIVE NEGATIVE Final   Influenza B by PCR NEGATIVE NEGATIVE Final    Comment: (NOTE) The Xpert Xpress SARS-CoV-2/FLU/RSV plus assay is intended as an aid in the diagnosis of influenza from Nasopharyngeal swab specimens and should not be used as a sole basis for treatment. Nasal washings and aspirates are unacceptable for Xpert Xpress SARS-CoV-2/FLU/RSV testing.  Fact Sheet for Patients: BloggerCourse.com  Fact Sheet for Healthcare Providers: SeriousBroker.it  This test is not yet approved or cleared by the [place] FDA and has been authorized for detection and/or diagnosis of SARS-CoV-2 by FDA under an Emergency Use Authorization (EUA). This EUA will remain in effect  (meaning this test can be used) for the duration of the COVID-19 declaration under Section 564(b)(1) of the Act, 21 U.S.C. section 360bbb-3(b)(1), unless the authorization is terminated or revoked.  Performed at [facility], [address] [place], [place] [address]   SARS CORONAVIRUS 2 (TAT 6-24 HRS) Nasopharyngeal Nasopharyngeal Swab     Status: None   Collection Time: 12/17/20  9:20 PM   Specimen: Nasopharyngeal Swab  Result Value Ref Range Status   SARS Coronavirus 2 NEGATIVE NEGATIVE Final    Comment: (NOTE) SARS-CoV-2 target nucleic acids are NOT DETECTED.  The SARS-CoV-2 RNA is generally detectable in upper and lower respiratory specimens during the acute phase of infection. Negative results do not preclude SARS-CoV-2 infection, do not rule out co-infections with other pathogens, and should not be used as the sole basis for treatment or other patient management decisions. Negative results must be combined with clinical observations, patient history, and epidemiological information. The expected result is Negative.  Fact Sheet for Patients: HairSlick.no  Fact Sheet for Healthcare Providers: quierodirigir.com  This test is not yet approved or cleared by the [place] FDA and  has been authorized for detection and/or diagnosis of SARS-CoV-2 by FDA under an Emergency Use Authorization (EUA). This EUA will remain  in effect (meaning this test can be used) for the duration of the COVID-19 declaration under Se ction 564(b)(1) of the Act, 21 U.S.C. section 360bbb-3(b)(1), unless the authorization is terminated or revoked sooner.  Performed at [facility], [address] [place], [place] [address]          Radiology Studies: DG Chest Portable 1 View  Result Date: 12/17/2020 CLINICAL DATA:  Decreased left basilar breath sounds, initial encounter EXAM: PORTABLE CHEST 1 VIEW COMPARISON:  03/20/2020  FINDINGS: Cardiac shadow is enlarged but stable. Lungs are well aerated bilaterally. No focal infiltrate or effusion is seen. No bony abnormality is noted. IMPRESSION: No acute abnormality seen. Electronically Signed   By: Alcide Clever M.D.   On: 12/17/2020 22:22        Scheduled Meds:  atorvastatin  40 mg Oral Daily   hydrocortisone  1 application Rectal BID   insulin aspart  0-9 Units Subcutaneous Q4H   polyethylene glycol  17 g Oral BID   pregabalin  50 mg Oral TID   Continuous Infusions:     LOS: 1 day    Time spent: 35 mins.More than 50% of that time was spent in counseling and/or coordination of care.      Burnadette Pop, MD [facility] P7/13/2022, 8:36 AM

## 2020-12-19 NOTE — Plan of Care (Signed)

## 2020-12-19 NOTE — Progress Notes (Signed)
Daily Rounding Note  12/19/2020, 12:58 PM  LOS: 1 day   SUBJECTIVE:   Chief complaint:  painless hematochezia.  No stools or bleeding since Monday.  Got doses of MiraLAX Tuesday evening and this morning Wednesday.  Feels well.    OBJECTIVE:         Vital signs in last 24 hours:    Temp:  [97.8 F (36.6 C)-98.4 F (36.9 C)] 98.4 F (36.9 C) (07/13 1004) Pulse Rate:  [94-99] 98 (07/13 1004) Resp:  [13-20] 16 (07/13 1004) BP: (93-125)/(52-99) 122/63 (07/13 1004) SpO2:  [81 %-100 %] 93 % (07/13 1004) Weight:  [158.9 kg] 158.9 kg (07/13 0500) Last BM Date: 12/14/20 Filed Weights   12/19/20 0500  Weight: (!) 158.9 kg   General: Does not look acutely ill.  Obese, comfortable, fully alert. Heart: Irregularly irregular, rate in the low 100s. Chest: Clear bilaterally without labored breathing or cough. Abdomen: Obese, soft, not tender, not distended.  Bowel sounds active. Extremities: Nonpitting lower extremity swelling. Neuro/Psych: Pleasant, calm, fully alert and oriented.  Moves all 4 limbs without tremor, strength not tested.  Intake/Output from previous day: 07/12 0701 - 07/13 0700 In: -  Out: 1800 [Urine:1800]  Intake/Output this shift: No intake/output data recorded.  Lab Results: Recent Labs    12/17/20 2146 12/18/20 0341 12/18/20 1823 12/19/20 0104 12/19/20 0731  WBC 14.8*  --  13.4*  --  10.8*  HGB 9.1*   < > 7.9* 7.7* 7.4*  HCT 30.0*   < > 26.2* 24.6* 23.6*  PLT 331  --  227  --  232   < > = values in this interval not displayed.   BMET Recent Labs    12/17/20 2146 12/18/20 1823 12/19/20 0731  NA 134* 135 137  K 4.3 4.0 3.3*  CL 102 101 105  CO2 23 23 28   GLUCOSE 246* 259* 113*  BUN 12 16 11   CREATININE 1.34* 1.55* 1.24*  CALCIUM 8.6* 8.4* 8.1*   LFT Recent Labs    12/17/20 2146  PROT 5.3*  ALBUMIN 2.6*  AST 17  ALT 18  ALKPHOS 57  BILITOT 0.9   PT/INR Recent Labs     12/18/20 0341  LABPROT 14.8  INR 1.2   Hepatitis Panel No results for input(s): HEPBSAG, HCVAB, HEPAIGM, HEPBIGM in the last 72 hours.  Studies/Results: DG Chest Portable 1 View  Result Date: 12/17/2020 CLINICAL DATA:  Decreased left basilar breath sounds, initial encounter EXAM: PORTABLE CHEST 1 VIEW COMPARISON:  03/20/2020 FINDINGS: Cardiac shadow is enlarged but stable. Lungs are well aerated bilaterally. No focal infiltrate or effusion is seen. No bony abnormality is noted. IMPRESSION: No acute abnormality seen. Electronically Signed   By: 02/17/2021 M.D.   On: 12/17/2020 22:22    ASSESMENT:   Painless hematochezia.  12/14/20 Colonoscopy with stercoral ulcer in rectum and left-sided diverticulosis.  Bleeding attributed to the ulcer though diverticular bleed not ruled out.  Issues with constipation for least a month.  Episode of recurrent bloody stool on Monday evening 7/11, no stools or bleeding since then.  Earlier that day on Monday had had a brown stool.     Chronic Eliquis for A. fib.  Remains on hold.  ABL anemia.  Hgb 8.9 >> 7.4.  steady downtrend over last 6 days.  1 PRBC on 7/11 - 7/12.    PLAN      Advance to carb mod diet.  Continue twice daily  MiraLAX.  Given her bedbound status might need additional stimulant laxative such as Dulcolax or Senokot.   ?  How long to hold the Eliquis, when to resume? Recheck CBC in the morning.  Does not need every 6 hour H&H so I discontinue these orders.    Jennye Moccasin  12/19/2020, 12:58 PM Phone [phone]

## 2020-12-19 NOTE — Evaluation (Signed)
Physical Therapy Evaluation Patient Details Name: Pamela [name] MRN: [id number] DOB: Jan 22, 1956 Today's Date: 12/19/2020   History of Present Illness  Pt is a 65 y.o. female who presented 12/17/20 from [place] with multiple loose stools with associative bright red blood. Admitted for similar complaint 12/13/20  colonoscopy done on 12/14/2020 which showed nonbleeding stercoral ulcer and left colon diverticulosis. In ED, EDP noted BRBPR on rectal exam with positive FOBT and drop in Hgb. Received 1 unit PRBC. Admitted for acute lower GI bleed and leukocytosis   Of note, she came from home and was admitted from 6/11-18 for AKI on stage 3b CKD thought to be due to rhabdo, diuretics, and ARB. PMH: anxiety, permanent a-fib, chronic diastolic CHF, depression, DM2, diastolic dysfunction, HTN, migraines, R knee DJD, rosacea.  Clinical Impression  Pt returned to hospital for GI Bleed from [facility], pt reports that current level of mobility is commensurate with last PT session at SNF, however pt with dizziness with positional change, likely due to decreased BP, as Hgb is down at >8 today, Dynamap not readily available during session. Pt is currently supervision for bed mobility and min guard for transfers. PT recommends pt return to [place] when medically stable to resume PT to eventually return home.     Follow Up Recommendations SNF    Equipment Recommendations  None recommended by PT       Precautions / Restrictions Precautions Precautions: Fall Precaution Comments: L knee pain Restrictions Weight Bearing Restrictions: No      Mobility  Bed Mobility Overal bed mobility: Needs Assistance Bed Mobility: Sit to Supine;Rolling;Sidelying to Sit Rolling: Supervision Sidelying to sit: Supervision Supine to sit: Supervision Sit to supine: Supervision   General bed mobility comments: supervision for safety, pt utilized bed rails    Transfers Overall transfer level: Needs  assistance Equipment used: Rolling walker (2 wheeled) Transfers: Sit to/from Stand Sit to Stand: From elevated surface;Min guard        Lateral/Scoot Transfers: Min guard General transfer comment: attempted 1x from low bed and unable, with elevated bed pt able to come to standing with increased effort, able to static stand ~20 sec before needing to sit due to pain, min guard for lateral scooting along EoB to move toward HoB  Ambulation/Gait             General Gait Details: Unable        Balance Overall balance assessment: Needs assistance Sitting-balance support: Feet supported;No upper extremity supported Sitting balance-[name]: Fair Sitting balance - Comments: static sitting and weight shifting supervision at most no LOB   Standing balance support: Bilateral upper extremity supported Standing balance-[name]: Poor Standing balance comment: Pt requires bil UE support on RW with min guard to stand ~20 sec                             Pertinent Vitals/Pain Pain Assessment: Faces Faces Pain Scale: Hurts little more Pain Location: L hip and R knee with standing Pain Descriptors / Indicators: Grimacing;Guarding;Discomfort Pain Intervention(s): Limited activity within patient's tolerance;Monitored during session;Repositioned    Home Living Family/patient expects to be discharged to:: Skilled nursing facility Living Arrangements: Alone Available Help at Discharge: Family;Available PRN/intermittently Type of Home: Apartment Home Access: Level entry     Home Layout: One level Home Equipment: Grab bars - tub/shower      Prior Function Level of Independence: Needs assistance   Gait / Transfers  Assistance Needed: pt reports she was able to stand for <70min, was not walking while at SNF, was not able to stand pivot to surfaces  ADL's / Homemaking Assistance Needed: Pt reports she was having assistance with all ADL. Pt was not getting to [facility] at SNF, she  was wearing diapers  Comments: prior to previous SNF admission pt was independent with ADL/IADL and self-care     Hand Dominance   Dominant Hand: Right    Extremity/Trunk Assessment   Upper Extremity Assessment Upper Extremity Assessment: Generalized weakness    Lower Extremity Assessment Lower Extremity Assessment: RLE deficits/detail;LLE deficits/detail RLE Deficits / Details: Generalized weakness; limited knee flexion to ~90 degrees and knee extension to ~ -10 degrees RLE Sensation: history of peripheral neuropathy RLE Coordination: decreased gross motor LLE Deficits / Details: Generalized weakness; limited knee flexion to ~90 degrees and knee extension to ~ -10 degrees LLE Sensation: history of peripheral neuropathy LLE Coordination: decreased gross motor    Cervical / Trunk Assessment Cervical / Trunk Assessment: Normal  Communication   Communication: No difficulties  Cognition Arousal/Alertness: Awake/alert Behavior During Therapy: WFL for tasks assessed/performed Overall Cognitive Status: Within Functional Limits for tasks assessed                                 General Comments: Unaware of diarrhea though      General Comments General comments (skin integrity, edema, etc.): Pt reports feeling dizziness with postional change, max HR noted with standing 145 bpm        Assessment/Plan    PT Assessment Patient needs continued PT services  PT Problem List Decreased strength;Decreased activity tolerance;Decreased balance;Decreased mobility;Decreased coordination;Decreased knowledge of use of DME;Decreased safety awareness;Decreased knowledge of precautions;Pain;Decreased range of motion;Impaired sensation;Obesity       PT Treatment Interventions DME instruction;Gait training;Functional mobility training;Therapeutic activities;Neuromuscular re-education;Balance training;Therapeutic exercise;Patient/family education    PT Goals (Current goals can be  found in the Care Plan section)  Acute Rehab PT Goals Patient Stated Goal: to go back to SNF PT Goal Formulation: With patient Time For Goal Achievement: 12/29/20 Potential to Achieve Goals: Good    Frequency Min 2X/week   Barriers to discharge Decreased caregiver support         AM-PAC PT "6 Clicks" Mobility  Outcome Measure Help needed turning from your back to your side while in a flat bed without using bedrails?: A Little Help needed moving from lying on your back to sitting on the side of a flat bed without using bedrails?: A Little Help needed moving to and from a bed to a chair (including a wheelchair)?: Total Help needed standing up from a chair using your arms (e.g., wheelchair or bedside chair)?: A Lot Help needed to walk in hospital room?: Total Help needed climbing 3-5 steps with a railing? : Total 6 Click Score: 11    End of Session Equipment Utilized During Treatment: Gait belt Activity Tolerance: Patient tolerated treatment well Patient left: in bed;with call bell/phone within reach;with bed alarm set;Other (comment) (bed placed in chair position) Nurse Communication: Mobility status;Need for lift equipment;Other (comment) (diarrhea) PT Visit Diagnosis: Other abnormalities of gait and mobility (R26.89);Pain;Unsteadiness on feet (R26.81);Muscle weakness (generalized) (M62.81);Difficulty in walking, not elsewhere classified (R26.2) Pain - Right/Left:  (bil) Pain - part of body: Knee;Hip    Time: 7035-0093 PT Time Calculation (min) (ACUTE ONLY): 24 min   Charges:   PT Evaluation $PT Eval Moderate  Complexity: 1 Mod PT Treatments $Therapeutic Activity: 8-22 mins        Shailyn Weyandt B. Beverely Risen PT, DPT Acute Rehabilitation Services Pager [phone] Office [phone]   Elon Alas Fleet 12/19/2020, 10:19 AM

## 2020-12-20 DIAGNOSIS — K626 Ulcer of anus and rectum: Secondary | ICD-10-CM | POA: Diagnosis not present

## 2020-12-20 DIAGNOSIS — D649 Anemia, unspecified: Secondary | ICD-10-CM | POA: Diagnosis not present

## 2020-12-20 DIAGNOSIS — K625 Hemorrhage of anus and rectum: Secondary | ICD-10-CM | POA: Diagnosis not present

## 2020-12-20 LAB — BASIC METABOLIC PANEL
Anion gap: 4 — ABNORMAL LOW (ref 5–15)
BUN: 8 mg/dL (ref 8–23)
CO2: 29 mmol/L (ref 22–32)
Calcium: 8.4 mg/dL — ABNORMAL LOW (ref 8.9–10.3)
Chloride: 104 mmol/L (ref 98–111)
Creatinine, Ser: 1.22 mg/dL — ABNORMAL HIGH (ref 0.44–1.00)
GFR, Estimated: 50 mL/min — ABNORMAL LOW (ref >60)
Glucose, Bld: 145 mg/dL — ABNORMAL HIGH (ref 70–99)
Potassium: 3.5 mmol/L (ref 3.5–5.1)
Sodium: 137 mmol/L (ref 135–145)

## 2020-12-20 LAB — CBC
HCT: 23.7 % — ABNORMAL LOW (ref 36.0–46.0)
Hemoglobin: 7.3 g/dL — ABNORMAL LOW (ref 12.0–15.0)
MCH: 26.3 pg (ref 26.0–34.0)
MCHC: 30.8 g/dL (ref 30.0–36.0)
MCV: 85.3 fL (ref 80.0–100.0)
Platelets: 230 10*3/uL (ref 150–400)
RBC: 2.78 MIL/uL — ABNORMAL LOW (ref 3.87–5.11)
RDW: 17.4 % — ABNORMAL HIGH (ref 11.5–15.5)
WBC: 9.4 10*3/uL (ref 4.0–10.5)
nRBC: 0.5 % — ABNORMAL HIGH (ref 0.0–0.2)

## 2020-12-20 LAB — GLUCOSE, CAPILLARY
Glucose-Capillary: 140 mg/dL — ABNORMAL HIGH (ref 70–99)
Glucose-Capillary: 172 mg/dL — ABNORMAL HIGH (ref 70–99)
Glucose-Capillary: 163 mg/dL — ABNORMAL HIGH (ref 70–99)
Glucose-Capillary: 168 mg/dL — ABNORMAL HIGH (ref 70–99)
Glucose-Capillary: 206 mg/dL — ABNORMAL HIGH (ref 70–99)

## 2020-12-20 LAB — PREPARE RBC (CROSSMATCH)

## 2020-12-20 LAB — FERRITIN: Ferritin: 46 ng/mL (ref 11–307)

## 2020-12-20 LAB — HEMOGLOBIN AND HEMATOCRIT, BLOOD
HCT: 25.8 % — ABNORMAL LOW (ref 36.0–46.0)
Hemoglobin: 7.9 g/dL — ABNORMAL LOW (ref 12.0–15.0)

## 2020-12-20 LAB — VITAMIN B12: Vitamin B-12: 262 pg/mL (ref 180–914)

## 2020-12-20 MED ORDER — SODIUM CHLORIDE 0.9% IV SOLUTION: Freq: Once | INTRAVENOUS | Status: AC

## 2020-12-20 NOTE — Progress Notes (Signed)
Pamela Kitchen PROGRESS NOTE    SPARROW [name]  BOF:[id number] DOB: [birth year] DOA: 12/17/2020 PCP: Corwin Levins, MD   Chief Complain: Bright red blood per rectum  Brief Narrative: Patient is a [age]M female with history of diverticulosis, distal rectal ulcer, permanent A. fib on Eliquis, chronic diastolic congestive heart failure, type 2 diabetes, hypertension, morbid obesity who presented to the emergency department from skilled nursing facility due to multiple stools associated with bright red blood per rectum and also with some rectal discomfort while passing stool.  She was just discharged from here on 12/16/2020 after admitted for lower GI bleed at that time she had colonoscopy which showed nonbleeding stercoral ulcer and left colonic diverticulitis.  On presentation she was noticed to have bright red blood per rectum on rectal examination, positive FOBT.  Hemoglobin in the range of 9 and presentation, she was given a unit of blood transfusion.  GI consulted. There is no plan for further intervention as per GI and they have signed off.  We will transfuse her with a unit of PRBC today .plan for discharge back to skilled nursing facility tomorrow.  TOC notified  Assessment & Plan:   Active Problems:   Normocytic anemia   Stercoral ulcer of rectum   GI bleed   Rectal bleeding  Acute lower GI bleed: She was admitted and was managed for the same recently, discharged on 7/10.  Had a colonoscopy done on 7/8 which showed nonbleeding stercoral ulcer and left colon diverticulitis.  FOBT was positive.  Hemoglobin was noticed to be in the range of 9 on presentation, FOBT positive. Hb in the range of 7 today.  We will continue to monitor. She will be given another unit of PRBC today. She has been off Eliquis for more than a week, will resume Eliquis tomorrow.   GI following, no plan for further colonoscopy.  GI thinks that this is minor rectal bleeding and not diverticular bleed. Continue to monitor H&H Continue  MiraLAX twice daily  Paroxysmal A. fib: Currently Eliquis is on hold due to recent GI bleed. We will resume on dc.  Rate is controlled  CKD stage IIIb: Currently kidney function at baseline.  Her baseline creatinine is around 1.6.  Leukocytosis: Most likely reactive.  No signs of infection.  Continue to monitor  Diabetes type 2: Recent hemoglobin A1c of 6.9.  Continue sliding scale insulin.  Diabetic polyneuropathy: On Lyrica  History of hyperlipidemia: On statin  Chronic diastolic congestive heart failure: Currently euvolemic.  Hypertension: Currently blood stable on Toprol, Cardizem  Morbid obesity: BMI 47.6  Debility/deconditioning/left knee and left hip pain: Currently staying at the skilled nursing facility.  Continue supportive care          DVT prophylaxis:SCD Code Status: Full Family Communication: None at bedside Status is: Inpatient  Remains inpatient appropriate because:Unsafe d/c plan  Dispo:  Patient From: SNF  Planned Disposition: SNF  Medically stable for discharge: No    DC planning tomorrow   Consultants: GI  Procedures:None  Antimicrobials:  Anti-infectives (From admission, onward)    None       Subjective:  Patient seen and examined the bedside this morning.  Currently hemodynamically stable.  Denies any rectal bleeding.  Overall comfortable.  Objective: Vitals:   12/19/20 1700 12/19/20 2210 12/20/20 0506 12/20/20 0816  BP: 95/62 (!) 109/59 (!) 90/58 (!) 108/54  Pulse: 83 (!) 101 91 95  Resp:  18 16 20   Temp: 98.6 F (37 C) 98.6 F (37  C) (!) 97.5 F (36.4 C) 98.7 F (37.1 C)  TempSrc: Oral Oral Oral Oral  SpO2: 96% 97% 95% 96%  Weight:        Intake/Output Summary (Last 24 hours) at 12/20/2020 1312 Last data filed at 12/20/2020 1100 Gross per 24 hour  Intake 1200 ml  Output 1900 ml  Net -700 ml   Filed Weights   12/19/20 0500  Weight: (!) 158.9 kg    Examination:  General exam: Overall comfortable, not in  distress,obese HEENT: PERRL Respiratory system:  no wheezes or crackles  Cardiovascular system: S1 & S2 heard, RRR.  Gastrointestinal system: Abdomen is nondistended, soft and nontender. Central nervous system: Alert and oriented Extremities: No edema, no clubbing ,no cyanosis Skin: No rashes, no ulcers,no icterus        Data Reviewed: I have personally reviewed following labs and imaging studies  CBC: Recent Labs  Lab 12/16/20 0330 12/17/20 2146 12/18/20 0341 12/18/20 1241 12/18/20 1823 12/19/20 0104 12/19/20 0731 12/20/20 0311  WBC 9.9 14.8*  --   --  13.4*  --  10.8* 9.4  NEUTROABS  --  11.8*  --   --   --   --   --   --   HGB 10.0* 9.1*   < > 8.9* 7.9* 7.7* 7.4* 7.3*  HCT 32.3* 30.0*   < > 30.1* 26.2* 24.6* 23.6* 23.7*  MCV 82.8 86.5  --   --  85.6  --  84.0 85.3  PLT 260 331  --   --  227  --  232 230   < > = values in this interval not displayed.   Basic Metabolic Panel: Recent Labs  Lab 12/16/20 0330 12/17/20 2146 12/18/20 0340 12/18/20 1823 12/19/20 0731 12/20/20 0311  NA 136 134*  --  135 137 137  K 3.4* 4.3  --  4.0 3.3* 3.5  CL 102 102  --  101 105 104  CO2 28 23  --  23 28 29   GLUCOSE 220* 246*  --  259* 113* 145*  BUN 13 12  --  16 11 8   CREATININE 1.37* 1.34*  --  1.55* 1.24* 1.22*  CALCIUM 8.6* 8.6*  --  8.4* 8.1* 8.4*  MG  --   --  1.9  --   --   --   PHOS  --   --  3.7  --   --   --    GFR: Estimated Creatinine Clearance: 79 mL/min (A) (by C-G formula based on SCr of 1.22 mg/dL (H)). Liver Function Tests: Recent Labs  Lab 12/15/20 0512 12/17/20 2146  AST 12* 17  ALT 13 18  ALKPHOS 63 57  BILITOT 1.0 0.9  PROT 5.4* 5.3*  ALBUMIN 2.6* 2.6*   No results for input(s): LIPASE, AMYLASE in the last 168 hours. No results for input(s): AMMONIA in the last 168 hours. Coagulation Profile: Recent Labs  Lab 12/18/20 0341  INR 1.2   Cardiac Enzymes: No results for input(s): CKTOTAL, CKMB, CKMBINDEX, TROPONINI in the last 168 hours. BNP  (last 3 results) Recent Labs    03/20/20 1145 06/21/20 1129  PROBNP 125.0* 139.0*   HbA1C: No results for input(s): HGBA1C in the last 72 hours. CBG: Recent Labs  Lab 12/19/20 2025 12/19/20 2337 12/20/20 0450 12/20/20 0811 12/20/20 1151  GLUCAP 178* 187* 140* 172* 163*   Lipid Profile: No results for input(s): CHOL, HDL, LDLCALC, TRIG, CHOLHDL, LDLDIRECT in the last 72 hours. Thyroid Function Tests: No results  for input(s): TSH, T4TOTAL, FREET4, T3FREE, THYROIDAB in the last 72 hours. Anemia Panel: No results for input(s): VITAMINB12, FOLATE, FERRITIN, TIBC, IRON, RETICCTPCT in the last 72 hours. Sepsis Labs: No results for input(s): PROCALCITON, LATICACIDVEN in the last 168 hours.  Recent Results (from the past 240 hour(s))  Resp Panel by RT-PCR (Flu A&B, Covid) Nasopharyngeal Swab     Status: None   Collection Time: 12/13/20  8:31 AM   Specimen: Nasopharyngeal Swab; Nasopharyngeal(NP) swabs in vial transport medium  Result Value Ref Range Status   SARS Coronavirus 2 by RT PCR NEGATIVE NEGATIVE Final    Comment: (NOTE) SARS-CoV-2 target nucleic acids are NOT DETECTED.  The SARS-CoV-2 RNA is generally detectable in upper respiratory specimens during the acute phase of infection. The lowest concentration of SARS-CoV-2 viral copies this assay can detect is 138 copies/mL. A negative result does not preclude SARS-Cov-2 infection and should not be used as the sole basis for treatment or other patient management decisions. A negative result may occur with  improper specimen collection/handling, submission of specimen other than nasopharyngeal swab, presence of viral mutation(s) within the areas targeted by this assay, and inadequate number of viral copies(<138 copies/mL). A negative result must be combined with clinical observations, patient history, and epidemiological information. The expected result is Negative.  Fact Sheet for Patients:   BloggerCourse.com  Fact Sheet for Healthcare Providers:  SeriousBroker.it  This test is no t yet approved or cleared by the [place] FDA and  has been authorized for detection and/or diagnosis of SARS-CoV-2 by FDA under an Emergency Use Authorization (EUA). This EUA will remain  in effect (meaning this test can be used) for the duration of the COVID-19 declaration under Section 564(b)(1) of the Act, 21 U.S.C.section 360bbb-3(b)(1), unless the authorization is terminated  or revoked sooner.       Influenza A by PCR NEGATIVE NEGATIVE Final   Influenza B by PCR NEGATIVE NEGATIVE Final    Comment: (NOTE) The Xpert Xpress SARS-CoV-2/FLU/RSV plus assay is intended as an aid in the diagnosis of influenza from Nasopharyngeal swab specimens and should not be used as a sole basis for treatment. Nasal washings and aspirates are unacceptable for Xpert Xpress SARS-CoV-2/FLU/RSV testing.  Fact Sheet for Patients: BloggerCourse.com  Fact Sheet for Healthcare Providers: SeriousBroker.it  This test is not yet approved or cleared by the [place] FDA and has been authorized for detection and/or diagnosis of SARS-CoV-2 by FDA under an Emergency Use Authorization (EUA). This EUA will remain in effect (meaning this test can be used) for the duration of the COVID-19 declaration under Section 564(b)(1) of the Act, 21 U.S.C. section 360bbb-3(b)(1), unless the authorization is terminated or revoked.  Performed at [facility], [address], [place], [place] [address]   Resp Panel by RT-PCR (Flu A&B, Covid) Nasopharyngeal Swab     Status: None   Collection Time: 12/16/20 10:58 AM   Specimen: Nasopharyngeal Swab; Nasopharyngeal(NP) swabs in vial transport medium  Result Value Ref Range Status   SARS Coronavirus 2 by RT PCR NEGATIVE NEGATIVE Final    Comment: (NOTE) SARS-CoV-2  target nucleic acids are NOT DETECTED.  The SARS-CoV-2 RNA is generally detectable in upper respiratory specimens during the acute phase of infection. The lowest concentration of SARS-CoV-2 viral copies this assay can detect is 138 copies/mL. A negative result does not preclude SARS-Cov-2 infection and should not be used as the sole basis for treatment or other patient management decisions. A negative result may occur  with  improper specimen collection/handling, submission of specimen other than nasopharyngeal swab, presence of viral mutation(s) within the areas targeted by this assay, and inadequate number of viral copies(<138 copies/mL). A negative result must be combined with clinical observations, patient history, and epidemiological information. The expected result is Negative.  Fact Sheet for Patients:  BloggerCourse.com  Fact Sheet for Healthcare Providers:  SeriousBroker.it  This test is no t yet approved or cleared by the [place] FDA and  has been authorized for detection and/or diagnosis of SARS-CoV-2 by FDA under an Emergency Use Authorization (EUA). This EUA will remain  in effect (meaning this test can be used) for the duration of the COVID-19 declaration under Section 564(b)(1) of the Act, 21 U.S.C.section 360bbb-3(b)(1), unless the authorization is terminated  or revoked sooner.       Influenza A by PCR NEGATIVE NEGATIVE Final   Influenza B by PCR NEGATIVE NEGATIVE Final    Comment: (NOTE) The Xpert Xpress SARS-CoV-2/FLU/RSV plus assay is intended as an aid in the diagnosis of influenza from Nasopharyngeal swab specimens and should not be used as a sole basis for treatment. Nasal washings and aspirates are unacceptable for Xpert Xpress SARS-CoV-2/FLU/RSV testing.  Fact Sheet for Patients: BloggerCourse.com  Fact Sheet for Healthcare  Providers: SeriousBroker.it  This test is not yet approved or cleared by the [place] FDA and has been authorized for detection and/or diagnosis of SARS-CoV-2 by FDA under an Emergency Use Authorization (EUA). This EUA will remain in effect (meaning this test can be used) for the duration of the COVID-19 declaration under Section 564(b)(1) of the Act, 21 U.S.C. section 360bbb-3(b)(1), unless the authorization is terminated or revoked.  Performed at [facility], [address], [place], [place] [address]   SARS CORONAVIRUS 2 (TAT 6-24 HRS) Nasopharyngeal Nasopharyngeal Swab     Status: None   Collection Time: 12/17/20  9:20 PM   Specimen: Nasopharyngeal Swab  Result Value Ref Range Status   SARS Coronavirus 2 NEGATIVE NEGATIVE Final    Comment: (NOTE) SARS-CoV-2 target nucleic acids are NOT DETECTED.  The SARS-CoV-2 RNA is generally detectable in upper and lower respiratory specimens during the acute phase of infection. Negative results do not preclude SARS-CoV-2 infection, do not rule out co-infections with other pathogens, and should not be used as the sole basis for treatment or other patient management decisions. Negative results must be combined with clinical observations, patient history, and epidemiological information. The expected result is Negative.  Fact Sheet for Patients: HairSlick.no  Fact Sheet for Healthcare Providers: quierodirigir.com  This test is not yet approved or cleared by the [place] FDA and  has been authorized for detection and/or diagnosis of SARS-CoV-2 by FDA under an Emergency Use Authorization (EUA). This EUA will remain  in effect (meaning this test can be used) for the duration of the COVID-19 declaration under Se ction 564(b)(1) of the Act, 21 U.S.C. section 360bbb-3(b)(1), unless the authorization is terminated or revoked sooner.  Performed at  [facility], [address] [place], [place] [address]          Radiology Studies: No results found.      Scheduled Meds:  sodium chloride   Intravenous Once   atorvastatin  40 mg Oral Daily   insulin aspart  0-9 Units Subcutaneous Q4H   polyethylene glycol  17 g Oral BID   pregabalin  50 mg Oral TID   Continuous Infusions:     LOS: 2 days  Time spent: 35 mins.More than 50% of that time was spent in counseling and/or coordination of care.      Burnadette Pop, MD [facility] P7/14/2022, 1:12 PM

## 2020-12-20 NOTE — TOC Initial Note (Addendum)
Transition of Care [facility]) - Initial/Assessment Note    Patient Details  Name: Pamela [name] MRN: [id number] Date of Birth: [birth year]  [facility] CM/SW Contact:    Cristobal Goldmann, LCSW Phone Number: 12/20/2020, 1:56 PM  Clinical Narrative:  Talked with patient at the bedside regarding her discharge disposition. Ms. Pamela [name] was in bed and was alert, oriented and engaged easily with CSW. She confirmed coming to hospital from [facility] and plans to return there to continue her rehab. When asked, patient gave permission for her either of her brothers to be contacted. Patient reported that she lives alone and she and her brother Pamela [name] used to live together.                 Expected Discharge Plan: Skilled Nursing Facility ([name] from [facility] and plans to return there to continue rehab) Barriers to Discharge: Insurance Authorization   Patient Goals and CMS Choice Patient states their goals for this hospitalization and ongoing recovery are:: Patient agreeable to returning to [place] to continue ST rehab CMS Medicare.gov Compare Post Acute Care list provided to:: Other (Comment Required) (not needed as patient returning to SNF to continue rehab) Choice offered to / list presented to : NA  Expected Discharge Plan and Services Expected Discharge Plan: Skilled Nursing Facility ([name] from [facility] and plans to return there to continue rehab) In-house Referral: Clinical Social Work   Post Acute Care Choice: Skilled Nursing Facility Living arrangements for the past 2 months: Skilled Holiday representative, Biomedical scientist (Prior to rehab, patient lives in an appartment)                                      Prior Living Arrangements/Services Living arrangements for the past 2 months: Skilled Holiday representative, Biomedical scientist (Prior to rehab, patient lives in an appartment) Lives with:: Self Patient language and need for interpreter reviewed:: No Do  you feel safe going back to the place where you live?: Yes      Need for Family Participation in Patient Care: No (Comment) Care giver support system in place?: No (comment)   Criminal Activity/Legal Involvement Pertinent to Current Situation/Hospitalization: No - Comment as needed  Activities of Daily Living Home Assistive Devices/Equipment: None ADL Screening (condition at time of admission) Patient's cognitive ability adequate to safely complete daily activities?: No Is the patient deaf or have difficulty hearing?: No Does the patient have difficulty seeing, even when wearing glasses/contacts?: No Does the patient have difficulty concentrating, remembering, or making decisions?: No Patient able to express need for assistance with ADLs?: No Does the patient have difficulty dressing or bathing?: Yes Independently performs ADLs?: No Does the patient have difficulty walking or climbing stairs?: Yes Weakness of Legs: Both Weakness of Arms/Hands: None  Permission Sought/Granted Permission sought to share information with : Family Supports Permission granted to share information with : Yes, Verbal Permission Granted  Share Information with NAME: Pamela [name] or Dechelle Attaway     Permission granted to share info w Relationship: Brothers  Permission granted to share info w Contact Information: Pamela [name] [phone] Fickle [phone]  Emotional Assessment Appearance:: Appears stated age Attitude/Demeanor/Rapport: Engaged Affect (typically observed): Pleasant Orientation: : Oriented to Self, Oriented to Place, Oriented to  Time, Oriented to Situation Alcohol / Substance Use: Tobacco Use, Illicit Drugs (Patient reported that she has never smoked, has previous alcohol use and does not  use illicit drugs.) Psych Involvement: No (comment)  Admission diagnosis:  GI bleed [K92.2] Lower GI bleed [K92.2] Orthostatic dizziness [R42] Patient Active Problem List   Diagnosis Date Noted   Rectal  bleeding    GI bleed 12/18/2020   Stercoral ulcer of rectum    Acute lower GI bleeding 12/13/2020   Chronic diastolic CHF (congestive heart failure) (HCC) 11/24/2020   Non-traumatic rhabdomyolysis 11/24/2020   Controlled type 2 diabetes mellitus with hyperglycemia (HCC) 11/24/2020   ARF (acute renal failure) (HCC) 11/18/2020   Elevated CK 11/18/2020   Acute pain of left knee 11/18/2020   AKI (acute kidney injury) (HCC) 11/18/2020   Chronic diastolic heart failure (HCC) 06/24/2020   Debility 06/21/2020   Vertigo 03/20/2020   SOB (shortness of breath) 03/20/2020   Left shoulder pain 02/27/2019   Acute upper respiratory infection 09/07/2018   Low back pain 12/01/2017   Diabetic foot ulcer (HCC) 03/03/2017   Charcot foot due to diabetes mellitus (HCC) 02/28/2017   Diabetic ulcer of toe of left foot associated with type 2 diabetes mellitus, limited to breakdown of skin (HCC) 02/28/2017   Obesity, Class III, BMI 40-49.9 (morbid obesity) (HCC) 02/28/2017   Chronic atrial fibrillation (HCC) 12/08/2015   Navicular fracture of ankle with malunion 05/30/2015   Neuropathic pain of foot 05/30/2015   Navicular fracture, foot 05/02/2015   Right ankle pain 04/19/2015   Cough 11/17/2014   Fatigue 09/25/2014   Ganglion cyst 02/06/2014   Bilateral hand pain 11/29/2012   Normocytic anemia 04/23/2012   CKD (chronic kidney disease) stage 3, GFR 30-59 ml/min (HCC) 04/23/2012   Hyperlipidemia 04/23/2012   Peripheral neuropathy 01/16/2012   DJD (degenerative joint disease) of knee 04/15/2011   Migraine 04/15/2011   Acute on chronic diastolic heart failure (HCC) 10/28/2010   Rosacea 10/11/2010   Anticoagulated 10/11/2010   Encounter for preventative adult health care exam with abnormal findings 10/06/2010   Anxiety state 12/31/2007   Depression 12/31/2007   Essential hypertension 12/31/2007   ALLERGIC RHINITIS 12/31/2007   PCP:  Corwin Levins, MD Pharmacy:   [facility] [phone] [place], [place] - [address] AT [facility] [address] [facility] [address] [place] [address] Phone: [phone] Fax: [phone]     Social Determinants of Health (SDOH) Interventions  None needed or requested at this time.  Readmission Risk Interventions No flowsheet data found.

## 2020-12-20 NOTE — Progress Notes (Signed)
   Patient Name: Pamela [name] Date of Encounter: 12/20/2020, 11:12 AM    Subjective  No bleeding'no stools   Objective  BP (!) 108/54 (BP Location: Right Arm)   Pulse 95   Temp 98.7 F (37.1 C) (Oral)   Resp 20   Wt (!) 158.9 kg   LMP 09/08/2010 (Exact Date)   SpO2 96%   BMI 47.51 kg/m   CBC Latest Ref Rng & Units 12/20/2020 12/19/2020 12/19/2020  WBC 4.0 - 10.5 K/uL 9.4 10.8(H) -  Hemoglobin 12.0 - 15.0 g/dL 7.3(L) 7.4(L) 7.7(L)  Hematocrit 36.0 - 46.0 % 23.7(L) 23.6(L) 24.6(L)  Platelets 150 - 400 K/uL 230 232 -        Assessment and Plan  Rectal bleeding and known stercoral ulcer No bleeding since admit but no BM  I think the hx supports minor rectal bleeding and NOT diverticular bleed  She can restart Eliquis anytime -she may see some bleeding and as long as small amount BUT SNF needs to resist sending her back    She has had a dwindling Hgb - I think might appropriate to give 1 uRBC   I checked ferritin and B12 (this was about 200 in Jan ? If Txed) - add on labs  Consider Tx parenteral iron and /IM B12    Signing off  Iva Boop, MD, [facility] 12/20/2020 11:12 AM

## 2020-12-20 NOTE — Plan of Care (Signed)
  Problem: Clinical Measurements: °Goal: Will remain free from infection °Outcome: Completed/Met °Goal: Diagnostic test results will improve °Outcome: Completed/Met °Goal: Respiratory complications will improve °Outcome: Completed/Met °  °

## 2020-12-20 NOTE — TOC Transition Note (Deleted)
Transition of Care (TOC) - CM/SW Discharge Note *Discharged back to [facility] *Room 117A *Number for Report: [phone]   Patient Details  Name: Pamela [name] MRN: [id number] Date of Birth: Apr 07, 1956  Transition of Care [facility]) CM/SW Contact:  Cristobal Goldmann, LCSW Phone Number: 12/20/2020, 2:08 PM   Clinical Narrative:  Patient medically stable for discharge and is returning today to [place] to continue ST rehab. Discharge clinicals transmitted to facility, brother contacted and informed of discharge and non-emergency ambulance transport arranged.      Final next level of care: Skilled Nursing Facility [facility] - Room 117A) Barriers to Discharge: Barriers Resolved   Patient Goals and CMS Choice Patient states their goals for this hospitalization and ongoing recovery are:: Patient agreeable to returnng to [place] to continue rehab CMS Medicare.gov Compare Post Acute Care list provided to:: Other (Comment Required) (n/a) Choice offered to / list presented to : NA  Discharge Placement   Existing PASRR number confirmed : 12/20/20            Patient to be transferred to facility by: PTAR Name of family member notified: Brother Demetrica Zipp - [phone] Patient and family notified of of transfer: 12/20/20  Discharge Plan and Services In-house Referral: Clinical Social Work   Post Acute Care Choice: Skilled Nursing Facility                               Social Determinants of Health (SDOH) Interventions  No SDOH interventions requested or needed at this time   Readmission Risk Interventions No flowsheet data found.

## 2020-12-20 NOTE — Plan of Care (Signed)
  Problem: Health Behavior/Discharge Planning: Goal: Ability to manage health-related needs will improve Outcome: Completed/Met   Problem: Clinical Measurements: Goal: Ability to maintain clinical measurements within normal limits will improve Outcome: Completed/Met Goal: Cardiovascular complication will be avoided Outcome: Completed/Met

## 2020-12-20 NOTE — NC FL2 (Signed)
[place] MEDICAID FL2 LEVEL OF CARE SCREENING TOOL     IDENTIFICATION  Patient Name: Pamela [name] Birthdate: 05/15/1956 Sex: female Admission Date (Current Location): 12/17/2020  [facility] and [place] Number:  Producer, television/film/video and Address:  [facility], [address], [place], [place] [address]      Provider Number: [id number]  Attending Physician Name and Address:  Burnadette Pop, MD  Relative Name and Phone Number:  Jesly Hartmann - brother; [phone]    Current Level of Care: Hospital Recommended Level of Care: Skilled Nursing Facility (From [facility]) Prior Approval Number:    Date Approved/Denied:   PASRR Number: [id number]  Discharge Plan: SNF    Current Diagnoses: Patient Active Problem List   Diagnosis Date Noted   Rectal bleeding    GI bleed 12/18/2020   Stercoral ulcer of rectum    Acute lower GI bleeding 12/13/2020   Chronic diastolic CHF (congestive heart failure) (HCC) 11/24/2020   Non-traumatic rhabdomyolysis 11/24/2020   Controlled type 2 diabetes mellitus with hyperglycemia (HCC) 11/24/2020   ARF (acute renal failure) (HCC) 11/18/2020   Elevated CK 11/18/2020   Acute pain of left knee 11/18/2020   AKI (acute kidney injury) (HCC) 11/18/2020   Chronic diastolic heart failure (HCC) 06/24/2020   Debility 06/21/2020   Vertigo 03/20/2020   SOB (shortness of breath) 03/20/2020   Left shoulder pain 02/27/2019   Acute upper respiratory infection 09/07/2018   Low back pain 12/01/2017   Diabetic foot ulcer (HCC) 03/03/2017   Charcot foot due to diabetes mellitus (HCC) 02/28/2017   Diabetic ulcer of toe of left foot associated with type 2 diabetes mellitus, limited to breakdown of skin (HCC) 02/28/2017   Obesity, Class III, BMI 40-49.9 (morbid obesity) (HCC) 02/28/2017   Chronic atrial fibrillation (HCC) 12/08/2015   Navicular fracture of ankle with malunion 05/30/2015   Neuropathic pain of foot 05/30/2015    Navicular fracture, foot 05/02/2015   Right ankle pain 04/19/2015   Cough 11/17/2014   Fatigue 09/25/2014   Ganglion cyst 02/06/2014   Bilateral hand pain 11/29/2012   Normocytic anemia 04/23/2012   CKD (chronic kidney disease) stage 3, GFR 30-59 ml/min (HCC) 04/23/2012   Hyperlipidemia 04/23/2012   Peripheral neuropathy 01/16/2012   DJD (degenerative joint disease) of knee 04/15/2011   Migraine 04/15/2011   Acute on chronic diastolic heart failure (HCC) 10/28/2010   Rosacea 10/11/2010   Anticoagulated 10/11/2010   Encounter for preventative adult health care exam with abnormal findings 10/06/2010   Anxiety state 12/31/2007   Depression 12/31/2007   Essential hypertension 12/31/2007   ALLERGIC RHINITIS 12/31/2007    Orientation RESPIRATION BLADDER Height & Weight     Self, Time, Situation, Place  Normal Incontinent (Existing external catheter) Weight: (!) 350 lb 5 oz (158.9 kg) Height:     BEHAVIORAL SYMPTOMS/MOOD NEUROLOGICAL BOWEL NUTRITION STATUS      Continent Diet (Carb modified)  AMBULATORY STATUS COMMUNICATION OF NEEDS Skin   Total Care (Patient was unable to ambulate with PT during eval on 7/13) Verbally Other (Comment) (Contact dermatitis-bilateral foot; Cracking-bilateral foot)                       Personal Care Assistance Level of Assistance  Bathing, Feeding, Dressing Bathing Assistance: Maximum assistance (Upper body - assistance with set-up) Feeding assistance: Independent Dressing Assistance: Maximum assistance (Upper body min guard)     Functional Limitations Info  Sight, Hearing, Speech Sight Info: Impaired Hearing Info:  Adequate Speech Info: Adequate    SPECIAL CARE FACTORS FREQUENCY  PT (By licensed PT), OT (By licensed OT)     PT Frequency: PT evaluation 7/13. PT at SNF eval and treat, a minimum of 5 days per week OT Frequency: OT evaluation 7/14. OT at SNF eval and treat, a minimum of 5 days per week            Contractures  Contractures Info: Not present    Additional Factors Info  Code Status, Allergies, Insulin Sliding Scale Code Status Info: Full Allergies Info: cymbalta, gabapentin   Insulin Sliding Scale Info: 0-9 Units every 4 hours       Current Medications (12/20/2020):  This is the current hospital active medication list Current Facility-Administered Medications  Medication Dose Route Frequency Provider Last Rate Last Admin   0.9 %  sodium chloride infusion (Manually program via Guardrails IV Fluids)   Intravenous Once Burnadette Pop, MD       acetaminophen (TYLENOL) tablet 650 mg  650 mg Oral Q6H PRN Dow Adolph N, DO       albuterol (PROVENTIL) (2.5 MG/3ML) 0.083% nebulizer solution 2.5 mg  2.5 mg Inhalation Q6H PRN Dow Adolph N, DO       ammonium lactate (LAC-HYDRIN) 12 % lotion 1 application  1 application Topical PRN Hall, Carole N, DO       atorvastatin (LIPITOR) tablet 40 mg  40 mg Oral Daily Dow Adolph N, DO   40 mg at 12/20/20 0109   HYDROmorphone (DILAUDID) injection 0.5 mg  0.5 mg Intravenous Q4H PRN Dow Adolph N, DO       insulin aspart (novoLOG) injection 0-9 Units  0-9 Units Subcutaneous Q4H Dow Adolph N, DO   2 Units at 12/20/20 1257   oxyCODONE (Oxy IR/ROXICODONE) immediate release tablet 5 mg  5 mg Oral Q6H PRN Dow Adolph N, DO       polyethylene glycol (MIRALAX / GLYCOLAX) packet 17 g  17 g Oral BID Meredith Pel, NP   17 g at 12/20/20 0957   pregabalin (LYRICA) capsule 50 mg  50 mg Oral TID Dow Adolph N, DO   50 mg at 12/20/20 3235   senna-docusate (Senokot-S) tablet 1 tablet  1 tablet Oral BID PRN Darlin Drop, DO         Discharge Medications: Please see discharge summary for a list of discharge medications.  Relevant Imaging Results:  Relevant Lab Results:   Additional Information ss#[id number]  Cristobal Goldmann, LCSW

## 2020-12-20 NOTE — Evaluation (Signed)
Occupational Therapy Evaluation Patient Details Name: Pamela [name] MRN: [id number] DOB: 02-16-1956 Today's Date: 12/20/2020    History of Present Illness Pt is a 65 y.o. female who presented 12/17/20 from [place] with multiple loose stools with associative bright red blood. Admitted for similar complaint 12/13/20  colonoscopy done on 12/14/2020 which showed nonbleeding stercoral ulcer and left colon diverticulosis. In ED, EDP noted BRBPR on rectal exam with positive FOBT and drop in Hgb. Received 1 unit PRBC. Admitted for acute lower GI bleed and leukocytosis   Of note, she came from home and was admitted from 6/11-18 for AKI on stage 3b CKD thought to be due to rhabdo, diuretics, and ARB. PMH: anxiety, permanent a-fib, chronic diastolic CHF, depression, DM2, diastolic dysfunction, HTN, migraines, R knee DJD, rosacea.   Clinical Impression   Pt admitted for concerns listed above. PTA pt reported that she has been unable to walk since June when she went to SNF. Her SNF provides assistance with all ADL's at this time. Prior to June pt reports that she was completely independent with all ADL's, IADL's, and functional mobility. Pt continuing to demonstrate increased weakness and decreased activity tolerance, unable to come to standing with max A and a RW, however bed mobility and sitting balance are supervision-min guard. Pt will benefit from returning to SNF to continue working with therapies to return to independence. Acute OT will follow to address deficits below, while pt is in the hospital.     Follow Up Recommendations  SNF    Equipment Recommendations  None recommended by OT    Recommendations for Other Services       Precautions / Restrictions Precautions Precautions: Fall Precaution Comments: L knee and hip pain Restrictions Weight Bearing Restrictions: No      Mobility Bed Mobility Overal bed mobility: Needs Assistance Bed Mobility: Sit to Supine;Rolling;Sidelying to  Sit Rolling: Supervision Sidelying to sit: Supervision   Sit to supine: Supervision   General bed mobility comments: supervision for safety, pt utilized bed rails    Transfers Overall transfer level: Needs assistance Equipment used: Rolling walker (2 wheeled) Transfers: Sit to/from Stand Sit to Stand: Max assist;+2 physical assistance;+2 safety/equipment;From elevated surface         General transfer comment: Pt unable to come to standing from elevated surface and RW. Pt may do better with a stedy    Balance Overall balance assessment: Needs assistance Sitting-balance support: Feet supported;No upper extremity supported Sitting balance-[name]: Good Sitting balance - Comments: static sitting and weight shifting supervision at most no LOB       Standing balance comment: Unable to stand - deferreing further attempts this session due to pain in L hip                           ADL either performed or assessed with clinical judgement   ADL Overall ADL's : Needs assistance/impaired Eating/Feeding: Set up;Independent;Sitting   Grooming: Set up;Sitting   Upper Body Bathing: Set up;Sitting   Lower Body Bathing: Maximal assistance   Upper Body Dressing : Min guard;Sitting   Lower Body Dressing: Maximal assistance;Sitting/lateral leans       Toileting- Clothing Manipulation and Hygiene: Total assistance;Bed level         General ADL Comments: Pt completeing bed mobility and able to sit EOB for 5+ minutes, 1 attempt to stand with RW, unable to clear bed and pt complaining of L hip pain  Vision Baseline Vision/History: Wears glasses Wears Glasses: At all times Patient Visual Report: No change from baseline Vision Assessment?: No apparent visual deficits     Perception Perception Perception Tested?: No   Praxis Praxis Praxis tested?: Not tested    Pertinent Vitals/Pain Pain Assessment: Faces Faces Pain Scale: Hurts a little bit Pain Location:  L hip and L knee with bed mobility Pain Descriptors / Indicators: Grimacing;Guarding;Discomfort Pain Intervention(s): Limited activity within patient's tolerance;Monitored during session;Repositioned     Hand Dominance Right   Extremity/Trunk Assessment Upper Extremity Assessment Upper Extremity Assessment: Overall WFL for tasks assessed   Lower Extremity Assessment Lower Extremity Assessment: Defer to PT evaluation   Cervical / Trunk Assessment Cervical / Trunk Assessment: Normal   Communication Communication Communication: No difficulties   Cognition Arousal/Alertness: Awake/alert Behavior During Therapy: WFL for tasks assessed/performed Overall Cognitive Status: Within Functional Limits for tasks assessed                                     General Comments  VSS, HR 106 sitting    Exercises Other Exercises Other Exercises: Leg lifts, supine, BLE, 5 reps Other Exercises: Heel slides, supine, BLE, 5 reps Other Exercises: Ankle pumps, supine, BLE, 5 reps   Shoulder Instructions      Home Living Family/patient expects to be discharged to:: Skilled nursing facility Living Arrangements: Alone Available Help at Discharge: Family;Available PRN/intermittently Type of Home: Apartment Home Access: Level entry     Home Layout: One level     Bathroom Shower/Tub: Chief Strategy Officer: Standard     Home Equipment: Grab bars - tub/shower          Prior Functioning/Environment Level of Independence: Needs assistance  Gait / Transfers Assistance Needed: pt reports she was able to stand for <67min, was not walking while at SNF, was not able to stand pivot to surfaces ADL's / Homemaking Assistance Needed: Pt reports she was having assistance with all ADL. Pt was not getting to [facility] at SNF, she was wearing diapers   Comments: prior to previous SNF admission pt was independent with ADL/IADL and self-care        OT Problem List: Decreased  strength;Decreased activity tolerance;Impaired balance (sitting and/or standing);Obesity;Pain      OT Treatment/Interventions: Self-care/ADL training;DME and/or AE instruction;Therapeutic activities;Therapeutic exercise;Patient/family education    OT Goals(Current goals can be found in the care plan section) Acute Rehab OT Goals Patient Stated Goal: to go back to SNF OT Goal Formulation: With patient Time For Goal Achievement: 01/03/21 Potential to Achieve Goals: Good ADL Goals Pt Will Transfer to Toilet: with mod assist;with +2 assist;stand pivot transfer Pt Will Perform Toileting - Clothing Manipulation and hygiene: with mod assist Pt/caregiver will Perform Home Exercise Program: Increased strength;Both right and left upper extremity;With written HEP provided;Independently Additional ADL Goal #1: Pt will come to standing with mod A and use of AD. Additional ADL Goal #2: Pt will tolerate standing for 5 mins to improve activity tolerance.  OT Frequency: Min 2X/week   Barriers to D/C:            Co-evaluation              AM-PAC OT "6 Clicks" Daily Activity     Outcome Measure Help from another person eating meals?: A Little Help from another person taking care of personal grooming?: A Little Help from another person toileting, which  includes using toliet, bedpan, or urinal?: Total Help from another person bathing (including washing, rinsing, drying)?: A Lot Help from another person to put on and taking off regular upper body clothing?: A Little Help from another person to put on and taking off regular lower body clothing?: Total 6 Click Score: 13   End of Session Equipment Utilized During Treatment: Gait belt;Rolling walker Nurse Communication: Mobility status  Activity Tolerance: Patient limited by pain Patient left: in bed;with call bell/phone within reach  OT Visit Diagnosis: Unsteadiness on feet (R26.81);Other abnormalities of gait and mobility (R26.89);Muscle  weakness (generalized) (M62.81)                Time: 5631-4970 OT Time Calculation (min): 28 min Charges:  OT General Charges $OT Visit: 1 Visit OT Evaluation $OT Eval Moderate Complexity: 1 Mod OT Treatments $Self Care/Home Management : 8-22 mins  Brittnee Gaetano H., OTR/L Acute Rehabilitation  Laura-Lee Villegas Elane Julyana [place] 12/20/2020, 10:11 AM

## 2020-12-21 DIAGNOSIS — K922 Gastrointestinal hemorrhage, unspecified: Principal | ICD-10-CM

## 2020-12-21 LAB — BPAM RBC
Blood Product Expiration Date: 202208042359
Blood Product Expiration Date: 202208042359
Blood Product Expiration Date: 202208042359
ISSUE DATE / TIME: 202207112315
ISSUE DATE / TIME: 202207141340
Unit Type and Rh: 600
Unit Type and Rh: 600
Unit Type and Rh: 600

## 2020-12-21 LAB — CBC WITH DIFFERENTIAL/PLATELET
Abs Immature Granulocytes: 0.09 10*3/uL — ABNORMAL HIGH (ref 0.00–0.07)
Basophils Absolute: 0 10*3/uL (ref 0.0–0.1)
Basophils Relative: 1 %
Eosinophils Absolute: 0.2 10*3/uL (ref 0.0–0.5)
Eosinophils Relative: 3 %
HCT: 27.1 % — ABNORMAL LOW (ref 36.0–46.0)
Hemoglobin: 8.3 g/dL — ABNORMAL LOW (ref 12.0–15.0)
Immature Granulocytes: 1 %
Lymphocytes Relative: 22 %
Lymphs Abs: 1.9 10*3/uL (ref 0.7–4.0)
MCH: 26.5 pg (ref 26.0–34.0)
MCHC: 30.6 g/dL (ref 30.0–36.0)
MCV: 86.6 fL (ref 80.0–100.0)
Monocytes Absolute: 0.6 10*3/uL (ref 0.1–1.0)
Monocytes Relative: 7 %
Neutro Abs: 5.5 10*3/uL (ref 1.7–7.7)
Neutrophils Relative %: 66 %
Platelets: 224 10*3/uL (ref 150–400)
RBC: 3.13 MIL/uL — ABNORMAL LOW (ref 3.87–5.11)
RDW: 17 % — ABNORMAL HIGH (ref 11.5–15.5)
WBC: 8.3 10*3/uL (ref 4.0–10.5)
nRBC: 0.4 % — ABNORMAL HIGH (ref 0.0–0.2)

## 2020-12-21 LAB — GLUCOSE, CAPILLARY
Glucose-Capillary: 158 mg/dL — ABNORMAL HIGH (ref 70–99)
Glucose-Capillary: 161 mg/dL — ABNORMAL HIGH (ref 70–99)
Glucose-Capillary: 163 mg/dL — ABNORMAL HIGH (ref 70–99)
Glucose-Capillary: 194 mg/dL — ABNORMAL HIGH (ref 70–99)

## 2020-12-21 LAB — TYPE AND SCREEN
ABO/RH(D): A NEG
Antibody Screen: NEGATIVE
Unit division: 0
Unit division: 0
Unit division: 0

## 2020-12-21 MED ORDER — POLYETHYLENE GLYCOL 3350 17 G PO PACK: 17.0000 g | PACK | Freq: Two times a day (BID) | ORAL | 0 refills | Status: AC

## 2020-12-21 NOTE — Progress Notes (Signed)
DISCHARGE NOTE [name] to be discharged Rehab per MD order To [facility]. Patient verbalized understanding.  Skin clean, dry and intact without evidence of skin break down, no evidence of skin tears noted. IV catheter discontinued intact. Site without signs and symptoms of complications. Dressing and pressure applied. Pt denies pain at the site currently. No complaints noted.  Patient free of lines, drains, and wounds.   Discharge packet assembled. An After Visit Summary (AVS) was printed and given to the EMS personnel. Patient escorted via stretcher and discharged to [facility] via ambulance. Report called to accepting facility; all questions and concerns addressed.   Velia Meyer, RN

## 2020-12-21 NOTE — Progress Notes (Signed)
Called report to [facility] on Business office voice mail.

## 2020-12-21 NOTE — TOC Transition Note (Addendum)
Transition of Care (TOC) - CM/SW Discharge Note *Discharged back to [facility] *Room 117A *Number for Report: [phone]   Patient Details  Name: Pamela [name] MRN: [id number] Date of Birth: 1956-05-30  Transition of Care [facility]) CM/SW Contact:  Cristobal Goldmann, LCSW Phone Number: 12/21/2020, 1:02 PM   Clinical Narrative:  Patient medically stable for discharge and is returning to [place] to continue ST rehab. Discharge clinicals transmitted to facility, and non-emergency ambulance transport arranged. When asked about contacting her brothers, patient informed CSW that she would call them when she gets back to the facility.   Final next level of care: Skilled Nursing Facility - [facility] - Room 117A Barriers to Discharge: Barriers Resolved   Patient Goals and CMS Choice Patient states her goals for this hospitalization and ongoing recovery are: Patient agreeable to returning to [place] to continue rehab. CMS Medicare.gov Compare Post Acute Care list provided to: n/a as patient returning to [facility] Choice offered to/list presented to: n/a   Discharge Placement - [facility]    Existing PASRR number confirmed : 12/20/20          Patient chooses bed at: Other - please specify in the comment section below: [facility]) Patient to be transferred to facility by: [facility] Name of family member notified: Brother Sindee Stucker - [phone] Patient and family notified of of transfer: 12/21/20  Discharge Plan and Services In-house Referral: Clinical Social Work   Post Acute Care Choice: Skilled Nursing Facility                              Social Determinants of Health (SDOH) Interventions  No SDOH interventions requested or needed at discharge.   Readmission Risk Interventions No flowsheet data found.

## 2020-12-21 NOTE — Progress Notes (Signed)
Gave report to [place] at [facility].

## 2020-12-21 NOTE — Discharge Summary (Signed)
Physician Discharge Summary  COOKIE PORE NLG:[id number] DOB: Sep 23, 1955 DOA: 12/17/2020  PCP: Corwin Levins, MD  Admit date: 12/17/2020 Discharge date: 12/21/2020  Admitted From: Home Disposition:  Home  Discharge Condition:Stable CODE STATUS:FULL Diet recommendation: Heart Healthy   Brief/Interim Summary:  Patient is a 77 7M female with history of diverticulosis, distal rectal ulcer, permanent A. fib on Eliquis, chronic diastolic congestive heart failure, type 2 diabetes, hypertension, morbid obesity who presented to the emergency department from skilled nursing facility due to multiple stools associated with bright red blood per rectum and also with some rectal discomfort while passing stool.  She was just discharged from here on 12/16/2020 after admitted for lower GI bleed at that time she had colonoscopy which showed nonbleeding stercoral ulcer and left colonic diverticulitis.  On presentation she was noticed to have bright red blood per rectum on rectal examination, positive FOBT.  Hemoglobin in the range of 9 and presentation, she was given a unit of blood transfusion.  GI consulted. There is no plan for further intervention as per GI and they have signed off.  We will transfused her with a unit of PRBC again on 12/20/20, hemoglobin stable in the range of 8.  No further rectal bleeding noticed.plan for discharge back to skilled nursing facility.  Following problems were addressed during her hospitalization:   Acute lower GI bleed: She was admitted and was managed for the same recently, discharged on 7/10.  Had a colonoscopy done on 7/8 which showed nonbleeding stercoral ulcer and left colon diverticulitis.  FOBT was positive.  Hemoglobin was noticed to be in the range of 9 on presentation, FOBT positive. She has been off Eliquis for more than a week, will resume Eliquis on dc.   GI following, no plan for further colonoscopy.  GI thinks that this is minor rectal bleeding and not  diverticular bleed. Continue MiraLAX twice daily   Paroxysmal A. fib: Currently Eliquis is on hold due to recent GI bleed. We will resume on dc.  Rate is controlled   CKD stage IIIb: Currently kidney function at baseline.  Her baseline creatinine is around 1.6.    Diabetes type 2: Recent hemoglobin A1c of 6.9.  Continue home regimen.   Diabetic polyneuropathy: On Lyrica   History of hyperlipidemia: On statin   Chronic diastolic congestive heart failure: Currently euvolemic.   Hypertension: Currently blood stable on Toprol, Cardizem   Morbid obesity: BMI 47.6   Debility/deconditioning/left knee and left hip pain: Currently staying at the skilled nursing facility.  Continue supportive care    Discharge Diagnoses:  Active Problems:   Normocytic anemia   Stercoral ulcer of rectum   GI bleed   Rectal bleeding    Discharge Instructions  Discharge Instructions     Diet - low sodium heart healthy   Complete by: As directed    Discharge instructions   Complete by: As directed    1) Please take your medications as instructed 2)Do a CBC test in a week to check your hemoglobin   Increase activity slowly   Complete by: As directed       Allergies as of 12/21/2020       Reactions   Cymbalta [duloxetine Hcl] Other (See Comments)   "loopiness"   Gabapentin Itching        Medication List     TAKE these medications    acetaminophen 325 MG tablet Commonly known as: TYLENOL Take 2 tablets (650 mg total) by mouth every 6 (six)  hours as needed for mild pain (or Fever >/= 101).   albuterol 108 (90 Base) MCG/ACT inhaler Commonly known as: VENTOLIN HFA Inhale 2 puffs into the lungs every 6 (six) hours as needed for wheezing or shortness of breath.   ammonium lactate 12 % lotion Commonly known as: LAC-HYDRIN Apply 1 application topically in the morning and at bedtime. Apply to feet,ankles and toes   Anusol-HC 2.5 % rectal cream Generic drug: hydrocortisone Place 1  application rectally See admin instructions. Apply to anal atrea bid x 5 days for hemorrhoids   apixaban 5 MG Tabs tablet Commonly known as: Eliquis Take 1 tablet (5 mg total) by mouth daily. What changed: when to take this   atorvastatin 40 MG tablet Commonly known as: LIPITOR TAKE 1 TABLET(40 MG) BY MOUTH DAILY What changed:  how much to take how to take this when to take this additional instructions   cetirizine 10 MG tablet Commonly known as: ZYRTEC 1 tab by mouth once daily as needed What changed:  how much to take how to take this when to take this reasons to take this additional instructions   diltiazem 360 MG 24 hr capsule Commonly known as: CARDIZEM CD TAKE 1 CAPSULE(360 MG) BY MOUTH DAILY What changed:  how much to take how to take this when to take this additional instructions   empagliflozin 25 MG Tabs tablet Commonly known as: Jardiance TAKE 1 TABLET BY MOUTH DAILY. What changed:  how much to take how to take this when to take this additional instructions   Fish Oil 1000 MG Caps Take 2 capsules (2,000 mg total) by mouth 2 (two) times daily.   furosemide 40 MG tablet Commonly known as: LASIX Take 1 tablet (40 mg total) by mouth daily. Needs appointment for further refills   glipiZIDE 10 MG 24 hr tablet Commonly known as: GLUCOTROL XL 1 tab by mouth once daily What changed:  how much to take how to take this when to take this additional instructions   meclizine 12.5 MG tablet Commonly known as: ANTIVERT TAKE 1 TABLET(12.5 MG) BY MOUTH THREE TIMES DAILY AS NEEDED FOR DIZZINESS What changed: See the new instructions.   metoprolol succinate 100 MG 24 hr tablet Commonly known as: TOPROL-XL Take 1 tablet (100 mg total) by mouth daily. Take with or immediately following a meal.   pioglitazone 30 MG tablet Commonly known as: Actos Take 1 tablet (30 mg total) by mouth daily.   polyethylene glycol 17 g packet Commonly known as: MIRALAX /  GLYCOLAX Take 17 g by mouth 2 (two) times daily. What changed: when to take this   pregabalin 50 MG capsule Commonly known as: LYRICA TAKE 1 CAPSULE(50 MG) BY MOUTH THREE TIMES DAILY What changed:  how much to take how to take this when to take this additional instructions   senna-docusate 8.6-50 MG tablet Commonly known as: Senokot-S Take 1 tablet by mouth 2 (two) times daily as needed for moderate constipation. What changed:  how much to take when to take this        Allergies  Allergen Reactions   Cymbalta [Duloxetine Hcl] Other (See Comments)    "loopiness"   Gabapentin Itching    Consultations: GI   Procedures/Studies: DG Chest Portable 1 View  Result Date: 12/17/2020 CLINICAL DATA:  Decreased left basilar breath sounds, initial encounter EXAM: PORTABLE CHEST 1 VIEW COMPARISON:  03/20/2020 FINDINGS: Cardiac shadow is enlarged but stable. Lungs are well aerated bilaterally. No focal infiltrate or effusion  is seen. No bony abnormality is noted. IMPRESSION: No acute abnormality seen. Electronically Signed   By: Alcide Clever M.D.   On: 12/17/2020 22:22      Subjective:  Patient seen and examined at the bedside this morning.  Hemodynamically stable for discharge.   Discharge Exam: Vitals:   12/21/20 0413 12/21/20 0908  BP: 104/67 131/72  Pulse: (!) 102 93  Resp: 18 19  Temp: 97.7 F (36.5 C) 97.8 F (36.6 C)  SpO2: 96% 95%   Vitals:   12/20/20 1631 12/20/20 2009 12/21/20 0413 12/21/20 0908  BP:  131/66 104/67 131/72  Pulse:  95 (!) 102 93  Resp:  Temp: 98.3 F (36.8 C) 99 F (37.2 C) 97.7 F (36.5 C) 97.8 F (36.6 C)  TempSrc:      SpO2:  93% 96% 95%  Weight:        General: Pt is alert, awake, not in acute distress Cardiovascular: RRR, S1/S2 +, no rubs, no gallops Respiratory: CTA bilaterally, no wheezing, no rhonchi Abdominal: Soft, NT, ND, bowel sounds + Extremities: no edema, no cyanosis    The results of significant  diagnostics from this hospitalization (including imaging, microbiology, ancillary and laboratory) are listed below for reference.     Microbiology: Recent Results (from the past 240 hour(s))  Resp Panel by RT-PCR (Flu A&B, Covid) Nasopharyngeal Swab     Status: None   Collection Time: 12/13/20  8:31 AM   Specimen: Nasopharyngeal Swab; Nasopharyngeal(NP) swabs in vial transport medium  Result Value Ref Range Status   SARS Coronavirus 2 by RT PCR NEGATIVE NEGATIVE Final    Comment: (NOTE) SARS-CoV-2 target nucleic acids are NOT DETECTED.  The SARS-CoV-2 RNA is generally detectable in upper respiratory specimens during the acute phase of infection. The lowest concentration of SARS-CoV-2 viral copies this assay can detect is 138 copies/mL. A negative result does not preclude SARS-Cov-2 infection and should not be used as the sole basis for treatment or other patient management decisions. A negative result may occur with  improper specimen collection/handling, submission of specimen other than nasopharyngeal swab, presence of viral mutation(s) within the areas targeted by this assay, and inadequate number of viral copies(<138 copies/mL). A negative result must be combined with clinical observations, patient history, and epidemiological information. The expected result is Negative.  Fact Sheet for Patients:  BloggerCourse.com  Fact Sheet for Healthcare Providers:  SeriousBroker.it  This test is no t yet approved or cleared by the [place] FDA and  has been authorized for detection and/or diagnosis of SARS-CoV-2 by FDA under an Emergency Use Authorization (EUA). This EUA will remain  in effect (meaning this test can be used) for the duration of the COVID-19 declaration under Section 564(b)(1) of the Act, 21 U.S.C.section 360bbb-3(b)(1), unless the authorization is terminated  or revoked sooner.       Influenza A by PCR NEGATIVE  NEGATIVE Final   Influenza B by PCR NEGATIVE NEGATIVE Final    Comment: (NOTE) The Xpert Xpress SARS-CoV-2/FLU/RSV plus assay is intended as an aid in the diagnosis of influenza from Nasopharyngeal swab specimens and should not be used as a sole basis for treatment. Nasal washings and aspirates are unacceptable for Xpert Xpress SARS-CoV-2/FLU/RSV testing.  Fact Sheet for Patients: BloggerCourse.com  Fact Sheet for Healthcare Providers: SeriousBroker.it  This test is not yet approved or cleared by the [place] FDA and has been authorized for detection and/or diagnosis of SARS-CoV-2 by FDA under an Emergency Use  Authorization (EUA). This EUA will remain in effect (meaning this test can be used) for the duration of the COVID-19 declaration under Section 564(b)(1) of the Act, 21 U.S.C. section 360bbb-3(b)(1), unless the authorization is terminated or revoked.  Performed at [facility], [address], [place], [place] [address]   Resp Panel by RT-PCR (Flu A&B, Covid) Nasopharyngeal Swab     Status: None   Collection Time: 12/16/20 10:58 AM   Specimen: Nasopharyngeal Swab; Nasopharyngeal(NP) swabs in vial transport medium  Result Value Ref Range Status   SARS Coronavirus 2 by RT PCR NEGATIVE NEGATIVE Final    Comment: (NOTE) SARS-CoV-2 target nucleic acids are NOT DETECTED.  The SARS-CoV-2 RNA is generally detectable in upper respiratory specimens during the acute phase of infection. The lowest concentration of SARS-CoV-2 viral copies this assay can detect is 138 copies/mL. A negative result does not preclude SARS-Cov-2 infection and should not be used as the sole basis for treatment or other patient management decisions. A negative result may occur with  improper specimen collection/handling, submission of specimen other than nasopharyngeal swab, presence of viral mutation(s) within the areas targeted by this assay,  and inadequate number of viral copies(<138 copies/mL). A negative result must be combined with clinical observations, patient history, and epidemiological information. The expected result is Negative.  Fact Sheet for Patients:  BloggerCourse.com  Fact Sheet for Healthcare Providers:  SeriousBroker.it  This test is no t yet approved or cleared by the [place] FDA and  has been authorized for detection and/or diagnosis of SARS-CoV-2 by FDA under an Emergency Use Authorization (EUA). This EUA will remain  in effect (meaning this test can be used) for the duration of the COVID-19 declaration under Section 564(b)(1) of the Act, 21 U.S.C.section 360bbb-3(b)(1), unless the authorization is terminated  or revoked sooner.       Influenza A by PCR NEGATIVE NEGATIVE Final   Influenza B by PCR NEGATIVE NEGATIVE Final    Comment: (NOTE) The Xpert Xpress SARS-CoV-2/FLU/RSV plus assay is intended as an aid in the diagnosis of influenza from Nasopharyngeal swab specimens and should not be used as a sole basis for treatment. Nasal washings and aspirates are unacceptable for Xpert Xpress SARS-CoV-2/FLU/RSV testing.  Fact Sheet for Patients: BloggerCourse.com  Fact Sheet for Healthcare Providers: SeriousBroker.it  This test is not yet approved or cleared by the [place] FDA and has been authorized for detection and/or diagnosis of SARS-CoV-2 by FDA under an Emergency Use Authorization (EUA). This EUA will remain in effect (meaning this test can be used) for the duration of the COVID-19 declaration under Section 564(b)(1) of the Act, 21 U.S.C. section 360bbb-3(b)(1), unless the authorization is terminated or revoked.  Performed at [facility], [address] [place], [place] [address]   SARS CORONAVIRUS 2 (TAT 6-24 HRS) Nasopharyngeal Nasopharyngeal Swab     Status: None    Collection Time: 12/17/20  9:20 PM   Specimen: Nasopharyngeal Swab  Result Value Ref Range Status   SARS Coronavirus 2 NEGATIVE NEGATIVE Final    Comment: (NOTE) SARS-CoV-2 target nucleic acids are NOT DETECTED.  The SARS-CoV-2 RNA is generally detectable in upper and lower respiratory specimens during the acute phase of infection. Negative results do not preclude SARS-CoV-2 infection, do not rule out co-infections with other pathogens, and should not be used as the sole basis for treatment or other patient management decisions. Negative results must be combined with clinical observations, patient history, and epidemiological information. The expected result  is Negative.  Fact Sheet for Patients: HairSlick.no  Fact Sheet for Healthcare Providers: quierodirigir.com  This test is not yet approved or cleared by the [place] FDA and  has been authorized for detection and/or diagnosis of SARS-CoV-2 by FDA under an Emergency Use Authorization (EUA). This EUA will remain  in effect (meaning this test can be used) for the duration of the COVID-19 declaration under Se ction 564(b)(1) of the Act, 21 U.S.C. section 360bbb-3(b)(1), unless the authorization is terminated or revoked sooner.  Performed at [facility], [address], [place], [place] [address]      Labs: BNP (last 3 results) No results for input(s): BNP in the last 8760 hours. Basic Metabolic Panel: Recent Labs  Lab 12/16/20 0330 12/17/20 2146 12/18/20 0340 12/18/20 1823 12/19/20 0731 12/20/20 0311  NA 136 134*  --  135 137 137  K 3.4* 4.3  --  4.0 3.3* 3.5  CL 102 102  --  101 105 104  CO2 28 23  --  23 28 29   GLUCOSE 220* 246*  --  259* 113* 145*  BUN 13 12  --  16 11 8   CREATININE 1.37* 1.34*  --  1.55* 1.24* 1.22*  CALCIUM 8.6* 8.6*  --  8.4* 8.1* 8.4*  MG  --   --  1.9  --   --   --   PHOS  --   --  3.7  --   --   --    Liver Function  Tests: Recent Labs  Lab 12/15/20 0512 12/17/20 2146  AST 12* 17  ALT 13 18  ALKPHOS 63 57  BILITOT 1.0 0.9  PROT 5.4* 5.3*  ALBUMIN 2.6* 2.6*   No results for input(s): LIPASE, AMYLASE in the last 168 hours. No results for input(s): AMMONIA in the last 168 hours. CBC: Recent Labs  Lab 12/17/20 2146 12/18/20 0341 12/18/20 1823 12/19/20 0104 12/19/20 0731 12/20/20 0311 12/20/20 1951 12/21/20 0502  WBC 14.8*  --  13.4*  --  10.8* 9.4  --  8.3  NEUTROABS 11.8*  --   --   --   --   --   --  5.5  HGB 9.1*   < > 7.9* 7.7* 7.4* 7.3* 7.9* 8.3*  HCT 30.0*   < > 26.2* 24.6* 23.6* 23.7* 25.8* 27.1*  MCV 86.5  --  85.6  --  84.0 85.3  --  86.6  PLT 331  --  227  --  232 230  --  224   < > = values in this interval not displayed.   Cardiac Enzymes: No results for input(s): CKTOTAL, CKMB, CKMBINDEX, TROPONINI in the last 168 hours. BNP: Invalid input(s): POCBNP CBG: Recent Labs  Lab 12/20/20 1151 12/20/20 1628 12/20/20 2008 12/21/20 0011 12/21/20 0408  GLUCAP 163* 168* 206* 163* 158*   D-Dimer No results for input(s): DDIMER in the last 72 hours. Hgb A1c No results for input(s): HGBA1C in the last 72 hours. Lipid Profile No results for input(s): CHOL, HDL, LDLCALC, TRIG, CHOLHDL, LDLDIRECT in the last 72 hours. Thyroid function studies No results for input(s): TSH, T4TOTAL, T3FREE, THYROIDAB in the last 72 hours.  Invalid input(s): FREET3 Anemia work up Recent Labs    12/20/20 1252  VITAMINB12 262  FERRITIN 46   Urinalysis    Component Value Date/Time   COLORURINE YELLOW 06/21/2020 1129   APPEARANCEUR CLEAR 06/21/2020 1129   LABSPEC 1.010 06/21/2020 1129   PHURINE 5.5 06/21/2020 1129  GLUCOSEU >=1000 (A) 06/21/2020 1129   HGBUR NEGATIVE 06/21/2020 1129   BILIRUBINUR NEGATIVE 06/21/2020 1129   KETONESUR NEGATIVE 06/21/2020 1129   UROBILINOGEN 0.2 06/21/2020 1129   NITRITE NEGATIVE 06/21/2020 1129   LEUKOCYTESUR NEGATIVE 06/21/2020 1129   Sepsis  Labs Invalid input(s): PROCALCITONIN,  WBC,  LACTICIDVEN Microbiology Recent Results (from the past 240 hour(s))  Resp Panel by RT-PCR (Flu A&B, Covid) Nasopharyngeal Swab     Status: None   Collection Time: 12/13/20  8:31 AM   Specimen: Nasopharyngeal Swab; Nasopharyngeal(NP) swabs in vial transport medium  Result Value Ref Range Status   SARS Coronavirus 2 by RT PCR NEGATIVE NEGATIVE Final    Comment: (NOTE) SARS-CoV-2 target nucleic acids are NOT DETECTED.  The SARS-CoV-2 RNA is generally detectable in upper respiratory specimens during the acute phase of infection. The lowest concentration of SARS-CoV-2 viral copies this assay can detect is 138 copies/mL. A negative result does not preclude SARS-Cov-2 infection and should not be used as the sole basis for treatment or other patient management decisions. A negative result may occur with  improper specimen collection/handling, submission of specimen other than nasopharyngeal swab, presence of viral mutation(s) within the areas targeted by this assay, and inadequate number of viral copies(<138 copies/mL). A negative result must be combined with clinical observations, patient history, and epidemiological information. The expected result is Negative.  Fact Sheet for Patients:  BloggerCourse.com  Fact Sheet for Healthcare Providers:  SeriousBroker.it  This test is no t yet approved or cleared by the [place] FDA and  has been authorized for detection and/or diagnosis of SARS-CoV-2 by FDA under an Emergency Use Authorization (EUA). This EUA will remain  in effect (meaning this test can be used) for the duration of the COVID-19 declaration under Section 564(b)(1) of the Act, 21 U.S.C.section 360bbb-3(b)(1), unless the authorization is terminated  or revoked sooner.       Influenza A by PCR NEGATIVE NEGATIVE Final   Influenza B by PCR NEGATIVE NEGATIVE Final    Comment:  (NOTE) The Xpert Xpress SARS-CoV-2/FLU/RSV plus assay is intended as an aid in the diagnosis of influenza from Nasopharyngeal swab specimens and should not be used as a sole basis for treatment. Nasal washings and aspirates are unacceptable for Xpert Xpress SARS-CoV-2/FLU/RSV testing.  Fact Sheet for Patients: BloggerCourse.com  Fact Sheet for Healthcare Providers: SeriousBroker.it  This test is not yet approved or cleared by the [place] FDA and has been authorized for detection and/or diagnosis of SARS-CoV-2 by FDA under an Emergency Use Authorization (EUA). This EUA will remain in effect (meaning this test can be used) for the duration of the COVID-19 declaration under Section 564(b)(1) of the Act, 21 U.S.C. section 360bbb-3(b)(1), unless the authorization is terminated or revoked.  Performed at [facility], [address] [place], [place] [address]   Resp Panel by RT-PCR (Flu A&B, Covid) Nasopharyngeal Swab     Status: None   Collection Time: 12/16/20 10:58 AM   Specimen: Nasopharyngeal Swab; Nasopharyngeal(NP) swabs in vial transport medium  Result Value Ref Range Status   SARS Coronavirus 2 by RT PCR NEGATIVE NEGATIVE Final    Comment: (NOTE) SARS-CoV-2 target nucleic acids are NOT DETECTED.  The SARS-CoV-2 RNA is generally detectable in upper respiratory specimens during the acute phase of infection. The lowest concentration of SARS-CoV-2 viral copies this assay can detect is 138 copies/mL. A negative result does not preclude SARS-Cov-2 infection and should not be used as the sole basis for treatment  or other patient management decisions. A negative result may occur with  improper specimen collection/handling, submission of specimen other than nasopharyngeal swab, presence of viral mutation(s) within the areas targeted by this assay, and inadequate number of viral copies(<138 copies/mL). A negative result  must be combined with clinical observations, patient history, and epidemiological information. The expected result is Negative.  Fact Sheet for Patients:  BloggerCourse.com  Fact Sheet for Healthcare Providers:  SeriousBroker.it  This test is no t yet approved or cleared by the [place] FDA and  has been authorized for detection and/or diagnosis of SARS-CoV-2 by FDA under an Emergency Use Authorization (EUA). This EUA will remain  in effect (meaning this test can be used) for the duration of the COVID-19 declaration under Section 564(b)(1) of the Act, 21 U.S.C.section 360bbb-3(b)(1), unless the authorization is terminated  or revoked sooner.       Influenza A by PCR NEGATIVE NEGATIVE Final   Influenza B by PCR NEGATIVE NEGATIVE Final    Comment: (NOTE) The Xpert Xpress SARS-CoV-2/FLU/RSV plus assay is intended as an aid in the diagnosis of influenza from Nasopharyngeal swab specimens and should not be used as a sole basis for treatment. Nasal washings and aspirates are unacceptable for Xpert Xpress SARS-CoV-2/FLU/RSV testing.  Fact Sheet for Patients: BloggerCourse.com  Fact Sheet for Healthcare Providers: SeriousBroker.it  This test is not yet approved or cleared by the [place] FDA and has been authorized for detection and/or diagnosis of SARS-CoV-2 by FDA under an Emergency Use Authorization (EUA). This EUA will remain in effect (meaning this test can be used) for the duration of the COVID-19 declaration under Section 564(b)(1) of the Act, 21 U.S.C. section 360bbb-3(b)(1), unless the authorization is terminated or revoked.  Performed at [facility], [address] [place], [place] [address]   SARS CORONAVIRUS 2 (TAT 6-24 HRS) Nasopharyngeal Nasopharyngeal Swab     Status: None   Collection Time: 12/17/20  9:20 PM   Specimen: Nasopharyngeal Swab   Result Value Ref Range Status   SARS Coronavirus 2 NEGATIVE NEGATIVE Final    Comment: (NOTE) SARS-CoV-2 target nucleic acids are NOT DETECTED.  The SARS-CoV-2 RNA is generally detectable in upper and lower respiratory specimens during the acute phase of infection. Negative results do not preclude SARS-CoV-2 infection, do not rule out co-infections with other pathogens, and should not be used as the sole basis for treatment or other patient management decisions. Negative results must be combined with clinical observations, patient history, and epidemiological information. The expected result is Negative.  Fact Sheet for Patients: HairSlick.no  Fact Sheet for Healthcare Providers: quierodirigir.com  This test is not yet approved or cleared by the [place] FDA and  has been authorized for detection and/or diagnosis of SARS-CoV-2 by FDA under an Emergency Use Authorization (EUA). This EUA will remain  in effect (meaning this test can be used) for the duration of the COVID-19 declaration under Se ction 564(b)(1) of the Act, 21 U.S.C. section 360bbb-3(b)(1), unless the authorization is terminated or revoked sooner.  Performed at [facility], [address], [place], [place] [address]     Please note: You were cared for by a hospitalist during your hospital stay. Once you are discharged, your primary care physician will handle any further medical issues. Please note that NO REFILLS for any discharge medications will be authorized once you are discharged, as it is imperative that you return to your primary care physician (or establish a relationship with a  primary care physician if you do not have one) for your post hospital discharge needs so that they can reassess your need for medications and monitor your lab values.    Time coordinating discharge: 40 minutes  SIGNED:   Burnadette Pop, MD  [facility] 12/21/2020, 11:08 AM Pager [phone]  If 7PM-7AM, please contact night-coverage [email or web address] Password [email or web address]

## 2020-12-26 ENCOUNTER — Other Ambulatory Visit: Payer: Self-pay

## 2020-12-26 ENCOUNTER — Emergency Department (HOSPITAL_COMMUNITY): Payer: Managed Care, Other (non HMO)

## 2020-12-26 ENCOUNTER — Encounter (HOSPITAL_COMMUNITY): Payer: Self-pay

## 2020-12-26 ENCOUNTER — Emergency Department (HOSPITAL_COMMUNITY)
Admission: EM | Admit: 2020-12-26 | Discharge: 2020-12-26 | Disposition: A | Discharge status: Home / Self Care | Payer: Managed Care, Other (non HMO) | Attending: Emergency Medicine | Admitting: Emergency Medicine

## 2020-12-26 DIAGNOSIS — N179 Acute kidney failure, unspecified: Secondary | ICD-10-CM | POA: Insufficient documentation

## 2020-12-26 DIAGNOSIS — Z7984 Long term (current) use of oral hypoglycemic drugs: Secondary | ICD-10-CM | POA: Diagnosis not present

## 2020-12-26 DIAGNOSIS — N183 Chronic kidney disease, stage 3 unspecified: Secondary | ICD-10-CM | POA: Insufficient documentation

## 2020-12-26 DIAGNOSIS — I5032 Chronic diastolic (congestive) heart failure: Secondary | ICD-10-CM | POA: Diagnosis not present

## 2020-12-26 DIAGNOSIS — K625 Hemorrhage of anus and rectum: Secondary | ICD-10-CM | POA: Diagnosis not present

## 2020-12-26 DIAGNOSIS — R059 Cough, unspecified: Secondary | ICD-10-CM | POA: Diagnosis present

## 2020-12-26 DIAGNOSIS — I130 Hypertensive heart and chronic kidney disease with heart failure and stage 1 through stage 4 chronic kidney disease, or unspecified chronic kidney disease: Secondary | ICD-10-CM | POA: Insufficient documentation

## 2020-12-26 DIAGNOSIS — E1122 Type 2 diabetes mellitus with diabetic chronic kidney disease: Secondary | ICD-10-CM | POA: Diagnosis not present

## 2020-12-26 DIAGNOSIS — Z79899 Other long term (current) drug therapy: Secondary | ICD-10-CM | POA: Diagnosis not present

## 2020-12-26 DIAGNOSIS — U071 COVID-19: Secondary | ICD-10-CM | POA: Insufficient documentation

## 2020-12-26 DIAGNOSIS — D649 Anemia, unspecified: Secondary | ICD-10-CM | POA: Diagnosis not present

## 2020-12-26 DIAGNOSIS — Z7901 Long term (current) use of anticoagulants: Secondary | ICD-10-CM | POA: Insufficient documentation

## 2020-12-26 LAB — COMPREHENSIVE METABOLIC PANEL
ALT: 16 U/L (ref 0–44)
AST: 19 U/L (ref 15–41)
Albumin: 2.5 g/dL — ABNORMAL LOW (ref 3.5–5.0)
Alkaline Phosphatase: 74 U/L (ref 38–126)
Anion gap: 10 (ref 5–15)
BUN: 11 mg/dL (ref 8–23)
CO2: 26 mmol/L (ref 22–32)
Calcium: 8.3 mg/dL — ABNORMAL LOW (ref 8.9–10.3)
Chloride: 97 mmol/L — ABNORMAL LOW (ref 98–111)
Creatinine, Ser: 1.75 mg/dL — ABNORMAL HIGH (ref 0.44–1.00)
GFR, Estimated: 32 mL/min — ABNORMAL LOW (ref >60)
Glucose, Bld: 119 mg/dL — ABNORMAL HIGH (ref 70–99)
Potassium: 3.5 mmol/L (ref 3.5–5.1)
Sodium: 133 mmol/L — ABNORMAL LOW (ref 135–145)
Total Bilirubin: 0.5 mg/dL (ref 0.3–1.2)
Total Protein: 5.2 g/dL — ABNORMAL LOW (ref 6.5–8.1)

## 2020-12-26 LAB — RESP PANEL BY RT-PCR (FLU A&B, COVID) ARPGX2
Influenza A by PCR: NEGATIVE
Influenza B by PCR: NEGATIVE
SARS Coronavirus 2 by RT PCR: POSITIVE — AB

## 2020-12-26 LAB — CBC WITH DIFFERENTIAL/PLATELET
Abs Immature Granulocytes: 0.08 10*3/uL — ABNORMAL HIGH (ref 0.00–0.07)
Basophils Absolute: 0.1 10*3/uL (ref 0.0–0.1)
Basophils Relative: 1 %
Eosinophils Absolute: 0.1 10*3/uL (ref 0.0–0.5)
Eosinophils Relative: 2 %
HCT: 25.4 % — ABNORMAL LOW (ref 36.0–46.0)
Hemoglobin: 7.5 g/dL — ABNORMAL LOW (ref 12.0–15.0)
Immature Granulocytes: 1 %
Lymphocytes Relative: 16 %
Lymphs Abs: 1.1 10*3/uL (ref 0.7–4.0)
MCH: 26 pg (ref 26.0–34.0)
MCHC: 29.5 g/dL — ABNORMAL LOW (ref 30.0–36.0)
MCV: 87.9 fL (ref 80.0–100.0)
Monocytes Absolute: 0.9 10*3/uL (ref 0.1–1.0)
Monocytes Relative: 14 %
Neutro Abs: 4.4 10*3/uL (ref 1.7–7.7)
Neutrophils Relative %: 66 %
Platelets: 288 10*3/uL (ref 150–400)
RBC: 2.89 MIL/uL — ABNORMAL LOW (ref 3.87–5.11)
RDW: 17.4 % — ABNORMAL HIGH (ref 11.5–15.5)
WBC: 6.7 10*3/uL (ref 4.0–10.5)
nRBC: 1.2 % — ABNORMAL HIGH (ref 0.0–0.2)

## 2020-12-26 LAB — TYPE AND SCREEN
ABO/RH(D): A NEG
Antibody Screen: NEGATIVE

## 2020-12-26 MED ORDER — SODIUM CHLORIDE 0.9 % IV SOLN: INTRAVENOUS | Status: DC | PRN

## 2020-12-26 MED ORDER — FERROUS SULFATE 325 (65 FE) MG PO TABS
325.0000 mg | ORAL_TABLET | Freq: Every day | ORAL | 0 refills | Status: AC
Start: 2020-12-26 — End: ?

## 2020-12-26 MED ORDER — FAMOTIDINE IN NACL 20-0.9 MG/50ML-% IV SOLN: 20.0000 mg | Freq: Once | INTRAVENOUS | Status: DC | PRN

## 2020-12-26 MED ORDER — BEBTELOVIMAB 175 MG/2 ML IV (EUA)
175.0000 mg | Freq: Once | INTRAMUSCULAR | Status: AC
  Administered 2020-12-26: 175 mg via INTRAVENOUS
  Filled 2020-12-26: qty 2

## 2020-12-26 MED ORDER — FERROUS SULFATE 325 (65 FE) MG PO TABS
325.0000 mg | ORAL_TABLET | Freq: Once | ORAL | Status: AC
  Administered 2020-12-26: 325 mg via ORAL
  Filled 2020-12-26: qty 1

## 2020-12-26 MED ORDER — DIPHENHYDRAMINE HCL 50 MG/ML IJ SOLN: 50.0000 mg | Freq: Once | INTRAMUSCULAR | Status: DC | PRN

## 2020-12-26 MED ORDER — EPINEPHRINE 0.3 MG/0.3ML IJ SOAJ: 0.3000 mg | Freq: Once | INTRAMUSCULAR | Status: DC | PRN

## 2020-12-26 MED ORDER — METHYLPREDNISOLONE SODIUM SUCC 125 MG IJ SOLR: 125.0000 mg | Freq: Once | INTRAMUSCULAR | Status: DC | PRN

## 2020-12-26 MED ORDER — SODIUM CHLORIDE 0.9 % IV BOLUS
500.0000 mL | Freq: Once | INTRAVENOUS | Status: AC
  Administered 2020-12-26: 500 mL via INTRAVENOUS

## 2020-12-26 MED ORDER — ALBUTEROL SULFATE HFA 108 (90 BASE) MCG/ACT IN AERS: 2.0000 | INHALATION_SPRAY | Freq: Once | RESPIRATORY_TRACT | Status: DC | PRN

## 2020-12-26 NOTE — ED Triage Notes (Signed)
Pt arrived via [facility] from [place] for a low hemoglobin per staff to EMS. Staff wouldn't tell ems how low or show them the results of bw. Pt has cough and sore throatx1 day. Pt is A&Ox4. A-fib w/BBB on monitor. VSS

## 2020-12-26 NOTE — Discharge Instructions (Addendum)
You have COVID infection and will need to be on isolation for 10 days  You received monoclonal antibody infusion today  Take Tylenol as needed for fever.  Take iron supplement to help with your hemoglobin.  Stop taking Eliquis for 5 days.  Recheck CBC and BMP in a week   Use albuterol 2 puffs every 4 hours as needed for cough.  See your doctor for follow up   Return to ER if you have worse trouble breathing, fever, worse rectal bleeding, abdominal pain

## 2020-12-26 NOTE — ED Provider Notes (Addendum)
[facility] EMERGENCY DEPARTMENT Provider Note   CSN: [id number] Arrival date & time: 12/26/20  1905     History Chief Complaint  Patient presents with   low hemoglobin    Pamela [name] is a 65 y.o. female here presenting with low hemoglobin.  Patient was just admitted for GI bleed.  She had a colonoscopy that showed sterocoral ulcer and non bleeding diverticulosis.  Patient was transfused several units and restarted on Eliquis.  Patient is at [facility] nursing home.  Patient continues to have blood in her diaper.  Patient had a hemoglobin checked today that was low so he was sent here for further evaluation.  Patient also had a new cough that started yesterday.  Patient denies any shortness of breath  The history is provided by the patient.      Past Medical History:  Diagnosis Date   ALLERGIC RHINITIS 12/31/2007   ANXIETY 12/31/2007   Atrial fibrillation (HCC)    DEPRESSION 12/31/2007   DIABETES MELLITUS, TYPE II 12/31/2007   Diastolic dysfunction 10/11/2010   Hyperlipidemia 04/23/2012   HYPERTENSION 12/31/2007   Migraine    PNA (pneumonia)    in her 20s   Right knee DJD    Rosacea 10/11/2010    Patient Active Problem List   Diagnosis Date Noted   Rectal bleeding    GI bleed 12/18/2020   Stercoral ulcer of rectum    Acute lower GI bleeding 12/13/2020   Chronic diastolic CHF (congestive heart failure) (HCC) 11/24/2020   Non-traumatic rhabdomyolysis 11/24/2020   Controlled type 2 diabetes mellitus with hyperglycemia (HCC) 11/24/2020   ARF (acute renal failure) (HCC) 11/18/2020   Elevated CK 11/18/2020   Acute pain of left knee 11/18/2020   AKI (acute kidney injury) (HCC) 11/18/2020   Chronic diastolic heart failure (HCC) 06/24/2020   Debility 06/21/2020   Vertigo 03/20/2020   SOB (shortness of breath) 03/20/2020   Left shoulder pain 02/27/2019   Acute upper respiratory infection 09/07/2018   Low back pain 12/01/2017   Diabetic foot ulcer (HCC)  03/03/2017   Charcot foot due to diabetes mellitus (HCC) 02/28/2017   Diabetic ulcer of toe of left foot associated with type 2 diabetes mellitus, limited to breakdown of skin (HCC) 02/28/2017   Obesity, Class III, BMI 40-49.9 (morbid obesity) (HCC) 02/28/2017   Chronic atrial fibrillation (HCC) 12/08/2015   Navicular fracture of ankle with malunion 05/30/2015   Neuropathic pain of foot 05/30/2015   Navicular fracture, foot 05/02/2015   Right ankle pain 04/19/2015   Cough 11/17/2014   Fatigue 09/25/2014   Ganglion cyst 02/06/2014   Bilateral hand pain 11/29/2012   Normocytic anemia 04/23/2012   CKD (chronic kidney disease) stage 3, GFR 30-59 ml/min (HCC) 04/23/2012   Hyperlipidemia 04/23/2012   Peripheral neuropathy 01/16/2012   DJD (degenerative joint disease) of knee 04/15/2011   Migraine 04/15/2011   Acute on chronic diastolic heart failure (HCC) 10/28/2010   Rosacea 10/11/2010   Anticoagulated 10/11/2010   Encounter for preventative adult health care exam with abnormal findings 10/06/2010   Anxiety state 12/31/2007   Depression 12/31/2007   Essential hypertension 12/31/2007   ALLERGIC RHINITIS 12/31/2007    Past Surgical History:  Procedure Laterality Date   2 D&C     1980s   COLONOSCOPY WITH PROPOFOL N/A 12/14/2020   Procedure: COLONOSCOPY WITH PROPOFOL;  Surgeon: Hilarie Fredrickson, MD;  Location: [facility] ENDOSCOPY;  Service: Endoscopy;  Laterality: N/A;     OB History   No  obstetric history on file.     Family History  Problem Relation Age of Onset   Arthritis Other    Cancer Other        breast   Heart disease Other    Stroke Other    Mental illness Other        emotional   Diabetes Other     Social History   Tobacco Use   Smoking status: Never   Smokeless tobacco: Never  Substance Use Topics   Alcohol use: Not Currently    Comment: social   Drug use: No    Home Medications Prior to Admission medications   Medication Sig Start Date End Date Taking?  Authorizing Provider  albuterol (VENTOLIN HFA) 108 (90 Base) MCG/ACT inhaler Inhale 2 puffs into the lungs every 6 (six) hours as needed for wheezing or shortness of breath. 06/21/20  Yes Corwin LevinsJohn, James W, MD  ammonium lactate (LAC-HYDRIN) 12 % lotion Apply 1 application topically in the morning and at bedtime. Apply to feet,ankles and toes   Yes [provider]  acetaminophen (TYLENOL) 325 MG tablet Take 2 tablets (650 mg total) by mouth every 6 (six) hours as needed for mild pain (or Fever >/= 101). 11/24/20   Drema DallasWoods, Curtis J, MD  apixaban (ELIQUIS) 5 MG TABS tablet Take 1 tablet (5 mg total) by mouth daily. 12/18/20   Zannie CoveJoseph, Preetha, MD  atorvastatin (LIPITOR) 40 MG tablet TAKE 1 TABLET(40 MG) BY MOUTH DAILY 06/21/20   Corwin LevinsJohn, James W, MD  cetirizine (ZYRTEC) 10 MG tablet 1 tab by mouth once daily as needed 06/21/20   Corwin LevinsJohn, James W, MD  diltiazem (CARDIZEM CD) 360 MG 24 hr capsule TAKE 1 CAPSULE(360 MG) BY MOUTH DAILY 06/21/20   Corwin LevinsJohn, James W, MD  empagliflozin (JARDIANCE) 25 MG TABS tablet TAKE 1 TABLET BY MOUTH DAILY. 06/21/20   Corwin LevinsJohn, James W, MD  furosemide (LASIX) 40 MG tablet Take 1 tablet (40 mg total) by mouth daily. Needs appointment for further refills 09/06/20   Laurey MoraleMcLean, Dalton S, MD  glipiZIDE (GLUCOTROL XL) 10 MG 24 hr tablet 1 tab by mouth once daily 06/21/20   Corwin LevinsJohn, James W, MD  hydrocortisone (ANUSOL-HC) 2.5 % rectal cream Place 1 application rectally See admin instructions. Apply to anal atrea bid x 5 days for hemorrhoids    [provider]  meclizine (ANTIVERT) 12.5 MG tablet TAKE 1 TABLET(12.5 MG) BY MOUTH THREE TIMES DAILY AS NEEDED FOR DIZZINESS 09/15/20   Corwin LevinsJohn, James W, MD  metoprolol succinate (TOPROL-XL) 100 MG 24 hr tablet Take 1 tablet (100 mg total) by mouth daily. Take with or immediately following a meal. 06/21/20   Corwin LevinsJohn, James W, MD  Omega-3 Fatty Acids (FISH OIL) 1000 MG CAPS Take 2 capsules (2,000 mg total) by mouth 2 (two) times daily. 08/19/17   Laurey MoraleMcLean, Dalton S, MD   pioglitazone (ACTOS) 30 MG tablet Take 1 tablet (30 mg total) by mouth daily. 06/24/20   Corwin LevinsJohn, James W, MD  polyethylene glycol (MIRALAX / GLYCOLAX) 17 g packet Take 17 g by mouth 2 (two) times daily. 12/21/20   Burnadette PopAdhikari, Amrit, MD  pregabalin (LYRICA) 50 MG capsule TAKE 1 CAPSULE(50 MG) BY MOUTH THREE TIMES DAILY 06/21/20   Corwin LevinsJohn, James W, MD  senna-docusate (SENOKOT-S) 8.6-50 MG tablet Take 1 tablet by mouth 2 (two) times daily as needed for moderate constipation. 11/24/20   Drema DallasWoods, Curtis J, MD  rivaroxaban (XARELTO) 20 MG TABS tablet Take 1 tablet (20 mg total) by mouth  daily with supper. Patient not taking: Reported on 12/13/2020 11/24/20 12/13/20  Drema Dallas, MD    Allergies    Cymbalta [duloxetine hcl] and Gabapentin  Review of Systems   Review of Systems  Gastrointestinal:  Positive for blood in stool.  All other systems reviewed and are negative.  Physical Exam Updated Vital Signs BP 114/60   Pulse 77   Temp 98.3 F (36.8 C) (Oral)   Resp 14   Ht 6' (1.829 m)   Wt (!) 158.9 kg   LMP 09/08/2010 (Exact Date)   SpO2 92%   BMI 47.51 kg/m   Physical Exam Vitals and nursing note reviewed.  Constitutional:      Comments: Chronically ill and pale and obese  HENT:     Head: Normocephalic.     Mouth/Throat:     Mouth: Mucous membranes are dry.  Eyes:     Extraocular Movements: Extraocular movements intact.     Comments: Conjunctiva is pale  Cardiovascular:     Rate and Rhythm: Normal rate and regular rhythm.     Pulses: Normal pulses.  Pulmonary:     Effort: Pulmonary effort is normal.     Breath sounds: Normal breath sounds.  Abdominal:     General: Abdomen is flat.     Palpations: Abdomen is soft.  Genitourinary:    Comments: Blood in the diaper  Musculoskeletal:     Cervical back: Normal range of motion and neck supple.  Skin:    General: Skin is warm.     Capillary Refill: Capillary refill takes less than 2 seconds.  Neurological:     General: No focal deficit  present.    ED Results / Procedures / Treatments   Labs (all labs ordered are listed, but only abnormal results are displayed) Labs Reviewed  CBC WITH DIFFERENTIAL/PLATELET - Abnormal; Notable for the following components:      Result Value   RBC 2.89 (*)    Hemoglobin 7.5 (*)    HCT 25.4 (*)    MCHC 29.5 (*)    RDW 17.4 (*)    nRBC 1.2 (*)    Abs Immature Granulocytes 0.08 (*)    All other components within normal limits  COMPREHENSIVE METABOLIC PANEL - Abnormal; Notable for the following components:   Sodium 133 (*)    Chloride 97 (*)    Glucose, Bld 119 (*)    Creatinine, Ser 1.75 (*)    Calcium 8.3 (*)    Total Protein 5.2 (*)    Albumin 2.5 (*)    GFR, Estimated 32 (*)    All other components within normal limits  RESP PANEL BY RT-PCR (FLU A&B, COVID) [id number]  TYPE AND SCREEN    EKG EKG Interpretation  Date/Time:  Wednesday December 26 2020 19:06:34 EDT Ventricular Rate:  87 PR Interval:    QRS Duration: 112 QT Interval:  422 QTC Calculation: 508 R Axis:   94 Text Interpretation: Atrial fibrillation Borderline intraventricular conduction delay Low voltage, precordial leads Borderline repolarization abnormality Prolonged QT interval No previous ECGs available Confirmed by Richardean Canal ([id number]) on 12/26/2020 7:17:57 PM  Radiology DG Chest Port 1 View  Result Date: 12/26/2020 CLINICAL DATA:  Cough EXAM: PORTABLE CHEST 1 VIEW COMPARISON:  12/17/2020 FINDINGS: Cardiomegaly with vascular congestion. Linear atelectasis or scar in the left mid lung. No consolidation, pleural effusion or pneumothorax. IMPRESSION: Cardiomegaly with mild central congestion Electronically Signed   By: Jasmine Pang M.D.   On: 12/26/2020 21:33  Procedures Procedures   Medications Ordered in ED Medications  ferrous sulfate tablet 325 mg (325 mg Oral Given 12/26/20 2046)  sodium chloride 0.9 % bolus 500 mL (0 mLs Intravenous Stopped 12/26/20 2216)    ED Course  I have reviewed the triage  vital signs and the nursing notes.  Pertinent labs & imaging results that were available during my care of the patient were reviewed by me and considered in my medical decision making (see chart for details).    MDM Rules/Calculators/A&P                          Pamela [name] is a 65 y.o. female here presenting with rectal bleeding.  Patient has bright red blood per rectum and colonoscopy was done recently and no obvious source was identified.  Patient is on Eliquis.  Patient apparently has a low hemoglobin.  We will repeat CBC and if it is low she will need transfusion and possible admission.   10:19 PM Patient's hemoglobin is stable at 7.5.  Creatinine went up to 1.75.  Given 500 cc bolus.  Since her hemoglobin is stable, I do not think she needs admission for transfusion.  Patient continues to have bright red blood per rectum.  We will hold Eliquis for several days and put her on iron supplement.  Of note, patient does have a mild cough and COVID test is positive.  However her chest x-ray did not show any obvious pneumonia.  She has no oxygen requirement.  I talked to the ED pharmacist and she will qualify for monoclonal antibody infusion. Stable for dc back to facility.    Final Clinical Impression(s) / ED Diagnoses Final diagnoses:  Anemia, unspecified type  AKI (acute kidney injury) [facility])    Rx / DC Orders ED Discharge Orders     None        Charlynne Pander, MD 12/26/20 2221    Charlynne Pander, MD 12/26/20 2227

## 2020-12-26 NOTE — ED Notes (Signed)
Attempted report to [facility] x2.  

## 2020-12-26 NOTE — ED Notes (Signed)
Attempted report to [facility] x1.  

## 2020-12-26 NOTE — ED Notes (Signed)
PT DC to [facility] with PT[facility].

## 2021-01-17 ENCOUNTER — Telehealth: Payer: Self-pay | Admitting: Internal Medicine

## 2021-01-17 NOTE — Telephone Encounter (Signed)
Please consider ROV as I have not seen her since jan 2022, and she has been hospd 3 times since then

## 2021-01-17 NOTE — Telephone Encounter (Signed)
Patient brother calling in concerned about patients well being  Having trouble w/ arthiritis.. having trouble standing for long periods of time  Patient has been in assisted living since June  Physical therapist at assisting living facility says patient was refusing PT   Needing advice on how to care for patient & wants to know if patient qualifies for home assistant [phone]

## 2021-01-21 NOTE — Telephone Encounter (Signed)
Brother called to notify him that pt will need to be seen in person since she has been hospitalized several times since Jan.  Tim states pt is unable to physically come to office.  Offered video visit, however, mychart has not been set up.  Resent code to [phone] for sign up prior to VV on 8/23.  Stressed the importance of being able to see pt vs phone call.  Tim verb understanding.

## 2021-01-29 ENCOUNTER — Telehealth (INDEPENDENT_AMBULATORY_CARE_PROVIDER_SITE_OTHER): Payer: Managed Care, Other (non HMO) | Admitting: Internal Medicine

## 2021-01-29 ENCOUNTER — Encounter: Payer: Self-pay | Admitting: Internal Medicine

## 2021-01-29 DIAGNOSIS — R5383 Other fatigue: Secondary | ICD-10-CM

## 2021-01-29 DIAGNOSIS — D649 Anemia, unspecified: Secondary | ICD-10-CM | POA: Diagnosis not present

## 2021-01-29 DIAGNOSIS — Z7901 Long term (current) use of anticoagulants: Secondary | ICD-10-CM

## 2021-01-29 DIAGNOSIS — R059 Cough, unspecified: Secondary | ICD-10-CM

## 2021-01-29 NOTE — Assessment & Plan Note (Signed)
Lab Results  Component Value Date   WBC 6.7 12/26/2020   HGB 7.5 (L) 12/26/2020   HCT 25.4 (L) 12/26/2020   MCV 87.9 12/26/2020   PLT 288 12/26/2020   Pt with severe anemia last check, unclear if taking iron, had recent f/u labs with data not available at this time, encouraged pt to f/u with provider on most recent hgb

## 2021-01-29 NOTE — Assessment & Plan Note (Signed)
Likely multifactor with hx of prolonged bedrest, obesity, depression, recent covid infection and severe anemia; encouraged pt to continue efforts at working with PT and OT

## 2021-01-29 NOTE — Progress Notes (Signed)
Patient ID: Pamela [name], female   DOB: Jul 09, 1955, 65 y.o.   MRN: [id number]  Virtual Visit via Video Note  I connected with Pamela [name] on 01/29/21 at  2:40 PM EDT by a video enabled telemedicine application and verified that I am speaking with the correct person using two identifiers.  Location of all participants today Patient: at facility Provider: at office   I discussed the limitations of evaluation and management by telemedicine and the availability of in person appointments. The patient expressed understanding and agreed to proceed.  History of Present Illness: Here to f/u post recent lower GI bleeding, denies overt bleeding in the past 4 wks, last hgb 7.5, pt reports lab done to f/u 2-3 days ago by NP at the facility, but she is not sure of results. Tolerating eliquis.  Colonoscopy with stercoral ulcer and non bleeding diverticulosis July 2022.  Pt also c/o persistent fatigue and non progressing with PT and OT at the facility, quite frustrated, has not walked for 2 month, and today with feverish, slight prod cough and CXR reportedly c/w right side PNA, started azithromycin today by report.  No overt aspiration per pt.  In addition had recent covid infection july 2022, with persistent fatigue as well, though no n/v or diarrhea.   Pt denies polydipsia, polyuria, or new focal neuro s/s.   Past Medical History:  Diagnosis Date   ALLERGIC RHINITIS 12/31/2007   ANXIETY 12/31/2007   Atrial fibrillation (HCC)    DEPRESSION 12/31/2007   DIABETES MELLITUS, TYPE II 12/31/2007   Diastolic dysfunction 10/11/2010   Hyperlipidemia 04/23/2012   HYPERTENSION 12/31/2007   Migraine    PNA (pneumonia)    in her 20s   Right knee DJD    Rosacea 10/11/2010   Past Surgical History:  Procedure Laterality Date   2 D&C     1980s   COLONOSCOPY WITH PROPOFOL N/A 12/14/2020   Procedure: COLONOSCOPY WITH PROPOFOL;  Surgeon: Hilarie Fredrickson, MD;  Location: [facility] ENDOSCOPY;  Service: Endoscopy;  Laterality: N/A;     reports that she has never smoked. She has never used smokeless tobacco. She reports that she does not currently use alcohol. She reports that she does not use drugs. family history includes Arthritis in an other family member; Cancer in an other family member; Diabetes in an other family member; Heart disease in an other family member; Mental illness in an other family member; Stroke in an other family member. Allergies  Allergen Reactions   Cymbalta [Duloxetine Hcl] Other (See Comments)    "loopiness"   Gabapentin Itching   Current Outpatient Medications on File Prior to Visit  Medication Sig Dispense Refill   acetaminophen (TYLENOL) 325 MG tablet Take 2 tablets (650 mg total) by mouth every 6 (six) hours as needed for mild pain (or Fever >/= 101). 30 tablet 0   albuterol (VENTOLIN HFA) 108 (90 Base) MCG/ACT inhaler Inhale 2 puffs into the lungs every 6 (six) hours as needed for wheezing or shortness of breath. 1 each 0   ammonium lactate (LAC-HYDRIN) 12 % lotion Apply 1 application topically in the morning and at bedtime. Apply to feet,ankles and toes     apixaban (ELIQUIS) 5 MG TABS tablet Take 1 tablet (5 mg total) by mouth daily.     atorvastatin (LIPITOR) 40 MG tablet TAKE 1 TABLET(40 MG) BY MOUTH DAILY 90 tablet 3   cetirizine (ZYRTEC) 10 MG tablet 1 tab by mouth once daily as needed 90 tablet 3  diltiazem (CARDIZEM CD) 360 MG 24 hr capsule TAKE 1 CAPSULE(360 MG) BY MOUTH DAILY 90 capsule 3   empagliflozin (JARDIANCE) 25 MG TABS tablet TAKE 1 TABLET BY MOUTH DAILY. 90 tablet 3   ferrous sulfate 325 (65 FE) MG tablet Take 1 tablet (325 mg total) by mouth daily. 30 tablet 0   furosemide (LASIX) 40 MG tablet Take 1 tablet (40 mg total) by mouth daily. Needs appointment for further refills 30 tablet 0   glipiZIDE (GLUCOTROL XL) 10 MG 24 hr tablet 1 tab by mouth once daily 90 tablet 3   hydrocortisone (ANUSOL-HC) 2.5 % rectal cream Place 1 application rectally See admin instructions. Apply  to anal atrea bid x 5 days for hemorrhoids     meclizine (ANTIVERT) 12.5 MG tablet TAKE 1 TABLET(12.5 MG) BY MOUTH THREE TIMES DAILY AS NEEDED FOR DIZZINESS 60 tablet 0   metoprolol succinate (TOPROL-XL) 100 MG 24 hr tablet Take 1 tablet (100 mg total) by mouth daily. Take with or immediately following a meal. 90 tablet 3   Omega-3 Fatty Acids (FISH OIL) 1000 MG CAPS Take 2 capsules (2,000 mg total) by mouth 2 (two) times daily. 60 capsule 0   pioglitazone (ACTOS) 30 MG tablet Take 1 tablet (30 mg total) by mouth daily. 90 tablet 3   polyethylene glycol (MIRALAX / GLYCOLAX) 17 g packet Take 17 g by mouth 2 (two) times daily. 14 each 0   pregabalin (LYRICA) 50 MG capsule TAKE 1 CAPSULE(50 MG) BY MOUTH THREE TIMES DAILY 270 capsule 1   senna-docusate (SENOKOT-S) 8.6-50 MG tablet Take 1 tablet by mouth 2 (two) times daily as needed for moderate constipation. 30 tablet 0   [DISCONTINUED] rivaroxaban (XARELTO) 20 MG TABS tablet Take 1 tablet (20 mg total) by mouth daily with supper. (Patient not taking: Reported on 12/13/2020) 30 tablet 0   No current facility-administered medications on file prior to visit.    Observations/Objective: Alert, mild ill fatigued appaering, appropriate mood and affect, resps normal, cn 2-12 intact, moves all 4s, no visible rash or swelling Lab Results  Component Value Date   WBC 6.7 12/26/2020   HGB 7.5 (L) 12/26/2020   HCT 25.4 (L) 12/26/2020   PLT 288 12/26/2020   GLUCOSE 119 (H) 12/26/2020   CHOL 191 06/21/2020   TRIG 110.0 06/21/2020   HDL 63.90 06/21/2020   LDLDIRECT 120.3 04/22/2012   LDLCALC 105 (H) 06/21/2020   ALT 16 12/26/2020   AST 19 12/26/2020   NA 133 (L) 12/26/2020   K 3.5 12/26/2020   CL 97 (L) 12/26/2020   CREATININE 1.75 (H) 12/26/2020   BUN 11 12/26/2020   CO2 26 12/26/2020   TSH 1.98 06/21/2020   INR 1.2 12/18/2020   HGBA1C 6.9 (H) 11/18/2020   MICROALBUR 4.5 (H) 06/21/2020   Assessment and Plan: See notes  Follow Up  Instructions: See notes  I discussed the assessment and treatment plan with the patient. The patient was provided an opportunity to ask questions and all were answered. The patient agreed with the plan and demonstrated an understanding of the instructions.   The patient was advised to call back or seek an in-person evaluation if the symptoms worsen or if the condition fails to improve as anticipated.  Oliver Barre, MD

## 2021-01-29 NOTE — Assessment & Plan Note (Signed)
To continue eliquis as was changed from xarelto,l  to f/u any worsening symptoms or concerns

## 2021-01-29 NOTE — Assessment & Plan Note (Addendum)
Pt to continue azithromycin as per facility provider for reported right pneumonia,  to f/u any worsening symptoms or concerns

## 2021-01-29 NOTE — Patient Instructions (Signed)
Please continue all other medications as before, and refills have been done if requested.  Please have the pharmacy call with any other refills you may need.  Please continue your efforts at being more active, low cholesterol diet, and weight control.  Please keep your appointments with your specialists as you may have planned     

## 2021-02-08 ENCOUNTER — Inpatient Hospital Stay (HOSPITAL_COMMUNITY)
Admission: EM | Admit: 2021-02-08 | Discharge: 2021-03-19 | DRG: 603 | Disposition: A | Discharge status: Skilled Nursing Facility | Payer: Managed Care, Other (non HMO) | Attending: Internal Medicine | Admitting: Internal Medicine

## 2021-02-08 ENCOUNTER — Emergency Department (HOSPITAL_COMMUNITY): Payer: Managed Care, Other (non HMO)

## 2021-02-08 ENCOUNTER — Telehealth: Payer: Self-pay | Admitting: Internal Medicine

## 2021-02-08 ENCOUNTER — Other Ambulatory Visit: Payer: Self-pay

## 2021-02-08 ENCOUNTER — Encounter (HOSPITAL_COMMUNITY): Payer: Self-pay | Admitting: *Deleted

## 2021-02-08 DIAGNOSIS — E1159 Type 2 diabetes mellitus with other circulatory complications: Secondary | ICD-10-CM | POA: Diagnosis present

## 2021-02-08 DIAGNOSIS — J302 Other seasonal allergic rhinitis: Secondary | ICD-10-CM | POA: Diagnosis present

## 2021-02-08 DIAGNOSIS — I152 Hypertension secondary to endocrine disorders: Secondary | ICD-10-CM | POA: Diagnosis present

## 2021-02-08 DIAGNOSIS — Z833 Family history of diabetes mellitus: Secondary | ICD-10-CM

## 2021-02-08 DIAGNOSIS — F419 Anxiety disorder, unspecified: Secondary | ICD-10-CM | POA: Diagnosis present

## 2021-02-08 DIAGNOSIS — M25562 Pain in left knee: Secondary | ICD-10-CM | POA: Diagnosis present

## 2021-02-08 DIAGNOSIS — Z6841 Body Mass Index (BMI) 40.0 and over, adult: Secondary | ICD-10-CM

## 2021-02-08 DIAGNOSIS — E785 Hyperlipidemia, unspecified: Secondary | ICD-10-CM | POA: Diagnosis present

## 2021-02-08 DIAGNOSIS — G8929 Other chronic pain: Secondary | ICD-10-CM | POA: Diagnosis present

## 2021-02-08 DIAGNOSIS — N1831 Chronic kidney disease, stage 3a: Secondary | ICD-10-CM | POA: Diagnosis present

## 2021-02-08 DIAGNOSIS — E11622 Type 2 diabetes mellitus with other skin ulcer: Secondary | ICD-10-CM | POA: Diagnosis present

## 2021-02-08 DIAGNOSIS — F32A Depression, unspecified: Secondary | ICD-10-CM | POA: Diagnosis present

## 2021-02-08 DIAGNOSIS — I872 Venous insufficiency (chronic) (peripheral): Secondary | ICD-10-CM | POA: Diagnosis present

## 2021-02-08 DIAGNOSIS — Z23 Encounter for immunization: Secondary | ICD-10-CM

## 2021-02-08 DIAGNOSIS — E1169 Type 2 diabetes mellitus with other specified complication: Secondary | ICD-10-CM | POA: Diagnosis present

## 2021-02-08 DIAGNOSIS — R5381 Other malaise: Secondary | ICD-10-CM | POA: Diagnosis present

## 2021-02-08 DIAGNOSIS — R7989 Other specified abnormal findings of blood chemistry: Secondary | ICD-10-CM

## 2021-02-08 DIAGNOSIS — E1165 Type 2 diabetes mellitus with hyperglycemia: Secondary | ICD-10-CM

## 2021-02-08 DIAGNOSIS — L8990 Pressure ulcer of unspecified site, unspecified stage: Secondary | ICD-10-CM | POA: Insufficient documentation

## 2021-02-08 DIAGNOSIS — I130 Hypertensive heart and chronic kidney disease with heart failure and stage 1 through stage 4 chronic kidney disease, or unspecified chronic kidney disease: Secondary | ICD-10-CM | POA: Diagnosis present

## 2021-02-08 DIAGNOSIS — K219 Gastro-esophageal reflux disease without esophagitis: Secondary | ICD-10-CM | POA: Diagnosis present

## 2021-02-08 DIAGNOSIS — Z888 Allergy status to other drugs, medicaments and biological substances status: Secondary | ICD-10-CM

## 2021-02-08 DIAGNOSIS — Z823 Family history of stroke: Secondary | ICD-10-CM

## 2021-02-08 DIAGNOSIS — E876 Hypokalemia: Secondary | ICD-10-CM | POA: Diagnosis present

## 2021-02-08 DIAGNOSIS — Z7901 Long term (current) use of anticoagulants: Secondary | ICD-10-CM

## 2021-02-08 DIAGNOSIS — L309 Dermatitis, unspecified: Secondary | ICD-10-CM | POA: Diagnosis present

## 2021-02-08 DIAGNOSIS — M25551 Pain in right hip: Secondary | ICD-10-CM | POA: Diagnosis present

## 2021-02-08 DIAGNOSIS — I5032 Chronic diastolic (congestive) heart failure: Secondary | ICD-10-CM | POA: Diagnosis present

## 2021-02-08 DIAGNOSIS — Z8616 Personal history of COVID-19: Secondary | ICD-10-CM

## 2021-02-08 DIAGNOSIS — Z79899 Other long term (current) drug therapy: Secondary | ICD-10-CM

## 2021-02-08 DIAGNOSIS — M1711 Unilateral primary osteoarthritis, right knee: Secondary | ICD-10-CM | POA: Diagnosis present

## 2021-02-08 DIAGNOSIS — L03115 Cellulitis of right lower limb: Secondary | ICD-10-CM | POA: Diagnosis not present

## 2021-02-08 DIAGNOSIS — E119 Type 2 diabetes mellitus without complications: Secondary | ICD-10-CM

## 2021-02-08 DIAGNOSIS — I482 Chronic atrial fibrillation, unspecified: Secondary | ICD-10-CM | POA: Diagnosis present

## 2021-02-08 DIAGNOSIS — Z66 Do not resuscitate: Secondary | ICD-10-CM | POA: Diagnosis present

## 2021-02-08 DIAGNOSIS — L89312 Pressure ulcer of right buttock, stage 2: Secondary | ICD-10-CM | POA: Diagnosis present

## 2021-02-08 DIAGNOSIS — Z20822 Contact with and (suspected) exposure to covid-19: Secondary | ICD-10-CM | POA: Diagnosis present

## 2021-02-08 DIAGNOSIS — E1140 Type 2 diabetes mellitus with diabetic neuropathy, unspecified: Secondary | ICD-10-CM | POA: Diagnosis present

## 2021-02-08 DIAGNOSIS — N183 Chronic kidney disease, stage 3 unspecified: Secondary | ICD-10-CM | POA: Diagnosis present

## 2021-02-08 DIAGNOSIS — N179 Acute kidney failure, unspecified: Secondary | ICD-10-CM | POA: Diagnosis present

## 2021-02-08 DIAGNOSIS — I82431 Acute embolism and thrombosis of right popliteal vein: Secondary | ICD-10-CM | POA: Diagnosis present

## 2021-02-08 DIAGNOSIS — E1122 Type 2 diabetes mellitus with diabetic chronic kidney disease: Secondary | ICD-10-CM | POA: Diagnosis present

## 2021-02-08 DIAGNOSIS — I4821 Permanent atrial fibrillation: Secondary | ICD-10-CM | POA: Diagnosis present

## 2021-02-08 LAB — CBC WITH DIFFERENTIAL/PLATELET
Abs Immature Granulocytes: 0.25 10*3/uL — ABNORMAL HIGH (ref 0.00–0.07)
Basophils Absolute: 0.1 10*3/uL (ref 0.0–0.1)
Basophils Relative: 1 %
Eosinophils Absolute: 0.1 10*3/uL (ref 0.0–0.5)
Eosinophils Relative: 1 %
HCT: 36.9 % (ref 36.0–46.0)
Hemoglobin: 10.4 g/dL — ABNORMAL LOW (ref 12.0–15.0)
Immature Granulocytes: 2 %
Lymphocytes Relative: 9 %
Lymphs Abs: 1.2 10*3/uL (ref 0.7–4.0)
MCH: 24.8 pg — ABNORMAL LOW (ref 26.0–34.0)
MCHC: 28.2 g/dL — ABNORMAL LOW (ref 30.0–36.0)
MCV: 88.1 fL (ref 80.0–100.0)
Monocytes Absolute: 0.7 10*3/uL (ref 0.1–1.0)
Monocytes Relative: 6 %
Neutro Abs: 10.5 10*3/uL — ABNORMAL HIGH (ref 1.7–7.7)
Neutrophils Relative %: 81 %
Platelets: 487 10*3/uL — ABNORMAL HIGH (ref 150–400)
RBC: 4.19 MIL/uL (ref 3.87–5.11)
RDW: 18.4 % — ABNORMAL HIGH (ref 11.5–15.5)
WBC: 12.8 10*3/uL — ABNORMAL HIGH (ref 4.0–10.5)
nRBC: 0.2 % (ref 0.0–0.2)

## 2021-02-08 LAB — COMPREHENSIVE METABOLIC PANEL WITH GFR
ALT: 16 U/L (ref 0–44)
AST: 18 U/L (ref 15–41)
Albumin: 2.8 g/dL — ABNORMAL LOW (ref 3.5–5.0)
Alkaline Phosphatase: 90 U/L (ref 38–126)
Anion gap: 11 (ref 5–15)
BUN: 12 mg/dL (ref 8–23)
CO2: 28 mmol/L (ref 22–32)
Calcium: 9.2 mg/dL (ref 8.9–10.3)
Chloride: 100 mmol/L (ref 98–111)
Creatinine, Ser: 1.07 mg/dL — ABNORMAL HIGH (ref 0.44–1.00)
GFR, Estimated: 58 mL/min — ABNORMAL LOW
Glucose, Bld: 179 mg/dL — ABNORMAL HIGH (ref 70–99)
Potassium: 2.9 mmol/L — ABNORMAL LOW (ref 3.5–5.1)
Sodium: 139 mmol/L (ref 135–145)
Total Bilirubin: 0.7 mg/dL (ref 0.3–1.2)
Total Protein: 6.7 g/dL (ref 6.5–8.1)

## 2021-02-08 LAB — RESP PANEL BY RT-PCR (FLU A&B, COVID) ARPGX2
Influenza A by PCR: NEGATIVE
Influenza B by PCR: NEGATIVE
SARS Coronavirus 2 by RT PCR: NEGATIVE

## 2021-02-08 LAB — D-DIMER, QUANTITATIVE: D-Dimer, Quant: 1.15 ug{FEU}/mL — ABNORMAL HIGH (ref 0.00–0.50)

## 2021-02-08 LAB — BRAIN NATRIURETIC PEPTIDE: B Natriuretic Peptide: 210.6 pg/mL — ABNORMAL HIGH (ref 0.0–100.0)

## 2021-02-08 MED ORDER — APIXABAN 5 MG PO TABS: 5.0000 mg | ORAL_TABLET | Freq: Every day | ORAL | 1 refills | Status: DC

## 2021-02-08 MED ORDER — POTASSIUM CHLORIDE CRYS ER 20 MEQ PO TBCR
40.0000 meq | EXTENDED_RELEASE_TABLET | Freq: Once | ORAL | Status: AC
  Administered 2021-02-08: 40 meq via ORAL
  Filled 2021-02-08: qty 2

## 2021-02-08 MED ORDER — ENOXAPARIN SODIUM 300 MG/3ML IJ SOLN
160.0000 mg | Freq: Two times a day (BID) | INTRAMUSCULAR | Status: DC
  Filled 2021-02-08 (×2): qty 1.6

## 2021-02-08 NOTE — Progress Notes (Addendum)
ANTICOAGULATION CONSULT NOTE - Initial Consult  Pharmacy Consult for Lovenox Indication:  R/o acute VTE and afib  Allergies  Allergen Reactions   Cymbalta [Duloxetine Hcl] Other (See Comments)    "loopiness"   Gabapentin Itching    Patient Measurements: Height: 6' (182.9 cm) Weight: (!) 158.9 kg (350 lb 5 oz) IBW/kg (Calculated) : 73.1 Heparin Dosing Weight: 111 kg  Vital Signs: Temp: 98.8 F (37.1 C) (09/02 1601) Temp Source: Oral (09/02 1601) BP: 141/79 (09/02 2309) Pulse Rate: 101 (09/02 2309)  Labs: Recent Labs    02/08/21 1632  HGB 10.4*  HCT 36.9  PLT 487*  CREATININE 1.07*    Estimated Creatinine Clearance: 90.1 mL/min (A) (by C-G formula based on SCr of 1.07 mg/dL (H)).   Medical History: Past Medical History:  Diagnosis Date   ALLERGIC RHINITIS 12/31/2007   ANXIETY 12/31/2007   Atrial fibrillation (HCC)    DEPRESSION 12/31/2007   DIABETES MELLITUS, TYPE II 12/31/2007   Diastolic dysfunction 10/11/2010   Hyperlipidemia 04/23/2012   HYPERTENSION 12/31/2007   Migraine    PNA (pneumonia)    in her 20s   Right knee DJD    Rosacea 10/11/2010    Medications:  See electronic med rec  Assessment: 65 y.o. F presents with R leg swelling. To begin Lovenox for r/o VTE. Noted pt on apixaban PTA for h/o afib - last dose 8/31 ~2000.  Goal of Therapy:  Anti-Xa level 0.6-1 units/ml 4hrs after LMWH dose given Monitor platelets by anticoagulation protocol: Yes   Plan:  Lovenox 160 mg SQ q12h CBC q72h while on Lovenox Consider anti-Xa level at Css in obese pt  Christoper Fabian, PharmD, BCPS Please see amion for complete clinical pharmacist phone list 02/08/2021,11:15 PM  Addendum: MD d/c Lovenox and restarted home apixaban before any Lovenox given. Pt was taking apixaban DAILY at home but that was because she was on Xarelto before and that was once daily so she was just taking it once daily. Plan to re-educate re: BID dosing of apixaban.  Christoper Fabian, PharmD,  BCPS Please see amion for complete clinical pharmacist phone list 02/09/2021 12:25 AM

## 2021-02-08 NOTE — ED Triage Notes (Signed)
The pt arrived by [facility] from home she was just discharged from [facility] yesterday after being there for 2 months  for pneumonia.  She reports that she was told that she wold have a nurse w/c and pther things before she was  discharged.  After she had none of those things she decided to come to the hospital  she has a red swollen leg and some sl sob no distress

## 2021-02-08 NOTE — Telephone Encounter (Signed)
Patient states that she has cellulitis in right leg towards ankle, SOB, and was discharged yesterday from nursing facility. Patient is currently at the ED because she is still not feeling well.    Per patient was switched in July by the hospital from Xarelto to Eliquis and needs a refill. Patient declined an appt with Dr. [name] due to not having transportation. Please advise.

## 2021-02-08 NOTE — Telephone Encounter (Signed)
Patient calling back to see why no one has given her a call back yet  Please see previous message below

## 2021-02-08 NOTE — Telephone Encounter (Signed)
Patient says she was discharged from skilled nursing facility & would like for provider or nurse to give her a call bc she has a few questions she would like to ask  Please call [phone]

## 2021-02-08 NOTE — ED Provider Notes (Signed)
Emergency Medicine Provider Triage Evaluation Note  Pamela [name] , a 65 y.o. female  was evaluated in triage.  Pt complains of shortness of breath and right leg tenderness. Pt was discharged from [facility] yesterday after being hospitalized for pneumonia. Since returning home she has not been getting the help she was promised and decided to be seen today. Was on xarelto for afib, was changed to eliquis while hospitalized but was not discharged with prescription for eliquis. Also complaining of right lower leg rash and tenderness, unsure how long it has been there.   Review of Systems  Positive: SOB, R lower leg rash Negative: Cough, congestion, abdominal pain, N/V, leg swelling  Physical Exam  BP 127/80 (BP Location: Right Arm)   Pulse (!) 105   Temp 98.8 F (37.1 C) (Oral)   Resp 16   Ht 6' (1.829 m)   Wt (!) 158.9 kg   LMP 09/08/2010 (Exact Date)   SpO2 94%   BMI 47.51 kg/m  Gen:   Awake, no distress   Resp:  Normal effort  MSK:   Moves extremities without difficulty  Other:  Diffuse erythema with crusting to R lower leg with tenderness to palpation  Medical Decision Making  Medically screening exam initiated at 4:26 PM.  Appropriate orders placed.  [name] was informed that the remainder of the evaluation will be completed by another provider, this initial triage assessment does not replace that evaluation, and the importance of remaining in the ED until their evaluation is complete.   Jeanella Flattery 02/08/21 1630    Pricilla Loveless, MD 02/10/21 [phone]

## 2021-02-08 NOTE — H&P (Signed)
History and Physical    Pamela [name] KGM:[id number] DOB: [birth year] DOA: 02/08/2021  PCP: Pamela [name]  Patient coming from: Home  I have personally briefly reviewed patient's old medical records in [facility]  Chief Complaint: Right leg swelling and redness  HPI: Pamela [name] is a 65 y.o. female with medical history significant for permanent atrial fibrillation on Eliquis, chronic diastolic CHF (EF 64-40%), CKD stage IIIa, T2DM, HTN, HLD, depression/anxiety who presented to the ED for evaluation of right leg swelling.  Patient was hospitalized 12/17/2020-12/21/2020 for lower GI bleeding related to diverticulosis and stercoral ulcer.  She was discharged to SNF.  Patient states she was sent home from her SNF on 02/07/2021 as her insurance would not pay for further days.  She says that she did not feel ready to leave due to continued generalized weakness and debility.  She did not have any equipment such as walker/cane arranged to her home.  She also did not have a prescription of her home Eliquis and has not taken it since evening of 8/31.  Yesterday she began to notice increased swelling and erythema to her right lower leg from above her ankle halfway up the shin.  She noticed a sore on the back of her leg that was having some clear drainage.  She otherwise denies any subjective fevers, chills, diaphoresis, chest pain.  She has had some increase shortness of breath compared to her baseline.  She denies any abdominal pain, dysuria, diarrhea.  ED Course:  Initial vitals showed BP 127/80, pulse 105, RR 16, temp 98.8 F, SPO2 94% on room air.  While in the ED patient was tachypneic with RR up to 33, remained tachycardic with pulse up to 114.  Labs show WBC 12.8, hemoglobin 10.4, platelets 487,000, sodium 139, potassium 2.9, bicarb 28, BUN 12, creatinine 1.07, serum glucose 179, BNP 210.6, D-dimer 1.15.  SARS-CoV-2 PCR negative, influenza is negative.  Portable chest x-ray shows  cardiomegaly with vascular congestion.  Right lower extremity venous ultrasound was ordered and pending.  Pharmacy was consulted for Lovenox for VTE treatment dosing.  Patient was given 40 mEq potassium.  The hospitalist service was consulted to admit for further evaluation and management.    Review of Systems: All systems reviewed and are negative except as documented in history of present illness above.   Past Medical History:  Diagnosis Date   ALLERGIC RHINITIS 12/31/2007   ANXIETY 12/31/2007   Atrial fibrillation (HCC)    DEPRESSION 12/31/2007   DIABETES MELLITUS, TYPE II 12/31/2007   Diastolic dysfunction 10/11/2010   Hyperlipidemia 04/23/2012   HYPERTENSION 12/31/2007   Migraine    PNA (pneumonia)    in her 20s   Right knee DJD    Rosacea 10/11/2010    Past Surgical History:  Procedure Laterality Date   2 D&C     1980s   COLONOSCOPY WITH PROPOFOL N/A 12/14/2020   Procedure: COLONOSCOPY WITH PROPOFOL;  Surgeon: Pamela [name];  Location: [facility] ENDOSCOPY;  Service: Endoscopy;  Laterality: N/A;    Social History:  reports that she has never smoked. She has never used smokeless tobacco. She reports that she does not currently use alcohol. She reports that she does not use drugs.  Allergies  Allergen Reactions   Cymbalta [Duloxetine Hcl] Other (See Comments)    "loopiness"   Gabapentin Itching    Family History  Problem Relation Age of Onset   Arthritis Other    Cancer Other  breast   Heart disease Other    Stroke Other    Mental illness Other        emotional   Diabetes Other      Prior to Admission medications   Medication Sig Start Date End Date Taking? Authorizing Provider  acetaminophen (TYLENOL) 325 MG tablet Take 2 tablets (650 mg total) by mouth every 6 (six) hours as needed for mild pain (or Fever >/= 101). Patient taking differently: Take 650 mg by mouth every 6 (six) hours as needed for mild pain or headache (or Fever >/= 101). 11/24/20  Yes Drema DallasWoods,  Curtis J, [name]  albuterol (VENTOLIN HFA) 108 (90 Base) MCG/ACT inhaler Inhale 2 puffs into the lungs every 6 (six) hours as needed for wheezing or shortness of breath. 06/21/20  Yes Pamela LevinsJohn, James W, [name]  apixaban (ELIQUIS) 5 MG TABS tablet Take 1 tablet (5 mg total) by mouth daily. 02/08/21  Yes Pamela LevinsJohn, James W, [name]  atorvastatin (LIPITOR) 40 MG tablet TAKE 1 TABLET(40 MG) BY MOUTH DAILY Patient taking differently: Take 40 mg by mouth daily. 06/21/20  Yes Pamela LevinsJohn, James W, [name]  cetirizine (ZYRTEC) 10 MG tablet 1 tab by mouth once daily as needed Patient taking differently: Take 10 mg by mouth daily as needed for allergies. 06/21/20  Yes Pamela LevinsJohn, James W, [name]  diltiazem (CARDIZEM CD) 360 MG 24 hr capsule TAKE 1 CAPSULE(360 MG) BY MOUTH DAILY Patient taking differently: Take 360 mg by mouth daily. 06/21/20  Yes Pamela LevinsJohn, James W, [name]  empagliflozin (JARDIANCE) 25 MG TABS tablet TAKE 1 TABLET BY MOUTH DAILY. Patient taking differently: Take 25 mg by mouth daily. 06/21/20  Yes Pamela LevinsJohn, James W, [name]  ferrous sulfate 325 (65 FE) MG tablet Take 1 tablet (325 mg total) by mouth daily. 12/26/20  Yes Charlynne PanderYao, David Hsienta, [name]  furosemide (LASIX) 40 MG tablet Take 1 tablet (40 mg total) by mouth daily. Needs appointment for further refills 09/06/20  Yes Laurey MoraleMcLean, Dalton S, [name]  glipiZIDE (GLUCOTROL XL) 10 MG 24 hr tablet 1 tab by mouth once daily Patient taking differently: Take 10 mg by mouth daily with breakfast. 06/21/20  Yes Pamela LevinsJohn, James W, [name]  losartan (COZAAR) 100 MG tablet Take 100 mg by mouth daily.   Yes Provider, Historical, [name]  meclizine (ANTIVERT) 12.5 MG tablet TAKE 1 TABLET(12.5 MG) BY MOUTH THREE TIMES DAILY AS NEEDED FOR DIZZINESS Patient taking differently: Take 12.5 mg by mouth 3 (three) times daily as needed for dizziness. 09/15/20  Yes Pamela LevinsJohn, James W, [name]  metoprolol succinate (TOPROL-XL) 100 MG 24 hr tablet Take 1 tablet (100 mg total) by mouth daily. Take with or immediately following a meal. 06/21/20  Yes Pamela LevinsJohn, James W, [name]  Omega-3  Fatty Acids (FISH OIL) 1000 MG CAPS Take 2 capsules (2,000 mg total) by mouth 2 (two) times daily. 08/19/17  Yes Laurey MoraleMcLean, Dalton S, [name]  pioglitazone (ACTOS) 30 MG tablet Take 1 tablet (30 mg total) by mouth daily. 06/24/20  Yes Pamela LevinsJohn, James W, [name]  polyethylene glycol (MIRALAX / GLYCOLAX) 17 g packet Take 17 g by mouth 2 (two) times daily. Patient taking differently: Take 17 g by mouth daily as needed for mild constipation. 12/21/20  Yes Burnadette PopAdhikari, Amrit, [name]  pregabalin (LYRICA) 50 MG capsule TAKE 1 CAPSULE(50 MG) BY MOUTH THREE TIMES DAILY Patient taking differently: Take 50 mg by mouth 3 (three) times daily. 06/21/20  Yes Pamela LevinsJohn, James W, [name]  senna-docusate (SENOKOT-S) 8.6-50 MG tablet Take 1 tablet by mouth 2 (two)  times daily as needed for moderate constipation. 11/24/20  Yes Drema Dallas, [name]  rivaroxaban (XARELTO) 20 MG TABS tablet Take 1 tablet (20 mg total) by mouth daily with supper. Patient not taking: Reported on 12/13/2020 11/24/20 12/13/20  Drema Dallas, [name]    Physical Exam: Vitals:   02/08/21 1859 02/08/21 2030 02/08/21 2045 02/08/21 2309  BP: 121/77 (!) 145/71 140/77 (!) 141/79  Pulse: (!) 105 (!) 107 (!) 104 (!) 101  Resp: 20 15 (!) 21 17  Temp:      TempSrc:      SpO2: 100% 100% 97% (!) 89%  Weight:      Height:       Constitutional: Morbidly obese woman resting in bed with head elevated, NAD, calm, comfortable Eyes: PERRL, lids and conjunctivae normal ENMT: Mucous membranes are moist. Posterior pharynx clear of any exudate or lesions.Normal dentition.  Neck: normal, supple, no masses. Respiratory: clear to auscultation bilaterally, no wheezing, no crackles. Normal respiratory effort. No accessory muscle use.  Cardiovascular: Irregularly irregular, no murmurs / rubs / gallops.  +2-3 bilateral lower extremity edema. 2+ pedal pulses. Abdomen: no tenderness, no masses palpated. No hepatosplenomegaly. Bowel sounds positive.  Musculoskeletal: no clubbing / cyanosis. No joint deformity  upper and lower extremities. Good ROM, no contractures. Normal muscle tone.  Skin: Erythema with increased warmth to touch at the right lower extremity from just above the ankle halfway up the shin.  Approximately 2 cm sore on the posterior right leg with serosanguineous drainage.  See pictures below. Neurologic: CN 2-12 grossly intact. Sensation intact. Strength 5/5 in all 4.  Psychiatric: Normal judgment and insight. Alert and oriented x 3. Normal mood.        Labs on Admission: I have personally reviewed following labs and imaging studies  CBC: Recent Labs  Lab 02/08/21 1632  WBC 12.8*  NEUTROABS 10.5*  HGB 10.4*  HCT 36.9  MCV 88.1  PLT 487*   Basic Metabolic Panel: Recent Labs  Lab 02/08/21 1632  NA 139  K 2.9*  CL 100  CO2 28  GLUCOSE 179*  BUN 12  CREATININE 1.07*  CALCIUM 9.2   GFR: Estimated Creatinine Clearance: 90.1 mL/min (A) (by C-G formula based on SCr of 1.07 mg/dL (H)). Liver Function Tests: Recent Labs  Lab 02/08/21 1632  AST 18  ALT 16  ALKPHOS 90  BILITOT 0.7  PROT 6.7  ALBUMIN 2.8*   No results for input(s): LIPASE, AMYLASE in the last 168 hours. No results for input(s): AMMONIA in the last 168 hours. Coagulation Profile: No results for input(s): INR, PROTIME in the last 168 hours. Cardiac Enzymes: No results for input(s): CKTOTAL, CKMB, CKMBINDEX, TROPONINI in the last 168 hours. BNP (last 3 results) Recent Labs    03/20/20 1145 06/21/20 1129  PROBNP 125.0* 139.0*   HbA1C: No results for input(s): HGBA1C in the last 72 hours. CBG: No results for input(s): GLUCAP in the last 168 hours. Lipid Profile: No results for input(s): CHOL, HDL, LDLCALC, TRIG, CHOLHDL, LDLDIRECT in the last 72 hours. Thyroid Function Tests: No results for input(s): TSH, T4TOTAL, FREET4, T3FREE, THYROIDAB in the last 72 hours. Anemia Panel: No results for input(s): VITAMINB12, FOLATE, FERRITIN, TIBC, IRON, RETICCTPCT in the last 72 hours. Urine  analysis:    Component Value Date/Time   COLORURINE YELLOW 06/21/2020 1129   APPEARANCEUR CLEAR 06/21/2020 1129   LABSPEC 1.010 06/21/2020 1129   PHURINE 5.5 06/21/2020 1129   GLUCOSEU >=1000 (A) 06/21/2020 1129  HGBUR NEGATIVE 06/21/2020 1129   BILIRUBINUR NEGATIVE 06/21/2020 1129   KETONESUR NEGATIVE 06/21/2020 1129   UROBILINOGEN 0.2 06/21/2020 1129   NITRITE NEGATIVE 06/21/2020 1129   LEUKOCYTESUR NEGATIVE 06/21/2020 1129    Radiological Exams on Admission: DG Chest Portable 1 View  Result Date: 02/08/2021 CLINICAL DATA:  Dyspnea EXAM: PORTABLE CHEST 1 VIEW COMPARISON:  12/26/2020, 12/17/2020 FINDINGS: Cardiomegaly with vascular congestion. No focal opacity, pleural effusion or pneumothorax. Atelectasis or scarring in mid lung. IMPRESSION: Cardiomegaly with vascular congestion Electronically Signed   By: Jasmine Pang M.D.   On: 02/08/2021 20:56    EKG: Personally reviewed. Atrial fibrillation, rate 107, low voltage.  Similar to prior.  Assessment/Plan Principal Problem:   Cellulitis of right lower extremity Active Problems:   Type 2 diabetes mellitus (HCC)   Hypertension associated with diabetes (HCC)   CKD (chronic kidney disease) stage 3, GFR 30-59 ml/min (HCC)   Chronic atrial fibrillation (HCC)   Chronic diastolic heart failure (HCC)   Hyperlipidemia associated with type 2 diabetes mellitus (HCC)   Andree AMAI CAPPIELLO is a 64 y.o. female with medical history significant for permanent atrial fibrillation on Eliquis, chronic diastolic CHF (EF 35-68%), CKD stage IIIa, T2DM, HTN, HLD, depression/anxiety who is admitted with cellulitis of right lower extremity.  Cellulitis of right lower extremity: Exam consistent with cellulitis of right lower extremity.  WBC elevated to 12.8.  There was concern for DVT in the ED patient was started on therapeutic Lovenox. -DC Lovenox, restart home Eliquis 5 mg twice daily -Follow RLE venous ultrasound -Start IV Ancef  Permanent atrial  fibrillation: Remains in atrial fibrillation with borderline tachycardia.  Continue Eliquis as above.  Resume home diltiazem and Toprol-XL.  Chronic diastolic CHF: Has chronic bilateral lower extremity edema, somewhat worse than baseline per patient.  Otherwise seems largely compensated. -Give IV Lasix 40 mg once now and restart home Lasix 40 mg in the morning -Monitor strict I/O's and daily weights  CKD stage IIIa: Stable, continue to monitor.  Hypokalemia: Supplement, check magnesium.  Type 2 diabetes: Hold home medications and place on SSI.  Hypertension: Resume home diltiazem, losartan, Toprol-XL, Lasix.  Hyperlipidemia: Continue atorvastatin.  Diabetic neuropathy: Continue Lyrica.  Deconditioning/debility: Request PT/OT eval.  DVT prophylaxis: Eliquis Code Status: DNR, confirmed with patient on admission Family Communication: Discussed with patient, she has discussed with family Disposition Plan: From home, dispo pending clinical progress Consults called: None Level of care: Telemetry Cardiac Admission status:  Status is: Observation  The patient remains OBS appropriate and will d/c before 2 midnights.  Dispo: The patient is from: Home              Anticipated d/c is to:  Home versus SNF              Patient currently is not medically stable to d/c.   Darreld Mclean [name] [facility]  If 7PM-7AM, please contact night-coverage [email or web address]  02/08/2021, 11:58 PM

## 2021-02-08 NOTE — ED Provider Notes (Signed)
[facility]   CSN: [id number] Arrival date & time: 02/08/21  1600     History Chief Complaint  Patient presents with   Shortness of Breath    Pamela GooLauren E Rando is a 65 y.o. female.   Shortness of Breath Associated symptoms: no fever    Patient presents to the ER with complaints of shortness of breath, right leg discomfort and inability to walk.  Patient states she has been in a nursing facility for the last 2 months.  Patient has not been able to ambulate she states since her hospitalization.  Patient states since she has been at the nursing facility she has not been walking.  She has not barely been standing other than a couple of times with a walker.  Patient states she was discharged from the nursing facility because of insurance reasons.  She states she has not been able to walk and she has been feeling more short of breath.  She also has been having episodes of lightheadedness and dizziness.  She also started noticing increasing leg redness and swelling in her lower leg.  For these reason she came to the ED today.  Past Medical History:  Diagnosis Date   ALLERGIC RHINITIS 12/31/2007   ANXIETY 12/31/2007   Atrial fibrillation (HCC)    DEPRESSION 12/31/2007   DIABETES MELLITUS, TYPE II 12/31/2007   Diastolic dysfunction 10/11/2010   Hyperlipidemia 04/23/2012   HYPERTENSION 12/31/2007   Migraine    PNA (pneumonia)    in her 20s   Right knee DJD    Rosacea 10/11/2010    Patient Active Problem List   Diagnosis Date Noted   Rectal bleeding    GI bleed 12/18/2020   Stercoral ulcer of rectum    Acute lower GI bleeding 12/13/2020   Chronic diastolic CHF (congestive heart failure) (HCC) 11/24/2020   Non-traumatic rhabdomyolysis 11/24/2020   Controlled type 2 diabetes mellitus with hyperglycemia (HCC) 11/24/2020   ARF (acute renal failure) (HCC) 11/18/2020   Elevated CK 11/18/2020   Acute pain of left knee 11/18/2020   AKI (acute  kidney injury) (HCC) 11/18/2020   Chronic diastolic heart failure (HCC) 06/24/2020   Debility 06/21/2020   Vertigo 03/20/2020   SOB (shortness of breath) 03/20/2020   Left shoulder pain 02/27/2019   Acute upper respiratory infection 09/07/2018   Low back pain 12/01/2017   Diabetic foot ulcer (HCC) 03/03/2017   Charcot foot due to diabetes mellitus (HCC) 02/28/2017   Diabetic ulcer of toe of left foot associated with type 2 diabetes mellitus, limited to breakdown of skin (HCC) 02/28/2017   Obesity, Class III, BMI 40-49.9 (morbid obesity) (HCC) 02/28/2017   Chronic atrial fibrillation (HCC) 12/08/2015   Navicular fracture of ankle with malunion 05/30/2015   Neuropathic pain of foot 05/30/2015   Navicular fracture, foot 05/02/2015   Right ankle pain 04/19/2015   Cough 11/17/2014   Fatigue 09/25/2014   Ganglion cyst 02/06/2014   Bilateral hand pain 11/29/2012   Normocytic anemia 04/23/2012   CKD (chronic kidney disease) stage 3, GFR 30-59 ml/min (HCC) 04/23/2012   Hyperlipidemia 04/23/2012   Peripheral neuropathy 01/16/2012   DJD (degenerative joint disease) of knee 04/15/2011   Migraine 04/15/2011   Acute on chronic diastolic heart failure (HCC) 10/28/2010   Rosacea 10/11/2010   Anticoagulated 10/11/2010   Encounter for preventative adult health care exam with abnormal findings 10/06/2010   Anxiety state 12/31/2007   Depression 12/31/2007   Essential hypertension 12/31/2007   ALLERGIC  RHINITIS 12/31/2007    Past Surgical History:  Procedure Laterality Date   2 D&C     1980s   COLONOSCOPY WITH PROPOFOL N/A 12/14/2020   Procedure: COLONOSCOPY WITH PROPOFOL;  Surgeon: Hilarie Fredrickson, MD;  Location: [facility] ENDOSCOPY;  Service: Endoscopy;  Laterality: N/A;     OB History   No obstetric history on file.     Family History  Problem Relation Age of Onset   Arthritis Other    Cancer Other        breast   Heart disease Other    Stroke Other    Mental illness Other         emotional   Diabetes Other     Social History   Tobacco Use   Smoking status: Never   Smokeless tobacco: Never  Substance Use Topics   Alcohol use: Not Currently    Comment: social   Drug use: No    Home Medications Prior to Admission medications   Medication Sig Start Date End Date Taking? Authorizing Provider  acetaminophen (TYLENOL) 325 MG tablet Take 2 tablets (650 mg total) by mouth every 6 (six) hours as needed for mild pain (or Fever >/= 101). 11/24/20   Drema Dallas, MD  albuterol (VENTOLIN HFA) 108 (90 Base) MCG/ACT inhaler Inhale 2 puffs into the lungs every 6 (six) hours as needed for wheezing or shortness of breath. 06/21/20   Corwin Levins, MD  ammonium lactate (LAC-HYDRIN) 12 % lotion Apply 1 application topically in the morning and at bedtime. Apply to feet,ankles and toes    [provider]  apixaban (ELIQUIS) 5 MG TABS tablet Take 1 tablet (5 mg total) by mouth daily. 02/08/21   Corwin Levins, MD  atorvastatin (LIPITOR) 40 MG tablet TAKE 1 TABLET(40 MG) BY MOUTH DAILY 06/21/20   Corwin Levins, MD  cetirizine (ZYRTEC) 10 MG tablet 1 tab by mouth once daily as needed 06/21/20   Corwin Levins, MD  diltiazem (CARDIZEM CD) 360 MG 24 hr capsule TAKE 1 CAPSULE(360 MG) BY MOUTH DAILY 06/21/20   Corwin Levins, MD  empagliflozin (JARDIANCE) 25 MG TABS tablet TAKE 1 TABLET BY MOUTH DAILY. 06/21/20   Corwin Levins, MD  ferrous sulfate 325 (65 FE) MG tablet Take 1 tablet (325 mg total) by mouth daily. 12/26/20   Charlynne Pander, MD  furosemide (LASIX) 40 MG tablet Take 1 tablet (40 mg total) by mouth daily. Needs appointment for further refills 09/06/20   Laurey Morale, MD  glipiZIDE (GLUCOTROL XL) 10 MG 24 hr tablet 1 tab by mouth once daily 06/21/20   Corwin Levins, MD  hydrocortisone (ANUSOL-HC) 2.5 % rectal cream Place 1 application rectally See admin instructions. Apply to anal atrea bid x 5 days for hemorrhoids    [provider]  meclizine (ANTIVERT) 12.5 MG  tablet TAKE 1 TABLET(12.5 MG) BY MOUTH THREE TIMES DAILY AS NEEDED FOR DIZZINESS 09/15/20   Corwin Levins, MD  metoprolol succinate (TOPROL-XL) 100 MG 24 hr tablet Take 1 tablet (100 mg total) by mouth daily. Take with or immediately following a meal. 06/21/20   Corwin Levins, MD  Omega-3 Fatty Acids (FISH OIL) 1000 MG CAPS Take 2 capsules (2,000 mg total) by mouth 2 (two) times daily. 08/19/17   Laurey Morale, MD  pioglitazone (ACTOS) 30 MG tablet Take 1 tablet (30 mg total) by mouth daily. 06/24/20   Corwin Levins, MD  polyethylene glycol [facility] /  GLYCOLAX) 17 g packet Take 17 g by mouth 2 (two) times daily. 12/21/20   Burnadette Pop, MD  pregabalin (LYRICA) 50 MG capsule TAKE 1 CAPSULE(50 MG) BY MOUTH THREE TIMES DAILY 06/21/20   Corwin Levins, MD  senna-docusate (SENOKOT-S) 8.6-50 MG tablet Take 1 tablet by mouth 2 (two) times daily as needed for moderate constipation. 11/24/20   Drema Dallas, MD  rivaroxaban (XARELTO) 20 MG TABS tablet Take 1 tablet (20 mg total) by mouth daily with supper. Patient not taking: Reported on 12/13/2020 11/24/20 12/13/20  Drema Dallas, MD    Allergies    Cymbalta [duloxetine hcl] and Gabapentin  Review of Systems   Review of Systems  Constitutional:  Negative for fever.  Respiratory:  Positive for shortness of breath.   All other systems reviewed and are negative.  Physical Exam Updated Vital Signs BP (!) 141/79   Pulse (!) 101   Temp 98.8 F (37.1 C) (Oral)   Resp 17   Ht 1.829 m (6')   Wt (!) 158.9 kg   LMP 09/08/2010 (Exact Date)   SpO2 (!) 89%   BMI 47.51 kg/m   Physical Exam Vitals and nursing note reviewed.  Constitutional:      Appearance: She is well-developed. She is obese.  HENT:     Head: Normocephalic and atraumatic.     Right Ear: External ear normal.     Left Ear: External ear normal.  Eyes:     General: No scleral icterus.       Right eye: No discharge.        Left eye: No discharge.     Conjunctiva/sclera: Conjunctivae  normal.  Neck:     Trachea: No tracheal deviation.  Cardiovascular:     Rate and Rhythm: Normal rate and regular rhythm.  Pulmonary:     Effort: Pulmonary effort is normal. No respiratory distress.     Breath sounds: Normal breath sounds. No stridor. No decreased breath sounds, wheezing or rales.  Abdominal:     General: Bowel sounds are normal. There is no distension.     Palpations: Abdomen is soft.     Tenderness: There is no abdominal tenderness. There is no guarding or rebound.  Musculoskeletal:        General: No tenderness or deformity.     Cervical back: Neck supple.     Right lower leg: Edema present.     Left lower leg: Edema present.     Comments: Edema bilateral lower extremities but increased on the right compared to the left, erythema and increased warmth noted on the right lower leg, some serous drainage noted  Skin:    General: Skin is warm and dry.     Findings: No rash.  Neurological:     General: No focal deficit present.     Mental Status: She is alert.     Cranial Nerves: No cranial nerve deficit (no facial droop, extraocular movements intact, no slurred speech).     Sensory: No sensory deficit.     Motor: No abnormal muscle tone or seizure activity.     Coordination: Coordination normal.  Psychiatric:        Mood and Affect: Mood normal.    ED Results / Procedures / Treatments   Labs (all labs ordered are listed, but only abnormal results are displayed) Labs Reviewed  CBC WITH DIFFERENTIAL/PLATELET - Abnormal; Notable for the following components:      Result Value   WBC 12.8 (*)  Hemoglobin 10.4 (*)    MCH 24.8 (*)    MCHC 28.2 (*)    RDW 18.4 (*)    Platelets 487 (*)    Neutro Abs 10.5 (*)    Abs Immature Granulocytes 0.25 (*)    All other components within normal limits  COMPREHENSIVE METABOLIC PANEL - Abnormal; Notable for the following components:   Potassium 2.9 (*)    Glucose, Bld 179 (*)    Creatinine, Ser 1.07 (*)    Albumin 2.8 (*)     GFR, Estimated 58 (*)    All other components within normal limits  BRAIN NATRIURETIC PEPTIDE - Abnormal; Notable for the following components:   B Natriuretic Peptide 210.6 (*)    All other components within normal limits  D-DIMER, QUANTITATIVE - Abnormal; Notable for the following components:   D-Dimer, Quant 1.15 (*)    All other components within normal limits  RESP PANEL BY RT-PCR (FLU A&B, COVID) [id number]    EKG EKG Interpretation  Date/Time:  Friday February 08 2021 21:16:46 EDT Ventricular Rate:  107 PR Interval:    QRS Duration: 104 QT Interval:  312 QTC Calculation: 417 R Axis:   114 Text Interpretation: Atrial fibrillation Right axis deviation Low voltage, precordial leads Abnormal R-wave progression, late transition Borderline repolarization abnormality No significant change since last tracing Confirmed by Linwood Dibbles [phone]) on 02/08/2021 9:27:48 PM  Radiology DG Chest Portable 1 View  Result Date: 02/08/2021 CLINICAL DATA:  Dyspnea EXAM: PORTABLE CHEST 1 VIEW COMPARISON:  12/26/2020, 12/17/2020 FINDINGS: Cardiomegaly with vascular congestion. No focal opacity, pleural effusion or pneumothorax. Atelectasis or scarring in mid lung. IMPRESSION: Cardiomegaly with vascular congestion Electronically Signed   By: Jasmine Pang M.D.   On: 02/08/2021 20:56    Procedures Procedures   Medications Ordered in ED Medications  potassium chloride SA (KLOR-CON) CR tablet 40 mEq (40 mEq Oral Given 02/08/21 2117)    ED Course  I have reviewed the triage vital signs and the nursing notes.  Pertinent labs & imaging results that were available during my care of the patient were reviewed by me and considered in my medical decision making (see chart for details).  Clinical Course as of 02/08/21 2317  Fri Feb 08, 2021  1941 Labs reviewed.  Potassium decreased at 2.9. [JK]  1942 White blood cell count elevated on CBC.  Hemoglobin improved from previous [JK]  2152 Dust x-ray shows  cardiomegaly with vascular congestion [JK]  2310 D-dimer elevated [JK]    Clinical Course User Index [JK] Linwood Dibbles, MD   MDM Rules/Calculators/A&P                           Patient presented to the ED for evaluation of lower extremity swelling as well as shortness of breath and inability to get around at home after recently being discharged from a nursing facility.  Patient was not in any respiratory distress.  She did not have any oxygen requirement.  Esculin congestion noted on chest x-ray but no pneumonia.  COVID and flu are pending.  BNP is not significantly elevated.  Patient does have chronic edema but I doubt acute heart failure exacerbation.  Patient's lower extremity exam is covid suggestive of possible cellulitis but I am concerned about the possibility of DVT.  She does have an elevated D-dimer and has a been taking her Eliquis for the last couple of days.  We will give her a dose of Lovenox.  She not good candidate for outpatient Doppler study.  I will consult the medical service for admission. Final Clinical Impression(s) / ED Diagnoses Final diagnoses:  Cellulitis of right lower extremity  Positive D dimer       Linwood Dibbles, MD 02/08/21 2317

## 2021-02-09 ENCOUNTER — Observation Stay (HOSPITAL_BASED_OUTPATIENT_CLINIC_OR_DEPARTMENT_OTHER): Payer: Managed Care, Other (non HMO)

## 2021-02-09 DIAGNOSIS — M7989 Other specified soft tissue disorders: Secondary | ICD-10-CM

## 2021-02-09 DIAGNOSIS — M79604 Pain in right leg: Secondary | ICD-10-CM | POA: Diagnosis not present

## 2021-02-09 DIAGNOSIS — L03115 Cellulitis of right lower limb: Secondary | ICD-10-CM | POA: Diagnosis not present

## 2021-02-09 LAB — CBC
HCT: 34 % — ABNORMAL LOW (ref 36.0–46.0)
Hemoglobin: 9.9 g/dL — ABNORMAL LOW (ref 12.0–15.0)
MCH: 25.4 pg — ABNORMAL LOW (ref 26.0–34.0)
MCHC: 29.1 g/dL — ABNORMAL LOW (ref 30.0–36.0)
MCV: 87.2 fL (ref 80.0–100.0)
Platelets: 401 10*3/uL — ABNORMAL HIGH (ref 150–400)
RBC: 3.9 MIL/uL (ref 3.87–5.11)
RDW: 18.3 % — ABNORMAL HIGH (ref 11.5–15.5)
WBC: 11.9 10*3/uL — ABNORMAL HIGH (ref 4.0–10.5)
nRBC: 0.2 % (ref 0.0–0.2)

## 2021-02-09 LAB — BASIC METABOLIC PANEL
Anion gap: 9 (ref 5–15)
BUN: 10 mg/dL (ref 8–23)
CO2: 29 mmol/L (ref 22–32)
Calcium: 8.9 mg/dL (ref 8.9–10.3)
Chloride: 101 mmol/L (ref 98–111)
Creatinine, Ser: 1 mg/dL (ref 0.44–1.00)
GFR, Estimated: >60 mL/min (ref >60)
Glucose, Bld: 194 mg/dL — ABNORMAL HIGH (ref 70–99)
Potassium: 2.7 mmol/L — CL (ref 3.5–5.1)
Sodium: 139 mmol/L (ref 135–145)

## 2021-02-09 LAB — GLUCOSE, CAPILLARY
Glucose-Capillary: 233 mg/dL — ABNORMAL HIGH (ref 70–99)
Glucose-Capillary: 165 mg/dL — ABNORMAL HIGH (ref 70–99)

## 2021-02-09 LAB — CBG MONITORING, ED
Glucose-Capillary: 181 mg/dL — ABNORMAL HIGH (ref 70–99)
Glucose-Capillary: 192 mg/dL — ABNORMAL HIGH (ref 70–99)

## 2021-02-09 LAB — MAGNESIUM: Magnesium: 2 mg/dL (ref 1.7–2.4)

## 2021-02-09 MED ORDER — ALBUTEROL SULFATE (2.5 MG/3ML) 0.083% IN NEBU: 2.5000 mg | INHALATION_SOLUTION | Freq: Four times a day (QID) | RESPIRATORY_TRACT | Status: DC | PRN

## 2021-02-09 MED ORDER — POTASSIUM CHLORIDE 20 MEQ PO PACK: 20.0000 meq | PACK | Freq: Every day | ORAL | Status: DC

## 2021-02-09 MED ORDER — SENNOSIDES-DOCUSATE SODIUM 8.6-50 MG PO TABS
1.0000 | ORAL_TABLET | Freq: Every evening | ORAL | Status: DC | PRN
  Filled 2021-02-09: qty 1

## 2021-02-09 MED ORDER — SODIUM CHLORIDE 0.9% FLUSH
3.0000 mL | Freq: Two times a day (BID) | INTRAVENOUS | Status: DC
Start: 2021-02-09 — End: 2021-03-16
  Administered 2021-02-09 – 2021-03-15 (×68): 3 mL via INTRAVENOUS

## 2021-02-09 MED ORDER — ATORVASTATIN CALCIUM 40 MG PO TABS
40.0000 mg | ORAL_TABLET | Freq: Every day | ORAL | Status: DC
  Administered 2021-02-09 – 2021-03-19 (×39): 40 mg via ORAL
  Filled 2021-02-09 (×40): qty 1

## 2021-02-09 MED ORDER — PREGABALIN 25 MG PO CAPS
50.0000 mg | ORAL_CAPSULE | Freq: Three times a day (TID) | ORAL | Status: DC
  Administered 2021-02-09 – 2021-03-19 (×115): 50 mg via ORAL
  Filled 2021-02-09 (×115): qty 2

## 2021-02-09 MED ORDER — INSULIN ASPART 100 UNIT/ML IJ SOLN
0.0000 [IU] | Freq: Three times a day (TID) | INTRAMUSCULAR | Status: DC
  Administered 2021-02-09 (×2): 2 [IU] via SUBCUTANEOUS
  Administered 2021-02-09: 3 [IU] via SUBCUTANEOUS
  Administered 2021-02-10 (×2): 2 [IU] via SUBCUTANEOUS
  Administered 2021-02-10 – 2021-02-11 (×2): 3 [IU] via SUBCUTANEOUS
  Administered 2021-02-11: 2 [IU] via SUBCUTANEOUS
  Administered 2021-02-11: 3 [IU] via SUBCUTANEOUS
  Administered 2021-02-12: 2 [IU] via SUBCUTANEOUS
  Administered 2021-02-12: 5 [IU] via SUBCUTANEOUS
  Administered 2021-02-12 – 2021-02-13 (×2): 3 [IU] via SUBCUTANEOUS
  Administered 2021-02-13: 2 [IU] via SUBCUTANEOUS
  Administered 2021-02-13 – 2021-02-14 (×2): 3 [IU] via SUBCUTANEOUS
  Administered 2021-02-14: 5 [IU] via SUBCUTANEOUS
  Administered 2021-02-14: 3 [IU] via SUBCUTANEOUS
  Administered 2021-02-15 (×3): 2 [IU] via SUBCUTANEOUS
  Administered 2021-02-16: 3 [IU] via SUBCUTANEOUS
  Administered 2021-02-16 – 2021-02-17 (×3): 2 [IU] via SUBCUTANEOUS
  Administered 2021-02-17: 3 [IU] via SUBCUTANEOUS
  Administered 2021-02-17: 2 [IU] via SUBCUTANEOUS
  Administered 2021-02-18 (×2): 3 [IU] via SUBCUTANEOUS
  Administered 2021-02-18: 2 [IU] via SUBCUTANEOUS
  Administered 2021-02-19: 3 [IU] via SUBCUTANEOUS
  Administered 2021-02-19: 2 [IU] via SUBCUTANEOUS
  Administered 2021-02-19 – 2021-02-20 (×2): 3 [IU] via SUBCUTANEOUS
  Administered 2021-02-20 (×2): 2 [IU] via SUBCUTANEOUS
  Administered 2021-02-21: 3 [IU] via SUBCUTANEOUS
  Administered 2021-02-21: 2 [IU] via SUBCUTANEOUS
  Administered 2021-02-21 – 2021-02-22 (×2): 3 [IU] via SUBCUTANEOUS
  Administered 2021-02-22 (×2): 2 [IU] via SUBCUTANEOUS
  Administered 2021-02-23 (×2): 3 [IU] via SUBCUTANEOUS
  Administered 2021-02-23: 5 [IU] via SUBCUTANEOUS
  Administered 2021-02-24 (×3): 2 [IU] via SUBCUTANEOUS
  Administered 2021-02-25: 1 [IU] via SUBCUTANEOUS
  Administered 2021-02-25 (×2): 2 [IU] via SUBCUTANEOUS
  Administered 2021-02-26: 1 [IU] via SUBCUTANEOUS
  Administered 2021-02-26 (×2): 2 [IU] via SUBCUTANEOUS
  Administered 2021-02-27: 1 [IU] via SUBCUTANEOUS
  Administered 2021-02-27: 2 [IU] via SUBCUTANEOUS
  Administered 2021-02-28: 1 [IU] via SUBCUTANEOUS
  Administered 2021-02-28: 2 [IU] via SUBCUTANEOUS
  Administered 2021-02-28 – 2021-03-04 (×11): 1 [IU] via SUBCUTANEOUS
  Administered 2021-03-05: 2 [IU] via SUBCUTANEOUS
  Administered 2021-03-05 – 2021-03-11 (×5): 1 [IU] via SUBCUTANEOUS
  Administered 2021-03-11: 2 [IU] via SUBCUTANEOUS
  Administered 2021-03-11: 1 [IU] via SUBCUTANEOUS

## 2021-02-09 MED ORDER — ONDANSETRON HCL 4 MG/2ML IJ SOLN: 4.0000 mg | Freq: Four times a day (QID) | INTRAMUSCULAR | Status: DC | PRN

## 2021-02-09 MED ORDER — POTASSIUM CHLORIDE 20 MEQ PO PACK
40.0000 meq | PACK | ORAL | Status: DC
  Administered 2021-02-09: 40 meq via ORAL
  Filled 2021-02-09: qty 2

## 2021-02-09 MED ORDER — FERROUS SULFATE 325 (65 FE) MG PO TABS
325.0000 mg | ORAL_TABLET | Freq: Every day | ORAL | Status: DC
  Administered 2021-02-09 – 2021-02-14 (×6): 325 mg via ORAL
  Filled 2021-02-09 (×6): qty 1

## 2021-02-09 MED ORDER — POTASSIUM CHLORIDE CRYS ER 10 MEQ PO TBCR
40.0000 meq | EXTENDED_RELEASE_TABLET | ORAL | Status: AC
  Administered 2021-02-09 (×2): 40 meq via ORAL
  Filled 2021-02-09 (×2): qty 4

## 2021-02-09 MED ORDER — APIXABAN 5 MG PO TABS: 5.0000 mg | ORAL_TABLET | Freq: Every day | ORAL | Status: DC

## 2021-02-09 MED ORDER — ACETAMINOPHEN 650 MG RE SUPP: 650.0000 mg | Freq: Four times a day (QID) | RECTAL | Status: DC | PRN

## 2021-02-09 MED ORDER — DILTIAZEM HCL ER COATED BEADS 180 MG PO CP24
360.0000 mg | ORAL_CAPSULE | Freq: Every day | ORAL | Status: DC
  Administered 2021-02-09 – 2021-03-02 (×22): 360 mg via ORAL
  Filled 2021-02-09 (×14): qty 2
  Filled 2021-02-09: qty 1
  Filled 2021-02-09 (×7): qty 2

## 2021-02-09 MED ORDER — APIXABAN 5 MG PO TABS
5.0000 mg | ORAL_TABLET | Freq: Two times a day (BID) | ORAL | Status: DC
  Administered 2021-02-09 – 2021-03-19 (×76): 5 mg via ORAL
  Filled 2021-02-09 (×37): qty 1
  Filled 2021-02-09: qty 2
  Filled 2021-02-09 (×41): qty 1

## 2021-02-09 MED ORDER — FUROSEMIDE 10 MG/ML IJ SOLN
40.0000 mg | Freq: Once | INTRAMUSCULAR | Status: AC
  Administered 2021-02-09: 40 mg via INTRAVENOUS
  Filled 2021-02-09: qty 4

## 2021-02-09 MED ORDER — CEFAZOLIN SODIUM-DEXTROSE 2-4 GM/100ML-% IV SOLN
2.0000 g | Freq: Three times a day (TID) | INTRAVENOUS | Status: DC
  Administered 2021-02-09 – 2021-02-13 (×13): 2 g via INTRAVENOUS
  Filled 2021-02-09 (×18): qty 100

## 2021-02-09 MED ORDER — ACETAMINOPHEN 325 MG PO TABS
650.0000 mg | ORAL_TABLET | Freq: Four times a day (QID) | ORAL | Status: DC | PRN
  Administered 2021-02-09 – 2021-03-05 (×4): 650 mg via ORAL
  Filled 2021-02-09 (×4): qty 2

## 2021-02-09 MED ORDER — LOSARTAN POTASSIUM 50 MG PO TABS
100.0000 mg | ORAL_TABLET | Freq: Every day | ORAL | Status: DC
  Administered 2021-02-09: 100 mg via ORAL
  Filled 2021-02-09: qty 2

## 2021-02-09 MED ORDER — ONDANSETRON HCL 4 MG PO TABS: 4.0000 mg | ORAL_TABLET | Freq: Four times a day (QID) | ORAL | Status: DC | PRN

## 2021-02-09 MED ORDER — FUROSEMIDE 40 MG PO TABS
40.0000 mg | ORAL_TABLET | Freq: Every day | ORAL | Status: DC
  Administered 2021-02-09: 40 mg via ORAL
  Filled 2021-02-09: qty 2

## 2021-02-09 MED ORDER — METOPROLOL SUCCINATE ER 100 MG PO TB24
100.0000 mg | ORAL_TABLET | Freq: Every day | ORAL | Status: DC
  Administered 2021-02-09 – 2021-03-19 (×40): 100 mg via ORAL
  Filled 2021-02-09 (×3): qty 1
  Filled 2021-02-09: qty 4
  Filled 2021-02-09 (×32): qty 1
  Filled 2021-02-09: qty 4
  Filled 2021-02-09 (×4): qty 1

## 2021-02-09 MED ORDER — GERHARDT'S BUTT CREAM
TOPICAL_CREAM | Freq: Two times a day (BID) | CUTANEOUS | Status: DC
  Administered 2021-03-01 – 2021-03-17 (×4): 1 via TOPICAL
  Filled 2021-02-09 (×7): qty 1

## 2021-02-09 NOTE — Progress Notes (Signed)
PROGRESS NOTE    WILLIEMAE [name]  WUJ:[id number] DOB: 1956-03-01 DOA: 02/08/2021 PCP: Corwin Levins, MD  Brief Narrative:Pamela [name] is a 65 y.o. female with medical history significant for permanent atrial fibrillation on Eliquis, chronic diastolic CHF (EF 95-62%), CKD stage IIIa, T2DM, HTN, HLD, depression/anxiety presented to the ED for evaluation of right leg swelling. -Recently hospitalized 12/17/2020-12/21/2020 for lower GI bleeding related to diverticulosis and stercoral ulcer.  She was discharged to SNF.  Patient states she was sent home from her SNF on 02/07/2021 as her insurance would not pay for further days continues to have knee and hip problems with generalized debility.  Reportedly also did not get a prescription for Eliquis and had not taken it for last 3 days at -On 9/1 she noticed increased swelling and erythema to her right lower leg from above her ankle halfway up the shin.  She noticed a sore on the back of her leg that was having some clear drainage   Assessment & Plan:   Cellulitis of right lower extremity: Age indeterminant right leg DVT -Exam is consistent with cellulitis, continue IV Ancef -Eliquis restarted -Keep leg elevated -Continue p.o. Lasix   Permanent atrial fibrillation: -Continue Eliquis as above.  -continue diltiazem and Toprol-XL.   Chronic diastolic CHF: -Has chronic bilateral lower extremity edema,  -Given Lasix IV x1 yesterday, continue p.o. Lasix today  General debility Chronic bilateral knee pain, right hip pain -PT OT eval   CKD stage IIIa: Stable, continue to monitor.   Hypokalemia -Replaced   Type 2 diabetes: Hold oral hypoglycemics, continue sliding scale insulin    Hypertension: -Continue home diltiazem,Toprol-XL, Lasix. -Hold losartan   Hyperlipidemia: Continue atorvastatin.   Diabetic neuropathy: Continue Lyrica.   DVT prophylaxis: Eliquis Code Status: DNR, confirmed with patient on admission Family Communication:  Discussed with patient, she has discussed with family Disposition Plan:  Status is: Observation  The patient will require care spanning > 2 midnights and should be moved to inpatient because: Hemodynamically unstable  Dispo: The patient is from: SNF              Anticipated d/c is to: SNF              Patient currently is not medically stable to d/c.   Difficult to place patient No       Procedures:   Antimicrobials:    Subjective: -Has some pain and discomfort in her right lower leg, breathing was cumbersome yesterday, now stable  Objective: Vitals:   02/08/21 2309 02/09/21 0412 02/09/21 0730 02/09/21 0819  BP: (!) 141/79 134/78 124/76 122/63  Pulse: (!) 101 (!) 107 98 (!) 104  Resp: 17 14 14    Temp:   97.8 F (36.6 C)   TempSrc:   Oral   SpO2: (!) 89% 93% 98%   Weight:      Height:        Intake/Output Summary (Last 24 hours) at 02/09/2021 1011 Last data filed at 02/09/2021 04/11/2021 Gross per 24 hour  Intake 100 ml  Output --  Net 100 ml   Filed Weights   02/08/21 1605  Weight: (!) 158.9 kg    Examination:  General exam: Obese pleasant female sitting up in bed, AAOx3, no distress CVS: S1-S2, regular rate rhythm Lungs: Decreased breath sounds to bases Abdomen: Soft, nontender, bowel sounds present Extremities: 1-2+ edema bilaterally, right lower leg with erythema warmth and tenderness, ulcer posteriorly with serosanguineous drainage  Skin: As above Psychiatry: Mood &  affect appropriate.     Data Reviewed:   CBC: Recent Labs  Lab 02/08/21 1632 02/09/21 0448  WBC 12.8* 11.9*  NEUTROABS 10.5*  --   HGB 10.4* 9.9*  HCT 36.9 34.0*  MCV 88.1 87.2  PLT 487* 401*   Basic Metabolic Panel: Recent Labs  Lab 02/08/21 1632 02/09/21 0448  NA 139 139  K 2.9* 2.7*  CL 100 101  CO2 28 29  GLUCOSE 179* 194*  BUN 12 10  CREATININE 1.07* 1.00  CALCIUM 9.2 8.9  MG  --  2.0   GFR: Estimated Creatinine Clearance: 96.4 mL/min (by C-G formula based on SCr  of 1 mg/dL). Liver Function Tests: Recent Labs  Lab 02/08/21 1632  AST 18  ALT 16  ALKPHOS 90  BILITOT 0.7  PROT 6.7  ALBUMIN 2.8*   No results for input(s): LIPASE, AMYLASE in the last 168 hours. No results for input(s): AMMONIA in the last 168 hours. Coagulation Profile: No results for input(s): INR, PROTIME in the last 168 hours. Cardiac Enzymes: No results for input(s): CKTOTAL, CKMB, CKMBINDEX, TROPONINI in the last 168 hours. BNP (last 3 results) Recent Labs    03/20/20 1145 06/21/20 1129  PROBNP 125.0* 139.0*   HbA1C: No results for input(s): HGBA1C in the last 72 hours. CBG: Recent Labs  Lab 02/09/21 0802  GLUCAP 181*   Lipid Profile: No results for input(s): CHOL, HDL, LDLCALC, TRIG, CHOLHDL, LDLDIRECT in the last 72 hours. Thyroid Function Tests: No results for input(s): TSH, T4TOTAL, FREET4, T3FREE, THYROIDAB in the last 72 hours. Anemia Panel: No results for input(s): VITAMINB12, FOLATE, FERRITIN, TIBC, IRON, RETICCTPCT in the last 72 hours. Urine analysis:    Component Value Date/Time   COLORURINE YELLOW 06/21/2020 1129   APPEARANCEUR CLEAR 06/21/2020 1129   LABSPEC 1.010 06/21/2020 1129   PHURINE 5.5 06/21/2020 1129   GLUCOSEU >=1000 (A) 06/21/2020 1129   HGBUR NEGATIVE 06/21/2020 1129   BILIRUBINUR NEGATIVE 06/21/2020 1129   KETONESUR NEGATIVE 06/21/2020 1129   UROBILINOGEN 0.2 06/21/2020 1129   NITRITE NEGATIVE 06/21/2020 1129   LEUKOCYTESUR NEGATIVE 06/21/2020 1129   Sepsis Labs: @LABRCNTIP (procalcitonin:4,lacticidven:4)  ) Recent Results (from the past 240 hour(s))  Resp Panel by RT-PCR (Flu A&B, Covid) Nasopharyngeal Swab     Status: None   Collection Time: 02/08/21  9:20 PM   Specimen: Nasopharyngeal Swab; Nasopharyngeal(NP) swabs in vial transport medium  Result Value Ref Range Status   SARS Coronavirus 2 by RT PCR NEGATIVE NEGATIVE Final    Comment: (NOTE) SARS-CoV-2 target nucleic acids are NOT DETECTED.  The SARS-CoV-2 RNA is  generally detectable in upper respiratory specimens during the acute phase of infection. The lowest concentration of SARS-CoV-2 viral copies this assay can detect is 138 copies/mL. A negative result does not preclude SARS-Cov-2 infection and should not be used as the sole basis for treatment or other patient management decisions. A negative result may occur with  improper specimen collection/handling, submission of specimen other than nasopharyngeal swab, presence of viral mutation(s) within the areas targeted by this assay, and inadequate number of viral copies(<138 copies/mL). A negative result must be combined with clinical observations, patient history, and epidemiological information. The expected result is Negative.  Fact Sheet for Patients:  04/10/21  Fact Sheet for Healthcare Providers:  BloggerCourse.com  This test is no t yet approved or cleared by the [place] FDA and  has been authorized for detection and/or diagnosis of SARS-CoV-2 by FDA under an Emergency Use Authorization (EUA). This EUA will  remain  in effect (meaning this test can be used) for the duration of the COVID-19 declaration under Section 564(b)(1) of the Act, 21 U.S.C.section 360bbb-3(b)(1), unless the authorization is terminated  or revoked sooner.       Influenza A by PCR NEGATIVE NEGATIVE Final   Influenza B by PCR NEGATIVE NEGATIVE Final    Comment: (NOTE) The Xpert Xpress SARS-CoV-2/FLU/RSV plus assay is intended as an aid in the diagnosis of influenza from Nasopharyngeal swab specimens and should not be used as a sole basis for treatment. Nasal washings and aspirates are unacceptable for Xpert Xpress SARS-CoV-2/FLU/RSV testing.  Fact Sheet for Patients: BloggerCourse.comhttps://www.fda.gov/media/152166/download  Fact Sheet for Healthcare Providers: SeriousBroker.ithttps://www.fda.gov/media/152162/download  This test is not yet approved or cleared by the [place] FDA and has been authorized for detection and/or diagnosis of SARS-CoV-2 by FDA under an Emergency Use Authorization (EUA). This EUA will remain in effect (meaning this test can be used) for the duration of the COVID-19 declaration under Section 564(b)(1) of the Act, 21 U.S.C. section 360bbb-3(b)(1), unless the authorization is terminated or revoked.  Performed at [facility], [address] [place], [place] [address]          Radiology Studies: DG Chest Portable 1 View  Result Date: 02/08/2021 CLINICAL DATA:  Dyspnea EXAM: PORTABLE CHEST 1 VIEW COMPARISON:  12/26/2020, 12/17/2020 FINDINGS: Cardiomegaly with vascular congestion. No focal opacity, pleural effusion or pneumothorax. Atelectasis or scarring in mid lung. IMPRESSION: Cardiomegaly with vascular congestion Electronically Signed   By: Jasmine PangKim  Fujinaga M.D.   On: 02/08/2021 20:56   VAS US LOWER EXTREMITY VENOUS (DVT) (ONLY [facility] & [facility])  Result Date: 02/09/2021  Lower Venous DVT Study Patient Name:  Bethann GooLAUREN E Gunther  Date of Exam:   02/09/2021 Medical Rec #: [id number]       Accession #:    [id number] Date of Birth: 07/24/1955       Patient Gender: F Patient Age:   [age] years Exam [facility] Procedure:      VAS US LOWER EXTREMITY VENOUS (DVT) Referring Phys: JON KNAPP --------------------------------------------------------------------------------  Indications: Pain, and Swelling.  Anticoagulation: Eliquis (missed several doses) Limitations: Body habitus and pain with compression. Comparison Study: No prior study on file Performing Technologist: Sherren Kernsandace Kanady RVS  Examination Guidelines: A complete evaluation includes B-mode imaging, spectral Doppler, color Doppler, and power Doppler as needed of all accessible portions of each vessel. Bilateral testing is considered an integral part of a complete examination. Limited examinations for reoccurring indications may be performed as noted. The reflux portion of the exam is  performed with the patient in reverse Trendelenburg.  +---------+---------------+---------+-----------+----------+-------------------+ RIGHT    CompressibilityPhasicitySpontaneityPropertiesThrombus Aging      +---------+---------------+---------+-----------+----------+-------------------+ CFV      Full           Yes      Yes                                      +---------+---------------+---------+-----------+----------+-------------------+ SFJ      Full                                                             +---------+---------------+---------+-----------+----------+-------------------+ FV Prox  Full                                                             +---------+---------------+---------+-----------+----------+-------------------+ FV Mid   Full                                                             +---------+---------------+---------+-----------+----------+-------------------+ FV Distal               Yes      Yes                  patent with color                                                         and Doppler         +---------+---------------+---------+-----------+----------+-------------------+ PFV      Full                                                             +---------+---------------+---------+-----------+----------+-------------------+ POP      Partial        No       No                   Age Indeterminate   +---------+---------------+---------+-----------+----------+-------------------+ PERO                                                  Not well visualized +---------+---------------+---------+-----------+----------+-------------------+ Soleal                                                Not well visualized +---------+---------------+---------+-----------+----------+-------------------+ Gastroc  Full                                                              +---------+---------------+---------+-----------+----------+-------------------+   +----+---------------+---------+-----------+----------+--------------+ LEFTCompressibilityPhasicitySpontaneityPropertiesThrombus Aging +----+---------------+---------+-----------+----------+--------------+ CFV Full           Yes      Yes                                 +----+---------------+---------+-----------+----------+--------------+    Summary: RIGHT: - Findings consistent with age indeterminate deep vein thrombosis involving the right popliteal vein. -  A cystic structure is found in the popliteal fossa.  LEFT: - No evidence of common femoral vein obstruction.  *See table(s) above for measurements and observations.    Preliminary         Scheduled Meds:  apixaban  5 mg Oral BID   atorvastatin  40 mg Oral Daily   diltiazem  360 mg Oral Daily   ferrous sulfate  325 mg Oral Daily   furosemide  40 mg Oral Daily   insulin aspart  0-9 Units Subcutaneous TID WC   losartan  100 mg Oral Daily   metoprolol succinate  100 mg Oral Daily   potassium chloride  40 mEq Oral Q2H   pregabalin  50 mg Oral TID   sodium chloride flush  3 mL Intravenous Q12H   Continuous Infusions:   ceFAZolin (ANCEF) IV Stopped (02/09/21 0916)     LOS: 0 days    Time spent:    Zannie Cove, MD [facility]   02/09/2021, 10:11 AM

## 2021-02-09 NOTE — Progress Notes (Signed)
VASCULAR LAB    Right lower extremity venous duplex has been performed.  See CV proc for preliminary results.  Called Dr. Jomarie Longs with results.  Lucinda Spells, RVT 02/09/2021, 9:57 AM

## 2021-02-09 NOTE — ED Notes (Signed)
No needs from pt at this time . Pt made aware of inpatient room being cleaned for her.

## 2021-02-09 NOTE — ED Notes (Signed)
Pt transported to [place] for DVT study via stretcher.

## 2021-02-09 NOTE — ED Notes (Signed)
Potassium 2.7, Admitting Dr. Jomarie Longs made aware

## 2021-02-09 NOTE — ED Notes (Signed)
Breakfast Ordered 

## 2021-02-09 NOTE — Evaluation (Signed)
Physical Therapy Evaluation Patient Details Name: Pamela [name] MRN: [id number] DOB: 05-18-1956 Today's Date: 02/09/2021   History of Present Illness  Pt is a 65 y.o. female who presented 02/08/21 with R leg swelling and erythema. Per chart: "Patient was hospitalized 12/17/2020-12/21/2020 for lower GI bleeding related to diverticulosis and stercoral ulcer" and then discharged to SNF then home. Chest x-ray shows cardiomegaly with vascular congestion. Pt admitted with R lower extremiity cellulitis and age indeterminant R leg DVT. PMH: anxiety, permanent a-fib, chronic diastolic CHF, depression, DM2, diastolic dysfunction, HTN, migraines, R knee DJD, rosacea.   Clinical Impression  Pt presents with condition above and deficits mentioned below, see PT Problem List. Pt is familiar to this PT. Prior to June 2022, pt was living alone and functioning independently without AD/AE. However, since then she has been in and out of the hospital and SNF and had just began to work on standing with a RW when she was discharged home 02/07/2021 as her insurance would not pay for further days. Pt was bedbound at home since return home until this admission as she had not received any medical equipment or had HHPT/OT begin yet. Currently, pt displays bil lower extremity weakness (R weaker than L likely due to pain), bil lower extremity edema, R leg erythema, balance deficits, and activity tolerance deficits. Pt requires up to minA to transition supine <> sit EOB and supervision for rolling and static sitting at EOB. Pt requested to not stand today due to leg pain. Pt would benefit from further short-term rehab at a SNF to maximize her independence and safety with all functional mobility as she is very motivated to return to being independent again and she is at high risk for falls and injuries currently. Will continue to follow acutely.    Follow Up Recommendations SNF;Supervision for mobility/OOB    Equipment Recommendations   Rolling walker with 5" wheels;3in1 (PT);Wheelchair (measurements PT);Wheelchair cushion (measurements PT);Hospital bed;Other (comment) (drop arm for 3in1 and w/c; sliding board; bariatric for all equipment)    Recommendations for Other Services       Precautions / Restrictions Precautions Precautions: Fall Precaution Comments: DVT R leg Restrictions Weight Bearing Restrictions: No      Mobility  Bed Mobility Overal bed mobility: Needs Assistance Bed Mobility: Rolling;Sit to Supine;Supine to Sit Rolling: Supervision   Supine to sit: Min assist;HOB elevated Sit to supine: Min assist;HOB elevated   General bed mobility comments: Pt able to roll either direction with use of rail with supervision. MinA to manage R leg with supine <> sit transitions.    Transfers Overall transfer level: Needs assistance   Transfers: Lateral/Scoot Transfers          Lateral/Scoot Transfers: Mod assist General transfer comment: Pt initiates lateral scoot to L, x6 reps, on EOB, needing modA with use of bed pad to assist in slide. Pt requested not to stand today.  Ambulation/Gait             General Gait Details: Pt requested not to try to stand today.  Stairs            Wheelchair Mobility    Modified Rankin (Stroke Patients Only)       Balance Overall balance assessment: Needs assistance Sitting-balance support: No upper extremity supported;Feet supported Sitting balance-[name]: Good Sitting balance - Comments: Supervision sitting EOB statically.       Standing balance comment: Pt requested not to stand today.  Pertinent Vitals/Pain Pain Assessment: Faces Faces Pain Scale: Hurts little more Pain Location: R lower leg Pain Descriptors / Indicators: Discomfort;Grimacing;Guarding Pain Intervention(s): Limited activity within patient's tolerance;Monitored during session;Repositioned    Home Living Family/patient expects to  be discharged to:: Private residence Living Arrangements: Alone Available Help at Discharge: Family;Available PRN/intermittently Type of Home: Apartment Home Access: Level entry     Home Layout: One level Home Equipment: Shower seat      Prior Function Level of Independence: Needs assistance   Gait / Transfers Assistance Needed: In June 2022, pt was independent with all mobility and ADLs and driving. Since then, she has had multiple hospital admissions has gone to SNF for rehab where pt had begun to stand with a RW but had not walked yet when insurance for SNF ended and pt was returned home. Since going home from SNF a few days ago, she has been bedbound as she did not receive any medical equipment or had HHPT/OT start yet.           Hand Dominance   Dominant Hand: Right    Extremity/Trunk Assessment   Upper Extremity Assessment Upper Extremity Assessment: Defer to OT evaluation    Lower Extremity Assessment Lower Extremity Assessment: Generalized weakness;RLE deficits/detail RLE Deficits / Details: Edema noted and erythema in lower leg and lateral aspect of foot; increased difficulty raising leg against gravity compared to L    Cervical / Trunk Assessment Cervical / Trunk Assessment: Normal  Communication   Communication: No difficulties  Cognition Arousal/Alertness: Awake/alert Behavior During Therapy: WFL for tasks assessed/performed Overall Cognitive Status: Within Functional Limits for tasks assessed                                 General Comments: Pleasant and able to follow all commands appropriately. Recalls personal hx well.      General Comments General comments (skin integrity, edema, etc.): Educated pt to elevate legs and perform ankle pumps    Exercises     Assessment/Plan    PT Assessment Patient needs continued PT services  PT Problem List Decreased strength;Decreased range of motion;Decreased activity tolerance;Decreased  balance;Decreased mobility;Decreased coordination;Decreased knowledge of use of DME;Obesity;Decreased skin integrity;Pain       PT Treatment Interventions DME instruction;Gait training;Functional mobility training;Therapeutic activities;Therapeutic exercise;Balance training;Neuromuscular re-education;Patient/family education;Wheelchair mobility training    PT Goals (Current goals can be found in the Care Plan section)  Acute Rehab PT Goals Patient Stated Goal: to get back to being independent PT Goal Formulation: With patient Time For Goal Achievement: 02/23/21 Potential to Achieve Goals: Fair    Frequency Min 2X/week   Barriers to discharge Decreased caregiver support;Other (comment) (lack of medical equipment)      Co-evaluation               AM-PAC PT "6 Clicks" Mobility  Outcome Measure Help needed turning from your back to your side while in a flat bed without using bedrails?: A Little Help needed moving from lying on your back to sitting on the side of a flat bed without using bedrails?: A Little Help needed moving to and from a bed to a chair (including a wheelchair)?: Total Help needed standing up from a chair using your arms (e.g., wheelchair or bedside chair)?: Total Help needed to walk in hospital room?: Total Help needed climbing 3-5 steps with a railing? : Total 6 Click Score: 10    End of Session  Activity Tolerance: Patient limited by pain Patient left: in bed;with call bell/phone within reach;with bed alarm set Nurse Communication: Mobility status PT Visit Diagnosis: Unsteadiness on feet (R26.81);Muscle weakness (generalized) (M62.81);Difficulty in walking, not elsewhere classified (R26.2);Pain Pain - Right/Left: Right Pain - part of body: Leg    Time: 1420-1501 PT Time Calculation (min) (ACUTE ONLY): 41 min   Charges:   PT Evaluation $PT Eval Moderate Complexity: 1 Mod PT Treatments $Therapeutic Activity: 23-37 mins        Raymond Gurney,  PT, DPT Acute Rehabilitation Services  Pager: [phone] Office: [phone]   Jewel Baize 02/09/2021, 3:12 PM

## 2021-02-09 NOTE — ED Notes (Signed)
Pt transported back to room from [place] via stretcher.

## 2021-02-09 NOTE — ED Notes (Signed)
Pt received breakfast tray 

## 2021-02-10 DIAGNOSIS — L03115 Cellulitis of right lower limb: Secondary | ICD-10-CM | POA: Diagnosis not present

## 2021-02-10 LAB — BASIC METABOLIC PANEL
Anion gap: 7 (ref 5–15)
BUN: 12 mg/dL (ref 8–23)
CO2: 28 mmol/L (ref 22–32)
Calcium: 8.5 mg/dL — ABNORMAL LOW (ref 8.9–10.3)
Chloride: 100 mmol/L (ref 98–111)
Creatinine, Ser: 1.34 mg/dL — ABNORMAL HIGH (ref 0.44–1.00)
GFR, Estimated: 44 mL/min — ABNORMAL LOW (ref >60)
Glucose, Bld: 197 mg/dL — ABNORMAL HIGH (ref 70–99)
Potassium: 3.3 mmol/L — ABNORMAL LOW (ref 3.5–5.1)
Sodium: 135 mmol/L (ref 135–145)

## 2021-02-10 LAB — GLUCOSE, CAPILLARY
Glucose-Capillary: 165 mg/dL — ABNORMAL HIGH (ref 70–99)
Glucose-Capillary: 198 mg/dL — ABNORMAL HIGH (ref 70–99)
Glucose-Capillary: 215 mg/dL — ABNORMAL HIGH (ref 70–99)
Glucose-Capillary: 206 mg/dL — ABNORMAL HIGH (ref 70–99)

## 2021-02-10 LAB — CBC
HCT: 30.4 % — ABNORMAL LOW (ref 36.0–46.0)
Hemoglobin: 8.9 g/dL — ABNORMAL LOW (ref 12.0–15.0)
MCH: 25.1 pg — ABNORMAL LOW (ref 26.0–34.0)
MCHC: 29.3 g/dL — ABNORMAL LOW (ref 30.0–36.0)
MCV: 85.9 fL (ref 80.0–100.0)
Platelets: 387 10*3/uL (ref 150–400)
RBC: 3.54 MIL/uL — ABNORMAL LOW (ref 3.87–5.11)
RDW: 18.3 % — ABNORMAL HIGH (ref 11.5–15.5)
WBC: 12.9 10*3/uL — ABNORMAL HIGH (ref 4.0–10.5)
nRBC: 0.2 % (ref 0.0–0.2)

## 2021-02-10 MED ORDER — FUROSEMIDE 40 MG PO TABS
40.0000 mg | ORAL_TABLET | Freq: Two times a day (BID) | ORAL | Status: DC
  Administered 2021-02-10 – 2021-03-19 (×75): 40 mg via ORAL
  Filled 2021-02-10 (×76): qty 1

## 2021-02-10 MED ORDER — POTASSIUM CHLORIDE CRYS ER 20 MEQ PO TBCR
40.0000 meq | EXTENDED_RELEASE_TABLET | Freq: Three times a day (TID) | ORAL | Status: AC
  Administered 2021-02-10 (×3): 40 meq via ORAL
  Filled 2021-02-10 (×3): qty 2

## 2021-02-10 NOTE — Evaluation (Signed)
Occupational Therapy Evaluation Patient Details Name: Pamela [name] MRN: [id number] DOB: 1956-05-12 Today's Date: 02/10/2021    History of Present Illness Pt is a 65 y.o. female who presented 02/08/21 with R leg swelling and erythema. Per chart: "Patient was hospitalized 12/17/2020-12/21/2020 for lower GI bleeding related to diverticulosis and stercoral ulcer" and then discharged to SNF then home. Chest x-ray shows cardiomegaly with vascular congestion. Pt admitted with R lower extremiity cellulitis and age indeterminant R leg DVT. PMH: anxiety, permanent a-fib, chronic diastolic CHF, depression, DM2, diastolic dysfunction, HTN, migraines, R knee DJD, rosacea.   Clinical Impression   Pt admitted with above. She demonstrates the below listed deficits and will benefit from continued OT to maximize safety and independence with BADLs.  Pt presents to OT with generalized weakness, decreased activity tolerance.  She currently requires min A for UB ADLs and max - total A for LB ADLs.  She was unable to move sit to stand, but performed several partial sit to stands, and scooted up toward the [facility] with min guard assist. PTA, she lived alone.  Prior to June admission, pt was mod I with all ADLs, but has required assist since that time.  Recommend SNF level rehab at discharge.      Follow Up Recommendations  SNF    Equipment Recommendations  None recommended by OT    Recommendations for Other Services       Precautions / Restrictions Precautions Precautions: Fall Precaution Comments: DVT R leg      Mobility Bed Mobility Overal bed mobility: Needs Assistance Bed Mobility: Supine to Sit;Sit to Supine Rolling: Supervision   Supine to sit: Supervision;HOB elevated Sit to supine: Min assist   General bed mobility comments: heavy use of bedrails.  Assist to lift LEs back onto bed    Transfers Overall transfer level: Needs assistance                    Balance Overall balance assessment:  Needs assistance Sitting-balance support: No upper extremity supported;Feet supported Sitting balance-[name]: Good Sitting balance - Comments: Supervision sitting EOB statically.                                   ADL either performed or assessed with clinical judgement   ADL Overall ADL's : Needs assistance/impaired Eating/Feeding: Independent   Grooming: Wash/dry hands;Wash/dry face;Oral care;Brushing hair;Set up;Sitting   Upper Body Bathing: Minimal assistance;Sitting   Lower Body Bathing: Maximal assistance;Bed level;Sitting/lateral leans   Upper Body Dressing : Minimal assistance;Sitting   Lower Body Dressing: Total assistance;Sit to/from stand   Toilet Transfer: Total assistance Toilet Transfer Details (indicate cue type and reason): pt unable Toileting- Clothing Manipulation and Hygiene: Total assistance;Bed level       Functional mobility during ADLs: Total assistance General ADL Comments: Pt fatigues quickly with activity     Vision Patient Visual Report: No change from baseline       Perception     Praxis      Pertinent Vitals/Pain Pain Assessment: No/denies pain     Hand Dominance Right   Extremity/Trunk Assessment Upper Extremity Assessment Upper Extremity Assessment: Generalized weakness   Lower Extremity Assessment Lower Extremity Assessment: Defer to PT evaluation   Cervical / Trunk Assessment Cervical / Trunk Assessment: Normal   Communication Communication Communication: No difficulties   Cognition Arousal/Alertness: Awake/alert Behavior During Therapy: Flat affect;WFL for tasks assessed/performed Overall Cognitive Status: Within  Functional Limits for tasks assessed                                     General Comments       Exercises Exercises: Other exercises Other Exercises Other Exercises: Pt attempted sit to stand x 3 using recliner with the back and handles facing her, and using handles to  pull up on .  She was able to boost buttocks off bed ~4" each time Other Exercises: Pt scooted down EOB toward the [facility] with min guard assist and bed placed in reverse trendelenburg to bias her in the correct direction of the [facility]   Shoulder Instructions      Home Living Family/patient expects to be discharged to:: Private residence Living Arrangements: Alone Available Help at Discharge: Family;Available PRN/intermittently Type of Home: Apartment Home Access: Level entry     Home Layout: One level     Bathroom Shower/Tub: Chief Strategy Officer: Standard     Home Equipment: Shower seat          Prior Functioning/Environment Level of Independence: Needs assistance  Gait / Transfers Assistance Needed: In June 2022, pt was independent with all mobility and ADLs and driving. Since then, she has had multiple hospital admissions has gone to SNF for rehab where pt had begun to stand with a RW but had not walked yet when insurance for SNF ended and pt was returned home. Since going home from SNF a few days ago, she has been bedbound as she did not receive any medical equipment or had HHPT/OT start yet. ADL's / Homemaking Assistance Needed: Pt reports she was having assistance with all ADL. Pt was not getting to [facility] at SNF, she was wearing diapers   Comments: prior to previous SNF admission pt was independent with ADL/IADL and self-care.  Pt reports she was working full time from home prior to admission in June        OT Problem List: Decreased strength;Decreased activity tolerance;Impaired balance (sitting and/or standing);Decreased knowledge of use of DME or AE;Obesity      OT Treatment/Interventions: Self-care/ADL training;DME and/or AE instruction;Therapeutic activities;Patient/family education;Balance training;Therapeutic exercise    OT Goals(Current goals can be found in the care plan section) Acute Rehab OT Goals Patient Stated Goal: to be able to take care of  himself OT Goal Formulation: With patient Time For Goal Achievement: 02/24/21 Potential to Achieve Goals: Good ADL Goals Pt Will Perform Upper Body Bathing: with set-up;with supervision;sitting Pt Will Perform Lower Body Bathing: with mod assist;sitting/lateral leans;bed level Pt Will Perform Upper Body Dressing: with set-up;sitting Pt Will Perform Lower Body Dressing: with mod assist;with adaptive equipment;bed level;sitting/lateral leans Pt Will Transfer to Toilet: with mod assist;with +2 assist;with transfer board;bedside commode Pt/caregiver will Perform Home Exercise Program: Increased strength;Right Upper extremity;Left upper extremity;With theraband;Independently;With written HEP provided  OT Frequency: Min 2X/week   Barriers to D/C: Decreased caregiver support          Co-evaluation              AM-PAC OT "6 Clicks" Daily Activity     Outcome Measure Help from another person eating meals?: A Little Help from another person taking care of personal grooming?: A Little Help from another person toileting, which includes using toliet, bedpan, or urinal?: A Lot Help from another person bathing (including washing, rinsing, drying)?: A Lot Help from another person to put on  and taking off regular upper body clothing?: A Little Help from another person to put on and taking off regular lower body clothing?: Total 6 Click Score: 14   End of Session Equipment Utilized During Treatment: Oxygen Nurse Communication: Mobility status  Activity Tolerance: Patient tolerated treatment well Patient left: in bed;with bed alarm set;with call bell/phone within reach  OT Visit Diagnosis: Unsteadiness on feet (R26.81);Muscle weakness (generalized) (M62.81)                Time: 2094-7096 OT Time Calculation (min): 29 min Charges:  OT General Charges $OT Visit: 1 Visit OT Evaluation $OT Eval Moderate Complexity: 1 Mod OT Treatments $Therapeutic Activity: 8-22 mins  Eber Jones.,  OTR/L Acute Rehabilitation Services Pager [phone] Office [phone]   Jeani Hawking M 02/10/2021, 12:24 PM

## 2021-02-10 NOTE — Progress Notes (Signed)
PROGRESS NOTE    [name]  ZSW:[id number] DOB: [birth year] DOA: 02/08/2021 PCP: Corwin Levins, MD  Brief Narrative:Pamela [name] is a 65 y.o. female with medical history significant for permanent atrial fibrillation on Eliquis, chronic diastolic CHF (EF 32-20%), CKD stage IIIa, T2DM, HTN, HLD, depression/anxiety presented to the ED for evaluation of right leg swelling. -Recently hospitalized 12/17/2020-12/21/2020 for lower GI bleeding related to diverticulosis and stercoral ulcer.  She was discharged to SNF.  Patient states she was sent home from her SNF on 02/07/2021 as her insurance would not pay for further days continues to have knee and hip problems with generalized debility.  Reportedly also did not get a prescription for Eliquis and had not taken it for last 3 days at -On 9/1 she noticed increased swelling and erythema to her right lower leg from above her ankle halfway up the shin.  She noticed a sore on the back of her leg that was having some clear drainage   Assessment & Plan:   Cellulitis of right lower extremity: Age indeterminant right leg DVT -Exam is consistent with cellulitis, continue IV Ancef -Eliquis resumed -Keep leg elevated -Will increase Lasix dose   Permanent atrial fibrillation: -Continue Eliquis as above.  -continue diltiazem and Toprol-XL.   Chronic diastolic CHF: -Has chronic bilateral lower extremity edema,  -Given Lasix IV x1 on admission, continue p.o., increased dose, BMP in a.m.  General debility Chronic bilateral knee pain, right hip pain -PT OT eval completed, SNF recommended -TOC consulted   CKD stage IIIa: Stable, continue to monitor.   Hypokalemia -Replaced   Type 2 diabetes: Hold oral hypoglycemics, continue sliding scale insulin    Hypertension: -Continue home diltiazem,Toprol-XL, Lasix. -Hold losartan   Hyperlipidemia: Continue atorvastatin.   Diabetic neuropathy: Continue Lyrica.   DVT prophylaxis: Eliquis Code Status:  DNR Family Communication: No family at bedside, discussed with patient in detail Disposition Plan:  Status is: Observation  The patient will require care spanning > 2 midnights and should be moved to inpatient because: Hemodynamically unstable  Dispo: The patient is from: SNF              Anticipated d/c is to: SNF              Patient currently is not medically stable to d/c.   Difficult to place patient No   Procedures:   Antimicrobials:    Subjective: -Has some pain and discomfort in her right lower leg, breathing was cumbersome yesterday, now stable  Objective: Vitals:   02/09/21 2059 02/09/21 2314 02/10/21 0505 02/10/21 1034  BP: (!) 98/57 (!) 90/55 (!) 109/58 102/63  Pulse: 95 100 84 76  Resp: 20 20 18 14   Temp: 98.8 F (37.1 C) 98.6 F (37 C) 98 F (36.7 C) 98 F (36.7 C)  TempSrc: Oral Oral Oral Oral  SpO2: 94% 98% 99% 98%  Weight:   (!) 154.3 kg   Height:        Intake/Output Summary (Last 24 hours) at 02/10/2021 1322 Last data filed at 02/10/2021 1045 Gross per 24 hour  Intake 190.52 ml  Output 350 ml  Net -159.48 ml   Filed Weights   02/08/21 1605 02/09/21 1200 02/10/21 0505  Weight: (!) 158.9 kg (!) 151.9 kg (!) 154.3 kg    Examination:  General exam: Obese pleasant female laying in bed, AAOx3, no distress CVS: S1-S2, regular rate rhythm Lungs: Decreased breath sounds at the bases Abdomen: Soft, nontender, bowel sounds present Extremities: 1 -  2 plus edema bilaterally, right lower leg erythema warmth and tenderness, small ulcer posteriorly with serosanguineous drainage  Skin: As above Psychiatry: Mood & affect appropriate.     Data Reviewed:   CBC: Recent Labs  Lab 02/08/21 1632 02/09/21 0448 02/10/21 0300  WBC 12.8* 11.9* 12.9*  NEUTROABS 10.5*  --   --   HGB 10.4* 9.9* 8.9*  HCT 36.9 34.0* 30.4*  MCV 88.1 87.2 85.9  PLT 487* 401* 387   Basic Metabolic Panel: Recent Labs  Lab 02/08/21 1632 02/09/21 0448 02/10/21 0300  NA 139  139 135  K 2.9* 2.7* 3.3*  CL 100 101 100  CO2 28 29 28   GLUCOSE 179* 194* 197*  BUN 12 10 12   CREATININE 1.07* 1.00 1.34*  CALCIUM 9.2 8.9 8.5*  MG  --  2.0  --    GFR: Estimated Creatinine Clearance: 70.7 mL/min (A) (by C-G formula based on SCr of 1.34 mg/dL (H)). Liver Function Tests: Recent Labs  Lab 02/08/21 1632  AST 18  ALT 16  ALKPHOS 90  BILITOT 0.7  PROT 6.7  ALBUMIN 2.8*   No results for input(s): LIPASE, AMYLASE in the last 168 hours. No results for input(s): AMMONIA in the last 168 hours. Coagulation Profile: No results for input(s): INR, PROTIME in the last 168 hours. Cardiac Enzymes: No results for input(s): CKTOTAL, CKMB, CKMBINDEX, TROPONINI in the last 168 hours. BNP (last 3 results) Recent Labs    03/20/20 1145 06/21/20 1129  PROBNP 125.0* 139.0*   HbA1C: No results for input(s): HGBA1C in the last 72 hours. CBG: Recent Labs  Lab 02/09/21 1152 02/09/21 1619 02/09/21 2138 02/10/21 0553 02/10/21 1128  GLUCAP 192* 233* 165* 165* 198*   Lipid Profile: No results for input(s): CHOL, HDL, LDLCALC, TRIG, CHOLHDL, LDLDIRECT in the last 72 hours. Thyroid Function Tests: No results for input(s): TSH, T4TOTAL, FREET4, T3FREE, THYROIDAB in the last 72 hours. Anemia Panel: No results for input(s): VITAMINB12, FOLATE, FERRITIN, TIBC, IRON, RETICCTPCT in the last 72 hours. Urine analysis:    Component Value Date/Time   COLORURINE YELLOW 06/21/2020 1129   APPEARANCEUR CLEAR 06/21/2020 1129   LABSPEC 1.010 06/21/2020 1129   PHURINE 5.5 06/21/2020 1129   GLUCOSEU >=1000 (A) 06/21/2020 1129   HGBUR NEGATIVE 06/21/2020 1129   BILIRUBINUR NEGATIVE 06/21/2020 1129   KETONESUR NEGATIVE 06/21/2020 1129   UROBILINOGEN 0.2 06/21/2020 1129   NITRITE NEGATIVE 06/21/2020 1129   LEUKOCYTESUR NEGATIVE 06/21/2020 1129   Sepsis Labs: @LABRCNTIP (procalcitonin:4,lacticidven:4)  ) Recent Results (from the past 240 hour(s))  Resp Panel by RT-PCR (Flu A&B,  Covid) Nasopharyngeal Swab     Status: None   Collection Time: 02/08/21  9:20 PM   Specimen: Nasopharyngeal Swab; Nasopharyngeal(NP) swabs in vial transport medium  Result Value Ref Range Status   SARS Coronavirus 2 by RT PCR NEGATIVE NEGATIVE Final    Comment: (NOTE) SARS-CoV-2 target nucleic acids are NOT DETECTED.  The SARS-CoV-2 RNA is generally detectable in upper respiratory specimens during the acute phase of infection. The lowest concentration of SARS-CoV-2 viral copies this assay can detect is 138 copies/mL. A negative result does not preclude SARS-Cov-2 infection and should not be used as the sole basis for treatment or other patient management decisions. A negative result may occur with  improper specimen collection/handling, submission of specimen other than nasopharyngeal swab, presence of viral mutation(s) within the areas targeted by this assay, and inadequate number of viral copies(<138 copies/mL). A negative result must be combined with clinical observations, patient  history, and epidemiological information. The expected result is Negative.  Fact Sheet for Patients:  BloggerCourse.com  Fact Sheet for Healthcare Providers:  SeriousBroker.it  This test is no t yet approved or cleared by the [place] FDA and  has been authorized for detection and/or diagnosis of SARS-CoV-2 by FDA under an Emergency Use Authorization (EUA). This EUA will remain  in effect (meaning this test can be used) for the duration of the COVID-19 declaration under Section 564(b)(1) of the Act, 21 U.S.C.section 360bbb-3(b)(1), unless the authorization is terminated  or revoked sooner.       Influenza A by PCR NEGATIVE NEGATIVE Final   Influenza B by PCR NEGATIVE NEGATIVE Final    Comment: (NOTE) The Xpert Xpress SARS-CoV-2/FLU/RSV plus assay is intended as an aid in the diagnosis of influenza from Nasopharyngeal swab specimens and should  not be used as a sole basis for treatment. Nasal washings and aspirates are unacceptable for Xpert Xpress SARS-CoV-2/FLU/RSV testing.  Fact Sheet for Patients: BloggerCourse.com  Fact Sheet for Healthcare Providers: SeriousBroker.it  This test is not yet approved or cleared by the [place] FDA and has been authorized for detection and/or diagnosis of SARS-CoV-2 by FDA under an Emergency Use Authorization (EUA). This EUA will remain in effect (meaning this test can be used) for the duration of the COVID-19 declaration under Section 564(b)(1) of the Act, 21 U.S.C. section 360bbb-3(b)(1), unless the authorization is terminated or revoked.  Performed at [facility], [address] [place], [place] [address]          Radiology Studies: DG Chest Portable 1 View  Result Date: 02/08/2021 CLINICAL DATA:  Dyspnea EXAM: PORTABLE CHEST 1 VIEW COMPARISON:  12/26/2020, 12/17/2020 FINDINGS: Cardiomegaly with vascular congestion. No focal opacity, pleural effusion or pneumothorax. Atelectasis or scarring in mid lung. IMPRESSION: Cardiomegaly with vascular congestion Electronically Signed   By: Jasmine Pang M.D.   On: 02/08/2021 20:56   VAS [place] LOWER EXTREMITY VENOUS (DVT) (ONLY [facility] & [facility])  Result Date: 02/09/2021  Lower Venous DVT Study Patient Name:  ARYEL EDELEN  Date of Exam:   02/09/2021 Medical Rec #: [id number]       Accession #:    [id number] Date of Birth: 1956-04-29       Patient Gender: F Patient Age:   40 years Exam Location:  [facility] Procedure:      VAS [place] LOWER EXTREMITY VENOUS (DVT) Referring Phys: JON KNAPP --------------------------------------------------------------------------------  Indications: Pain, and Swelling.  Anticoagulation: Eliquis (missed several doses) Limitations: Body habitus and pain with compression. Comparison Study: No prior study on file Performing Technologist: Sherren Kerns RVS  Examination  Guidelines: A complete evaluation includes B-mode imaging, spectral Doppler, color Doppler, and power Doppler as needed of all accessible portions of each vessel. Bilateral testing is considered an integral part of a complete examination. Limited examinations for reoccurring indications may be performed as noted. The reflux portion of the exam is performed with the patient in reverse Trendelenburg.  +---------+---------------+---------+-----------+----------+-------------------+ RIGHT    CompressibilityPhasicitySpontaneityPropertiesThrombus Aging      +---------+---------------+---------+-----------+----------+-------------------+ CFV      Full           Yes      Yes                                      +---------+---------------+---------+-----------+----------+-------------------+ SFJ      Full                                                             +---------+---------------+---------+-----------+----------+-------------------+  FV Prox  Full                                                             +---------+---------------+---------+-----------+----------+-------------------+ FV Mid   Full                                                             +---------+---------------+---------+-----------+----------+-------------------+ FV Distal               Yes      Yes                  patent with color                                                         and Doppler         +---------+---------------+---------+-----------+----------+-------------------+ PFV      Full                                                             +---------+---------------+---------+-----------+----------+-------------------+ POP      Partial        No       No                   Age Indeterminate   +---------+---------------+---------+-----------+----------+-------------------+ PERO                                                  Not well visualized  +---------+---------------+---------+-----------+----------+-------------------+ Soleal                                                Not well visualized +---------+---------------+---------+-----------+----------+-------------------+ Gastroc  Full                                                             +---------+---------------+---------+-----------+----------+-------------------+   +----+---------------+---------+-----------+----------+--------------+ LEFTCompressibilityPhasicitySpontaneityPropertiesThrombus Aging +----+---------------+---------+-----------+----------+--------------+ CFV Full           Yes      Yes                                 +----+---------------+---------+-----------+----------+--------------+     Summary: RIGHT: - Findings consistent with age indeterminate deep vein thrombosis involving the  right popliteal vein. - A cystic structure is found in the popliteal fossa.  LEFT: - No evidence of common femoral vein obstruction.  *See table(s) above for measurements and observations. Electronically signed by Waverly Ferrari MD on 02/09/2021 at 12:15:10 PM.    Final         Scheduled Meds:  apixaban  5 mg Oral BID   atorvastatin  40 mg Oral Daily   diltiazem  360 mg Oral Daily   ferrous sulfate  325 mg Oral Daily   furosemide  40 mg Oral BID   Gerhardt's butt cream   Topical BID   insulin aspart  0-9 Units Subcutaneous TID WC   metoprolol succinate  100 mg Oral Daily   potassium chloride  40 mEq Oral TID   pregabalin  50 mg Oral TID   sodium chloride flush  3 mL Intravenous Q12H   Continuous Infusions:   ceFAZolin (ANCEF) IV 2 g (02/10/21 0603)     LOS: 0 days    Time spent:    Zannie Cove, MD [facility]   02/10/2021, 1:22 PM

## 2021-02-10 NOTE — TOC Initial Note (Signed)
Transition of Care [facility]) - Initial/Assessment Note    Patient Details  Name: Pamela [name] MRN: [id number] Date of Birth: [birth year]  Transition of Care [facility]) CM/SW Contact:    Terrial Rhodes, LCSWA Phone Number: 02/10/2021, 10:13 AM  Clinical Narrative:                  CSW received consult for possible SNF placement at time of discharge. CSW spoke with patient regarding PT recommendation of SNF placement at time of discharge. Patient comes from home alone.Patient expressed understanding of PT recommendation and is agreeable to SNF placement at time of discharge. Patient gave CSW permission to fax out initial referral near the [place] area.  Patient has received the COVID vaccines as well as booster. No further questions reported at this time. CSW to continue to follow and assist with discharge planning needs.   Expected Discharge Plan: Skilled Nursing Facility Barriers to Discharge: Continued Medical Work up   Patient Goals and CMS Choice Patient states their goals for this hospitalization and ongoing recovery are:: to go to SNF CMS Medicare.gov Compare Post Acute Care list provided to:: Patient Choice offered to / list presented to : Patient  Expected Discharge Plan and Services Expected Discharge Plan: Skilled Nursing Facility In-house Referral: Clinical Social Work     Living arrangements for the past 2 months: Single Family Home                                      Prior Living Arrangements/Services Living arrangements for the past 2 months: Single Family Home Lives with:: Self Patient language and need for interpreter reviewed:: Yes Do you feel safe going back to the place where you live?: No   SNF  Need for Family Participation in Patient Care: Yes (Comment) Care giver support system in place?: Yes (comment)   Criminal Activity/Legal Involvement Pertinent to Current Situation/Hospitalization: No - Comment as needed  Activities of Daily Living Home  Assistive Devices/Equipment: Wheelchair, Environmental consultant (specify type), Bedside commode/3-in-1 ADL Screening (condition at time of admission) Patient's cognitive ability adequate to safely complete daily activities?: Yes Is the patient deaf or have difficulty hearing?: No Does the patient have difficulty seeing, even when wearing glasses/contacts?: No Does the patient have difficulty concentrating, remembering, or making decisions?: No Patient able to express need for assistance with ADLs?: Yes Does the patient have difficulty dressing or bathing?: Yes Independently performs ADLs?: No Communication: Independent Dressing (OT): Needs assistance Is this a change from baseline?: Pre-admission baseline Grooming: Needs assistance Is this a change from baseline?: Pre-admission baseline Feeding: Independent Bathing: Needs assistance Is this a change from baseline?: Pre-admission baseline Toileting: Needs assistance Is this a change from baseline?: Pre-admission baseline In/Out Bed: Dependent Is this a change from baseline?: Pre-admission baseline Does the patient have difficulty walking or climbing stairs?: Yes Weakness of Legs: Both Weakness of Arms/Hands: None  Permission Sought/Granted Permission sought to share information with : Case Manager, Family Supports, Oceanographer granted to share information with : Yes, Verbal Permission Granted  Share Information with NAME: Marcial Pacas  Permission granted to share info w AGENCY: SNF  Permission granted to share info w Relationship: brother  Permission granted to share info w Contact Information: Marcial Pacas [phone]  Emotional Assessment Appearance:: Appears stated age Attitude/Demeanor/Rapport: Gracious Affect (typically observed): Calm Orientation: : Oriented to Self, Oriented to Place, Oriented to  Time, Oriented to Situation (  WDL) Alcohol / Substance Use: Not Applicable    Admission diagnosis:  Positive D dimer  [R79.89] Cellulitis of right lower extremity [L03.115] Patient Active Problem List   Diagnosis Date Noted   Cellulitis of right lower extremity 02/08/2021   Hyperlipidemia associated with type 2 diabetes mellitus (HCC) 02/08/2021   Rectal bleeding    GI bleed 12/18/2020   Stercoral ulcer of rectum    Acute lower GI bleeding 12/13/2020   Chronic diastolic CHF (congestive heart failure) (HCC) 11/24/2020   Non-traumatic rhabdomyolysis 11/24/2020   Controlled type 2 diabetes mellitus with hyperglycemia (HCC) 11/24/2020   ARF (acute renal failure) (HCC) 11/18/2020   Elevated CK 11/18/2020   Acute pain of left knee 11/18/2020   AKI (acute kidney injury) (HCC) 11/18/2020   Chronic diastolic heart failure (HCC) 06/24/2020   Debility 06/21/2020   Vertigo 03/20/2020   SOB (shortness of breath) 03/20/2020   Left shoulder pain 02/27/2019   Acute upper respiratory infection 09/07/2018   Low back pain 12/01/2017   Diabetic foot ulcer (HCC) 03/03/2017   Charcot foot due to diabetes mellitus (HCC) 02/28/2017   Diabetic ulcer of toe of left foot associated with type 2 diabetes mellitus, limited to breakdown of skin (HCC) 02/28/2017   Obesity, Class III, BMI 40-49.9 (morbid obesity) (HCC) 02/28/2017   Chronic atrial fibrillation (HCC) 12/08/2015   Navicular fracture of ankle with malunion 05/30/2015   Neuropathic pain of foot 05/30/2015   Navicular fracture, foot 05/02/2015   Right ankle pain 04/19/2015   Cough 11/17/2014   Fatigue 09/25/2014   Ganglion cyst 02/06/2014   Bilateral hand pain 11/29/2012   Normocytic anemia 04/23/2012   CKD (chronic kidney disease) stage 3, GFR 30-59 ml/min (HCC) 04/23/2012   Hyperlipidemia 04/23/2012   Peripheral neuropathy 01/16/2012   DJD (degenerative joint disease) of knee 04/15/2011   Migraine 04/15/2011   Acute on chronic diastolic heart failure (HCC) 10/28/2010   Rosacea 10/11/2010   Anticoagulated 10/11/2010   Encounter for preventative adult health  care exam with abnormal findings 10/06/2010   Type 2 diabetes mellitus (HCC) 12/31/2007   Anxiety state 12/31/2007   Depression 12/31/2007   Hypertension associated with diabetes (HCC) 12/31/2007   ALLERGIC RHINITIS 12/31/2007   PCP:  Corwin Levins, MD Pharmacy:   [facility] [phone] [place], [place] - [address] AT [facility] [address] & [facility] [address] [place] [address] Phone: [phone] Fax: [phone]     Social Determinants of Health (SDOH) Interventions    Readmission Risk Interventions No flowsheet data found.

## 2021-02-10 NOTE — [place] FL2 (Signed)
[place] MEDICAID FL2 LEVEL OF CARE SCREENING TOOL     IDENTIFICATION  Patient Name: Pamela [name] Birthdate: [birth year] Sex: female Admission Date (Current Location): 02/08/2021  [facility] and [place] Number:  Producer, television/film/video and Address:  [facility], [address], [place], [place] [address]      Provider Number: [id number]  Attending Physician Name and Address:  Zannie Cove, MD  Relative Name and Phone Number:  Marcial Pacas [phone]    Current Level of Care: Hospital Recommended Level of Care: Skilled Nursing Facility Prior Approval Number:    Date Approved/Denied:   PASRR Number: [id number]  Discharge Plan: SNF    Current Diagnoses: Patient Active Problem List   Diagnosis Date Noted   Cellulitis of right lower extremity 02/08/2021   Hyperlipidemia associated with type 2 diabetes mellitus (HCC) 02/08/2021   Rectal bleeding    GI bleed 12/18/2020   Stercoral ulcer of rectum    Acute lower GI bleeding 12/13/2020   Chronic diastolic CHF (congestive heart failure) (HCC) 11/24/2020   Non-traumatic rhabdomyolysis 11/24/2020   Controlled type 2 diabetes mellitus with hyperglycemia (HCC) 11/24/2020   ARF (acute renal failure) (HCC) 11/18/2020   Elevated CK 11/18/2020   Acute pain of left knee 11/18/2020   AKI (acute kidney injury) (HCC) 11/18/2020   Chronic diastolic heart failure (HCC) 06/24/2020   Debility 06/21/2020   Vertigo 03/20/2020   SOB (shortness of breath) 03/20/2020   Left shoulder pain 02/27/2019   Acute upper respiratory infection 09/07/2018   Low back pain 12/01/2017   Diabetic foot ulcer (HCC) 03/03/2017   Charcot foot due to diabetes mellitus (HCC) 02/28/2017   Diabetic ulcer of toe of left foot associated with type 2 diabetes mellitus, limited to breakdown of skin (HCC) 02/28/2017   Obesity, Class III, BMI 40-49.9 (morbid obesity) (HCC) 02/28/2017   Chronic atrial fibrillation (HCC) 12/08/2015   Navicular  fracture of ankle with malunion 05/30/2015   Neuropathic pain of foot 05/30/2015   Navicular fracture, foot 05/02/2015   Right ankle pain 04/19/2015   Cough 11/17/2014   Fatigue 09/25/2014   Ganglion cyst 02/06/2014   Bilateral hand pain 11/29/2012   Normocytic anemia 04/23/2012   CKD (chronic kidney disease) stage 3, GFR 30-59 ml/min (HCC) 04/23/2012   Hyperlipidemia 04/23/2012   Peripheral neuropathy 01/16/2012   DJD (degenerative joint disease) of knee 04/15/2011   Migraine 04/15/2011   Acute on chronic diastolic heart failure (HCC) 10/28/2010   Rosacea 10/11/2010   Anticoagulated 10/11/2010   Encounter for preventative adult health care exam with abnormal findings 10/06/2010   Type 2 diabetes mellitus (HCC) 12/31/2007   Anxiety state 12/31/2007   Depression 12/31/2007   Hypertension associated with diabetes (HCC) 12/31/2007   ALLERGIC RHINITIS 12/31/2007    Orientation RESPIRATION BLADDER Height & Weight     Self, Time, Situation, Place (WDL)  O2 (Nasal Cannula 2 liters) Incontinent, External catheter (External urinary catheter) Weight: (!) 340 lb 2.7 oz (154.3 kg) Height:  6' (182.9 cm)  BEHAVIORAL SYMPTOMS/MOOD NEUROLOGICAL BOWEL NUTRITION STATUS      Incontinent Diet (Please see discharge summary)  AMBULATORY STATUS COMMUNICATION OF NEEDS Skin   Limited Assist Verbally Other (Comment) (cellulitis,leg,R,lower,Ecchymosis,chest,R,upper,PI Buttocks,R,stage 2,wound incis.open or dehiced non-pressure wound tibial posterior,proximal,R,open blister secondary to cellulitis,Please see additional info.)                       Personal Care Assistance Level of Assistance  Bathing, Feeding, Dressing Bathing Assistance:  Limited assistance Feeding assistance: Independent Dressing Assistance: Limited assistance     Functional Limitations Info  Sight, Hearing, Speech Sight Info: Impaired (wears glasses) Hearing Info: Adequate Speech Info: Adequate    SPECIAL CARE FACTORS  FREQUENCY  PT (By licensed PT), OT (By licensed OT)     PT Frequency: 5x min weekly OT Frequency: 5x min weekly            Contractures Contractures Info: Not present    Additional Factors Info  Code Status, Allergies, Insulin Sliding Scale Code Status Info: DNR Allergies Info: Cymbalta (duloxetine Hcl),Gabapentin   Insulin Sliding Scale Info: insulin aspart (novoLOG) injection 0-9 Units 3 times daily with meals       Current Medications (02/10/2021):  This is the current hospital active medication list Current Facility-Administered Medications  Medication Dose Route Frequency Provider Last Rate Last Admin   acetaminophen (TYLENOL) tablet 650 mg  650 mg Oral Q6H PRN Charlsie Quest, MD   650 mg at 02/09/21 2331   Or   acetaminophen (TYLENOL) suppository 650 mg  650 mg Rectal Q6H PRN Charlsie Quest, MD       albuterol (PROVENTIL) (2.5 MG/3ML) 0.083% nebulizer solution 2.5 mg  2.5 mg Inhalation Q6H PRN Charlsie Quest, MD       apixaban (ELIQUIS) tablet 5 mg  5 mg Oral BID Darreld Mclean R, MD   5 mg at 02/09/21 2233   atorvastatin (LIPITOR) tablet 40 mg  40 mg Oral Daily Darreld Mclean R, MD   40 mg at 02/09/21 0819   ceFAZolin (ANCEF) IVPB 2g/100 mL premix  2 g Intravenous Q8H Darreld Mclean R, MD 200 mL/hr at 02/10/21 0603 2 g at 02/10/21 0603   diltiazem (CARDIZEM CD) 24 hr capsule 360 mg  360 mg Oral Daily Darreld Mclean R, MD   360 mg at 02/09/21 1010   ferrous sulfate tablet 325 mg  325 mg Oral Daily Darreld Mclean R, MD   325 mg at 02/09/21 0820   furosemide (LASIX) tablet 40 mg  40 mg Oral BID Zannie Cove, MD       Gerhardt's butt cream   Topical BID Zannie Cove, MD       insulin aspart (novoLOG) injection 0-9 Units  0-9 Units Subcutaneous TID WC Darreld Mclean R, MD   2 Units at 02/10/21 1610   metoprolol succinate (TOPROL-XL) 24 hr tablet 100 mg  100 mg Oral Daily Darreld Mclean R, MD   100 mg at 02/09/21 0819   ondansetron (ZOFRAN) tablet 4 mg  4 mg Oral Q6H PRN Charlsie Quest, MD       Or   ondansetron (ZOFRAN) injection 4 mg  4 mg Intravenous Q6H PRN Charlsie Quest, MD       potassium chloride SA (KLOR-CON) CR tablet 40 mEq  40 mEq Oral TID Zannie Cove, MD       pregabalin (LYRICA) capsule 50 mg  50 mg Oral TID Charlsie Quest, MD   50 mg at 02/09/21 2233   senna-docusate (Senokot-S) tablet 1 tablet  1 tablet Oral QHS PRN Darreld Mclean R, MD       sodium chloride flush (NS) 0.9 % injection 3 mL  3 mL Intravenous Q12H Charlsie Quest, MD   3 mL at 02/09/21 2234     Discharge Medications: Please see discharge summary for a list of discharge medications.  Relevant Imaging Results:  Relevant Lab Results:   Additional Information [id number] has received both  Covid Vaccines as well as booster,wound incis.open or dehiced (ITD) Intertriginous Dermatitis Buttocks,circumferential,red and raised rash  Terrial Rhodes, LCSWA

## 2021-02-11 DIAGNOSIS — L03115 Cellulitis of right lower limb: Secondary | ICD-10-CM | POA: Diagnosis not present

## 2021-02-11 DIAGNOSIS — L8990 Pressure ulcer of unspecified site, unspecified stage: Secondary | ICD-10-CM | POA: Insufficient documentation

## 2021-02-11 LAB — BASIC METABOLIC PANEL
Anion gap: 7 (ref 5–15)
BUN: 13 mg/dL (ref 8–23)
CO2: 30 mmol/L (ref 22–32)
Calcium: 9 mg/dL (ref 8.9–10.3)
Chloride: 98 mmol/L (ref 98–111)
Creatinine, Ser: 1.42 mg/dL — ABNORMAL HIGH (ref 0.44–1.00)
GFR, Estimated: 41 mL/min — ABNORMAL LOW (ref >60)
Glucose, Bld: 212 mg/dL — ABNORMAL HIGH (ref 70–99)
Potassium: 3.7 mmol/L (ref 3.5–5.1)
Sodium: 135 mmol/L (ref 135–145)

## 2021-02-11 LAB — GLUCOSE, CAPILLARY
Glucose-Capillary: 199 mg/dL — ABNORMAL HIGH (ref 70–99)
Glucose-Capillary: 214 mg/dL — ABNORMAL HIGH (ref 70–99)
Glucose-Capillary: 223 mg/dL — ABNORMAL HIGH (ref 70–99)
Glucose-Capillary: 196 mg/dL — ABNORMAL HIGH (ref 70–99)

## 2021-02-11 LAB — CBC
HCT: 31.3 % — ABNORMAL LOW (ref 36.0–46.0)
Hemoglobin: 9 g/dL — ABNORMAL LOW (ref 12.0–15.0)
MCH: 24.8 pg — ABNORMAL LOW (ref 26.0–34.0)
MCHC: 28.8 g/dL — ABNORMAL LOW (ref 30.0–36.0)
MCV: 86.2 fL (ref 80.0–100.0)
Platelets: 369 10*3/uL (ref 150–400)
RBC: 3.63 MIL/uL — ABNORMAL LOW (ref 3.87–5.11)
RDW: 18.2 % — ABNORMAL HIGH (ref 11.5–15.5)
WBC: 11.6 10*3/uL — ABNORMAL HIGH (ref 4.0–10.5)
nRBC: 0.2 % (ref 0.0–0.2)

## 2021-02-11 MED ORDER — INSULIN GLARGINE-YFGN 100 UNIT/ML ~~LOC~~ SOLN
15.0000 [IU] | Freq: Every day | SUBCUTANEOUS | Status: DC
  Administered 2021-02-11 – 2021-02-12 (×2): 15 [IU] via SUBCUTANEOUS
  Filled 2021-02-11 (×4): qty 0.15

## 2021-02-11 MED ORDER — LOPERAMIDE HCL 2 MG PO CAPS: 2.0000 mg | ORAL_CAPSULE | Freq: Two times a day (BID) | ORAL | Status: DC | PRN

## 2021-02-11 MED ORDER — POTASSIUM CHLORIDE CRYS ER 20 MEQ PO TBCR
20.0000 meq | EXTENDED_RELEASE_TABLET | Freq: Two times a day (BID) | ORAL | Status: DC
  Administered 2021-02-11 – 2021-02-12 (×4): 20 meq via ORAL
  Filled 2021-02-11 (×4): qty 1

## 2021-02-11 NOTE — TOC Progression Note (Signed)
Transition of Care [facility]) - Progression Note    Patient Details  Name: Pamela [name] MRN: [id number] Date of Birth: Feb 25, 1956  [facility]) CM/SW Contact  Okey Dupre Lazaro Arms, LCSW Phone Number: 02/11/2021, 3:05 PM  Clinical Narrative:  Visited patient's room to provide facility responses (2:09 pm) and patient was asleep. [name] did not awaken after her name was called more than once. A CSW will follow-up with patient to provide facility responses.      Expected Discharge Plan: Skilled Nursing Facility Barriers to Discharge: Continued Medical Work up  Expected Discharge Plan and Services Expected Discharge Plan: Skilled Nursing Facility In-house Referral: Clinical Social Work     Living arrangements for the past 2 months: Single Family Home                                       Social Determinants of Health (SDOH) Interventions    Readmission Risk Interventions No flowsheet data found.

## 2021-02-11 NOTE — Progress Notes (Signed)
PROGRESS NOTE    Pamela [name]  VQM:[id number] DOB: 1955-08-08 DOA: 02/08/2021 PCP: Corwin Levins, MD  Brief Narrative:Pamela [name] is a 65 y.o. female with medical history significant for permanent atrial fibrillation on Eliquis, chronic diastolic CHF (EF 93-26%), CKD stage IIIa, T2DM, HTN, HLD, depression/anxiety presented to the ED for evaluation of right leg swelling. -Recently hospitalized 12/17/2020-12/21/2020 for lower GI bleeding related to diverticulosis and stercoral ulcer.  She was discharged to SNF.  Patient states she was sent home from her SNF on 02/07/2021 as her insurance would not pay for further days continues to have knee and hip problems with generalized debility.  Reportedly also did not get a prescription for Eliquis and had not taken it for last 3 days at -On 9/1 she noticed increased swelling and erythema to her right lower leg from above her ankle halfway up the shin.  She noticed a sore on the back of her leg that was having some clear drainage   Assessment & Plan:   Cellulitis of right lower extremity: Age indeterminant right leg DVT -Exam is consistent with cellulitis, slowly improving, continue IV Ancef -Eliquis resumed -Keep leg elevated -Lasix dose increased to twice daily, monitor BMP -PT OT eval completed, SNF recommended -Discharge planning   Permanent atrial fibrillation: -Continue Eliquis as above.  -continue diltiazem and Toprol-XL.   Chronic diastolic CHF: -Has chronic bilateral lower extremity edema,  -Given Lasix IV x1 on admission, continue p.o., increased dose, BMP in a.m.  General debility Chronic bilateral knee pain, right hip pain -PT OT eval completed, SNF recommended -TOC consulted   CKD stage IIIa: Stable, continue to monitor.   Hypokalemia -Replaced   Type 2 diabetes: Hold oral hypoglycemics, continue sliding scale insulin    Hypertension: -Continue home diltiazem,Toprol-XL, Lasix. -Hold losartan   Hyperlipidemia: Continue  atorvastatin.   Diabetic neuropathy: Continue Lyrica.   DVT prophylaxis: Eliquis Code Status: DNR Family Communication: No family at bedside, discussed with patient in detail Disposition Plan:  Status is: Observation  The patient will require care spanning > 2 midnights and should be moved to inpatient because: Cellulitis requiring IV antibiotics  Dispo: The patient is from: SNF              Anticipated d/c is to: SNF              Patient currently is not medically stable to d/c.   Difficult to place patient No   Procedures:   Antimicrobials:    Subjective: -Overall improving, continues to have moderate discomfort in her right leg, breathing overall better  Objective: Vitals:   02/11/21 0038 02/11/21 0441 02/11/21 0900 02/11/21 1043  BP: 104/60 118/62  136/72  Pulse: 74 91  95  Resp: 18 17  16   Temp: 98.6 F (37 C) 97.6 F (36.4 C)  97.9 F (36.6 C)  TempSrc: Oral Oral  Oral  SpO2: 95% 96% 98% 98%  Weight: (!) 155.9 kg     Height:        Intake/Output Summary (Last 24 hours) at 02/11/2021 1119 Last data filed at 02/11/2021 1046 Gross per 24 hour  Intake 355.34 ml  Output 900 ml  Net -544.66 ml   Filed Weights   02/09/21 1200 02/10/21 0505 02/11/21 0038  Weight: (!) 151.9 kg (!) 154.3 kg (!) 155.9 kg    Examination:  General exam: Obese pleasant female sitting up in bed, AAOx3, no distress CVS: S1-S2, regular rate rhythm Lungs: Decreased breath sounds to bases  Abdomen: Soft, nontender, bowel sounds present  Extremities: 1 -2 plus edema bilaterally, right lower leg erythema warmth and tenderness, small ulcer posteriorly with serosanguineous drainage  Skin: As above Psych: Appropriate mood and affect   Data Reviewed:   CBC: Recent Labs  Lab 02/08/21 1632 02/09/21 0448 02/10/21 0300 02/11/21 0146  WBC 12.8* 11.9* 12.9* 11.6*  NEUTROABS 10.5*  --   --   --   HGB 10.4* 9.9* 8.9* 9.0*  HCT 36.9 34.0* 30.4* 31.3*  MCV 88.1 87.2 85.9 86.2  PLT 487*  401* 387 369   Basic Metabolic Panel: Recent Labs  Lab 02/08/21 1632 02/09/21 0448 02/10/21 0300 02/11/21 0146  NA 139 139 135 135  K 2.9* 2.7* 3.3* 3.7  CL 100 101 100 98  CO2 28 29 28 30   GLUCOSE 179* 194* 197* 212*  BUN 12 10 12 13   CREATININE 1.07* 1.00 1.34* 1.42*  CALCIUM 9.2 8.9 8.5* 9.0  MG  --  2.0  --   --    GFR: Estimated Creatinine Clearance: 67.1 mL/min (A) (by C-G formula based on SCr of 1.42 mg/dL (H)). Liver Function Tests: Recent Labs  Lab 02/08/21 1632  AST 18  ALT 16  ALKPHOS 90  BILITOT 0.7  PROT 6.7  ALBUMIN 2.8*   No results for input(s): LIPASE, AMYLASE in the last 168 hours. No results for input(s): AMMONIA in the last 168 hours. Coagulation Profile: No results for input(s): INR, PROTIME in the last 168 hours. Cardiac Enzymes: No results for input(s): CKTOTAL, CKMB, CKMBINDEX, TROPONINI in the last 168 hours. BNP (last 3 results) Recent Labs    03/20/20 1145 06/21/20 1129  PROBNP 125.0* 139.0*   HbA1C: No results for input(s): HGBA1C in the last 72 hours. CBG: Recent Labs  Lab 02/10/21 1128 02/10/21 1616 02/10/21 2040 02/11/21 0556 02/11/21 1102  GLUCAP 198* 215* 206* 199* 214*   Lipid Profile: No results for input(s): CHOL, HDL, LDLCALC, TRIG, CHOLHDL, LDLDIRECT in the last 72 hours. Thyroid Function Tests: No results for input(s): TSH, T4TOTAL, FREET4, T3FREE, THYROIDAB in the last 72 hours. Anemia Panel: No results for input(s): VITAMINB12, FOLATE, FERRITIN, TIBC, IRON, RETICCTPCT in the last 72 hours. Urine analysis:    Component Value Date/Time   COLORURINE YELLOW 06/21/2020 1129   APPEARANCEUR CLEAR 06/21/2020 1129   LABSPEC 1.010 06/21/2020 1129   PHURINE 5.5 06/21/2020 1129   GLUCOSEU >=1000 (A) 06/21/2020 1129   HGBUR NEGATIVE 06/21/2020 1129   BILIRUBINUR NEGATIVE 06/21/2020 1129   KETONESUR NEGATIVE 06/21/2020 1129   UROBILINOGEN 0.2 06/21/2020 1129   NITRITE NEGATIVE 06/21/2020 1129   LEUKOCYTESUR  NEGATIVE 06/21/2020 1129   Sepsis Labs: @LABRCNTIP (procalcitonin:4,lacticidven:4)  ) Recent Results (from the past 240 hour(s))  Resp Panel by RT-PCR (Flu A&B, Covid) Nasopharyngeal Swab     Status: None   Collection Time: 02/08/21  9:20 PM   Specimen: Nasopharyngeal Swab; Nasopharyngeal(NP) swabs in vial transport medium  Result Value Ref Range Status   SARS Coronavirus 2 by RT PCR NEGATIVE NEGATIVE Final    Comment: (NOTE) SARS-CoV-2 target nucleic acids are NOT DETECTED.  The SARS-CoV-2 RNA is generally detectable in upper respiratory specimens during the acute phase of infection. The lowest concentration of SARS-CoV-2 viral copies this assay can detect is 138 copies/mL. A negative result does not preclude SARS-Cov-2 infection and should not be used as the sole basis for treatment or other patient management decisions. A negative result may occur with  improper specimen collection/handling, submission of specimen other than  nasopharyngeal swab, presence of viral mutation(s) within the areas targeted by this assay, and inadequate number of viral copies(<138 copies/mL). A negative result must be combined with clinical observations, patient history, and epidemiological information. The expected result is Negative.  Fact Sheet for Patients:  BloggerCourse.com  Fact Sheet for Healthcare Providers:  SeriousBroker.it  This test is no t yet approved or cleared by the [place] FDA and  has been authorized for detection and/or diagnosis of SARS-CoV-2 by FDA under an Emergency Use Authorization (EUA). This EUA will remain  in effect (meaning this test can be used) for the duration of the COVID-19 declaration under Section 564(b)(1) of the Act, 21 U.S.C.section 360bbb-3(b)(1), unless the authorization is terminated  or revoked sooner.       Influenza A by PCR NEGATIVE NEGATIVE Final   Influenza B by PCR NEGATIVE NEGATIVE Final     Comment: (NOTE) The Xpert Xpress SARS-CoV-2/FLU/RSV plus assay is intended as an aid in the diagnosis of influenza from Nasopharyngeal swab specimens and should not be used as a sole basis for treatment. Nasal washings and aspirates are unacceptable for Xpert Xpress SARS-CoV-2/FLU/RSV testing.  Fact Sheet for Patients: BloggerCourse.com  Fact Sheet for Healthcare Providers: SeriousBroker.it  This test is not yet approved or cleared by the [place] FDA and has been authorized for detection and/or diagnosis of SARS-CoV-2 by FDA under an Emergency Use Authorization (EUA). This EUA will remain in effect (meaning this test can be used) for the duration of the COVID-19 declaration under Section 564(b)(1) of the Act, 21 U.S.C. section 360bbb-3(b)(1), unless the authorization is terminated or revoked.  Performed at [facility], [address] [place], [place] [address]      Scheduled Meds:  apixaban  5 mg Oral BID   atorvastatin  40 mg Oral Daily   diltiazem  360 mg Oral Daily   ferrous sulfate  325 mg Oral Daily   furosemide  40 mg Oral BID   Gerhardt's butt cream   Topical BID   insulin aspart  0-9 Units Subcutaneous TID WC   insulin glargine-yfgn  15 Units Subcutaneous Daily   metoprolol succinate  100 mg Oral Daily   potassium chloride  20 mEq Oral BID   pregabalin  50 mg Oral TID   sodium chloride flush  3 mL Intravenous Q12H   Continuous Infusions:   ceFAZolin (ANCEF) IV 2 g (02/11/21 0630)     LOS: 0 days    Time spent:  Zannie Cove, MD [facility]   02/11/2021, 11:19 AM

## 2021-02-11 NOTE — Plan of Care (Signed)
  Problem: Coping: Goal: Level of anxiety will decrease Outcome: Progressing   Problem: Safety: Goal: Ability to remain free from injury will improve Outcome: Progressing   Problem: Education: Goal: Ability to demonstrate management of disease process will improve Outcome: Progressing

## 2021-02-12 DIAGNOSIS — J302 Other seasonal allergic rhinitis: Secondary | ICD-10-CM | POA: Diagnosis present

## 2021-02-12 DIAGNOSIS — Z6841 Body Mass Index (BMI) 40.0 and over, adult: Secondary | ICD-10-CM | POA: Diagnosis not present

## 2021-02-12 DIAGNOSIS — F32A Depression, unspecified: Secondary | ICD-10-CM | POA: Diagnosis present

## 2021-02-12 DIAGNOSIS — I482 Chronic atrial fibrillation, unspecified: Secondary | ICD-10-CM | POA: Diagnosis not present

## 2021-02-12 DIAGNOSIS — N1831 Chronic kidney disease, stage 3a: Secondary | ICD-10-CM | POA: Diagnosis present

## 2021-02-12 DIAGNOSIS — I5032 Chronic diastolic (congestive) heart failure: Secondary | ICD-10-CM | POA: Diagnosis present

## 2021-02-12 DIAGNOSIS — R7989 Other specified abnormal findings of blood chemistry: Secondary | ICD-10-CM | POA: Diagnosis present

## 2021-02-12 DIAGNOSIS — I130 Hypertensive heart and chronic kidney disease with heart failure and stage 1 through stage 4 chronic kidney disease, or unspecified chronic kidney disease: Secondary | ICD-10-CM | POA: Diagnosis present

## 2021-02-12 DIAGNOSIS — E1140 Type 2 diabetes mellitus with diabetic neuropathy, unspecified: Secondary | ICD-10-CM | POA: Diagnosis present

## 2021-02-12 DIAGNOSIS — E11622 Type 2 diabetes mellitus with other skin ulcer: Secondary | ICD-10-CM | POA: Diagnosis present

## 2021-02-12 DIAGNOSIS — Z7901 Long term (current) use of anticoagulants: Secondary | ICD-10-CM | POA: Diagnosis not present

## 2021-02-12 DIAGNOSIS — I4821 Permanent atrial fibrillation: Secondary | ICD-10-CM | POA: Diagnosis present

## 2021-02-12 DIAGNOSIS — I152 Hypertension secondary to endocrine disorders: Secondary | ICD-10-CM | POA: Diagnosis present

## 2021-02-12 DIAGNOSIS — E1169 Type 2 diabetes mellitus with other specified complication: Secondary | ICD-10-CM | POA: Diagnosis present

## 2021-02-12 DIAGNOSIS — Z8616 Personal history of COVID-19: Secondary | ICD-10-CM | POA: Diagnosis not present

## 2021-02-12 DIAGNOSIS — G8929 Other chronic pain: Secondary | ICD-10-CM | POA: Diagnosis present

## 2021-02-12 DIAGNOSIS — Z20822 Contact with and (suspected) exposure to covid-19: Secondary | ICD-10-CM | POA: Diagnosis present

## 2021-02-12 DIAGNOSIS — L89312 Pressure ulcer of right buttock, stage 2: Secondary | ICD-10-CM | POA: Diagnosis present

## 2021-02-12 DIAGNOSIS — E876 Hypokalemia: Secondary | ICD-10-CM | POA: Diagnosis present

## 2021-02-12 DIAGNOSIS — Z23 Encounter for immunization: Secondary | ICD-10-CM | POA: Diagnosis not present

## 2021-02-12 DIAGNOSIS — Z66 Do not resuscitate: Secondary | ICD-10-CM | POA: Diagnosis present

## 2021-02-12 DIAGNOSIS — L03115 Cellulitis of right lower limb: Secondary | ICD-10-CM | POA: Diagnosis present

## 2021-02-12 DIAGNOSIS — I82431 Acute embolism and thrombosis of right popliteal vein: Secondary | ICD-10-CM | POA: Diagnosis present

## 2021-02-12 DIAGNOSIS — E1122 Type 2 diabetes mellitus with diabetic chronic kidney disease: Secondary | ICD-10-CM | POA: Diagnosis present

## 2021-02-12 DIAGNOSIS — N179 Acute kidney failure, unspecified: Secondary | ICD-10-CM | POA: Diagnosis present

## 2021-02-12 LAB — GLUCOSE, CAPILLARY
Glucose-Capillary: 196 mg/dL — ABNORMAL HIGH (ref 70–99)
Glucose-Capillary: 242 mg/dL — ABNORMAL HIGH (ref 70–99)
Glucose-Capillary: 260 mg/dL — ABNORMAL HIGH (ref 70–99)
Glucose-Capillary: 186 mg/dL — ABNORMAL HIGH (ref 70–99)

## 2021-02-12 LAB — BASIC METABOLIC PANEL
Anion gap: 6 (ref 5–15)
BUN: 13 mg/dL (ref 8–23)
CO2: 29 mmol/L (ref 22–32)
Calcium: 8.8 mg/dL — ABNORMAL LOW (ref 8.9–10.3)
Chloride: 101 mmol/L (ref 98–111)
Creatinine, Ser: 1.13 mg/dL — ABNORMAL HIGH (ref 0.44–1.00)
GFR, Estimated: 54 mL/min — ABNORMAL LOW (ref >60)
Glucose, Bld: 218 mg/dL — ABNORMAL HIGH (ref 70–99)
Potassium: 3.5 mmol/L (ref 3.5–5.1)
Sodium: 136 mmol/L (ref 135–145)

## 2021-02-12 NOTE — TOC Progression Note (Addendum)
Transition of Care [facility]) - Progression Note    Patient Details  Name: Pamela [name] MRN: [id number] Date of Birth: [birth year]  Transition of Care [facility]) CM/SW [place], [place] Phone Number: 02/12/2021, 9:57 AM  Clinical Narrative:     CSW met with pt and provided bed offer. Pt is agreeable to [facility]. Pt states she had a family member there at one point. Pt is covid vaccinated with 1 booster.   CSW contacted [facility] to confirm; awaiting response.   [facility] can accept pt. They will start auth.   Expected Discharge Plan: [place] Barriers to Discharge: Continued Medical Work up  Expected Discharge Plan and Services Expected Discharge Plan: [place] In-house Referral: Clinical Social Work     Living arrangements for the past 2 months: Single Family Home                                       Social Determinants of Health (SDOH) Interventions    Readmission Risk Interventions No flowsheet data found.

## 2021-02-12 NOTE — Progress Notes (Signed)
Physical Therapy Treatment Patient Details Name: Pamela [name] MRN: [id number] DOB: Apr 16, 1956 Today's Date: 02/12/2021    History of Present Illness Pt is a 65 y.o. female who presented 02/08/21 with R leg swelling and erythema. Per chart: "Patient was hospitalized 12/17/2020-12/21/2020 for lower GI bleeding related to diverticulosis and stercoral ulcer" and then discharged to SNF then home. Chest x-ray shows cardiomegaly with vascular congestion. Pt admitted with R lower extremiity cellulitis and age indeterminant R leg DVT. PMH: anxiety, permanent a-fib, chronic diastolic CHF, depression, DM2, diastolic dysfunction, HTN, migraines, R knee DJD, rosacea.    PT Comments    Pt supine in bed agreeable to try standing with therapy. Pt limited in safe mobility by decreased strength and R knee pain with attempts to come to upright. Pt unaware of incontinence of stool is supervision for rolling for pericare. Min guard for bed mobility. Attempted standing multiple times however unable to clear hips and bear weight on LE. Pt able to scoot along EoB with min guard. D/c plans remain appropriate at this time. PT will continue to follow acutely.    Follow Up Recommendations  SNF;Supervision for mobility/OOB     Equipment Recommendations  Rolling walker with 5" wheels;3in1 (PT);Wheelchair (measurements PT);Wheelchair cushion (measurements PT);Hospital bed;Other (comment) (drop arm for 3in1 and w/c; sliding board; bariatric for all equipment)       Precautions / Restrictions Precautions Precautions: Fall Precaution Comments: DVT R leg Restrictions Weight Bearing Restrictions: No    Mobility  Bed Mobility Overal bed mobility: Needs Assistance   Rolling: Supervision   Supine to sit: HOB elevated;Min guard Sit to supine: Min guard   General bed mobility comments: with use of bed rail pt does not require outside assist for bed mobility    Transfers Overall transfer level: Needs  assistance Equipment used: Ambulation equipment used Transfers: Lateral/Scoot Transfers;Sit to/from Stand Sit to Stand: Total assist        Lateral/Scoot Transfers: Min guard General transfer comment: pt would be total A for coming to fully upright, min guard for lateral scoot transfer from foot of bed towards HoB for proper hip placement in bed  Ambulation/Gait             General Gait Details: unable       Balance Overall balance assessment: Needs assistance Sitting-balance support: No upper extremity supported;Feet supported Sitting balance-[name]: Good                                      Cognition Arousal/Alertness: Awake/alert Behavior During Therapy: WFL for tasks assessed/performed Overall Cognitive Status: Within Functional Limits for tasks assessed                                           General Comments General comments (skin integrity, edema, etc.): Pt on 2L O2 via [place] with SaO2 100%O2, does not use at home, removed supplemental O2 and able to maintain SaO2 >93%O2 throughout session      Pertinent Vitals/Pain Pain Assessment: No/denies pain Pain Intervention(s): Monitored during session     PT Goals (current goals can now be found in the care plan section) Acute Rehab PT Goals Patient Stated Goal: to get stronger PT Goal Formulation: With patient Time For Goal Achievement: 02/23/21 Potential to Achieve Goals: Fair Progress towards PT  goals: Progressing toward goals    Frequency    Min 2X/week      PT Plan Current plan remains appropriate    Co-evaluation PT/OT/SLP Co-Evaluation/Treatment: Yes Reason for Co-Treatment: To address functional/ADL transfers PT goals addressed during session: Mobility/safety with mobility OT goals addressed during session: ADL's and self-care      AM-PAC PT "6 Clicks" Mobility   Outcome Measure  Help needed turning from your back to your side while in a flat bed  without using bedrails?: None Help needed moving from lying on your back to sitting on the side of a flat bed without using bedrails?: A Little Help needed moving to and from a bed to a chair (including a wheelchair)?: Total Help needed standing up from a chair using your arms (e.g., wheelchair or bedside chair)?: Total Help needed to walk in hospital room?: Total Help needed climbing 3-5 steps with a railing? : Total 6 Click Score: 11    End of Session Equipment Utilized During Treatment: Gait belt Activity Tolerance: Patient tolerated treatment well Patient left: in bed;with call bell/phone within reach;with bed alarm set Nurse Communication: Mobility status PT Visit Diagnosis: Unsteadiness on feet (R26.81);Muscle weakness (generalized) (M62.81);Difficulty in walking, not elsewhere classified (R26.2);Pain Pain - Right/Left: Right Pain - part of body: Leg     Time: 4536-4680 PT Time Calculation (min) (ACUTE ONLY): 44 min  Charges:  $Therapeutic Activity: 8-22 mins                     Sherman Lipuma B. Beverely Risen PT, DPT Acute Rehabilitation Services Pager [phone] Office [phone]    [facility]on Alas [facility] 02/12/2021, 2:37 PM

## 2021-02-12 NOTE — Progress Notes (Signed)
Occupational Therapy Treatment Patient Details Name: Pamela [name] MRN: [id number] DOB: 1955/09/21 Today's Date: 02/12/2021    History of present illness Pt is a 65 y.o. female who presented 02/08/21 with R leg swelling and erythema. Per chart: "Patient was hospitalized 12/17/2020-12/21/2020 for lower GI bleeding related to diverticulosis and stercoral ulcer" and then discharged to SNF then home. Chest x-ray shows cardiomegaly with vascular congestion. Pt admitted with R lower extremiity cellulitis and age indeterminant R leg DVT. PMH: anxiety, permanent a-fib, chronic diastolic CHF, depression, DM2, diastolic dysfunction, HTN, migraines, R knee DJD, rosacea.   OT comments  Pt was cooperative during session. Pt. Was able to   roll herself in bed for peri hygiene. Pt. Was min  guard for supine to sit and sat eob for adls. Pt. Was setup for grooming. Pt. Was min a with donning and donning of gown. Pt. Dep with le adls. Pt. Attempted to stand multiple times with steady lift. Pt. Was able to scoot up to head of bed with min guard assist. Pt. Acute ot to follow.   Follow Up Recommendations  SNF    Equipment Recommendations  None recommended by OT    Recommendations for Other Services      Precautions / Restrictions Precautions Precautions: Fall Precaution Comments: DVT R leg Restrictions Weight Bearing Restrictions: No       Mobility Bed Mobility     Rolling: Supervision   Supine to sit: HOB elevated;Min guard Sit to supine: Min guard        Transfers Overall transfer level: Needs assistance               General transfer comment: attempted to have pt. stand multiple times with steady and pt. was unable.    Balance     Sitting balance-[name]: Good                                     ADL either performed or assessed with clinical judgement   ADL Overall ADL's : Needs assistance/impaired Eating/Feeding: Independent   Grooming: Wash/dry  hands;Wash/dry face;Oral care;Brushing hair;Set up;Sitting   Upper Body Bathing: Minimal assistance;Sitting   Lower Body Bathing: Maximal assistance;Bed level;Sitting/lateral leans   Upper Body Dressing : Minimal assistance;Sitting   Lower Body Dressing: Total assistance;Bed level   Toilet Transfer: Total assistance Toilet Transfer Details (indicate cue type and reason): pt unable Toileting- Clothing Manipulation and Hygiene: Total assistance;Bed level       Functional mobility during ADLs: Total assistance General ADL Comments: Pt. has edema in b feet and is not able to use sock  donner currently.     Vision   Vision Assessment?: No apparent visual deficits   Perception     Praxis      Cognition Arousal/Alertness: Awake/alert Behavior During Therapy: WFL for tasks assessed/performed Overall Cognitive Status: Within Functional Limits for tasks assessed                                          Exercises     Shoulder Instructions       General Comments      Pertinent Vitals/ Pain       Pain Assessment: No/denies pain Pain Intervention(s): Monitored during session  Home Living  Prior Functioning/Environment              Frequency  Min 2X/week        Progress Toward Goals  OT Goals(current goals can now be found in the care plan section)  Progress towards OT goals: Progressing toward goals  Acute Rehab OT Goals Patient Stated Goal: to get stronger OT Goal Formulation: With patient Time For Goal Achievement: 02/24/21 Potential to Achieve Goals: Good ADL Goals Pt Will Perform Upper Body Bathing: with set-up;with supervision;sitting Pt Will Perform Lower Body Bathing: with mod assist;sitting/lateral leans;bed level Pt Will Perform Upper Body Dressing: with set-up;sitting Pt Will Perform Lower Body Dressing: with mod assist;with adaptive equipment;bed level;sitting/lateral  leans Pt Will Transfer to Toilet: with mod assist;with +2 assist;with transfer board;bedside commode Pt/caregiver will Perform Home Exercise Program: Increased strength;Right Upper extremity;Left upper extremity;With theraband;Independently;With written HEP provided  Plan Discharge plan remains appropriate    Co-evaluation    PT/OT/SLP Co-Evaluation/Treatment: Yes Reason for Co-Treatment: To address functional/ADL transfers;For patient/therapist safety   OT goals addressed during session: ADL's and self-care      AM-PAC OT "6 Clicks" Daily Activity     Outcome Measure   Help from another person eating meals?: A Little Help from another person taking care of personal grooming?: A Little Help from another person toileting, which includes using toliet, bedpan, or urinal?: A Lot Help from another person bathing (including washing, rinsing, drying)?: A Lot Help from another person to put on and taking off regular upper body clothing?: A Little Help from another person to put on and taking off regular lower body clothing?: Total 6 Click Score: 14    End of Session Equipment Utilized During Treatment: Gait belt  OT Visit Diagnosis: Unsteadiness on feet (R26.81);Muscle weakness (generalized) (M62.81)   Activity Tolerance Patient tolerated treatment well   Patient Left in bed;with bed alarm set;with call bell/phone within reach   Nurse Communication  (ok therapy)        Time: 7425-9563 OT Time Calculation (min): 46 min  Charges: OT General Charges $OT Visit: 1 Visit OT Treatments $Self Care/Home Management : 8-22 mins $Therapeutic Activity: 8-22 mins  Derrek Gu OT/L   Naidelyn Parrella 02/12/2021, 11:17 AM

## 2021-02-12 NOTE — Plan of Care (Signed)
  Problem: Clinical Measurements: Goal: Ability to maintain clinical measurements within normal limits will improve Outcome: Progressing Goal: Will remain free from infection Outcome: Progressing Goal: Diagnostic test results will improve Outcome: Progressing Goal: Respiratory complications will improve Outcome: Progressing Goal: Cardiovascular complication will be avoided Outcome: Progressing   Problem: Activity: Goal: Risk for activity intolerance will decrease Outcome: Progressing   Problem: Nutrition: Goal: Adequate nutrition will be maintained Outcome: Progressing   Problem: Elimination: Goal: Will not experience complications related to bowel motility Outcome: Progressing Goal: Will not experience complications related to urinary retention Outcome: Progressing   

## 2021-02-12 NOTE — Progress Notes (Addendum)
PROGRESS NOTE    Pamela [name]  NLG:[id number] DOB: 01-12-1956 DOA: 02/08/2021 PCP: Corwin Levins, MD  Brief Narrative:Pamela [name] is a 65 y.o. female with medical history significant for permanent atrial fibrillation on Eliquis, chronic diastolic CHF (EF 08-14%), CKD stage IIIa, T2DM, HTN, HLD, depression/anxiety presented to the ED for evaluation of right leg swelling. -Recently hospitalized 12/17/2020-12/21/2020 for lower GI bleeding related to diverticulosis and stercoral ulcer.  She was discharged to SNF.  Patient states she was sent home from her SNF on 02/07/2021 as her insurance would not pay for further days continues to have knee and hip problems with generalized debility.  Reportedly also did not get a prescription for Eliquis and had not taken it for last 3 days at -On 9/1 she noticed increased swelling and erythema to her right lower leg from above her ankle halfway up the shin.  She noticed a sore on the back of her leg that was having some clear drainage   Assessment & Plan:   Cellulitis of right lower extremity: Age indeterminant right leg DVT -Exam was consistent with cellulitis too, erythema warmth and tenderness improving considerably, continue IV Ancef, switch to oral Keflex tomorrow -Eliquis resumed -Keep leg elevated -Continue Lasix, dose increased to twice daily, creatinine is stable -PT OT eval completed, SNF recommended -Discharge planning   Permanent atrial fibrillation: -Continue Eliquis as above.  -continue diltiazem and Toprol-XL.   Chronic diastolic CHF: -Has chronic bilateral lower extremity edema,  -Given Lasix IV x1 on admission, continue p.o.,, dose increased -Add KCl  General debility Chronic bilateral knee pain, right hip pain -PT OT eval completed, SNF recommended -TOC consulted   CKD stage IIIa: Stable, continue to monitor.   Hypokalemia -Replaced   Type 2 diabetes: Hold oral hypoglycemics, continue sliding scale insulin  -Started on  glargine insulin   Hypertension: -Continue home diltiazem,Toprol-XL, Lasix. -Hold losartan   Diabetic neuropathy: Continue Lyrica.   DVT prophylaxis: Eliquis Code Status: DNR Family Communication: No family at bedside, discussed with patient in detail Disposition Plan:  Status is: Observation  The patient will require care spanning > 2 midnights and should be moved to inpatient because: Cellulitis requiring IV antibiotics  Dispo: The patient is from: SNF              Anticipated d/c is to: SNF              Patient currently is not medically stable to d/c.   Difficult to place patient No   Procedures:   Antimicrobials:    Subjective: -Feels better overall, pain and discomfort in her right leg is improving, breathing stable  Objective: Vitals:   02/11/21 1043 02/11/21 1945 02/12/21 0523 02/12/21 0832  BP: 136/72 139/63 124/69 111/60  Pulse: 95 96 96 98  Resp: 16 18 20 18   Temp: 97.9 F (36.6 C) 98.7 F (37.1 C) 97.7 F (36.5 C) 98.3 F (36.8 C)  TempSrc: Oral Oral Oral Oral  SpO2: 98% 95% 98% 98%  Weight:   (!) 154.5 kg   Height:        Intake/Output Summary (Last 24 hours) at 02/12/2021 1154 Last data filed at 02/12/2021 0837 Gross per 24 hour  Intake 1103 ml  Output 1750 ml  Net -647 ml   Filed Weights   02/10/21 0505 02/11/21 0038 02/12/21 0523  Weight: (!) 154.3 kg (!) 155.9 kg (!) 154.5 kg    Examination:  General exam: Obese pleasant female, sitting up in bed, AAOx3,  no distress CVS: S1-S2, regular rate rhythm Lungs: Decreased breath sounds to bases Abdomen: Soft, nontender, bowel sounds present Extremities: 1 -2 plus edema bilaterally, right lower leg erythema warmth and tenderness, small ulcer posteriorly with serosanguineous drainage  Skin: As above Psych: Appropriate mood and affect   Data Reviewed:   CBC: Recent Labs  Lab 02/08/21 1632 02/09/21 0448 02/10/21 0300 02/11/21 0146  WBC 12.8* 11.9* 12.9* 11.6*  NEUTROABS 10.5*  --   --    --   HGB 10.4* 9.9* 8.9* 9.0*  HCT 36.9 34.0* 30.4* 31.3*  MCV 88.1 87.2 85.9 86.2  PLT 487* 401* 387 369   Basic Metabolic Panel: Recent Labs  Lab 02/08/21 1632 02/09/21 0448 02/10/21 0300 02/11/21 0146 02/12/21 0410  NA 139 139 135 135 136  K 2.9* 2.7* 3.3* 3.7 3.5  CL 100 101 100 98 101  CO2 28 29 28 30 29   GLUCOSE 179* 194* 197* 212* 218*  BUN 12 10 12 13 13   CREATININE 1.07* 1.00 1.34* 1.42* 1.13*  CALCIUM 9.2 8.9 8.5* 9.0 8.8*  MG  --  2.0  --   --   --    GFR: Estimated Creatinine Clearance: 83.9 mL/min (A) (by C-G formula based on SCr of 1.13 mg/dL (H)). Liver Function Tests: Recent Labs  Lab 02/08/21 1632  AST 18  ALT 16  ALKPHOS 90  BILITOT 0.7  PROT 6.7  ALBUMIN 2.8*   No results for input(s): LIPASE, AMYLASE in the last 168 hours. No results for input(s): AMMONIA in the last 168 hours. Coagulation Profile: No results for input(s): INR, PROTIME in the last 168 hours. Cardiac Enzymes: No results for input(s): CKTOTAL, CKMB, CKMBINDEX, TROPONINI in the last 168 hours. BNP (last 3 results) Recent Labs    03/20/20 1145 06/21/20 1129  PROBNP 125.0* 139.0*   HbA1C: No results for input(s): HGBA1C in the last 72 hours. CBG: Recent Labs  Lab 02/11/21 0556 02/11/21 1102 02/11/21 1616 02/11/21 2115 02/12/21 0615  GLUCAP 199* 214* 223* 196* 196*   Lipid Profile: No results for input(s): CHOL, HDL, LDLCALC, TRIG, CHOLHDL, LDLDIRECT in the last 72 hours. Thyroid Function Tests: No results for input(s): TSH, T4TOTAL, FREET4, T3FREE, THYROIDAB in the last 72 hours. Anemia Panel: No results for input(s): VITAMINB12, FOLATE, FERRITIN, TIBC, IRON, RETICCTPCT in the last 72 hours. Urine analysis:    Component Value Date/Time   COLORURINE YELLOW 06/21/2020 1129   APPEARANCEUR CLEAR 06/21/2020 1129   LABSPEC 1.010 06/21/2020 1129   PHURINE 5.5 06/21/2020 1129   GLUCOSEU >=1000 (A) 06/21/2020 1129   HGBUR NEGATIVE 06/21/2020 1129   BILIRUBINUR  NEGATIVE 06/21/2020 1129   KETONESUR NEGATIVE 06/21/2020 1129   UROBILINOGEN 0.2 06/21/2020 1129   NITRITE NEGATIVE 06/21/2020 1129   LEUKOCYTESUR NEGATIVE 06/21/2020 1129   Sepsis Labs: @LABRCNTIP (procalcitonin:4,lacticidven:4)  ) Recent Results (from the past 240 hour(s))  Resp Panel by RT-PCR (Flu A&B, Covid) Nasopharyngeal Swab     Status: None   Collection Time: 02/08/21  9:20 PM   Specimen: Nasopharyngeal Swab; Nasopharyngeal(NP) swabs in vial transport medium  Result Value Ref Range Status   SARS Coronavirus 2 by RT PCR NEGATIVE NEGATIVE Final    Comment: (NOTE) SARS-CoV-2 target nucleic acids are NOT DETECTED.  The SARS-CoV-2 RNA is generally detectable in upper respiratory specimens during the acute phase of infection. The lowest concentration of SARS-CoV-2 viral copies this assay can detect is 138 copies/mL. A negative result does not preclude SARS-Cov-2 infection and should not be used as  the sole basis for treatment or other patient management decisions. A negative result may occur with  improper specimen collection/handling, submission of specimen other than nasopharyngeal swab, presence of viral mutation(s) within the areas targeted by this assay, and inadequate number of viral copies(<138 copies/mL). A negative result must be combined with clinical observations, patient history, and epidemiological information. The expected result is Negative.  Fact Sheet for Patients:  BloggerCourse.com  Fact Sheet for Healthcare Providers:  SeriousBroker.it  This test is no t yet approved or cleared by the [place] FDA and  has been authorized for detection and/or diagnosis of SARS-CoV-2 by FDA under an Emergency Use Authorization (EUA). This EUA will remain  in effect (meaning this test can be used) for the duration of the COVID-19 declaration under Section 564(b)(1) of the Act, 21 U.S.C.section 360bbb-3(b)(1), unless  the authorization is terminated  or revoked sooner.       Influenza A by PCR NEGATIVE NEGATIVE Final   Influenza B by PCR NEGATIVE NEGATIVE Final    Comment: (NOTE) The Xpert Xpress SARS-CoV-2/FLU/RSV plus assay is intended as an aid in the diagnosis of influenza from Nasopharyngeal swab specimens and should not be used as a sole basis for treatment. Nasal washings and aspirates are unacceptable for Xpert Xpress SARS-CoV-2/FLU/RSV testing.  Fact Sheet for Patients: BloggerCourse.com  Fact Sheet for Healthcare Providers: SeriousBroker.it  This test is not yet approved or cleared by the [place] FDA and has been authorized for detection and/or diagnosis of SARS-CoV-2 by FDA under an Emergency Use Authorization (EUA). This EUA will remain in effect (meaning this test can be used) for the duration of the COVID-19 declaration under Section 564(b)(1) of the Act, 21 U.S.C. section 360bbb-3(b)(1), unless the authorization is terminated or revoked.  Performed at [facility], [address], [place], [place] [address]      Scheduled Meds:  apixaban  5 mg Oral BID   atorvastatin  40 mg Oral Daily   diltiazem  360 mg Oral Daily   ferrous sulfate  325 mg Oral Daily   furosemide  40 mg Oral BID   Gerhardt's butt cream   Topical BID   insulin aspart  0-9 Units Subcutaneous TID WC   insulin glargine-yfgn  15 Units Subcutaneous Daily   metoprolol succinate  100 mg Oral Daily   potassium chloride  20 mEq Oral BID   pregabalin  50 mg Oral TID   sodium chloride flush  3 mL Intravenous Q12H   Continuous Infusions:   ceFAZolin (ANCEF) IV 2 g (02/12/21 0530)     LOS: 0 days    Time spent:  Zannie Cove, MD [facility]   02/12/2021, 11:54 AM

## 2021-02-12 NOTE — Progress Notes (Signed)
Inpatient Diabetes Program Recommendations  AACE/ADA: New Consensus Statement on Inpatient Glycemic Control (2015)  Target Ranges:  Prepandial:   less than 140 mg/dL      Peak postprandial:   less than 180 mg/dL (1-2 hours)      Critically ill patients:  140 - 180 mg/dL   Lab Results  Component Value Date   GLUCAP 196 (H) 02/12/2021   HGBA1C 6.9 (H) 11/18/2020    Review of Glycemic Control Results for Pamela [name], Pamela [name] (MRN [id number]) as of 02/12/2021 10:46  Ref. Range 02/11/2021 11:02 02/11/2021 16:16 02/11/2021 21:15 02/12/2021 06:15  Glucose-Capillary Latest Ref Range: 70 - 99 mg/dL 403 (H) 709 (H) 643 (H) 196 (H)   Diabetes history: Type 2 DM Outpatient Diabetes medications: Jardiance 25 mg QD, Actos 30 mg QD, Glipizide 10 mg QD Current orders for Inpatient glycemic control: Semglee 15 units QD, Novolog 0-9 units TID  Inpatient Diabetes Program Recommendations:    Consider increasing Semglee to 18 units QD.   Thanks, Lujean Rave, MSN, RNC-OB Diabetes Coordinator [phone] (8a-5p)

## 2021-02-13 DIAGNOSIS — L03115 Cellulitis of right lower limb: Secondary | ICD-10-CM | POA: Diagnosis not present

## 2021-02-13 LAB — BASIC METABOLIC PANEL
Anion gap: 7 (ref 5–15)
BUN: 11 mg/dL (ref 8–23)
CO2: 29 mmol/L (ref 22–32)
Calcium: 8.7 mg/dL — ABNORMAL LOW (ref 8.9–10.3)
Chloride: 98 mmol/L (ref 98–111)
Creatinine, Ser: 1.02 mg/dL — ABNORMAL HIGH (ref 0.44–1.00)
GFR, Estimated: >60 mL/min (ref >60)
Glucose, Bld: 240 mg/dL — ABNORMAL HIGH (ref 70–99)
Potassium: 3.3 mmol/L — ABNORMAL LOW (ref 3.5–5.1)
Sodium: 134 mmol/L — ABNORMAL LOW (ref 135–145)

## 2021-02-13 LAB — GLUCOSE, CAPILLARY
Glucose-Capillary: 200 mg/dL — ABNORMAL HIGH (ref 70–99)
Glucose-Capillary: 218 mg/dL — ABNORMAL HIGH (ref 70–99)
Glucose-Capillary: 230 mg/dL — ABNORMAL HIGH (ref 70–99)
Glucose-Capillary: 260 mg/dL — ABNORMAL HIGH (ref 70–99)

## 2021-02-13 LAB — CBC
HCT: 32.2 % — ABNORMAL LOW (ref 36.0–46.0)
Hemoglobin: 9.4 g/dL — ABNORMAL LOW (ref 12.0–15.0)
MCH: 25 pg — ABNORMAL LOW (ref 26.0–34.0)
MCHC: 29.2 g/dL — ABNORMAL LOW (ref 30.0–36.0)
MCV: 85.6 fL (ref 80.0–100.0)
Platelets: 309 10*3/uL (ref 150–400)
RBC: 3.76 MIL/uL — ABNORMAL LOW (ref 3.87–5.11)
RDW: 17.8 % — ABNORMAL HIGH (ref 11.5–15.5)
WBC: 10.3 10*3/uL (ref 4.0–10.5)
nRBC: 0 % (ref 0.0–0.2)

## 2021-02-13 MED ORDER — POTASSIUM CHLORIDE CRYS ER 20 MEQ PO TBCR
40.0000 meq | EXTENDED_RELEASE_TABLET | Freq: Two times a day (BID) | ORAL | Status: AC
  Administered 2021-02-13 – 2021-02-14 (×3): 40 meq via ORAL
  Filled 2021-02-13 (×3): qty 2

## 2021-02-13 MED ORDER — CEPHALEXIN 250 MG PO CAPS
250.0000 mg | ORAL_CAPSULE | Freq: Three times a day (TID) | ORAL | Status: DC
  Administered 2021-02-13 – 2021-02-16 (×9): 250 mg via ORAL
  Filled 2021-02-13 (×8): qty 1

## 2021-02-13 MED ORDER — INSULIN GLARGINE-YFGN 100 UNIT/ML ~~LOC~~ SOLN
25.0000 [IU] | Freq: Every day | SUBCUTANEOUS | Status: DC
  Administered 2021-02-13 – 2021-02-19 (×7): 25 [IU] via SUBCUTANEOUS
  Filled 2021-02-13 (×8): qty 0.25

## 2021-02-13 NOTE — Progress Notes (Signed)
PROGRESS NOTE    Pamela [name]  IWP:[id number] DOB: [birth year] DOA: 02/08/2021 PCP: Corwin Levins, MD  Brief Narrative:Pamela [name] is a 65 y.o. female with medical history significant for permanent atrial fibrillation on Eliquis, chronic diastolic CHF (EF 50-53%), CKD stage IIIa, T2DM, HTN, HLD, depression/anxiety presented to the ED for evaluation of right leg swelling. -Recently hospitalized 12/17/2020-12/21/2020 for lower GI bleeding related to diverticulosis and stercoral ulcer.  She was discharged to SNF.  Patient states she was sent home from her SNF on 02/07/2021 as her insurance would not pay for further days continues to have knee and hip problems with generalized debility.  Reportedly also did not get a prescription for Eliquis and had not taken it for last 3 days at -On 9/1 she noticed increased swelling and erythema to her right lower leg from above her ankle halfway up the shin.  She noticed a sore on the back of her leg that was having some clear drainage   Assessment & Plan:   Cellulitis of right lower extremity: Age indeterminant right leg DVT -Exam was consistent with cellulitis too, erythema warmth and tenderness improving considerably, improving on IV Ancef, will switch to oral Keflex to complete 5 to 7-day course -Eliquis resumed -Keep leg elevated -Continue Lasix, dose increased to twice daily, creatinine is stable -PT OT eval completed, SNF recommended rehabilitation -Discharge planning, TOC following   Permanent atrial fibrillation: -Continue Eliquis as above.  -continue diltiazem and Toprol-XL.   Chronic diastolic CHF: -Has chronic bilateral lower extremity edema,  -Given Lasix IV x1 on admission, continue p.o.,, dose increased -Add KCl, BMP in a.m.  General debility Chronic bilateral knee pain, right hip pain -PT OT eval completed, SNF recommended -TOC following    CKD stage IIIa: Stable, continue to monitor.   Hypokalemia -Replaced   Type 2  diabetes: Hold oral hypoglycemics, continue sliding scale insulin  -Started on glargine insulin, CBGs poorly controlled, will increase dose   Hypertension: -Continue home diltiazem,Toprol-XL, Lasix. -Hold losartan   Diabetic neuropathy: Continue Lyrica.   DVT prophylaxis: Eliquis Code Status: DNR Family Communication: No family at bedside, discussed with patient in detail Disposition Plan:  Status is: Inpatient appropriate due to severity of illness  Dispo: The patient is from: SNF              Anticipated d/c is to: SNF              Patient currently is medically stable to d/c.   Difficult to place patient No   Procedures:   Antimicrobials: IV Ancef till 9/6, p.o. Keflex from 9/7   Subjective: -Feels better overall, breathing has improved as well, worked with physical therapy yesterday  Objective: Vitals:   02/12/21 2033 02/13/21 0146 02/13/21 0413 02/13/21 0827  BP: 100/67  104/65 104/68  Pulse: 70  (!) 105 79  Resp: 18  20 20   Temp: 98.6 F (37 C)  (!) 97.4 F (36.3 C) 98.8 F (37.1 C)  TempSrc: Oral  Oral Oral  SpO2: 94%  93% 93%  Weight:  (!) 154.4 kg    Height:        Intake/Output Summary (Last 24 hours) at 02/13/2021 1049 Last data filed at 02/13/2021 0803 Gross per 24 hour  Intake 1320 ml  Output 2275 ml  Net -955 ml   Filed Weights   02/11/21 0038 02/12/21 0523 02/13/21 0146  Weight: (!) 155.9 kg (!) 154.5 kg (!) 154.4 kg    Examination:  General  exam: Obese pleasant female, sitting up in bed, AAOx3, no distress CVS: S1-S2, regular rate rhythm Lungs: Decreased breath sounds to bases Abdomen: Soft, nontender, bowel sounds present Extremities: 1  plus edema bilaterally, right lower leg erythema warmth and tenderness have improved considerably, small ulcer posteriorly with serosanguineous drainage  Skin: As above Psych: Appropriate mood and affect   Data Reviewed:   CBC: Recent Labs  Lab 02/08/21 1632 02/09/21 0448 02/10/21 0300  02/11/21 0146 02/13/21 0142  WBC 12.8* 11.9* 12.9* 11.6* 10.3  NEUTROABS 10.5*  --   --   --   --   HGB 10.4* 9.9* 8.9* 9.0* 9.4*  HCT 36.9 34.0* 30.4* 31.3* 32.2*  MCV 88.1 87.2 85.9 86.2 85.6  PLT 487* 401* 387 369 309   Basic Metabolic Panel: Recent Labs  Lab 02/09/21 0448 02/10/21 0300 02/11/21 0146 02/12/21 0410 02/13/21 0142  NA 139 135 135 136 134*  K 2.7* 3.3* 3.7 3.5 3.3*  CL 101 100 98 101 98  CO2 29 28 30 29 29   GLUCOSE 194* 197* 212* 218* 240*  BUN 10 12 13 13 11   CREATININE 1.00 1.34* 1.42* 1.13* 1.02*  CALCIUM 8.9 8.5* 9.0 8.8* 8.7*  MG 2.0  --   --   --   --    GFR: Estimated Creatinine Clearance: 92.9 mL/min (A) (by C-G formula based on SCr of 1.02 mg/dL (H)). Liver Function Tests: Recent Labs  Lab 02/08/21 1632  AST 18  ALT 16  ALKPHOS 90  BILITOT 0.7  PROT 6.7  ALBUMIN 2.8*   No results for input(s): LIPASE, AMYLASE in the last 168 hours. No results for input(s): AMMONIA in the last 168 hours. Coagulation Profile: No results for input(s): INR, PROTIME in the last 168 hours. Cardiac Enzymes: No results for input(s): CKTOTAL, CKMB, CKMBINDEX, TROPONINI in the last 168 hours. BNP (last 3 results) Recent Labs    03/20/20 1145 06/21/20 1129  PROBNP 125.0* 139.0*   HbA1C: No results for input(s): HGBA1C in the last 72 hours. CBG: Recent Labs  Lab 02/12/21 0615 02/12/21 1227 02/12/21 1641 02/12/21 2038 02/13/21 0602  GLUCAP 196* 242* 260* 186* 200*   Lipid Profile: No results for input(s): CHOL, HDL, LDLCALC, TRIG, CHOLHDL, LDLDIRECT in the last 72 hours. Thyroid Function Tests: No results for input(s): TSH, T4TOTAL, FREET4, T3FREE, THYROIDAB in the last 72 hours. Anemia Panel: No results for input(s): VITAMINB12, FOLATE, FERRITIN, TIBC, IRON, RETICCTPCT in the last 72 hours. Urine analysis:    Component Value Date/Time   COLORURINE YELLOW 06/21/2020 1129   APPEARANCEUR CLEAR 06/21/2020 1129   LABSPEC 1.010 06/21/2020 1129    PHURINE 5.5 06/21/2020 1129   GLUCOSEU >=1000 (A) 06/21/2020 1129   HGBUR NEGATIVE 06/21/2020 1129   BILIRUBINUR NEGATIVE 06/21/2020 1129   KETONESUR NEGATIVE 06/21/2020 1129   UROBILINOGEN 0.2 06/21/2020 1129   NITRITE NEGATIVE 06/21/2020 1129   LEUKOCYTESUR NEGATIVE 06/21/2020 1129   Sepsis Labs: @LABRCNTIP (procalcitonin:4,lacticidven:4)  ) Recent Results (from the past 240 hour(s))  Resp Panel by RT-PCR (Flu A&B, Covid) Nasopharyngeal Swab     Status: None   Collection Time: 02/08/21  9:20 PM   Specimen: Nasopharyngeal Swab; Nasopharyngeal(NP) swabs in vial transport medium  Result Value Ref Range Status   SARS Coronavirus 2 by RT PCR NEGATIVE NEGATIVE Final    Comment: (NOTE) SARS-CoV-2 target nucleic acids are NOT DETECTED.  The SARS-CoV-2 RNA is generally detectable in upper respiratory specimens during the acute phase of infection. The lowest concentration of SARS-CoV-2 viral  copies this assay can detect is 138 copies/mL. A negative result does not preclude SARS-Cov-2 infection and should not be used as the sole basis for treatment or other patient management decisions. A negative result may occur with  improper specimen collection/handling, submission of specimen other than nasopharyngeal swab, presence of viral mutation(s) within the areas targeted by this assay, and inadequate number of viral copies(<138 copies/mL). A negative result must be combined with clinical observations, patient history, and epidemiological information. The expected result is Negative.  Fact Sheet for Patients:  BloggerCourse.com  Fact Sheet for Healthcare Providers:  SeriousBroker.it  This test is no t yet approved or cleared by the [place] FDA and  has been authorized for detection and/or diagnosis of SARS-CoV-2 by FDA under an Emergency Use Authorization (EUA). This EUA will remain  in effect (meaning this test can be used) for the  duration of the COVID-19 declaration under Section 564(b)(1) of the Act, 21 U.S.C.section 360bbb-3(b)(1), unless the authorization is terminated  or revoked sooner.       Influenza A by PCR NEGATIVE NEGATIVE Final   Influenza B by PCR NEGATIVE NEGATIVE Final    Comment: (NOTE) The Xpert Xpress SARS-CoV-2/FLU/RSV plus assay is intended as an aid in the diagnosis of influenza from Nasopharyngeal swab specimens and should not be used as a sole basis for treatment. Nasal washings and aspirates are unacceptable for Xpert Xpress SARS-CoV-2/FLU/RSV testing.  Fact Sheet for Patients: BloggerCourse.com  Fact Sheet for Healthcare Providers: SeriousBroker.it  This test is not yet approved or cleared by the [place] FDA and has been authorized for detection and/or diagnosis of SARS-CoV-2 by FDA under an Emergency Use Authorization (EUA). This EUA will remain in effect (meaning this test can be used) for the duration of the COVID-19 declaration under Section 564(b)(1) of the Act, 21 U.S.C. section 360bbb-3(b)(1), unless the authorization is terminated or revoked.  Performed at [facility], [address] [place], [place] [address]      Scheduled Meds:  apixaban  5 mg Oral BID   atorvastatin  40 mg Oral Daily   cephALEXin  250 mg Oral Q8H   diltiazem  360 mg Oral Daily   ferrous sulfate  325 mg Oral Daily   furosemide  40 mg Oral BID   Gerhardt's butt cream   Topical BID   insulin aspart  0-9 Units Subcutaneous TID WC   insulin glargine-yfgn  25 Units Subcutaneous Daily   metoprolol succinate  100 mg Oral Daily   potassium chloride  40 mEq Oral BID   pregabalin  50 mg Oral TID   sodium chloride flush  3 mL Intravenous Q12H   Continuous Infusions:     LOS: 1 day    Time spent:  Zannie Cove, MD [facility]   02/13/2021, 10:49 AM

## 2021-02-13 NOTE — Plan of Care (Signed)

## 2021-02-13 NOTE — TOC Progression Note (Signed)
Transition of Care [facility]) - Progression Note    Patient Details  Name: Pamela [name] MRN: [id number] Date of Birth: 1955/10/07  [facility]) CM/SW Contact  Faraz Ponciano [place], [place] Phone Number: 02/13/2021, 2:04 PM  Clinical Narrative:     CSW received call from [facility] that pt is out of SNF days. Her insurance covers 60 days and pt has used them all already. This CSW notified the regular unit CSW.   Expected Discharge Plan: Skilled Nursing Facility Barriers to Discharge: Continued Medical Work up  Expected Discharge Plan and Services Expected Discharge Plan: Skilled Nursing Facility In-house Referral: Clinical Social Work     Living arrangements for the past 2 months: Single Family Home                                       Social Determinants of Health (SDOH) Interventions    Readmission Risk Interventions No flowsheet data found.

## 2021-02-13 NOTE — TOC Progression Note (Addendum)
Transition of Care [facility]) - Progression Note    Patient Details  Name: Pamela [name] MRN: [id number] Date of Birth: 12-18-1955  Transition of Care [facility]) CM/SW Contact  Leone Haven, RN Phone Number: 02/13/2021, 4:08 PM  Clinical Narrative:    This NCM received call from Lupita Leash with [employer] stating that patient is out of SNF days, she would need to pay privately or go home.  Her inpatient hospital stay has been approved thru 02/28/21. Patient states she signed up for Medicare on 8/30 or 8/31 , and she turns 65 this month.  This NCM will check with financial counselor in the am to see if she has a Medicare number.   Expected Discharge Plan: Skilled Nursing Facility Barriers to Discharge: Continued Medical Work up  Expected Discharge Plan and Services Expected Discharge Plan: Skilled Nursing Facility In-house Referral: Clinical Social Work     Living arrangements for the past 2 months: Single Family Home                                       Social Determinants of Health (SDOH) Interventions    Readmission Risk Interventions No flowsheet data found.

## 2021-02-14 DIAGNOSIS — L03115 Cellulitis of right lower limb: Secondary | ICD-10-CM | POA: Diagnosis not present

## 2021-02-14 LAB — CBC
HCT: 33 % — ABNORMAL LOW (ref 36.0–46.0)
Hemoglobin: 9.8 g/dL — ABNORMAL LOW (ref 12.0–15.0)
MCH: 25.3 pg — ABNORMAL LOW (ref 26.0–34.0)
MCHC: 29.7 g/dL — ABNORMAL LOW (ref 30.0–36.0)
MCV: 85.3 fL (ref 80.0–100.0)
Platelets: 286 10*3/uL (ref 150–400)
RBC: 3.87 MIL/uL (ref 3.87–5.11)
RDW: 17.9 % — ABNORMAL HIGH (ref 11.5–15.5)
WBC: 11.1 10*3/uL — ABNORMAL HIGH (ref 4.0–10.5)
nRBC: 0 % (ref 0.0–0.2)

## 2021-02-14 LAB — GLUCOSE, CAPILLARY
Glucose-Capillary: 210 mg/dL — ABNORMAL HIGH (ref 70–99)
Glucose-Capillary: 217 mg/dL — ABNORMAL HIGH (ref 70–99)
Glucose-Capillary: 271 mg/dL — ABNORMAL HIGH (ref 70–99)
Glucose-Capillary: 218 mg/dL — ABNORMAL HIGH (ref 70–99)

## 2021-02-14 LAB — BASIC METABOLIC PANEL
Anion gap: 9 (ref 5–15)
BUN: 12 mg/dL (ref 8–23)
CO2: 30 mmol/L (ref 22–32)
Calcium: 9.2 mg/dL (ref 8.9–10.3)
Chloride: 97 mmol/L — ABNORMAL LOW (ref 98–111)
Creatinine, Ser: 1.11 mg/dL — ABNORMAL HIGH (ref 0.44–1.00)
GFR, Estimated: 56 mL/min — ABNORMAL LOW (ref >60)
Glucose, Bld: 221 mg/dL — ABNORMAL HIGH (ref 70–99)
Potassium: 3.4 mmol/L — ABNORMAL LOW (ref 3.5–5.1)
Sodium: 136 mmol/L (ref 135–145)

## 2021-02-14 MED ORDER — POTASSIUM CHLORIDE CRYS ER 20 MEQ PO TBCR
40.0000 meq | EXTENDED_RELEASE_TABLET | Freq: Every day | ORAL | Status: DC
  Administered 2021-02-14 – 2021-02-28 (×15): 40 meq via ORAL
  Filled 2021-02-14 (×15): qty 2

## 2021-02-14 MED ORDER — INFLUENZA VAC SPLIT QUAD 0.5 ML IM SUSY
0.5000 mL | PREFILLED_SYRINGE | INTRAMUSCULAR | Status: AC
  Administered 2021-02-16: 0.5 mL via INTRAMUSCULAR
  Filled 2021-02-14 (×2): qty 0.5

## 2021-02-14 MED ORDER — FERROUS SULFATE 325 (65 FE) MG PO TABS
325.0000 mg | ORAL_TABLET | ORAL | Status: DC
  Administered 2021-02-14 – 2021-03-18 (×33): 325 mg via ORAL
  Filled 2021-02-14 (×32): qty 1

## 2021-02-14 MED ORDER — DOXYCYCLINE HYCLATE 100 MG PO TABS
100.0000 mg | ORAL_TABLET | Freq: Two times a day (BID) | ORAL | Status: AC
  Administered 2021-02-14 – 2021-02-18 (×10): 100 mg via ORAL
  Filled 2021-02-14 (×10): qty 1

## 2021-02-14 MED ORDER — INSULIN ASPART 100 UNIT/ML IJ SOLN
3.0000 [IU] | Freq: Three times a day (TID) | INTRAMUSCULAR | Status: DC
  Administered 2021-02-15 – 2021-03-18 (×92): 3 [IU] via SUBCUTANEOUS

## 2021-02-14 NOTE — Progress Notes (Addendum)
PROGRESS NOTE    Pamela [name]  TZG:[id number] DOB: Jul 04, 1955 DOA: 02/08/2021 PCP: Corwin Levins, MD   Brief Narrative: 65 year old with past medical history significant for permanent A. fib on Eliquis, chronic diastolic heart failure, CKD stage IIIa, diabetes type 2, hypertension, hyperlipidemia, depression, anxiety presented to the ED for evaluation of right leg swelling. Recently hospitalized 12/17/2020 until 12/21/2020 for lower GI bleed related to diverticulosis and stercoral ulcer.  She was discharged to SNF.  Patient was sent home from skilled nursing facility on 02/07/2021 as her insurance will not pay for further days, patient continued to have knee and hip problems with generalized debility.  Also she did not get a prescription for Eliquis and had not taken it for 3 days.  On 9/1 she noticed increasing swelling and erythema to her right lower leg and presented for evaluation.   Assessment & Plan:   Principal Problem:   Cellulitis of right lower extremity Active Problems:   Type 2 diabetes mellitus (HCC)   Hypertension associated with diabetes (HCC)   CKD (chronic kidney disease) stage 3, GFR 30-59 ml/min (HCC)   Chronic atrial fibrillation (HCC)   Chronic diastolic heart failure (HCC)   Hyperlipidemia associated with type 2 diabetes mellitus (HCC)   Pressure injury of skin  1-Cellulitis of the right lower extremity age indeterminate right leg DVT: -Doppler: age indeterminate right popliteal vein.  -Patient initially improved on Ancef she was transitioned to Keflex on 9/7.  She continued to have some redness and drainage.  I will add doxycycline. -Continue with Eliquis. -Continue with Laxis- -She will benefit from skilled rehab   2-Permanent A. fib: Continue with Eliquis Continue with diltiazem and Toprol  3-General debility, chronic bilateral knee pain, right hip pain: Continue with PT. From a skilled rehab  4-CKD stage IIIa: Stable monitor  Hypokalemia replete  orally  Diabetic neuropathy: Continue with Lyrica Type II Diabetes: Continue with gargling and sliding scale insulin. Will add meals coverage.        Pressure Injury 02/09/21 Buttocks Right Stage 2 -  Partial thickness loss of dermis presenting as a shallow open injury with a red, pink wound bed without slough. (Active)  02/09/21 2059  Location: Buttocks  Location Orientation: Right  Staging: Stage 2 -  Partial thickness loss of dermis presenting as a shallow open injury with a red, pink wound bed without slough.  Wound Description (Comments):   Present on Admission: Yes     Estimated body mass index is 45.96 kg/m as calculated from the following:   Height as of this encounter: 6' (1.829 m).   Weight as of this encounter: 153.7 kg.   DVT prophylaxis: Eliquis Code Status: DNR Family Communication: Care discussed with patient.  Disposition Plan:  Status is: Inpatient  Remains inpatient appropriate because:IV treatments appropriate due to intensity of illness or inability to take PO and Inpatient level of care appropriate due to severity of illness  Dispo: The patient is from: Home              Anticipated d/c is to: SNF              Patient currently is medically stable to d/c.   Difficult to place patient Yes        Consultants:  None  Procedures:    Antimicrobials:  Keflex, added Doxy  Subjective: Patient related she never had leg pain, it was more redness and swelling.  Redness has improved slightly, swelling has also improved.  Still feels that she needs to go to a rehab.  Objective: Vitals:   02/14/21 0000 02/14/21 0431 02/14/21 0856 02/14/21 1215  BP:  120/66 108/60 120/71  Pulse:  89  92  Resp:  18  19  Temp:  97.7 F (36.5 C)  98 F (36.7 C)  TempSrc:  Oral  Oral  SpO2:  92%  93%  Weight: (!) 153.7 kg     Height:        Intake/Output Summary (Last 24 hours) at 02/14/2021 1612 Last data filed at 02/14/2021 1353 Gross per 24 hour  Intake 460  ml  Output 2000 ml  Net -1540 ml   Filed Weights   02/12/21 0523 02/13/21 0146 02/14/21 0000  Weight: (!) 154.5 kg (!) 154.4 kg (!) 153.7 kg    Examination:  General exam: Appears calm and comfortable  Respiratory system: Clear to auscultation. Respiratory effort normal. Cardiovascular system: S1 & S2 heard, RRR. No JVD, murmurs, rubs, gallops or clicks. No pedal edema. Gastrointestinal system: Abdomen is nondistended, soft and nontender. No organomegaly or masses felt. Normal bowel sounds heard. Central nervous system: Alert and oriented.  Extremities: Right lower extremity with redness, mild drainage from one of the blister   Data Reviewed: I have personally reviewed following labs and imaging studies  CBC: Recent Labs  Lab 02/08/21 1632 02/09/21 0448 02/10/21 0300 02/11/21 0146 02/13/21 0142 02/14/21 0307  WBC 12.8* 11.9* 12.9* 11.6* 10.3 11.1*  NEUTROABS 10.5*  --   --   --   --   --   HGB 10.4* 9.9* 8.9* 9.0* 9.4* 9.8*  HCT 36.9 34.0* 30.4* 31.3* 32.2* 33.0*  MCV 88.1 87.2 85.9 86.2 85.6 85.3  PLT 487* 401* 387 369 309 286   Basic Metabolic Panel: Recent Labs  Lab 02/09/21 0448 02/10/21 0300 02/11/21 0146 02/12/21 0410 02/13/21 0142 02/14/21 0307  NA 139 135 135 136 134* 136  K 2.7* 3.3* 3.7 3.5 3.3* 3.4*  CL 101 100 98 101 98 97*  CO2 29 28 30 29 29 30   GLUCOSE 194* 197* 212* 218* 240* 221*  BUN 10 12 13 13 11 12   CREATININE 1.00 1.34* 1.42* 1.13* 1.02* 1.11*  CALCIUM 8.9 8.5* 9.0 8.8* 8.7* 9.2  MG 2.0  --   --   --   --   --    GFR: Estimated Creatinine Clearance: 85.1 mL/min (A) (by C-G formula based on SCr of 1.11 mg/dL (H)). Liver Function Tests: Recent Labs  Lab 02/08/21 1632  AST 18  ALT 16  ALKPHOS 90  BILITOT 0.7  PROT 6.7  ALBUMIN 2.8*   No results for input(s): LIPASE, AMYLASE in the last 168 hours. No results for input(s): AMMONIA in the last 168 hours. Coagulation Profile: No results for input(s): INR, PROTIME in the last 168  hours. Cardiac Enzymes: No results for input(s): CKTOTAL, CKMB, CKMBINDEX, TROPONINI in the last 168 hours. BNP (last 3 results) Recent Labs    03/20/20 1145 06/21/20 1129  PROBNP 125.0* 139.0*   HbA1C: No results for input(s): HGBA1C in the last 72 hours. CBG: Recent Labs  Lab 02/13/21 1616 02/13/21 2113 02/14/21 0558 02/14/21 1149 02/14/21 1609  GLUCAP 230* 260* 210* 217* 271*   Lipid Profile: No results for input(s): CHOL, HDL, LDLCALC, TRIG, CHOLHDL, LDLDIRECT in the last 72 hours. Thyroid Function Tests: No results for input(s): TSH, T4TOTAL, FREET4, T3FREE, THYROIDAB in the last 72 hours. Anemia Panel: No results for input(s): VITAMINB12, FOLATE, FERRITIN, TIBC, IRON, RETICCTPCT  in the last 72 hours. Sepsis Labs: No results for input(s): PROCALCITON, LATICACIDVEN in the last 168 hours.  Recent Results (from the past 240 hour(s))  Resp Panel by RT-PCR (Flu A&B, Covid) Nasopharyngeal Swab     Status: None   Collection Time: 02/08/21  9:20 PM   Specimen: Nasopharyngeal Swab; Nasopharyngeal(NP) swabs in vial transport medium  Result Value Ref Range Status   SARS Coronavirus 2 by RT PCR NEGATIVE NEGATIVE Final    Comment: (NOTE) SARS-CoV-2 target nucleic acids are NOT DETECTED.  The SARS-CoV-2 RNA is generally detectable in upper respiratory specimens during the acute phase of infection. The lowest concentration of SARS-CoV-2 viral copies this assay can detect is 138 copies/mL. A negative result does not preclude SARS-Cov-2 infection and should not be used as the sole basis for treatment or other patient management decisions. A negative result may occur with  improper specimen collection/handling, submission of specimen other than nasopharyngeal swab, presence of viral mutation(s) within the areas targeted by this assay, and inadequate number of viral copies(<138 copies/mL). A negative result must be combined with clinical observations, patient history, and  epidemiological information. The expected result is Negative.  Fact Sheet for Patients:  BloggerCourse.com  Fact Sheet for Healthcare Providers:  SeriousBroker.it  This test is no t yet approved or cleared by the [place] FDA and  has been authorized for detection and/or diagnosis of SARS-CoV-2 by FDA under an Emergency Use Authorization (EUA). This EUA will remain  in effect (meaning this test can be used) for the duration of the COVID-19 declaration under Section 564(b)(1) of the Act, 21 U.S.C.section 360bbb-3(b)(1), unless the authorization is terminated  or revoked sooner.       Influenza A by PCR NEGATIVE NEGATIVE Final   Influenza B by PCR NEGATIVE NEGATIVE Final    Comment: (NOTE) The Xpert Xpress SARS-CoV-2/FLU/RSV plus assay is intended as an aid in the diagnosis of influenza from Nasopharyngeal swab specimens and should not be used as a sole basis for treatment. Nasal washings and aspirates are unacceptable for Xpert Xpress SARS-CoV-2/FLU/RSV testing.  Fact Sheet for Patients: BloggerCourse.com  Fact Sheet for Healthcare Providers: SeriousBroker.it  This test is not yet approved or cleared by the [place] FDA and has been authorized for detection and/or diagnosis of SARS-CoV-2 by FDA under an Emergency Use Authorization (EUA). This EUA will remain in effect (meaning this test can be used) for the duration of the COVID-19 declaration under Section 564(b)(1) of the Act, 21 U.S.C. section 360bbb-3(b)(1), unless the authorization is terminated or revoked.  Performed at [facility], [address] [place], [place] [address]          Radiology Studies: No results found.      Scheduled Meds:  apixaban  5 mg Oral BID   atorvastatin  40 mg Oral Daily   cephALEXin  250 mg Oral Q8H   diltiazem  360 mg Oral Daily   doxycycline  100 mg Oral  Q12H   ferrous sulfate  325 mg Oral Q24H   furosemide  40 mg Oral BID   Gerhardt's butt cream   Topical BID   insulin aspart  0-9 Units Subcutaneous TID WC   insulin glargine-yfgn  25 Units Subcutaneous Daily   metoprolol succinate  100 mg Oral Daily   pregabalin  50 mg Oral TID   sodium chloride flush  3 mL Intravenous Q12H   Continuous Infusions:   LOS: 2 days    Time spent: 35 minutes.  Alba CoryBelkys A Abron Neddo, MD [facility]   If 7PM-7AM, please contact night-coverage [email or web address]  02/14/2021, 4:12 PM

## 2021-02-14 NOTE — Progress Notes (Signed)
Physical Therapy Treatment Patient Details Name: Pamela [name] MRN: [id number] DOB: 22-Nov-1955 Today's Date: 02/14/2021    History of Present Illness Pt is a 65 y.o. female who presented 02/08/21 with R leg swelling and erythema. Per chart: "Patient was hospitalized 12/17/2020-12/21/2020 for lower GI bleeding related to diverticulosis and stercoral ulcer" and then discharged to SNF then home. Chest x-ray shows cardiomegaly with vascular congestion. Pt admitted with R lower extremiity cellulitis and age indeterminant R leg DVT. PMH: anxiety, permanent a-fib, chronic diastolic CHF, depression, DM2, diastolic dysfunction, HTN, migraines, R knee DJD, rosacea.    PT Comments    Focus of session working on pre-standing trying to lift hips off bed. Pt limited in safe mobility by decreased LE strength. Pt is supervision for bed mobility, and min guard for lateral scoot transfers. Pt not able to lift hips off bed with use of recliner. However, technique is improving. D/c plans remain appropriately. PT will continue to follow acutely.   Follow Up Recommendations  SNF;Supervision for mobility/OOB     Equipment Recommendations  Rolling walker with 5" wheels;3in1 (PT);Wheelchair (measurements PT);Wheelchair cushion (measurements PT);Hospital bed;Other (comment) (drop arm for 3in1 and w/c; sliding board; bariatric for all equipment)       Precautions / Restrictions Precautions Precautions: Fall Precaution Comments: DVT R leg Restrictions Weight Bearing Restrictions: No    Mobility  Bed Mobility Overal bed mobility: Needs Assistance   Rolling: Supervision   Supine to sit: HOB elevated;Supervision Sit to supine: Supervision   General bed mobility comments: pt only requires supervision for bed mobility    Transfers Overall transfer level: Needs assistance Equipment used:  (back of recliner) Transfers: Lateral/Scoot Transfers;Sit to/from Stand Sit to Stand: Total assist;From elevated surface  (x6)        Lateral/Scoot Transfers: Min guard General transfer comment: utilized back of the recliner to attempt to pull into standing x6, almost able to clear hips, able to scoot to the HoB with min guard and improved hip offweighting to accomplish.  Ambulation/Gait             General Gait Details: unable       Balance Overall balance assessment: Needs assistance Sitting-balance support: No upper extremity supported;Feet supported Sitting balance-[name]: Good                                      Cognition Arousal/Alertness: Awake/alert Behavior During Therapy: WFL for tasks assessed/performed Overall Cognitive Status: Within Functional Limits for tasks assessed                                           General Comments General comments (skin integrity, edema, etc.): Left pt bed in chair postion to improve tolerance to upright, and challenge core muscles.      Pertinent Vitals/Pain Pain Assessment: No/denies pain     PT Goals (current goals can now be found in the care plan section) Acute Rehab PT Goals Patient Stated Goal: to get stronger PT Goal Formulation: With patient Time For Goal Achievement: 02/23/21 Potential to Achieve Goals: Fair Progress towards PT goals: Progressing toward goals    Frequency    Min 2X/week      PT Plan Current plan remains appropriate    Co-evaluation PT/OT/SLP Co-Evaluation/Treatment: Yes  AM-PAC PT "6 Clicks" Mobility   Outcome Measure  Help needed turning from your back to your side while in a flat bed without using bedrails?: None Help needed moving from lying on your back to sitting on the side of a flat bed without using bedrails?: A Little Help needed moving to and from a bed to a chair (including a wheelchair)?: Total Help needed standing up from a chair using your arms (e.g., wheelchair or bedside chair)?: Total Help needed to walk in hospital room?:  Total Help needed climbing 3-5 steps with a railing? : Total 6 Click Score: 11    End of Session Equipment Utilized During Treatment: Gait belt Activity Tolerance: Patient tolerated treatment well Patient left: in bed;with call bell/phone within reach;with bed alarm set Nurse Communication: Mobility status PT Visit Diagnosis: Unsteadiness on feet (R26.81);Muscle weakness (generalized) (M62.81);Difficulty in walking, not elsewhere classified (R26.2);Pain Pain - Right/Left: Right Pain - part of body: Leg     Time: 1610-9604 PT Time Calculation (min) (ACUTE ONLY): 36 min  Charges:  $Therapeutic Exercise: 8-22 mins $Therapeutic Activity: 8-22 mins                     Yaniel Limbaugh B. Beverely Risen PT, DPT Acute Rehabilitation Services Pager [phone] Office [phone]    Elon Alas Fleet 02/14/2021, 3:29 PM

## 2021-02-14 NOTE — Progress Notes (Signed)
Inpatient Diabetes Program Recommendations  AACE/ADA: New Consensus Statement on Inpatient Glycemic Control (2015)  Target Ranges:  Prepandial:   less than 140 mg/dL      Peak postprandial:   less than 180 mg/dL (1-2 hours)      Critically ill patients:  140 - 180 mg/dL   Lab Results  Component Value Date   GLUCAP 210 (H) 02/14/2021   HGBA1C 6.9 (H) 11/18/2020    Review of Glycemic Control Results for Pamela [name], Pamela [name] (MRN [id number]) as of 02/14/2021 09:22  Ref. Range 02/13/2021 06:02 02/13/2021 11:22 02/13/2021 16:16 02/13/2021 21:13 02/14/2021 05:58  Glucose-Capillary Latest Ref Range: 70 - 99 mg/dL 520 (H) 802 (H) 233 (H) 260 (H) 210 (H)   Diabetes history: Type 2 DM Outpatient Diabetes medications: Jardiance 25 mg QD, Actos 30 mg QD, Glipizide 10 mg QD Current orders for Inpatient glycemic control: Semglee 25 units QD, Novolog 0-9 units TID   Inpatient Diabetes Program Recommendations:     Consider increasing Semglee to 30 units QD.   Thanks, Lujean Rave, MSN, RNC-OB Diabetes Coordinator [phone] (8a-5p)

## 2021-02-14 NOTE — Plan of Care (Signed)
  Problem: Education: Goal: Knowledge of General Education information will improve Description Including pain rating scale, medication(s)/side effects and non-pharmacologic comfort measures Outcome: Progressing   Problem: Health Behavior/Discharge Planning: Goal: Ability to manage health-related needs will improve Outcome: Progressing   

## 2021-02-15 DIAGNOSIS — L03115 Cellulitis of right lower limb: Secondary | ICD-10-CM | POA: Diagnosis not present

## 2021-02-15 LAB — BASIC METABOLIC PANEL
Anion gap: 7 (ref 5–15)
BUN: 12 mg/dL (ref 8–23)
CO2: 28 mmol/L (ref 22–32)
Calcium: 8.6 mg/dL — ABNORMAL LOW (ref 8.9–10.3)
Chloride: 98 mmol/L (ref 98–111)
Creatinine, Ser: 1 mg/dL (ref 0.44–1.00)
GFR, Estimated: >60 mL/min (ref >60)
Glucose, Bld: 166 mg/dL — ABNORMAL HIGH (ref 70–99)
Potassium: 3.5 mmol/L (ref 3.5–5.1)
Sodium: 133 mmol/L — ABNORMAL LOW (ref 135–145)

## 2021-02-15 LAB — GLUCOSE, CAPILLARY
Glucose-Capillary: 185 mg/dL — ABNORMAL HIGH (ref 70–99)
Glucose-Capillary: 175 mg/dL — ABNORMAL HIGH (ref 70–99)
Glucose-Capillary: 193 mg/dL — ABNORMAL HIGH (ref 70–99)
Glucose-Capillary: 214 mg/dL — ABNORMAL HIGH (ref 70–99)

## 2021-02-15 MED ORDER — NYSTATIN 100000 UNIT/GM EX POWD: Freq: Three times a day (TID) | CUTANEOUS | Status: DC

## 2021-02-15 MED ORDER — NYSTATIN 100000 UNIT/GM EX POWD
CUTANEOUS | Status: DC | PRN
  Administered 2021-03-17: 1 via TOPICAL
  Filled 2021-02-15: qty 15

## 2021-02-15 NOTE — Evaluation (Signed)
Occupational Therapy Evaluation Patient Details Name: Pamela [name] MRN: [id number] DOB: 1955-08-07 Today's Date: 02/15/2021    History of Present Illness Pt is a 65 y.o. female who presented 02/08/21 with R leg swelling and erythema. Per chart: "Patient was hospitalized 12/17/2020-12/21/2020 for lower GI bleeding related to diverticulosis and stercoral ulcer" and then discharged to SNF then home. Chest x-ray shows cardiomegaly with vascular congestion. Pt admitted with R lower extremiity cellulitis and age indeterminant R leg DVT. PMH: anxiety, permanent a-fib, chronic diastolic CHF, depression, DM2, diastolic dysfunction, HTN, migraines, R knee DJD, rosacea.   Clinical Impression   Pt is modified independent for bed mobility. She sat EOB and participated in grooming with set up. Pt demonstrated ability to laterally scoot toward [facility] with supervision, but has yet to be able to stand. She is pleasant and motivated to return to independence. Continue to recommend SNF.    Follow Up Recommendations  SNF    Equipment Recommendations  None recommended by OT    Recommendations for Other Services       Precautions / Restrictions Precautions Precautions: Fall Precaution Comments: DVT R leg      Mobility Bed Mobility Overal bed mobility: Modified Independent                  Transfers     Transfers: Lateral/Scoot Transfers          Lateral/Scoot Transfers: Supervision General transfer comment: along the EOB    Balance Overall balance assessment: Needs assistance   Sitting balance-[name]: Good                                     ADL either performed or assessed with clinical judgement   ADL Overall ADL's : Needs assistance/impaired     Grooming: Oral care;Sitting;Set up                                       Vision         Perception     Praxis      Pertinent Vitals/Pain Pain Assessment: No/denies pain     Hand  Dominance     Extremity/Trunk Assessment             Communication     Cognition Arousal/Alertness: Awake/alert Behavior During Therapy: WFL for tasks assessed/performed Overall Cognitive Status: Within Functional Limits for tasks assessed                                     General Comments       Exercises     Shoulder Instructions      Home Living                                          Prior Functioning/Environment                   OT Problem List:        OT Treatment/Interventions:      OT Goals(Current goals can be found in the care plan section) Acute Rehab OT Goals Patient Stated Goal: to get stronger OT Goal Formulation: With patient  Time For Goal Achievement: 02/24/21 Potential to Achieve Goals: Good  OT Frequency: Min 2X/week   Barriers to D/C:            Co-evaluation              AM-PAC OT "6 Clicks" Daily Activity     Outcome Measure Help from another person eating meals?: None Help from another person taking care of personal grooming?: A Little Help from another person toileting, which includes using toliet, bedpan, or urinal?: A Lot Help from another person bathing (including washing, rinsing, drying)?: A Lot Help from another person to put on and taking off regular upper body clothing?: A Little Help from another person to put on and taking off regular lower body clothing?: Total 6 Click Score: 15   End of Session    Activity Tolerance: Patient tolerated treatment well Patient left: in bed;with bed alarm set;with call bell/phone within reach  OT Visit Diagnosis: Muscle weakness (generalized) (M62.81)                Time: 5631-4970 OT Time Calculation (min): 41 min Charges:  OT General Charges $OT Visit: 1 Visit OT Treatments $Self Care/Home Management : 38-52 mins  Martie Round, OTR/L Acute Rehabilitation Services Pager: [phone] Office: [phone]  Evern Bio 02/15/2021, 1:25 PM

## 2021-02-15 NOTE — Progress Notes (Signed)
PROGRESS NOTE    Pamela [name]  LOV:[id number] DOB: 1956-05-08 DOA: 02/08/2021 PCP: Corwin Levins, MD   Brief Narrative: 65 year old with past medical history significant for permanent A. fib on Eliquis, chronic diastolic heart failure, CKD stage IIIa, diabetes type 2, hypertension, hyperlipidemia, depression, anxiety presented to the ED for evaluation of right leg swelling. Recently hospitalized 12/17/2020 until 12/21/2020 for lower GI bleed related to diverticulosis and stercoral ulcer.  She was discharged to SNF.  Patient was sent home from skilled nursing facility on 02/07/2021 as her insurance will not pay for further days, patient continued to have knee and hip problems with generalized debility.  Also she did not get a prescription for Eliquis and had not taken it for 3 days.  On 9/1 she noticed increasing swelling and erythema to her right lower leg and presented for evaluation.   Assessment & Plan:   Principal Problem:   Cellulitis of right lower extremity Active Problems:   Type 2 diabetes mellitus (HCC)   Hypertension associated with diabetes (HCC)   CKD (chronic kidney disease) stage 3, GFR 30-59 ml/min (HCC)   Chronic atrial fibrillation (HCC)   Chronic diastolic heart failure (HCC)   Hyperlipidemia associated with type 2 diabetes mellitus (HCC)   Pressure injury of skin  1-Cellulitis of the right lower extremity age indeterminate right leg DVT: -Doppler: age indeterminate right popliteal vein.  -Patient initially improved on Ancef she was transitioned to Keflex on 9/7.  She continued to have some redness and drainage.  Added doxycycline. -Continue with Eliquis. -Continue with Laxis- -She will benefit from skilled rehab -continue with oral antibiotics.   2-Permanent A. fib: Continue with Eliquis Continue with diltiazem and Toprol  3-General debility, chronic bilateral knee pain, right hip pain: Continue with PT. From a skilled rehab  4-CKD stage IIIa: Stable  monitor  Hypokalemia replete orally  Diabetic neuropathy: Continue with Lyrica Type II Diabetes: Continue with gargling and sliding scale insulin. Will add meals coverage.        Pressure Injury 02/09/21 Buttocks Right Stage 2 -  Partial thickness loss of dermis presenting as a shallow open injury with a red, pink wound bed without slough. (Active)  02/09/21 2059  Location: Buttocks  Location Orientation: Right  Staging: Stage 2 -  Partial thickness loss of dermis presenting as a shallow open injury with a red, pink wound bed without slough.  Wound Description (Comments):   Present on Admission: Yes     Estimated body mass index is 46.4 kg/m as calculated from the following:   Height as of this encounter: 6' (1.829 m).   Weight as of this encounter: 155.2 kg.   DVT prophylaxis: Eliquis Code Status: DNR Family Communication: Care discussed with patient.  Disposition Plan:  Status is: Inpatient  Remains inpatient appropriate because:IV treatments appropriate due to intensity of illness or inability to take PO and Inpatient level of care appropriate due to severity of illness  Dispo: The patient is from: Home              Anticipated d/c is to: SNF              Patient currently is medically stable to d/c. Awaiting SNF   Difficult to place patient Yes        Consultants:  None  Procedures:    Antimicrobials:  Keflex, added Doxy  Subjective: She is alert, no new complaints.  Still with redness LE  Objective: Vitals:   02/14/21 1936 02/15/21  0441 02/15/21 0854 02/15/21 1110  BP: 107/69 (!) 104/59 110/67 101/60  Pulse: 87 92 77 93  Resp: 20 20 18 18   Temp: 99.1 F (37.3 C) 98.4 F (36.9 C) 98.2 F (36.8 C) 98.3 F (36.8 C)  TempSrc: Oral Oral Oral Oral  SpO2: 91% 91% 93% 95%  Weight:  (!) 155.2 kg    Height:        Intake/Output Summary (Last 24 hours) at 02/15/2021 1357 Last data filed at 02/15/2021 1112 Gross per 24 hour  Intake 480 ml  Output  1100 ml  Net -620 ml    Filed Weights   02/13/21 0146 02/14/21 0000 02/15/21 0441  Weight: (!) 154.4 kg (!) 153.7 kg (!) 155.2 kg    Examination:  General exam: NAD Respiratory system: CTA Cardiovascular system: S 1, S 2 RRR Gastrointestinal system: BS present, soft, nt Central nervous system: Alert and oriented  Extremities: Right lower extremity with redness, mild drainage from one of the blister   Data Reviewed: I have personally reviewed following labs and imaging studies  CBC: Recent Labs  Lab 02/08/21 1632 02/09/21 0448 02/10/21 0300 02/11/21 0146 02/13/21 0142 02/14/21 0307  WBC 12.8* 11.9* 12.9* 11.6* 10.3 11.1*  NEUTROABS 10.5*  --   --   --   --   --   HGB 10.4* 9.9* 8.9* 9.0* 9.4* 9.8*  HCT 36.9 34.0* 30.4* 31.3* 32.2* 33.0*  MCV 88.1 87.2 85.9 86.2 85.6 85.3  PLT 487* 401* 387 369 309 286    Basic Metabolic Panel: Recent Labs  Lab 02/09/21 0448 02/10/21 0300 02/11/21 0146 02/12/21 0410 02/13/21 0142 02/14/21 0307 02/15/21 0802  NA 139   < > 135 136 134* 136 133*  K 2.7*   < > 3.7 3.5 3.3* 3.4* 3.5  CL 101   < > 98 101 98 97* 98  CO2 29   < > 30 29 29 30 28   GLUCOSE 194*   < > 212* 218* 240* 221* 166*  BUN 10   < > 13 13 11 12 12   CREATININE 1.00   < > 1.42* 1.13* 1.02* 1.11* 1.00  CALCIUM 8.9   < > 9.0 8.8* 8.7* 9.2 8.6*  MG 2.0  --   --   --   --   --   --    < > = values in this interval not displayed.    GFR: Estimated Creatinine Clearance: 95 mL/min (by C-G formula based on SCr of 1 mg/dL). Liver Function Tests: Recent Labs  Lab 02/08/21 1632  AST 18  ALT 16  ALKPHOS 90  BILITOT 0.7  PROT 6.7  ALBUMIN 2.8*    No results for input(s): LIPASE, AMYLASE in the last 168 hours. No results for input(s): AMMONIA in the last 168 hours. Coagulation Profile: No results for input(s): INR, PROTIME in the last 168 hours. Cardiac Enzymes: No results for input(s): CKTOTAL, CKMB, CKMBINDEX, TROPONINI in the last 168 hours. BNP (last 3  results) Recent Labs    03/20/20 1145 06/21/20 1129  PROBNP 125.0* 139.0*    HbA1C: No results for input(s): HGBA1C in the last 72 hours. CBG: Recent Labs  Lab 02/14/21 1149 02/14/21 1609 02/14/21 2046 02/15/21 0559 02/15/21 1108  GLUCAP 217* 271* 218* 185* 175*    Lipid Profile: No results for input(s): CHOL, HDL, LDLCALC, TRIG, CHOLHDL, LDLDIRECT in the last 72 hours. Thyroid Function Tests: No results for input(s): TSH, T4TOTAL, FREET4, T3FREE, THYROIDAB in the last 72 hours. Anemia  Panel: No results for input(s): VITAMINB12, FOLATE, FERRITIN, TIBC, IRON, RETICCTPCT in the last 72 hours. Sepsis Labs: No results for input(s): PROCALCITON, LATICACIDVEN in the last 168 hours.  Recent Results (from the past 240 hour(s))  Resp Panel by RT-PCR (Flu A&B, Covid) Nasopharyngeal Swab     Status: None   Collection Time: 02/08/21  9:20 PM   Specimen: Nasopharyngeal Swab; Nasopharyngeal(NP) swabs in vial transport medium  Result Value Ref Range Status   SARS Coronavirus 2 by RT PCR NEGATIVE NEGATIVE Final    Comment: (NOTE) SARS-CoV-2 target nucleic acids are NOT DETECTED.  The SARS-CoV-2 RNA is generally detectable in upper respiratory specimens during the acute phase of infection. The lowest concentration of SARS-CoV-2 viral copies this assay can detect is 138 copies/mL. A negative result does not preclude SARS-Cov-2 infection and should not be used as the sole basis for treatment or other patient management decisions. A negative result may occur with  improper specimen collection/handling, submission of specimen other than nasopharyngeal swab, presence of viral mutation(s) within the areas targeted by this assay, and inadequate number of viral copies(<138 copies/mL). A negative result must be combined with clinical observations, patient history, and epidemiological information. The expected result is Negative.  Fact Sheet for Patients:   BloggerCourse.comhttps://www.fda.gov/media/152166/download  Fact Sheet for Healthcare Providers:  SeriousBroker.ithttps://www.fda.gov/media/152162/download  This test is no t yet approved or cleared by the [place] FDA and  has been authorized for detection and/or diagnosis of SARS-CoV-2 by FDA under an Emergency Use Authorization (EUA). This EUA will remain  in effect (meaning this test can be used) for the duration of the COVID-19 declaration under Section 564(b)(1) of the Act, 21 U.S.C.section 360bbb-3(b)(1), unless the authorization is terminated  or revoked sooner.       Influenza A by PCR NEGATIVE NEGATIVE Final   Influenza B by PCR NEGATIVE NEGATIVE Final    Comment: (NOTE) The Xpert Xpress SARS-CoV-2/FLU/RSV plus assay is intended as an aid in the diagnosis of influenza from Nasopharyngeal swab specimens and should not be used as a sole basis for treatment. Nasal washings and aspirates are unacceptable for Xpert Xpress SARS-CoV-2/FLU/RSV testing.  Fact Sheet for Patients: BloggerCourse.comhttps://www.fda.gov/media/152166/download  Fact Sheet for Healthcare Providers: SeriousBroker.ithttps://www.fda.gov/media/152162/download  This test is not yet approved or cleared by the [place] FDA and has been authorized for detection and/or diagnosis of SARS-CoV-2 by FDA under an Emergency Use Authorization (EUA). This EUA will remain in effect (meaning this test can be used) for the duration of the COVID-19 declaration under Section 564(b)(1) of the Act, 21 U.S.C. section 360bbb-3(b)(1), unless the authorization is terminated or revoked.  Performed at [facility], [address] [place], [place] [address]           Radiology Studies: No results found.      Scheduled Meds:  apixaban  5 mg Oral BID   atorvastatin  40 mg Oral Daily   cephALEXin  250 mg Oral Q8H   diltiazem  360 mg Oral Daily   doxycycline  100 mg Oral Q12H   ferrous sulfate  325 mg Oral Q24H   furosemide  40 mg Oral BID   Gerhardt's butt  cream   Topical BID   influenza vac split quadrivalent PF  0.5 mL Intramuscular Tomorrow-1000   insulin aspart  0-9 Units Subcutaneous TID WC   insulin aspart  3 Units Subcutaneous TID WC   insulin glargine-yfgn  25 Units Subcutaneous Daily   metoprolol succinate  100 mg Oral Daily  potassium chloride  40 mEq Oral Daily   pregabalin  50 mg Oral TID   sodium chloride flush  3 mL Intravenous Q12H   Continuous Infusions:   LOS: 3 days    Time spent: 35 minutes.     Alba Cory, MD [facility]   If 7PM-7AM, please contact night-coverage [email or web address]  02/15/2021, 1:57 PM

## 2021-02-15 NOTE — TOC Progression Note (Signed)
Transition of Care [facility]) - Progression Note    Patient Details  Name: KEARSTYN AVITIA MRN: [id number] Date of Birth: September 10, 1955  [facility]) CM/SW Contact  Ivette Loyal, [place] Phone Number: 02/15/2021, 1:19 PM  Clinical Narrative:    CSW spoke with pt about returning to [place] if they are willing to work with the pt medicare pending. Pt declined [place] stating she did not have a good experience with her last stay.   CSW followed up with [facility] (pt second choice) to see if they were willing to take pt with Medicare pending, Janie at [facility] declined stating pt needs medicare ID. CSW also contacted [facility], they are not taking any medicaid beds at the moment.  CSW shared info with pt who is adament on not going to [place], CSW will follow up with pt on Monday to figure out home DC options.   Expected Discharge Plan: Skilled Nursing Facility Barriers to Discharge: Continued Medical Work up  Expected Discharge Plan and Services Expected Discharge Plan: Skilled Nursing Facility In-house Referral: Clinical Social Work     Living arrangements for the past 2 months: Single Family Home                                       Social Determinants of Health (SDOH) Interventions    Readmission Risk Interventions No flowsheet data found.

## 2021-02-16 DIAGNOSIS — L03115 Cellulitis of right lower limb: Secondary | ICD-10-CM | POA: Diagnosis not present

## 2021-02-16 LAB — BASIC METABOLIC PANEL
Anion gap: 7 (ref 5–15)
BUN: 12 mg/dL (ref 8–23)
CO2: 28 mmol/L (ref 22–32)
Calcium: 8.6 mg/dL — ABNORMAL LOW (ref 8.9–10.3)
Chloride: 99 mmol/L (ref 98–111)
Creatinine, Ser: 1.05 mg/dL — ABNORMAL HIGH (ref 0.44–1.00)
GFR, Estimated: 59 mL/min — ABNORMAL LOW (ref >60)
Glucose, Bld: 170 mg/dL — ABNORMAL HIGH (ref 70–99)
Potassium: 3.4 mmol/L — ABNORMAL LOW (ref 3.5–5.1)
Sodium: 134 mmol/L — ABNORMAL LOW (ref 135–145)

## 2021-02-16 LAB — GLUCOSE, CAPILLARY
Glucose-Capillary: 200 mg/dL — ABNORMAL HIGH (ref 70–99)
Glucose-Capillary: 228 mg/dL — ABNORMAL HIGH (ref 70–99)
Glucose-Capillary: 194 mg/dL — ABNORMAL HIGH (ref 70–99)
Glucose-Capillary: 199 mg/dL — ABNORMAL HIGH (ref 70–99)

## 2021-02-16 LAB — CBC
HCT: 32.8 % — ABNORMAL LOW (ref 36.0–46.0)
Hemoglobin: 9.5 g/dL — ABNORMAL LOW (ref 12.0–15.0)
MCH: 24.5 pg — ABNORMAL LOW (ref 26.0–34.0)
MCHC: 29 g/dL — ABNORMAL LOW (ref 30.0–36.0)
MCV: 84.8 fL (ref 80.0–100.0)
Platelets: 263 10*3/uL (ref 150–400)
RBC: 3.87 MIL/uL (ref 3.87–5.11)
RDW: 18.1 % — ABNORMAL HIGH (ref 11.5–15.5)
WBC: 12.7 10*3/uL — ABNORMAL HIGH (ref 4.0–10.5)
nRBC: 0 % (ref 0.0–0.2)

## 2021-02-16 MED ORDER — POTASSIUM CHLORIDE CRYS ER 20 MEQ PO TBCR
40.0000 meq | EXTENDED_RELEASE_TABLET | Freq: Once | ORAL | Status: AC
  Administered 2021-02-16: 40 meq via ORAL
  Filled 2021-02-16: qty 2

## 2021-02-16 MED ORDER — CEPHALEXIN 250 MG PO CAPS
500.0000 mg | ORAL_CAPSULE | Freq: Three times a day (TID) | ORAL | Status: DC
  Administered 2021-02-16 – 2021-02-17 (×4): 500 mg via ORAL
  Filled 2021-02-16 (×4): qty 2

## 2021-02-16 NOTE — Progress Notes (Signed)
PROGRESS NOTE    Pamela [name]  IEP:[id number] DOB: Sep 20, 1955 DOA: 02/08/2021 PCP: Corwin Levins, MD   Brief Narrative: 65 year old with past medical history significant for permanent A. fib on Eliquis, chronic diastolic heart failure, CKD stage IIIa, diabetes type 2, hypertension, hyperlipidemia, depression, anxiety presented to the ED for evaluation of right leg swelling. Recently hospitalized 12/17/2020 until 12/21/2020 for lower GI bleed related to diverticulosis and stercoral ulcer.  She was discharged to SNF.  Patient was sent home from skilled nursing facility on 02/07/2021 as her insurance will not pay for further days, patient continued to have knee and hip problems with generalized debility.  Also she did not get a prescription for Eliquis and had not taken it for 3 days.  On 9/1 she noticed increasing swelling and erythema to her right lower leg and presented for evaluation.   Assessment & Plan:   Principal Problem:   Cellulitis of right lower extremity Active Problems:   Type 2 diabetes mellitus (HCC)   Hypertension associated with diabetes (HCC)   CKD (chronic kidney disease) stage 3, GFR 30-59 ml/min (HCC)   Chronic atrial fibrillation (HCC)   Chronic diastolic heart failure (HCC)   Hyperlipidemia associated with type 2 diabetes mellitus (HCC)   Pressure injury of skin  1-Cellulitis of the right lower extremity age indeterminate right leg DVT: -Doppler: age indeterminate right popliteal vein.  -Patient initially improved on Ancef she was transitioned to Keflex on 9/7.  She continued to have some redness and drainage. Continue with doxycycline. -Continue with Eliquis. -Continue with Laxis- -She will benefit from skilled rehab -continue with oral antibiotics. Monitor WBC  2-Permanent A. fib: Continue with Eliquis Continue with diltiazem and Toprol  3-General debility, chronic bilateral knee pain, right hip pain: Continue with PT. From a skilled rehab  4-CKD stage IIIa:  Stable monitor  Hypokalemia replete orally  Diabetic neuropathy: Continue with Lyrica Type II Diabetes: Continue with gargling and sliding scale insulin. Will add meals coverage.        Pressure Injury 02/09/21 Buttocks Right Stage 2 -  Partial thickness loss of dermis presenting as a shallow open injury with a red, pink wound bed without slough. (Active)  02/09/21 2059  Location: Buttocks  Location Orientation: Right  Staging: Stage 2 -  Partial thickness loss of dermis presenting as a shallow open injury with a red, pink wound bed without slough.  Wound Description (Comments):   Present on Admission: Yes     Estimated body mass index is 46.02 kg/m as calculated from the following:   Height as of this encounter: 6' (1.829 m).   Weight as of this encounter: 153.9 kg.   DVT prophylaxis: Eliquis Code Status: DNR Family Communication: Care discussed with patient.  Disposition Plan:  Status is: Inpatient  Remains inpatient appropriate because:IV treatments appropriate due to intensity of illness or inability to take PO and Inpatient level of care appropriate due to severity of illness  Dispo: The patient is from: Home              Anticipated d/c is to: SNF              Patient currently is medically stable to d/c. Awaiting SNF   Difficult to place patient Yes        Consultants:  None  Procedures:    Antimicrobials:  Keflex, added Doxy  Subjective: No new complaints.   Objective: Vitals:   02/15/21 1927 02/16/21 0610 02/16/21 0825 02/16/21 1018  BP: 92/69 103/66  117/64  Pulse: 90 (!) 102 88 (!) 101  Resp: 20 (!) 22 17 18   Temp: 99.3 F (37.4 C) 98.3 F (36.8 C)  99.4 F (37.4 C)  TempSrc: Oral Oral  Oral  SpO2: 93% 94%  94%  Weight:  (!) 153.9 kg    Height:        Intake/Output Summary (Last 24 hours) at 02/16/2021 1158 Last data filed at 02/16/2021 1001 Gross per 24 hour  Intake 480 ml  Output 2050 ml  Net -1570 ml    Filed Weights    02/14/21 0000 02/15/21 0441 02/16/21 0610  Weight: (!) 153.7 kg (!) 155.2 kg (!) 153.9 kg    Examination:  General exam: NAD Respiratory system: CTA Cardiovascular system: S 1, S 2 RRR Gastrointestinal system: BS present, soft,nt Central nervous system: alert Extremities: Right lower extremity with redness, mild drainage from one of the blister   Data Reviewed: I have personally reviewed following labs and imaging studies  CBC: Recent Labs  Lab 02/10/21 0300 02/11/21 0146 02/13/21 0142 02/14/21 0307 02/16/21 0828  WBC 12.9* 11.6* 10.3 11.1* 12.7*  HGB 8.9* 9.0* 9.4* 9.8* 9.5*  HCT 30.4* 31.3* 32.2* 33.0* 32.8*  MCV 85.9 86.2 85.6 85.3 84.8  PLT 387 369 309 286 263    Basic Metabolic Panel: Recent Labs  Lab 02/12/21 0410 02/13/21 0142 02/14/21 0307 02/15/21 0802 02/16/21 0828  NA 136 134* 136 133* 134*  K 3.5 3.3* 3.4* 3.5 3.4*  CL 101 98 97* 98 99  CO2 29 29 30 28 28   GLUCOSE 218* 240* 221* 166* 170*  BUN 13 11 12 12 12   CREATININE 1.13* 1.02* 1.11* 1.00 1.05*  CALCIUM 8.8* 8.7* 9.2 8.6* 8.6*    GFR: Estimated Creatinine Clearance: 90.1 mL/min (A) (by C-G formula based on SCr of 1.05 mg/dL (H)). Liver Function Tests: No results for input(s): AST, ALT, ALKPHOS, BILITOT, PROT, ALBUMIN in the last 168 hours.  No results for input(s): LIPASE, AMYLASE in the last 168 hours. No results for input(s): AMMONIA in the last 168 hours. Coagulation Profile: No results for input(s): INR, PROTIME in the last 168 hours. Cardiac Enzymes: No results for input(s): CKTOTAL, CKMB, CKMBINDEX, TROPONINI in the last 168 hours. BNP (last 3 results) Recent Labs    03/20/20 1145 06/21/20 1129  PROBNP 125.0* 139.0*    HbA1C: No results for input(s): HGBA1C in the last 72 hours. CBG: Recent Labs  Lab 02/15/21 1108 02/15/21 1632 02/15/21 2040 02/16/21 0609 02/16/21 1128  GLUCAP 175* 193* 214* 200* 228*    Lipid Profile: No results for input(s): CHOL, HDL, LDLCALC,  TRIG, CHOLHDL, LDLDIRECT in the last 72 hours. Thyroid Function Tests: No results for input(s): TSH, T4TOTAL, FREET4, T3FREE, THYROIDAB in the last 72 hours. Anemia Panel: No results for input(s): VITAMINB12, FOLATE, FERRITIN, TIBC, IRON, RETICCTPCT in the last 72 hours. Sepsis Labs: No results for input(s): PROCALCITON, LATICACIDVEN in the last 168 hours.  Recent Results (from the past 240 hour(s))  Resp Panel by RT-PCR (Flu A&B, Covid) Nasopharyngeal Swab     Status: None   Collection Time: 02/08/21  9:20 PM   Specimen: Nasopharyngeal Swab; Nasopharyngeal(NP) swabs in vial transport medium  Result Value Ref Range Status   SARS Coronavirus 2 by RT PCR NEGATIVE NEGATIVE Final    Comment: (NOTE) SARS-CoV-2 target nucleic acids are NOT DETECTED.  The SARS-CoV-2 RNA is generally detectable in upper respiratory specimens during the acute phase of infection. The lowest concentration  of SARS-CoV-2 viral copies this assay can detect is 138 copies/mL. A negative result does not preclude SARS-Cov-2 infection and should not be used as the sole basis for treatment or other patient management decisions. A negative result may occur with  improper specimen collection/handling, submission of specimen other than nasopharyngeal swab, presence of viral mutation(s) within the areas targeted by this assay, and inadequate number of viral copies(<138 copies/mL). A negative result must be combined with clinical observations, patient history, and epidemiological information. The expected result is Negative.  Fact Sheet for Patients:  BloggerCourse.com  Fact Sheet for Healthcare Providers:  SeriousBroker.it  This test is no t yet approved or cleared by the [place] FDA and  has been authorized for detection and/or diagnosis of SARS-CoV-2 by FDA under an Emergency Use Authorization (EUA). This EUA will remain  in effect (meaning this test can be  used) for the duration of the COVID-19 declaration under Section 564(b)(1) of the Act, 21 U.S.C.section 360bbb-3(b)(1), unless the authorization is terminated  or revoked sooner.       Influenza A by PCR NEGATIVE NEGATIVE Final   Influenza B by PCR NEGATIVE NEGATIVE Final    Comment: (NOTE) The Xpert Xpress SARS-CoV-2/FLU/RSV plus assay is intended as an aid in the diagnosis of influenza from Nasopharyngeal swab specimens and should not be used as a sole basis for treatment. Nasal washings and aspirates are unacceptable for Xpert Xpress SARS-CoV-2/FLU/RSV testing.  Fact Sheet for Patients: BloggerCourse.com  Fact Sheet for Healthcare Providers: SeriousBroker.it  This test is not yet approved or cleared by the [place] FDA and has been authorized for detection and/or diagnosis of SARS-CoV-2 by FDA under an Emergency Use Authorization (EUA). This EUA will remain in effect (meaning this test can be used) for the duration of the COVID-19 declaration under Section 564(b)(1) of the Act, 21 U.S.C. section 360bbb-3(b)(1), unless the authorization is terminated or revoked.  Performed at [facility], [address] [place], [place] [address]           Radiology Studies: No results found.      Scheduled Meds:  apixaban  5 mg Oral BID   atorvastatin  40 mg Oral Daily   cephALEXin  250 mg Oral Q8H   diltiazem  360 mg Oral Daily   doxycycline  100 mg Oral Q12H   ferrous sulfate  325 mg Oral Q24H   furosemide  40 mg Oral BID   Gerhardt's butt cream   Topical BID   insulin aspart  0-9 Units Subcutaneous TID WC   insulin aspart  3 Units Subcutaneous TID WC   insulin glargine-yfgn  25 Units Subcutaneous Daily   metoprolol succinate  100 mg Oral Daily   potassium chloride  40 mEq Oral Daily   pregabalin  50 mg Oral TID   sodium chloride flush  3 mL Intravenous Q12H   Continuous Infusions:   LOS: 4 days     Time spent: 35 minutes.     Alba Cory, MD [facility]   If 7PM-7AM, please contact night-coverage [email or web address]  02/16/2021, 11:58 AM

## 2021-02-17 DIAGNOSIS — L03115 Cellulitis of right lower limb: Secondary | ICD-10-CM | POA: Diagnosis not present

## 2021-02-17 LAB — CBC
HCT: 32.6 % — ABNORMAL LOW (ref 36.0–46.0)
Hemoglobin: 9.6 g/dL — ABNORMAL LOW (ref 12.0–15.0)
MCH: 25.1 pg — ABNORMAL LOW (ref 26.0–34.0)
MCHC: 29.4 g/dL — ABNORMAL LOW (ref 30.0–36.0)
MCV: 85.3 fL (ref 80.0–100.0)
Platelets: 252 10*3/uL (ref 150–400)
RBC: 3.82 MIL/uL — ABNORMAL LOW (ref 3.87–5.11)
RDW: 17.8 % — ABNORMAL HIGH (ref 11.5–15.5)
WBC: 10.6 10*3/uL — ABNORMAL HIGH (ref 4.0–10.5)
nRBC: 0 % (ref 0.0–0.2)

## 2021-02-17 LAB — GLUCOSE, CAPILLARY
Glucose-Capillary: 197 mg/dL — ABNORMAL HIGH (ref 70–99)
Glucose-Capillary: 219 mg/dL — ABNORMAL HIGH (ref 70–99)
Glucose-Capillary: 193 mg/dL — ABNORMAL HIGH (ref 70–99)
Glucose-Capillary: 224 mg/dL — ABNORMAL HIGH (ref 70–99)

## 2021-02-17 LAB — BASIC METABOLIC PANEL
Anion gap: 9 (ref 5–15)
BUN: 13 mg/dL (ref 8–23)
CO2: 29 mmol/L (ref 22–32)
Calcium: 9 mg/dL (ref 8.9–10.3)
Chloride: 98 mmol/L (ref 98–111)
Creatinine, Ser: 0.95 mg/dL (ref 0.44–1.00)
GFR, Estimated: >60 mL/min (ref >60)
Glucose, Bld: 192 mg/dL — ABNORMAL HIGH (ref 70–99)
Potassium: 3.5 mmol/L (ref 3.5–5.1)
Sodium: 136 mmol/L (ref 135–145)

## 2021-02-17 MED ORDER — CEPHALEXIN 250 MG PO CAPS
500.0000 mg | ORAL_CAPSULE | Freq: Three times a day (TID) | ORAL | Status: AC
  Administered 2021-02-17 – 2021-02-18 (×4): 500 mg via ORAL
  Filled 2021-02-17 (×4): qty 2

## 2021-02-17 NOTE — Progress Notes (Signed)
PROGRESS NOTE    Pamela [name]  TDV:[id number] DOB: 1955-12-01 DOA: 02/08/2021 PCP: Corwin Levins, MD   Brief Narrative: 65 year old with past medical history significant for permanent A. fib on Eliquis, chronic diastolic heart failure, CKD stage IIIa, diabetes type 2, hypertension, hyperlipidemia, depression, anxiety presented to the ED for evaluation of right leg swelling. Recently hospitalized 12/17/2020 until 12/21/2020 for lower GI bleed related to diverticulosis and stercoral ulcer.  She was discharged to SNF.  Patient was sent home from skilled nursing facility on 02/07/2021 as her insurance will not pay for further days, patient continued to have knee and hip problems with generalized debility.  Also she did not get a prescription for Eliquis and had not taken it for 3 days.  On 9/1 she noticed increasing swelling and erythema to her right lower leg and presented for evaluation.   Assessment & Plan:   Principal Problem:   Cellulitis of right lower extremity Active Problems:   Type 2 diabetes mellitus (HCC)   Hypertension associated with diabetes (HCC)   CKD (chronic kidney disease) stage 3, GFR 30-59 ml/min (HCC)   Chronic atrial fibrillation (HCC)   Chronic diastolic heart failure (HCC)   Hyperlipidemia associated with type 2 diabetes mellitus (HCC)   Pressure injury of skin  1-Cellulitis of the right lower extremity age indeterminate right leg DVT: -Doppler: age indeterminate right popliteal vein.  -Patient initially improved on Ancef she was transitioned to Keflex on 9/7. Day 9/10 of keflex. DC keflex tomorrow.  - Started on doxycycline to cover for MRSA, day 4 -Continue with Eliquis. -Continue with Laxis- -She will benefit from skilled rehab -continue with oral antibiotics. Monitor WBC -WBC trending down.   2-Permanent A. fib: Continue with Eliquis Continue with diltiazem and Toprol  3-General debility, chronic bilateral knee pain, right hip pain: Continue with PT. From  a skilled rehab  4-CKD stage IIIa: Stable monitor  Hypokalemia replete orally  Diabetic neuropathy: Continue with Lyrica Type II Diabetes: Continue with gargling and sliding scale insulin. Added meals coverage.        Pressure Injury 02/09/21 Buttocks Right Stage 2 -  Partial thickness loss of dermis presenting as a shallow open injury with a red, pink wound bed without slough. (Active)  02/09/21 2059  Location: Buttocks  Location Orientation: Right  Staging: Stage 2 -  Partial thickness loss of dermis presenting as a shallow open injury with a red, pink wound bed without slough.  Wound Description (Comments):   Present on Admission: Yes     Estimated body mass index is 45.63 kg/m as calculated from the following:   Height as of this encounter: 6' (1.829 m).   Weight as of this encounter: 152.6 kg.   DVT prophylaxis: Eliquis Code Status: DNR Family Communication: Care discussed with patient.  Disposition Plan:  Status is: Inpatient  Remains inpatient appropriate because:IV treatments appropriate due to intensity of illness or inability to take PO and Inpatient level of care appropriate due to severity of illness  Dispo: The patient is from: Home              Anticipated d/c is to: SNF              Patient currently is medically stable to d/c. Awaiting SNF   Difficult to place patient Yes        Consultants:  None  Procedures:    Antimicrobials:  Keflex, added Doxy  Subjective: No new complaints.    Objective: Vitals:  02/16/21 2005 02/17/21 0100 02/17/21 0425 02/17/21 1058  BP: 111/64  137/87 115/76  Pulse: 98  72 78  Resp: 18  18 18   Temp: 98 F (36.7 C)  98.4 F (36.9 C) 98.1 F (36.7 C)  TempSrc: Oral  Oral Oral  SpO2: 94%  96% 97%  Weight:  (!) 152.6 kg    Height:        Intake/Output Summary (Last 24 hours) at 02/17/2021 1447 Last data filed at 02/17/2021 1430 Gross per 24 hour  Intake 1437 ml  Output 2150 ml  Net -713 ml     Filed Weights   02/15/21 0441 02/16/21 0610 02/17/21 0100  Weight: (!) 155.2 kg (!) 153.9 kg (!) 152.6 kg    Examination:  General exam: NAD Respiratory system: CTA Cardiovascular system: S 1, S 2 RRR Gastrointestinal system: BS present, soft, nt Central nervous system: Alert Extremities: Right lower extremities with less redness   Data Reviewed: I have personally reviewed following labs and imaging studies  CBC: Recent Labs  Lab 02/11/21 0146 02/13/21 0142 02/14/21 0307 02/16/21 0828 02/17/21 0755  WBC 11.6* 10.3 11.1* 12.7* 10.6*  HGB 9.0* 9.4* 9.8* 9.5* 9.6*  HCT 31.3* 32.2* 33.0* 32.8* 32.6*  MCV 86.2 85.6 85.3 84.8 85.3  PLT 369 309 286 263 252    Basic Metabolic Panel: Recent Labs  Lab 02/13/21 0142 02/14/21 0307 02/15/21 0802 02/16/21 0828 02/17/21 0755  NA 134* 136 133* 134* 136  K 3.3* 3.4* 3.5 3.4* 3.5  CL 98 97* 98 99 98  CO2 29 30 28 28 29   GLUCOSE 240* 221* 166* 170* 192*  BUN 11 12 12 12 13   CREATININE 1.02* 1.11* 1.00 1.05* 0.95  CALCIUM 8.7* 9.2 8.6* 8.6* 9.0    GFR: Estimated Creatinine Clearance: 99.1 mL/min (by C-G formula based on SCr of 0.95 mg/dL). Liver Function Tests: No results for input(s): AST, ALT, ALKPHOS, BILITOT, PROT, ALBUMIN in the last 168 hours.  No results for input(s): LIPASE, AMYLASE in the last 168 hours. No results for input(s): AMMONIA in the last 168 hours. Coagulation Profile: No results for input(s): INR, PROTIME in the last 168 hours. Cardiac Enzymes: No results for input(s): CKTOTAL, CKMB, CKMBINDEX, TROPONINI in the last 168 hours. BNP (last 3 results) Recent Labs    03/20/20 1145 06/21/20 1129  PROBNP 125.0* 139.0*    HbA1C: No results for input(s): HGBA1C in the last 72 hours. CBG: Recent Labs  Lab 02/16/21 1128 02/16/21 1626 02/16/21 2110 02/17/21 0609 02/17/21 1058  GLUCAP 228* 194* 199* 197* 219*    Lipid Profile: No results for input(s): CHOL, HDL, LDLCALC, TRIG, CHOLHDL,  LDLDIRECT in the last 72 hours. Thyroid Function Tests: No results for input(s): TSH, T4TOTAL, FREET4, T3FREE, THYROIDAB in the last 72 hours. Anemia Panel: No results for input(s): VITAMINB12, FOLATE, FERRITIN, TIBC, IRON, RETICCTPCT in the last 72 hours. Sepsis Labs: No results for input(s): PROCALCITON, LATICACIDVEN in the last 168 hours.  Recent Results (from the past 240 hour(s))  Resp Panel by RT-PCR (Flu A&B, Covid) Nasopharyngeal Swab     Status: None   Collection Time: 02/08/21  9:20 PM   Specimen: Nasopharyngeal Swab; Nasopharyngeal(NP) swabs in vial transport medium  Result Value Ref Range Status   SARS Coronavirus 2 by RT PCR NEGATIVE NEGATIVE Final    Comment: (NOTE) SARS-CoV-2 target nucleic acids are NOT DETECTED.  The SARS-CoV-2 RNA is generally detectable in upper respiratory specimens during the acute phase of infection. The lowest concentration of  SARS-CoV-2 viral copies this assay can detect is 138 copies/mL. A negative result does not preclude SARS-Cov-2 infection and should not be used as the sole basis for treatment or other patient management decisions. A negative result may occur with  improper specimen collection/handling, submission of specimen other than nasopharyngeal swab, presence of viral mutation(s) within the areas targeted by this assay, and inadequate number of viral copies(<138 copies/mL). A negative result must be combined with clinical observations, patient history, and epidemiological information. The expected result is Negative.  Fact Sheet for Patients:  BloggerCourse.com  Fact Sheet for Healthcare Providers:  SeriousBroker.it  This test is no t yet approved or cleared by the [place] FDA and  has been authorized for detection and/or diagnosis of SARS-CoV-2 by FDA under an Emergency Use Authorization (EUA). This EUA will remain  in effect (meaning this test can be used) for the  duration of the COVID-19 declaration under Section 564(b)(1) of the Act, 21 U.S.C.section 360bbb-3(b)(1), unless the authorization is terminated  or revoked sooner.       Influenza A by PCR NEGATIVE NEGATIVE Final   Influenza B by PCR NEGATIVE NEGATIVE Final    Comment: (NOTE) The Xpert Xpress SARS-CoV-2/FLU/RSV plus assay is intended as an aid in the diagnosis of influenza from Nasopharyngeal swab specimens and should not be used as a sole basis for treatment. Nasal washings and aspirates are unacceptable for Xpert Xpress SARS-CoV-2/FLU/RSV testing.  Fact Sheet for Patients: BloggerCourse.com  Fact Sheet for Healthcare Providers: SeriousBroker.it  This test is not yet approved or cleared by the [place] FDA and has been authorized for detection and/or diagnosis of SARS-CoV-2 by FDA under an Emergency Use Authorization (EUA). This EUA will remain in effect (meaning this test can be used) for the duration of the COVID-19 declaration under Section 564(b)(1) of the Act, 21 U.S.C. section 360bbb-3(b)(1), unless the authorization is terminated or revoked.  Performed at [facility], [address], [place], [place] [address]           Radiology Studies: No results found.      Scheduled Meds:  apixaban  5 mg Oral BID   atorvastatin  40 mg Oral Daily   cephALEXin  500 mg Oral Q8H   diltiazem  360 mg Oral Daily   doxycycline  100 mg Oral Q12H   ferrous sulfate  325 mg Oral Q24H   furosemide  40 mg Oral BID   Gerhardt's butt cream   Topical BID   insulin aspart  0-9 Units Subcutaneous TID WC   insulin aspart  3 Units Subcutaneous TID WC   insulin glargine-yfgn  25 Units Subcutaneous Daily   metoprolol succinate  100 mg Oral Daily   potassium chloride  40 mEq Oral Daily   pregabalin  50 mg Oral TID   sodium chloride flush  3 mL Intravenous Q12H   Continuous Infusions:   LOS: 5 days    Time spent: 35  minutes.     Alba Cory, MD [facility]   If 7PM-7AM, please contact night-coverage [email or web address]  02/17/2021, 2:47 PM

## 2021-02-18 DIAGNOSIS — L03115 Cellulitis of right lower limb: Secondary | ICD-10-CM | POA: Diagnosis not present

## 2021-02-18 LAB — RESP PANEL BY RT-PCR (FLU A&B, COVID) ARPGX2
Influenza A by PCR: NEGATIVE
Influenza B by PCR: NEGATIVE
SARS Coronavirus 2 by RT PCR: NEGATIVE

## 2021-02-18 LAB — BASIC METABOLIC PANEL
Anion gap: 9 (ref 5–15)
BUN: 17 mg/dL (ref 8–23)
CO2: 30 mmol/L (ref 22–32)
Calcium: 8.9 mg/dL (ref 8.9–10.3)
Chloride: 95 mmol/L — ABNORMAL LOW (ref 98–111)
Creatinine, Ser: 1.24 mg/dL — ABNORMAL HIGH (ref 0.44–1.00)
GFR, Estimated: 49 mL/min — ABNORMAL LOW (ref >60)
Glucose, Bld: 225 mg/dL — ABNORMAL HIGH (ref 70–99)
Potassium: 3.2 mmol/L — ABNORMAL LOW (ref 3.5–5.1)
Sodium: 134 mmol/L — ABNORMAL LOW (ref 135–145)

## 2021-02-18 LAB — GLUCOSE, CAPILLARY
Glucose-Capillary: 180 mg/dL — ABNORMAL HIGH (ref 70–99)
Glucose-Capillary: 219 mg/dL — ABNORMAL HIGH (ref 70–99)
Glucose-Capillary: 225 mg/dL — ABNORMAL HIGH (ref 70–99)
Glucose-Capillary: 224 mg/dL — ABNORMAL HIGH (ref 70–99)

## 2021-02-18 MED ORDER — FUROSEMIDE 40 MG PO TABS: 40.0000 mg | ORAL_TABLET | Freq: Two times a day (BID) | ORAL | 0 refills | Status: AC

## 2021-02-18 MED ORDER — POTASSIUM CHLORIDE CRYS ER 20 MEQ PO TBCR: 40.0000 meq | EXTENDED_RELEASE_TABLET | Freq: Two times a day (BID) | ORAL | 0 refills | Status: DC

## 2021-02-18 MED ORDER — INSULIN GLARGINE-YFGN 100 UNIT/ML ~~LOC~~ SOLN: 25.0000 [IU] | Freq: Every day | SUBCUTANEOUS | 11 refills | Status: AC

## 2021-02-18 MED ORDER — DOXYCYCLINE HYCLATE 100 MG PO TABS: 100.0000 mg | ORAL_TABLET | Freq: Two times a day (BID) | ORAL | 0 refills | Status: DC

## 2021-02-18 MED ORDER — POTASSIUM CHLORIDE CRYS ER 20 MEQ PO TBCR
40.0000 meq | EXTENDED_RELEASE_TABLET | Freq: Once | ORAL | Status: AC
  Administered 2021-02-18: 40 meq via ORAL
  Filled 2021-02-18: qty 2

## 2021-02-18 MED ORDER — APIXABAN 5 MG PO TABS: 5.0000 mg | ORAL_TABLET | Freq: Two times a day (BID) | ORAL | 3 refills | Status: AC

## 2021-02-18 NOTE — Discharge Summary (Signed)
Physician Discharge Summary  Pamela [name] DDU:[id number] DOB: June 26, 1955 DOA: 02/08/2021  PCP: Corwin Levins, MD  Admit date: 02/08/2021 Discharge date: 02/18/2021  Admitted From: Home  Disposition:  Home   Recommendations for Outpatient Follow-up:  Follow up with PCP in 1-2 weeks Please obtain BMP/CBC in one week Please follow up on the following pending results: adjust Diabetes Medication as needed.  Follow up resolution of Cellulitis.     Discharge Condition: Stable.  CODE STATUS: DNR Diet recommendation: Carb Modified   Brief/Interim Summary: 65 year old with past medical history significant for permanent A. fib on Eliquis, chronic diastolic heart failure, CKD stage IIIa, diabetes type 2, hypertension, hyperlipidemia, depression, anxiety presented to the ED for evaluation of right leg swelling. Recently hospitalized 12/17/2020 until 12/21/2020 for lower GI bleed related to diverticulosis and stercoral ulcer.  She was discharged to SNF.  Patient was sent home from skilled nursing facility on 02/07/2021 as her insurance will not pay for further days, patient continued to have knee and hip problems with generalized debility.  Also she did not get a prescription for Eliquis and had not taken it for 3 days.  On 9/1 she noticed increasing swelling and erythema to her right lower leg and presented for evaluation.  1-Cellulitis of the right lower extremity 65 indeterminate right leg DVT: -Doppler: age indeterminate right popliteal vein.  -Patient initially improved on Ancef she was transitioned to Keflex on 9/7. Day 10/10 of keflex. DC keflex today. - Started on doxycycline to cover for MRSA, day 4, discharge on 3 days.  -Continue with Eliquis. -Continue with Laxis- -She will benefit from skilled rehab -continue with oral antibiotics. Monitor WBC -WBC trending down.    2-Permanent A. fib: Continue with Eliquis Continue with diltiazem and Toprol   3-General debility, chronic bilateral knee  pain, right hip pain: Continue with PT. From a skilled rehab   4-CKD stage IIIa: Stable monitor Holding Cozaar. Hypokalemia replete orally.  Continue with potassium supplementation while on Lasix   Diabetic neuropathy: Continue with Lyrica Type II Diabetes: Continue with gargling at discharge. Resume Jardiance at discharge.  Plan to hold glipizide and Actos at discharge.  Could resume Actos and oral glipizide depending on CBG Morbid Obesity; Needs life style modification.            Pressure Injury 02/09/21 Buttocks Right Stage 2 -  Partial thickness loss of dermis presenting as a shallow open injury with a red, pink wound bed without slough. (Active)  02/09/21 2059  Location: Buttocks  Location Orientation: Right  Staging: Stage 2 -  Partial thickness loss of dermis presenting as a shallow open injury with a red, pink wound bed without slough.  Wound Description (Comments):   Present on Admission: Yes        Estimated body mass index is 45.63 kg/m as calculated from the following:   Height as of this encounter: 6' (1.829 m).   Weight as of this encounter: 152.6 kg.     Discharge Diagnoses:  Principal Problem:   Cellulitis of right lower extremity Active Problems:   Type 2 diabetes mellitus (HCC)   Hypertension associated with diabetes (HCC)   CKD (chronic kidney disease) stage 3, GFR 30-59 ml/min (HCC)   Chronic atrial fibrillation (HCC)   Chronic diastolic heart failure (HCC)   Hyperlipidemia associated with type 2 diabetes mellitus (HCC)   Pressure injury of skin    Discharge Instructions  Discharge Instructions     Diet - low sodium heart healthy  Complete by: As directed    Discharge wound care:   Complete by: As directed    See above   Increase activity slowly   Complete by: As directed       Allergies as of 02/18/2021       Reactions   Cymbalta [duloxetine Hcl] Other (See Comments)   "loopiness"   Gabapentin Itching        Medication List      STOP taking these medications    glipiZIDE 10 MG 24 hr tablet Commonly known as: GLUCOTROL XL   losartan 100 MG tablet Commonly known as: COZAAR   pioglitazone 30 MG tablet Commonly known as: Actos       TAKE these medications    acetaminophen 325 MG tablet Commonly known as: TYLENOL Take 2 tablets (650 mg total) by mouth every 6 (six) hours as needed for mild pain (or Fever >/= 101). What changed: reasons to take this   albuterol 108 (90 Base) MCG/ACT inhaler Commonly known as: VENTOLIN HFA Inhale 2 puffs into the lungs every 6 (six) hours as needed for wheezing or shortness of breath.   apixaban 5 MG Tabs tablet Commonly known as: Eliquis Take 1 tablet (5 mg total) by mouth 2 (two) times daily.   atorvastatin 40 MG tablet Commonly known as: LIPITOR TAKE 1 TABLET(40 MG) BY MOUTH DAILY What changed:  how much to take how to take this when to take this additional instructions   cetirizine 10 MG tablet Commonly known as: ZYRTEC 1 tab by mouth once daily as needed What changed:  how much to take how to take this when to take this reasons to take this additional instructions   diltiazem 360 MG 24 hr capsule Commonly known as: CARDIZEM CD TAKE 1 CAPSULE(360 MG) BY MOUTH DAILY What changed:  how much to take how to take this when to take this additional instructions   doxycycline 100 MG tablet Commonly known as: VIBRA-TABS Take 1 tablet (100 mg total) by mouth every 12 (twelve) hours for 3 days.   empagliflozin 25 MG Tabs tablet Commonly known as: Jardiance TAKE 1 TABLET BY MOUTH DAILY. What changed:  how much to take how to take this when to take this additional instructions   ferrous sulfate 325 (65 FE) MG tablet Take 1 tablet (325 mg total) by mouth daily.   Fish Oil 1000 MG Caps Take 2 capsules (2,000 mg total) by mouth 2 (two) times daily.   furosemide 40 MG tablet Commonly known as: LASIX Take 1 tablet (40 mg total) by mouth 2 (two)  times daily. What changed:  when to take this additional instructions   insulin glargine-yfgn 100 UNIT/ML injection Commonly known as: SEMGLEE Inject 0.25 mLs (25 Units total) into the skin daily. Start taking on: February 19, 2021   meclizine 12.5 MG tablet Commonly known as: ANTIVERT TAKE 1 TABLET(12.5 MG) BY MOUTH THREE TIMES DAILY AS NEEDED FOR DIZZINESS What changed: See the new instructions.   metoprolol succinate 100 MG 24 hr tablet Commonly known as: TOPROL-XL Take 1 tablet (100 mg total) by mouth daily. Take with or immediately following a meal.   polyethylene glycol 17 g packet Commonly known as: MIRALAX / GLYCOLAX Take 17 g by mouth 2 (two) times daily. What changed:  when to take this reasons to take this   potassium chloride SA 20 MEQ tablet Commonly known as: KLOR-CON Take 2 tablets (40 mEq total) by mouth 2 (two) times daily.   pregabalin  50 MG capsule Commonly known as: LYRICA TAKE 1 CAPSULE(50 MG) BY MOUTH THREE TIMES DAILY What changed:  how much to take how to take this when to take this additional instructions   senna-docusate 8.6-50 MG tablet Commonly known as: Senokot-S Take 1 tablet by mouth 2 (two) times daily as needed for moderate constipation.               Discharge Care Instructions  (From admission, onward)           Start     Ordered   02/18/21 0000  Discharge wound care:       Comments: See above   02/18/21 1257            Allergies  Allergen Reactions   Cymbalta [Duloxetine Hcl] Other (See Comments)    "loopiness"   Gabapentin Itching    Consultations:  None   Procedures/Studies: DG Chest Portable 1 View  Result Date: 02/08/2021 CLINICAL DATA:  Dyspnea EXAM: PORTABLE CHEST 1 VIEW COMPARISON:  12/26/2020, 12/17/2020 FINDINGS: Cardiomegaly with vascular congestion. No focal opacity, pleural effusion or pneumothorax. Atelectasis or scarring in mid lung. IMPRESSION: Cardiomegaly with vascular congestion  Electronically Signed   By: Jasmine PangKim  Fujinaga M.D.   On: 02/08/2021 20:56   VAS US LOWER EXTREMITY VENOUS (DVT) (ONLY [facility] & [facility])  Result Date: 02/09/2021  Lower Venous DVT Study Patient Name:  Pamela GooLAUREN E Cillo  Date of Exam:   02/09/2021 Medical Rec #: [id number]       Accession #:    [id number] Date of Birth: Jul 30, 1955       Patient Gender: F Patient Age:   [age] years [facility] Procedure:      VAS US LOWER EXTREMITY VENOUS (DVT) Referring Phys: JON KNAPP --------------------------------------------------------------------------------  Indications: Pain, and Swelling.  Anticoagulation: Eliquis (missed several doses) Limitations: Body habitus and pain with compression. Comparison Study: No prior study on file Performing Technologist: Sherren Kernsandace Kanady RVS  Examination Guidelines: A complete evaluation includes B-mode imaging, spectral Doppler, color Doppler, and power Doppler as needed of all accessible portions of each vessel. Bilateral testing is considered an integral part of a complete examination. Limited examinations for reoccurring indications may be performed as noted. The reflux portion of the exam is performed with the patient in reverse Trendelenburg.  +---------+---------------+---------+-----------+----------+-------------------+ RIGHT    CompressibilityPhasicitySpontaneityPropertiesThrombus Aging      +---------+---------------+---------+-----------+----------+-------------------+ CFV      Full           Yes      Yes                                      +---------+---------------+---------+-----------+----------+-------------------+ SFJ      Full                                                             +---------+---------------+---------+-----------+----------+-------------------+ FV Prox  Full                                                             +---------+---------------+---------+-----------+----------+-------------------+  FV Mid   Full                                                              +---------+---------------+---------+-----------+----------+-------------------+ FV Distal               Yes      Yes                  patent with color                                                         and Doppler         +---------+---------------+---------+-----------+----------+-------------------+ PFV      Full                                                             +---------+---------------+---------+-----------+----------+-------------------+ POP      Partial        No       No                   Age Indeterminate   +---------+---------------+---------+-----------+----------+-------------------+ PERO                                                  Not well visualized +---------+---------------+---------+-----------+----------+-------------------+ Soleal                                                Not well visualized +---------+---------------+---------+-----------+----------+-------------------+ Gastroc  Full                                                             +---------+---------------+---------+-----------+----------+-------------------+   +----+---------------+---------+-----------+----------+--------------+ LEFTCompressibilityPhasicitySpontaneityPropertiesThrombus Aging +----+---------------+---------+-----------+----------+--------------+ CFV Full           Yes      Yes                                 +----+---------------+---------+-----------+----------+--------------+     Summary: RIGHT: - Findings consistent with age indeterminate deep vein thrombosis involving the right popliteal vein. - A cystic structure is found in the popliteal fossa.  LEFT: - No evidence of common femoral vein obstruction.  *See table(s) above for measurements and observations. Electronically signed by Waverly Ferrari MD on 02/09/2021 at 12:15:10 PM.    Final      Subjective: She is  feeling well, no new complaints.   Discharge Exam: Vitals:  02/18/21 1020 02/18/21 1024  BP: 123/71 123/71  Pulse: 92 92  Resp:  18  Temp:  98 F (36.7 C)  SpO2:  95%     General: Pt is alert, awake, not in acute distress Cardiovascular: RRR, S1/S2 +, no rubs, no gallops Respiratory: CTA bilaterally, no wheezing, no rhonchi Abdominal: Soft, NT, ND, bowel sounds + Extremities: no edema, no cyanosis    The results of significant diagnostics from this hospitalization (including imaging, microbiology, ancillary and laboratory) are listed below for reference.     Microbiology: Recent Results (from the past 240 hour(s))  Resp Panel by RT-PCR (Flu A&B, Covid) Nasopharyngeal Swab     Status: None   Collection Time: 02/08/21  9:20 PM   Specimen: Nasopharyngeal Swab; Nasopharyngeal(NP) swabs in vial transport medium  Result Value Ref Range Status   SARS Coronavirus 2 by RT PCR NEGATIVE NEGATIVE Final    Comment: (NOTE) SARS-CoV-2 target nucleic acids are NOT DETECTED.  The SARS-CoV-2 RNA is generally detectable in upper respiratory specimens during the acute phase of infection. The lowest concentration of SARS-CoV-2 viral copies this assay can detect is 138 copies/mL. A negative result does not preclude SARS-Cov-2 infection and should not be used as the sole basis for treatment or other patient management decisions. A negative result may occur with  improper specimen collection/handling, submission of specimen other than nasopharyngeal swab, presence of viral mutation(s) within the areas targeted by this assay, and inadequate number of viral copies(<138 copies/mL). A negative result must be combined with clinical observations, patient history, and epidemiological information. The expected result is Negative.  Fact Sheet for Patients:  BloggerCourse.com  Fact Sheet for Healthcare Providers:  SeriousBroker.it  This test is no  t yet approved or cleared by the [place] FDA and  has been authorized for detection and/or diagnosis of SARS-CoV-2 by FDA under an Emergency Use Authorization (EUA). This EUA will remain  in effect (meaning this test can be used) for the duration of the COVID-19 declaration under Section 564(b)(1) of the Act, 21 U.S.C.section 360bbb-3(b)(1), unless the authorization is terminated  or revoked sooner.       Influenza A by PCR NEGATIVE NEGATIVE Final   Influenza B by PCR NEGATIVE NEGATIVE Final    Comment: (NOTE) The Xpert Xpress SARS-CoV-2/FLU/RSV plus assay is intended as an aid in the diagnosis of influenza from Nasopharyngeal swab specimens and should not be used as a sole basis for treatment. Nasal washings and aspirates are unacceptable for Xpert Xpress SARS-CoV-2/FLU/RSV testing.  Fact Sheet for Patients: BloggerCourse.com  Fact Sheet for Healthcare Providers: SeriousBroker.it  This test is not yet approved or cleared by the [place] FDA and has been authorized for detection and/or diagnosis of SARS-CoV-2 by FDA under an Emergency Use Authorization (EUA). This EUA will remain in effect (meaning this test can be used) for the duration of the COVID-19 declaration under Section 564(b)(1) of the Act, 21 U.S.C. section 360bbb-3(b)(1), unless the authorization is terminated or revoked.  Performed at [facility], [address], [place], [place] [address]      Labs: BNP (last 3 results) Recent Labs    02/08/21 2113  BNP 210.6*   Basic Metabolic Panel: Recent Labs  Lab 02/14/21 0307 02/15/21 0802 02/16/21 0828 02/17/21 0755 02/18/21 0831  NA 136 133* 134* 136 134*  K 3.4* 3.5 3.4* 3.5 3.2*  CL 97* 98 99 98 95*  CO2 30 28 28 29 30   GLUCOSE 221* 166* 170* 192* 225*  BUN CREATININE 1.11* 1.00 1.05* 0.95 1.24*  CALCIUM 9.2 8.6* 8.6* 9.0 8.9   Liver Function Tests: No results for  input(s): AST, ALT, ALKPHOS, BILITOT, PROT, ALBUMIN in the last 168 hours. No results for input(s): LIPASE, AMYLASE in the last 168 hours. No results for input(s): AMMONIA in the last 168 hours. CBC: Recent Labs  Lab 02/13/21 0142 02/14/21 0307 02/16/21 0828 02/17/21 0755  WBC 10.3 11.1* 12.7* 10.6*  HGB 9.4* 9.8* 9.5* 9.6*  HCT 32.2* 33.0* 32.8* 32.6*  MCV 85.6 85.3 84.8 85.3  PLT 309 286 263 252   Cardiac Enzymes: No results for input(s): CKTOTAL, CKMB, CKMBINDEX, TROPONINI in the last 168 hours. BNP: Invalid input(s): POCBNP CBG: Recent Labs  Lab 02/17/21 1058 02/17/21 1618 02/17/21 2108 02/18/21 0540 02/18/21 1026  GLUCAP 219* 193* 224* 180* 219*   D-Dimer No results for input(s): DDIMER in the last 72 hours. Hgb A1c No results for input(s): HGBA1C in the last 72 hours. Lipid Profile No results for input(s): CHOL, HDL, LDLCALC, TRIG, CHOLHDL, LDLDIRECT in the last 72 hours. Thyroid function studies No results for input(s): TSH, T4TOTAL, T3FREE, THYROIDAB in the last 72 hours.  Invalid input(s): FREET3 Anemia work up No results for input(s): VITAMINB12, FOLATE, FERRITIN, TIBC, IRON, RETICCTPCT in the last 72 hours. Urinalysis    Component Value Date/Time   COLORURINE YELLOW 06/21/2020 1129   APPEARANCEUR CLEAR 06/21/2020 1129   LABSPEC 1.010 06/21/2020 1129   PHURINE 5.5 06/21/2020 1129   GLUCOSEU >=1000 (A) 06/21/2020 1129   HGBUR NEGATIVE 06/21/2020 1129   BILIRUBINUR NEGATIVE 06/21/2020 1129   KETONESUR NEGATIVE 06/21/2020 1129   UROBILINOGEN 0.2 06/21/2020 1129   NITRITE NEGATIVE 06/21/2020 1129   LEUKOCYTESUR NEGATIVE 06/21/2020 1129   Sepsis Labs Invalid input(s): PROCALCITONIN,  WBC,  LACTICIDVEN Microbiology Recent Results (from the past 240 hour(s))  Resp Panel by RT-PCR (Flu A&B, Covid) Nasopharyngeal Swab     Status: None   Collection Time: 02/08/21  9:20 PM   Specimen: Nasopharyngeal Swab; Nasopharyngeal(NP) swabs in vial transport medium   Result Value Ref Range Status   SARS Coronavirus 2 by RT PCR NEGATIVE NEGATIVE Final    Comment: (NOTE) SARS-CoV-2 target nucleic acids are NOT DETECTED.  The SARS-CoV-2 RNA is generally detectable in upper respiratory specimens during the acute phase of infection. The lowest concentration of SARS-CoV-2 viral copies this assay can detect is 138 copies/mL. A negative result does not preclude SARS-Cov-2 infection and should not be used as the sole basis for treatment or other patient management decisions. A negative result may occur with  improper specimen collection/handling, submission of specimen other than nasopharyngeal swab, presence of viral mutation(s) within the areas targeted by this assay, and inadequate number of viral copies(<138 copies/mL). A negative result must be combined with clinical observations, patient history, and epidemiological information. The expected result is Negative.  Fact Sheet for Patients:  BloggerCourse.com  Fact Sheet for Healthcare Providers:  SeriousBroker.it  This test is no t yet approved or cleared by the [place] FDA and  has been authorized for detection and/or diagnosis of SARS-CoV-2 by FDA under an Emergency Use Authorization (EUA). This EUA will remain  in effect (meaning this test can be used) for the duration of the COVID-19 declaration under Section 564(b)(1) of the Act, 21 U.S.C.section 360bbb-3(b)(1), unless the authorization is terminated  or revoked sooner.       Influenza A by PCR NEGATIVE NEGATIVE Final   Influenza B by  PCR NEGATIVE NEGATIVE Final    Comment: (NOTE) The Xpert Xpress SARS-CoV-2/FLU/RSV plus assay is intended as an aid in the diagnosis of influenza from Nasopharyngeal swab specimens and should not be used as a sole basis for treatment. Nasal washings and aspirates are unacceptable for Xpert Xpress SARS-CoV-2/FLU/RSV testing.  Fact Sheet for  Patients: BloggerCourse.com  Fact Sheet for Healthcare Providers: SeriousBroker.it  This test is not yet approved or cleared by the [place] FDA and has been authorized for detection and/or diagnosis of SARS-CoV-2 by FDA under an Emergency Use Authorization (EUA). This EUA will remain in effect (meaning this test can be used) for the duration of the COVID-19 declaration under Section 564(b)(1) of the Act, 21 U.S.C. section 360bbb-3(b)(1), unless the authorization is terminated or revoked.  Performed at [facility], [address], [place], [place] [address]      Time coordinating discharge: 40 minutes  SIGNED:   Alba Cory, MD  [facility]

## 2021-02-18 NOTE — Plan of Care (Signed)
?  Problem: Clinical Measurements: ?Goal: Diagnostic test results will improve ?Outcome: Progressing ?  ?Problem: Safety: ?Goal: Ability to remain free from injury will improve ?Outcome: Progressing ?  ?

## 2021-02-18 NOTE — TOC Progression Note (Addendum)
Transition of Care [facility]) - Progression Note    Patient Details  Name: Pamela [name] MRN: [id number] Date of Birth: [birth year]  Transition of [facility] CM/SW Contact  Ivette Loyal, [place] Phone Number: 02/18/2021, 12:27 PM  Clinical Narrative:    CSW spoke with Doree Fudge at [facility] rehab who says she may be able to accept pt with pending medicare. CSW send pt referral over and will wait for their offer for DC status.  CSW contacted [facility] on the status of rehab offer, Shanda Bumps stated that  pt is max assist and she is so far away from her medicare appointment they don't know if they can chance rehabbing pt with no payment from insurance. Shanda Bumps states she will run it by her supervisor to see if they are able to work around [employer].   Expected Discharge Plan: Skilled Nursing Facility Barriers to Discharge: Continued Medical Work up  Expected Discharge Plan and Services Expected Discharge Plan: Skilled Nursing Facility In-house Referral: Clinical Social Work     Living arrangements for the past 2 months: Single Family Home                                       Social Determinants of Health (SDOH) Interventions    Readmission Risk Interventions No flowsheet data found.

## 2021-02-18 NOTE — Plan of Care (Signed)
  Problem: Clinical Measurements: Goal: Will remain free from infection Outcome: Progressing   Problem: Activity: Goal: Risk for activity intolerance will decrease Outcome: Progressing   

## 2021-02-18 NOTE — Progress Notes (Signed)
Physical Therapy Treatment Patient Details Name: Pamela [name] MRN: [id number] DOB: 1955-08-08 Today's Date: 02/18/2021   History of Present Illness Pt is a 65 y.o. female who presented 02/08/21 with R leg swelling and erythema. Per chart: "Patient was hospitalized 12/17/2020-12/21/2020 for lower GI bleeding related to diverticulosis and stercoral ulcer" and then discharged to SNF then home. Chest x-ray shows cardiomegaly with vascular congestion. Pt admitted with R lower extremiity cellulitis and age indeterminant R leg DVT. PMH: anxiety, permanent a-fib, chronic diastolic CHF, depression, DM2, diastolic dysfunction, HTN, migraines, R knee DJD, rosacea.    PT Comments    Pt continues to make slowed progress towards her goals. Pt currently supervision for bed mobility, once EoB participated in LE exercises and scooting along bed. Pt noted to have more hip clearance with scooting today, so trialed standing with RW x2. Pt was able to fully clear hips on 1st attempt with maxA. On second attempt, pt had almost achieved fully upright with maxA, however R knee buckled and she sat back on bed. D/c plans remain appropriate. PT will continue to follow acutely.     Recommendations for follow up therapy are one component of a multi-disciplinary discharge planning process, led by the attending physician.  Recommendations may be updated based on patient status, additional functional criteria and insurance authorization.  Follow Up Recommendations  SNF;Supervision for mobility/OOB     Equipment Recommendations  Rolling walker with 5" wheels;3in1 (PT);Wheelchair (measurements PT);Wheelchair cushion (measurements PT);Hospital bed;Other (comment) (drop arm for 3in1 and w/c; sliding board; bariatric for all equipment)    Recommendations for Other Services       Precautions / Restrictions Precautions Precautions: Fall Precaution Comments: DVT R leg Restrictions Weight Bearing Restrictions: No      Mobility  Bed Mobility Overal bed mobility: Needs Assistance   Rolling: Supervision   Supine to sit: HOB elevated;Supervision Sit to supine: Supervision   General bed mobility comments: pt only requires supervision for bed mobility    Transfers Overall transfer level: Needs assistance Equipment used: Rolling walker (2 wheeled) Transfers: Lateral/Scoot Transfers;Sit to/from Stand Sit to Stand: From elevated surface;Max assist        Lateral/Scoot Transfers: Min guard General transfer comment: pt used elevated bed, RW and maxA for pulling forward, pt able to clear hips x2 and on last attempt was close to upright when her R knee buckled and she sat back down  Ambulation/Gait             General Gait Details: unable         Balance Overall balance assessment: Needs assistance Sitting-balance support: No upper extremity supported;Feet supported Sitting balance-[name]: Good                                      Cognition Arousal/Alertness: Awake/alert Behavior During Therapy: WFL for tasks assessed/performed Overall Cognitive Status: Within Functional Limits for tasks assessed                                        Exercises General Exercises - Lower Extremity Long Arc Quad: AROM;Both;10 reps;Seated Hip ABduction/ADduction: AROM;Both;10 reps;Seated Hip Flexion/Marching: AROM;Both;10 reps;Seated Toe Raises: AROM;Both;10 reps;Seated Low Level/ICU Exercises Stabilized Bridging: AROM;Both;10 reps;Supine    General Comments General comments (skin integrity, edema, etc.): VSS on RA  Pertinent Vitals/Pain Pain Assessment: No/denies pain     PT Goals (current goals can now be found in the care plan section) Acute Rehab PT Goals Patient Stated Goal: to get stronger PT Goal Formulation: With patient Time For Goal Achievement: 02/23/21 Potential to Achieve Goals: Fair Progress towards PT goals: Progressing toward  goals    Frequency    Min 2X/week      PT Plan Current plan remains appropriate       AM-PAC PT "6 Clicks" Mobility   Outcome Measure  Help needed turning from your back to your side while in a flat bed without using bedrails?: None Help needed moving from lying on your back to sitting on the side of a flat bed without using bedrails?: A Little Help needed moving to and from a bed to a chair (including a wheelchair)?: Total Help needed standing up from a chair using your arms (e.g., wheelchair or bedside chair)?: Total Help needed to walk in hospital room?: Total Help needed climbing 3-5 steps with a railing? : Total 6 Click Score: 11    End of Session Equipment Utilized During Treatment: Gait belt Activity Tolerance: Patient tolerated treatment well Patient left: in bed;with call bell/phone within reach;with bed alarm set Nurse Communication: Mobility status PT Visit Diagnosis: Unsteadiness on feet (R26.81);Muscle weakness (generalized) (M62.81);Difficulty in walking, not elsewhere classified (R26.2);Pain Pain - Right/Left: Right Pain - part of body: Leg     Time: 1312-1340 PT Time Calculation (min) (ACUTE ONLY): 28 min  Charges:  $Therapeutic Exercise: 8-22 mins $Therapeutic Activity: 8-22 mins                     Athaliah Baumbach B. Beverely Risen PT, DPT Acute Rehabilitation Services Pager [phone] Office [phone]    Elon Alas Fleet 02/18/2021, 2:09 PM

## 2021-02-19 DIAGNOSIS — L03115 Cellulitis of right lower limb: Secondary | ICD-10-CM | POA: Diagnosis not present

## 2021-02-19 LAB — GLUCOSE, CAPILLARY
Glucose-Capillary: 171 mg/dL — ABNORMAL HIGH (ref 70–99)
Glucose-Capillary: 205 mg/dL — ABNORMAL HIGH (ref 70–99)
Glucose-Capillary: 225 mg/dL — ABNORMAL HIGH (ref 70–99)
Glucose-Capillary: 172 mg/dL — ABNORMAL HIGH (ref 70–99)

## 2021-02-19 LAB — BASIC METABOLIC PANEL
Anion gap: 8 (ref 5–15)
BUN: 16 mg/dL (ref 8–23)
CO2: 32 mmol/L (ref 22–32)
Calcium: 9 mg/dL (ref 8.9–10.3)
Chloride: 97 mmol/L — ABNORMAL LOW (ref 98–111)
Creatinine, Ser: 0.95 mg/dL (ref 0.44–1.00)
GFR, Estimated: >60 mL/min (ref >60)
Glucose, Bld: 166 mg/dL — ABNORMAL HIGH (ref 70–99)
Potassium: 3.4 mmol/L — ABNORMAL LOW (ref 3.5–5.1)
Sodium: 137 mmol/L (ref 135–145)

## 2021-02-19 MED ORDER — POTASSIUM CHLORIDE CRYS ER 20 MEQ PO TBCR
40.0000 meq | EXTENDED_RELEASE_TABLET | Freq: Once | ORAL | Status: AC
  Administered 2021-02-19: 40 meq via ORAL
  Filled 2021-02-19: qty 2

## 2021-02-19 NOTE — Discharge Summary (Signed)
Physician Discharge Summary  Pamela [name] PXT:[id number] DOB: 1955/09/30 DOA: 02/08/2021  PCP: Corwin Levins, MD  Admit date: 02/08/2021 Discharge date: 02/19/2021  Admitted From: Home  Disposition:  Home   Recommendations for Outpatient Follow-up:  Follow up with PCP in 1-2 weeks Please obtain BMP/CBC in one week Please follow up on the following pending results: adjust Diabetes Medication as needed.  Follow up resolution of Cellulitis.     Discharge Condition: Stable.  CODE STATUS: DNR Diet recommendation: Carb Modified   Brief/Interim Summary: 65 year old with past medical history significant for permanent A. fib on Eliquis, chronic diastolic heart failure, CKD stage IIIa, diabetes type 2, hypertension, hyperlipidemia, depression, anxiety presented to the ED for evaluation of right leg swelling. Recently hospitalized 12/17/2020 until 12/21/2020 for lower GI bleed related to diverticulosis and stercoral ulcer.  She was discharged to SNF.  Patient was sent home from skilled nursing facility on 02/07/2021 as her insurance will not pay for further days, patient continued to have knee and hip problems with generalized debility.  Also she did not get a prescription for Eliquis and had not taken it for 3 days.  On 9/1 she noticed increasing swelling and erythema to her right lower leg and presented for evaluation.  1-Cellulitis of the right lower extremity age indeterminate right leg DVT: -Doppler: age indeterminate right popliteal vein.  -Patient initially improved on Ancef she was transitioned to Keflex on 9/7. Day 10/10 of keflex. DC keflex today. - Started on doxycycline to cover for MRSA, day 5/7, discharge on 3 days.  -Continue with Eliquis. -Continue with Laxis- -She will benefit from skilled rehab -Continue with oral antibiotics. Monitor WBC -WBC trending down.   stable.   2-Permanent A. fib: Continue with Eliquis Continue with diltiazem and Toprol   3-General debility, chronic  bilateral knee pain, right hip pain: Continue with PT. From a skilled rehab   4-CKD stage IIIa: Stable monitor Holding Cozaar. Hypokalemia replete orally.  Continue with potassium supplementation while on Lasix   Diabetic neuropathy: Continue with Lyrica Type II Diabetes: Continue with gargling at discharge. Resume Jardiance at discharge.  Plan to hold glipizide and Actos at discharge.  Could resume Actos and oral glipizide depending on CBG Morbid Obesity; Needs life style modification.     Facility declined her. SW following.        Pressure Injury 02/09/21 Buttocks Right Stage 2 -  Partial thickness loss of dermis presenting as a shallow open injury with a red, pink wound bed without slough. (Active)  02/09/21 2059  Location: Buttocks  Location Orientation: Right  Staging: Stage 2 -  Partial thickness loss of dermis presenting as a shallow open injury with a red, pink wound bed without slough.  Wound Description (Comments):   Present on Admission: Yes        Estimated body mass index is 45.63 kg/m as calculated from the following:   Height as of this encounter: 6' (1.829 m).   Weight as of this encounter: 152.6 kg.     Discharge Diagnoses:  Principal Problem:   Cellulitis of right lower extremity Active Problems:   Type 2 diabetes mellitus (HCC)   Hypertension associated with diabetes (HCC)   CKD (chronic kidney disease) stage 3, GFR 30-59 ml/min (HCC)   Chronic atrial fibrillation (HCC)   Chronic diastolic heart failure (HCC)   Hyperlipidemia associated with type 2 diabetes mellitus (HCC)   Pressure injury of skin    Discharge Instructions  Discharge Instructions  Diet - low sodium heart healthy   Complete by: As directed    Discharge wound care:   Complete by: As directed    See above   Increase activity slowly   Complete by: As directed       Allergies as of 02/19/2021       Reactions   Cymbalta [duloxetine Hcl] Other (See Comments)    "loopiness"   Gabapentin Itching        Medication List     STOP taking these medications    glipiZIDE 10 MG 24 hr tablet Commonly known as: GLUCOTROL XL   losartan 100 MG tablet Commonly known as: COZAAR   pioglitazone 30 MG tablet Commonly known as: Actos       TAKE these medications    acetaminophen 325 MG tablet Commonly known as: TYLENOL Take 2 tablets (650 mg total) by mouth every 6 (six) hours as needed for mild pain (or Fever >/= 101). What changed: reasons to take this   albuterol 108 (90 Base) MCG/ACT inhaler Commonly known as: VENTOLIN HFA Inhale 2 puffs into the lungs every 6 (six) hours as needed for wheezing or shortness of breath.   apixaban 5 MG Tabs tablet Commonly known as: Eliquis Take 1 tablet (5 mg total) by mouth 2 (two) times daily.   atorvastatin 40 MG tablet Commonly known as: LIPITOR TAKE 1 TABLET(40 MG) BY MOUTH DAILY What changed:  how much to take how to take this when to take this additional instructions   cetirizine 10 MG tablet Commonly known as: ZYRTEC 1 tab by mouth once daily as needed What changed:  how much to take how to take this when to take this reasons to take this additional instructions   diltiazem 360 MG 24 hr capsule Commonly known as: CARDIZEM CD TAKE 1 CAPSULE(360 MG) BY MOUTH DAILY What changed:  how much to take how to take this when to take this additional instructions   doxycycline 100 MG tablet Commonly known as: VIBRA-TABS Take 1 tablet (100 mg total) by mouth every 12 (twelve) hours for 3 days.   empagliflozin 25 MG Tabs tablet Commonly known as: Jardiance TAKE 1 TABLET BY MOUTH DAILY. What changed:  how much to take how to take this when to take this additional instructions   ferrous sulfate 325 (65 FE) MG tablet Take 1 tablet (325 mg total) by mouth daily.   Fish Oil 1000 MG Caps Take 2 capsules (2,000 mg total) by mouth 2 (two) times daily.   furosemide 40 MG tablet Commonly  known as: LASIX Take 1 tablet (40 mg total) by mouth 2 (two) times daily. What changed:  when to take this additional instructions   insulin glargine-yfgn 100 UNIT/ML injection Commonly known as: SEMGLEE Inject 0.25 mLs (25 Units total) into the skin daily.   meclizine 12.5 MG tablet Commonly known as: ANTIVERT TAKE 1 TABLET(12.5 MG) BY MOUTH THREE TIMES DAILY AS NEEDED FOR DIZZINESS What changed: See the new instructions.   metoprolol succinate 100 MG 24 hr tablet Commonly known as: TOPROL-XL Take 1 tablet (100 mg total) by mouth daily. Take with or immediately following a meal.   polyethylene glycol 17 g packet Commonly known as: MIRALAX / GLYCOLAX Take 17 g by mouth 2 (two) times daily. What changed:  when to take this reasons to take this   potassium chloride SA 20 MEQ tablet Commonly known as: KLOR-CON Take 2 tablets (40 mEq total) by mouth 2 (two) times daily.  pregabalin 50 MG capsule Commonly known as: LYRICA TAKE 1 CAPSULE(50 MG) BY MOUTH THREE TIMES DAILY What changed:  how much to take how to take this when to take this additional instructions   senna-docusate 8.6-50 MG tablet Commonly known as: Senokot-S Take 1 tablet by mouth 2 (two) times daily as needed for moderate constipation.               Discharge Care Instructions  (From admission, onward)           Start     Ordered   02/18/21 0000  Discharge wound care:       Comments: See above   02/18/21 1257            Allergies  Allergen Reactions   Cymbalta [Duloxetine Hcl] Other (See Comments)    "loopiness"   Gabapentin Itching    Consultations:  None   Procedures/Studies: DG Chest Portable 1 View  Result Date: 02/08/2021 CLINICAL DATA:  Dyspnea EXAM: PORTABLE CHEST 1 VIEW COMPARISON:  12/26/2020, 12/17/2020 FINDINGS: Cardiomegaly with vascular congestion. No focal opacity, pleural effusion or pneumothorax. Atelectasis or scarring in mid lung. IMPRESSION: Cardiomegaly  with vascular congestion Electronically Signed   By: Jasmine Pang M.D.   On: 02/08/2021 20:56   VAS [place] LOWER EXTREMITY VENOUS (DVT) (ONLY [facility] & [facility])  Result Date: 02/09/2021  Lower Venous DVT Study Patient Name:  Pamela [name]  Date of Exam:   02/09/2021 Medical Rec #: [id number]       Accession #:    [id number] Date of Birth: May 20, 1956       Patient Gender: F Patient Age:   51 years Exam Location:  [facility] Procedure:      VAS [place] LOWER EXTREMITY VENOUS (DVT) Referring Phys: JON KNAPP --------------------------------------------------------------------------------  Indications: Pain, and Swelling.  Anticoagulation: Eliquis (missed several doses) Limitations: Body habitus and pain with compression. Comparison Study: No prior study on file Performing Technologist: Sherren Kerns RVS  Examination Guidelines: A complete evaluation includes B-mode imaging, spectral Doppler, color Doppler, and power Doppler as needed of all accessible portions of each vessel. Bilateral testing is considered an integral part of a complete examination. Limited examinations for reoccurring indications may be performed as noted. The reflux portion of the exam is performed with the patient in reverse Trendelenburg.  +---------+---------------+---------+-----------+----------+-------------------+ RIGHT    CompressibilityPhasicitySpontaneityPropertiesThrombus Aging      +---------+---------------+---------+-----------+----------+-------------------+ CFV      Full           Yes      Yes                                      +---------+---------------+---------+-----------+----------+-------------------+ SFJ      Full                                                             +---------+---------------+---------+-----------+----------+-------------------+ FV Prox  Full                                                             +---------+---------------+---------+-----------+----------+-------------------+  FV Mid   Full                                                             +---------+---------------+---------+-----------+----------+-------------------+ FV Distal               Yes      Yes                  patent with color                                                         and Doppler         +---------+---------------+---------+-----------+----------+-------------------+ PFV      Full                                                             +---------+---------------+---------+-----------+----------+-------------------+ POP      Partial        No       No                   Age Indeterminate   +---------+---------------+---------+-----------+----------+-------------------+ PERO                                                  Not well visualized +---------+---------------+---------+-----------+----------+-------------------+ Soleal                                                Not well visualized +---------+---------------+---------+-----------+----------+-------------------+ Gastroc  Full                                                             +---------+---------------+---------+-----------+----------+-------------------+   +----+---------------+---------+-----------+----------+--------------+ LEFTCompressibilityPhasicitySpontaneityPropertiesThrombus Aging +----+---------------+---------+-----------+----------+--------------+ CFV Full           Yes      Yes                                 +----+---------------+---------+-----------+----------+--------------+     Summary: RIGHT: - Findings consistent with age indeterminate deep vein thrombosis involving the right popliteal vein. - A cystic structure is found in the popliteal fossa.  LEFT: - No evidence of common femoral vein obstruction.  *See table(s) above for measurements and observations. Electronically signed by Waverly Ferrari MD on 02/09/2021 at 12:15:10 PM.    Final       Subjective: No new complaint  Discharge Exam: Vitals:   02/19/21 6063 02/19/21 1111  BP: 118/73 116/62  Pulse: 77 96  Resp: 20 20  Temp: 97.9 F (36.6 C) 98.9 F (37.2 C)  SpO2: (!) 88% 94%     General: Pt is alert, awake, not in acute distress Cardiovascular: RRR, S1/S2 +, no rubs, no gallops Respiratory: CTA bilaterally, no wheezing, no rhonchi Abdominal: Soft, NT, ND, bowel sounds + Extremities: right leg redness improved    The results of significant diagnostics from this hospitalization (including imaging, microbiology, ancillary and laboratory) are listed below for reference.     Microbiology: Recent Results (from the past 240 hour(s))  Resp Panel by RT-PCR (Flu A&B, Covid) Nasopharyngeal Swab     Status: None   Collection Time: 02/18/21  2:12 PM   Specimen: Nasopharyngeal Swab; Nasopharyngeal(NP) swabs in vial transport medium  Result Value Ref Range Status   SARS Coronavirus 2 by RT PCR NEGATIVE NEGATIVE Final    Comment: (NOTE) SARS-CoV-2 target nucleic acids are NOT DETECTED.  The SARS-CoV-2 RNA is generally detectable in upper respiratory specimens during the acute phase of infection. The lowest concentration of SARS-CoV-2 viral copies this assay can detect is 138 copies/mL. A negative result does not preclude SARS-Cov-2 infection and should not be used as the sole basis for treatment or other patient management decisions. A negative result may occur with  improper specimen collection/handling, submission of specimen other than nasopharyngeal swab, presence of viral mutation(s) within the areas targeted by this assay, and inadequate number of viral copies(<138 copies/mL). A negative result must be combined with clinical observations, patient history, and epidemiological information. The expected result is Negative.  Fact Sheet for Patients:  BloggerCourse.com  Fact Sheet for Healthcare Providers:   SeriousBroker.it  This test is no t yet approved or cleared by the [place] FDA and  has been authorized for detection and/or diagnosis of SARS-CoV-2 by FDA under an Emergency Use Authorization (EUA). This EUA will remain  in effect (meaning this test can be used) for the duration of the COVID-19 declaration under Section 564(b)(1) of the Act, 21 U.S.C.section 360bbb-3(b)(1), unless the authorization is terminated  or revoked sooner.       Influenza A by PCR NEGATIVE NEGATIVE Final   Influenza B by PCR NEGATIVE NEGATIVE Final    Comment: (NOTE) The Xpert Xpress SARS-CoV-2/FLU/RSV plus assay is intended as an aid in the diagnosis of influenza from Nasopharyngeal swab specimens and should not be used as a sole basis for treatment. Nasal washings and aspirates are unacceptable for Xpert Xpress SARS-CoV-2/FLU/RSV testing.  Fact Sheet for Patients: BloggerCourse.com  Fact Sheet for Healthcare Providers: SeriousBroker.it  This test is not yet approved or cleared by the [place] FDA and has been authorized for detection and/or diagnosis of SARS-CoV-2 by FDA under an Emergency Use Authorization (EUA). This EUA will remain in effect (meaning this test can be used) for the duration of the COVID-19 declaration under Section 564(b)(1) of the Act, 21 U.S.C. section 360bbb-3(b)(1), unless the authorization is terminated or revoked.  Performed at [facility], [address], [place], [place] [address]      Labs: BNP (last 3 results) Recent Labs    02/08/21 2113  BNP 210.6*    Basic Metabolic Panel: Recent Labs  Lab 02/15/21 0802 02/16/21 0828 02/17/21 0755 02/18/21 0831 02/19/21 0823  NA 133* 134* 136 134* 137  K 3.5 3.4* 3.5 3.2* 3.4*  CL 98 99 98 95* 97*  CO2 28 28 29 30  32  GLUCOSE 166* 170* 192* 225* 166*  BUN CREATININE 1.00 1.05* 0.95 1.24* 0.95  CALCIUM  8.6* 8.6* 9.0 8.9 9.0    Liver Function Tests: No results for input(s): AST, ALT, ALKPHOS, BILITOT, PROT, ALBUMIN in the last 168 hours. No results for input(s): LIPASE, AMYLASE in the last 168 hours. No results for input(s): AMMONIA in the last 168 hours. CBC: Recent Labs  Lab 02/13/21 0142 02/14/21 0307 02/16/21 0828 02/17/21 0755  WBC 10.3 11.1* 12.7* 10.6*  HGB 9.4* 9.8* 9.5* 9.6*  HCT 32.2* 33.0* 32.8* 32.6*  MCV 85.6 85.3 84.8 85.3  PLT 309 286 263 252    Cardiac Enzymes: No results for input(s): CKTOTAL, CKMB, CKMBINDEX, TROPONINI in the last 168 hours. BNP: Invalid input(s): POCBNP CBG: Recent Labs  Lab 02/18/21 1026 02/18/21 1544 02/18/21 2010 02/19/21 0637 02/19/21 1112  GLUCAP 219* 225* 224* 171* 205*    D-Dimer No results for input(s): DDIMER in the last 72 hours. Hgb A1c No results for input(s): HGBA1C in the last 72 hours. Lipid Profile No results for input(s): CHOL, HDL, LDLCALC, TRIG, CHOLHDL, LDLDIRECT in the last 72 hours. Thyroid function studies No results for input(s): TSH, T4TOTAL, T3FREE, THYROIDAB in the last 72 hours.  Invalid input(s): FREET3 Anemia work up No results for input(s): VITAMINB12, FOLATE, FERRITIN, TIBC, IRON, RETICCTPCT in the last 72 hours. Urinalysis    Component Value Date/Time   COLORURINE YELLOW 06/21/2020 1129   APPEARANCEUR CLEAR 06/21/2020 1129   LABSPEC 1.010 06/21/2020 1129   PHURINE 5.5 06/21/2020 1129   GLUCOSEU >=1000 (A) 06/21/2020 1129   HGBUR NEGATIVE 06/21/2020 1129   BILIRUBINUR NEGATIVE 06/21/2020 1129   KETONESUR NEGATIVE 06/21/2020 1129   UROBILINOGEN 0.2 06/21/2020 1129   NITRITE NEGATIVE 06/21/2020 1129   LEUKOCYTESUR NEGATIVE 06/21/2020 1129   Sepsis Labs Invalid input(s): PROCALCITONIN,  WBC,  LACTICIDVEN Microbiology Recent Results (from the past 240 hour(s))  Resp Panel by RT-PCR (Flu A&B, Covid) Nasopharyngeal Swab     Status: None   Collection Time: 02/18/21  2:12 PM   Specimen:  Nasopharyngeal Swab; Nasopharyngeal(NP) swabs in vial transport medium  Result Value Ref Range Status   SARS Coronavirus 2 by RT PCR NEGATIVE NEGATIVE Final    Comment: (NOTE) SARS-CoV-2 target nucleic acids are NOT DETECTED.  The SARS-CoV-2 RNA is generally detectable in upper respiratory specimens during the acute phase of infection. The lowest concentration of SARS-CoV-2 viral copies this assay can detect is 138 copies/mL. A negative result does not preclude SARS-Cov-2 infection and should not be used as the sole basis for treatment or other patient management decisions. A negative result may occur with  improper specimen collection/handling, submission of specimen other than nasopharyngeal swab, presence of viral mutation(s) within the areas targeted by this assay, and inadequate number of viral copies(<138 copies/mL). A negative result must be combined with clinical observations, patient history, and epidemiological information. The expected result is Negative.  Fact Sheet for Patients:  BloggerCourse.com  Fact Sheet for Healthcare Providers:  SeriousBroker.it  This test is no t yet approved or cleared by the [place] FDA and  has been authorized for detection and/or diagnosis of SARS-CoV-2 by FDA under an Emergency Use Authorization (EUA). This EUA will remain  in effect (meaning this test can be used) for the duration of the COVID-19 declaration under Section 564(b)(1) of the Act, 21 U.S.C.section 360bbb-3(b)(1), unless the authorization is terminated  or revoked sooner.       Influenza A by PCR NEGATIVE NEGATIVE Final  Influenza B by PCR NEGATIVE NEGATIVE Final    Comment: (NOTE) The Xpert Xpress SARS-CoV-2/FLU/RSV plus assay is intended as an aid in the diagnosis of influenza from Nasopharyngeal swab specimens and should not be used as a sole basis for treatment. Nasal washings and aspirates are unacceptable for  Xpert Xpress SARS-CoV-2/FLU/RSV testing.  Fact Sheet for Patients: BloggerCourse.com  Fact Sheet for Healthcare Providers: SeriousBroker.it  This test is not yet approved or cleared by the [place] FDA and has been authorized for detection and/or diagnosis of SARS-CoV-2 by FDA under an Emergency Use Authorization (EUA). This EUA will remain in effect (meaning this test can be used) for the duration of the COVID-19 declaration under Section 564(b)(1) of the Act, 21 U.S.C. section 360bbb-3(b)(1), unless the authorization is terminated or revoked.  Performed at [facility], [address] [place], [place] [address]      Time coordinating discharge: 40 minutes  SIGNED:   Alba Cory, MD  [facility]

## 2021-02-19 NOTE — Progress Notes (Signed)
Occupational Therapy Treatment Patient Details Name: Pamela [name] MRN: [id number] DOB: August 22, 1955 Today's Date: 02/19/2021   History of present illness Pt is a 65 y.o. female who presented 02/08/21 with R leg swelling and erythema. Per chart: "Patient was hospitalized 12/17/2020-12/21/2020 for lower GI bleeding related to diverticulosis and stercoral ulcer" and then discharged to SNF then home. Chest x-ray shows cardiomegaly with vascular congestion. Pt admitted with R lower extremiity cellulitis and age indeterminant R leg DVT. PMH: anxiety, permanent a-fib, chronic diastolic CHF, depression, DM2, diastolic dysfunction, HTN, migraines, R knee DJD, rosacea.   OT comments  Pt. Was seen for skilled OT to mazimize I and safety with ADLs and mobility. Pt was S with supine to sit and sit ot supine. Pt. Was S with side scoot to head of bed. Pt. Was able to sit eob for there ex and ue adls without loss of balance. Pt. Was able to assist with rolling for peri and le hygiene. Acute ot to follow.    Recommendations for follow up therapy are one component of a multi-disciplinary discharge planning process, led by the attending physician.  Recommendations may be updated based on patient status, additional functional criteria and insurance authorization.    Follow Up Recommendations  SNF    Equipment Recommendations  None recommended by OT    Recommendations for Other Services      Precautions / Restrictions Precautions Precautions: Fall Precaution Comments: DVT R leg Restrictions Weight Bearing Restrictions: No       Mobility Bed Mobility Overal bed mobility: Needs Assistance Bed Mobility: Supine to Sit;Sit to Supine Rolling: Supervision   Supine to sit: HOB elevated;Supervision Sit to supine: Supervision        Transfers                 General transfer comment: Pt. was S with lateral scoot up the bed.    Balance     Sitting balance-[name]: Good                                      ADL either performed or assessed with clinical judgement   ADL Overall ADL's : Needs assistance/impaired Eating/Feeding: Independent   Grooming: Wash/dry hands;Wash/dry face;Oral care;Brushing hair;Sitting   Upper Body Bathing: Minimal assistance;Sitting   Lower Body Bathing: Maximal assistance;Bed level;Sitting/lateral leans   Upper Body Dressing : Minimal assistance;Sitting   Lower Body Dressing: Total assistance;Bed level   Toilet Transfer: Total assistance Toilet Transfer Details (indicate cue type and reason): pt unable Toileting- Clothing Manipulation and Hygiene: Total assistance;Bed level       Functional mobility during ADLs: Total assistance General ADL Comments: Pt. abel to sit eob for adls but needs to lay down for peri care.     Vision   Vision Assessment?: No apparent visual deficits   Perception     Praxis      Cognition Arousal/Alertness: Awake/alert Behavior During Therapy: WFL for tasks assessed/performed Overall Cognitive Status: Within Functional Limits for tasks assessed                                          Exercises Exercises: General Upper Extremity General Exercises - Upper Extremity Shoulder Flexion: AROM;20 reps;Seated Shoulder Extension: AROM;Both;20 reps;Seated Shoulder ABduction: AROM;Both;20 reps;Seated Shoulder ADduction: AROM;20 reps;Seated;Both   Shoulder Instructions  General Comments pt. incontinent of stool and states she often can not feel it.    Pertinent Vitals/ Pain       Pain Assessment: No/denies pain  Home Living                                          Prior Functioning/Environment              Frequency  Min 2X/week        Progress Toward Goals  OT Goals(current goals can now be found in the care plan section)  Progress towards OT goals: Progressing toward goals  Acute Rehab OT Goals Patient Stated Goal: to get  better OT Goal Formulation: With patient Time For Goal Achievement: 02/24/21 Potential to Achieve Goals: Good ADL Goals Pt Will Perform Upper Body Bathing: with set-up;with supervision;sitting Pt Will Perform Lower Body Bathing: with mod assist;sitting/lateral leans;bed level Pt Will Perform Upper Body Dressing: with set-up;sitting Pt Will Perform Lower Body Dressing: with mod assist;with adaptive equipment;bed level;sitting/lateral leans Pt Will Transfer to Toilet: with mod assist;with +2 assist;with transfer board;bedside commode Pt/caregiver will Perform Home Exercise Program: Increased strength;Right Upper extremity;Left upper extremity;With theraband;Independently;With written HEP provided  Plan Discharge plan remains appropriate    Co-evaluation                 AM-PAC OT "6 Clicks" Daily Activity     Outcome Measure   Help from another person eating meals?: None Help from another person taking care of personal grooming?: A Little Help from another person toileting, which includes using toliet, bedpan, or urinal?: A Lot Help from another person bathing (including washing, rinsing, drying)?: A Lot Help from another person to put on and taking off regular upper body clothing?: A Little Help from another person to put on and taking off regular lower body clothing?: Total 6 Click Score: 15    End of Session    OT Visit Diagnosis: Muscle weakness (generalized) (M62.81)   Activity Tolerance Patient tolerated treatment well   Patient Left in bed;with bed alarm set;with call bell/phone within reach   Nurse Communication  (ok therapy)        Time: 6063-0160 OT Time Calculation (min): 41 min  Charges: OT General Charges $OT Visit: 1 Visit OT Treatments $Self Care/Home Management : 23-37 mins $Therapeutic Exercise: 8-22 mins  Derrek Gu OT/L   Anokhi Shannon 02/19/2021, 11:21 AM

## 2021-02-20 DIAGNOSIS — L03115 Cellulitis of right lower limb: Secondary | ICD-10-CM | POA: Diagnosis not present

## 2021-02-20 LAB — GLUCOSE, CAPILLARY
Glucose-Capillary: 176 mg/dL — ABNORMAL HIGH (ref 70–99)
Glucose-Capillary: 218 mg/dL — ABNORMAL HIGH (ref 70–99)
Glucose-Capillary: 169 mg/dL — ABNORMAL HIGH (ref 70–99)
Glucose-Capillary: 225 mg/dL — ABNORMAL HIGH (ref 70–99)

## 2021-02-20 LAB — BASIC METABOLIC PANEL
Anion gap: 7 (ref 5–15)
BUN: 17 mg/dL (ref 8–23)
CO2: 32 mmol/L (ref 22–32)
Calcium: 9.2 mg/dL (ref 8.9–10.3)
Chloride: 96 mmol/L — ABNORMAL LOW (ref 98–111)
Creatinine, Ser: 0.89 mg/dL (ref 0.44–1.00)
GFR, Estimated: >60 mL/min (ref >60)
Glucose, Bld: 203 mg/dL — ABNORMAL HIGH (ref 70–99)
Potassium: 4.1 mmol/L (ref 3.5–5.1)
Sodium: 135 mmol/L (ref 135–145)

## 2021-02-20 MED ORDER — INSULIN GLARGINE-YFGN 100 UNIT/ML ~~LOC~~ SOLN
30.0000 [IU] | Freq: Every day | SUBCUTANEOUS | Status: DC
  Administered 2021-02-20: 30 [IU] via SUBCUTANEOUS
  Filled 2021-02-20 (×2): qty 0.3

## 2021-02-20 NOTE — Progress Notes (Signed)
PROGRESS NOTE    Pamela [name]  MGQ:[id number] DOB: 1956-04-15 DOA: 02/08/2021 PCP: Corwin Levins, MD   Brief Narrative: 65 year old with past medical history significant for permanent A. fib on Eliquis, chronic diastolic heart failure, CKD stage IIIa, diabetes type 2, hypertension, hyperlipidemia, depression, anxiety presented to the ED for evaluation of right leg swelling. Recently hospitalized 12/17/2020 until 12/21/2020 for lower GI bleed related to diverticulosis and stercoral ulcer.  She was discharged to SNF.  Patient was sent home from skilled nursing facility on 02/07/2021 as her insurance will not pay for further days, patient continued to have knee and hip problems with generalized debility.  Also she did not get a prescription for Eliquis and had not taken it for 3 days.  On 9/1 she noticed increasing swelling and erythema to her right lower leg and presented for evaluation.   Assessment & Plan:   Principal Problem:   Cellulitis of right lower extremity Active Problems:   Type 2 diabetes mellitus (HCC)   Hypertension associated with diabetes (HCC)   CKD (chronic kidney disease) stage 3, GFR 30-59 ml/min (HCC)   Chronic atrial fibrillation (HCC)   Chronic diastolic heart failure (HCC)   Hyperlipidemia associated with type 2 diabetes mellitus (HCC)   Pressure injury of skin  1-Cellulitis of the right lower extremity age indeterminate right leg DVT: -Doppler on 02/09/2021, age indeterminate right popliteal vein thrombosis.  Patient was initially started on Ancef and then transitioned to Keflex and received total 10 days of antibiotics.  And then also received 4 days of doxycycline. -Continue with Eliquis. -Continue with Laxis- -She will benefit from skilled rehab  2-Permanent A. fib: Continue with Eliquis Continue with diltiazem and Toprol  3-General debility, chronic bilateral knee pain, right hip pain: Continue with PT. From a skilled rehab  4-CKD stage IIIa: Stable  monitor  Hypokalemia was low yesterday.  We will check labs today.  Diabetic neuropathy: Continue with Lyrica  Type II Diabetes: Blood sugar slightly elevated but will continue with current dose of 30 units of Lantus and SSI.   Pressure Injury 02/09/21 Buttocks Right Stage 2 -  Partial thickness loss of dermis presenting as a shallow open injury with a red, pink wound bed without slough. (Active)  02/09/21 2059  Location: Buttocks  Location Orientation: Right  Staging: Stage 2 -  Partial thickness loss of dermis presenting as a shallow open injury with a red, pink wound bed without slough.  Wound Description (Comments):   Present on Admission: Yes     Estimated body mass index is 45.63 kg/m as calculated from the following:   Height as of this encounter: 6' (1.829 m).   Weight as of this encounter: 152.6 kg.   DVT prophylaxis: Eliquis Code Status: DNR Family Communication: Care discussed with patient.  Disposition Plan:  Status is: Inpatient  Remains inpatient appropriate because:IV treatments appropriate due to intensity of illness or inability to take PO and Inpatient level of care appropriate due to severity of illness  Dispo: The patient is from: Home              Anticipated d/c is to: SNF              Patient currently is medically stable to d/c. Awaiting SNF   Difficult to place patient Yes        Consultants:  None  Procedures:    Antimicrobials:    Subjective: Seen and examined.  No new bodily complaint but just frustrated  that she cannot go to SNF since she ran out of her 60 days this year.   Objective: Vitals:   02/19/21 1111 02/19/21 2001 02/20/21 0618 02/20/21 0735  BP: 116/62 120/71 112/72 107/77  Pulse: 96 85 97 93  Resp: 20 20 20    Temp: 98.9 F (37.2 C) 98.6 F (37 C) 98.6 F (37 C) 98.4 F (36.9 C)  TempSrc: Oral Oral Oral Oral  SpO2: 94% (!) 88% 96% 97%  Weight:   (!) 152.6 kg   Height:        Intake/Output Summary (Last 24  hours) at 02/20/2021 1059 Last data filed at 02/20/2021 1052 Gross per 24 hour  Intake 477 ml  Output 1825 ml  Net -1348 ml    Filed Weights   02/18/21 0539 02/19/21 0633 02/20/21 0618  Weight: (!) 153.7 kg (!) 155.1 kg (!) 152.6 kg    Examination:  General exam: Appears calm and comfortable  Respiratory system: Clear to auscultation. Respiratory effort normal. Cardiovascular system: S1 & S2 heard, RRR. No JVD, murmurs, rubs, gallops or clicks.  +2 bilateral lower extremity pitting edema Gastrointestinal system: Abdomen is nondistended, soft and nontender. No organomegaly or masses felt. Normal bowel sounds heard. Central nervous system: Alert and oriented. No focal neurological deficits. Extremities: Symmetric 5 x 5 power. Skin: Very minimal hyperpigmentation at the right lower extremity. Psychiatry: Judgement and insight appear normal. Mood & affect appropriate.    Data Reviewed: I have personally reviewed following labs and imaging studies  CBC: Recent Labs  Lab 02/14/21 0307 02/16/21 0828 02/17/21 0755  WBC 11.1* 12.7* 10.6*  HGB 9.8* 9.5* 9.6*  HCT 33.0* 32.8* 32.6*  MCV 85.3 84.8 85.3  PLT 286 263 252    Basic Metabolic Panel: Recent Labs  Lab 02/15/21 0802 02/16/21 0828 02/17/21 0755 02/18/21 0831 02/19/21 0823  NA 133* 134* 136 134* 137  K 3.5 3.4* 3.5 3.2* 3.4*  CL 98 99 98 95* 97*  CO2 28 28 29 30  32  GLUCOSE 166* 170* 192* 225* 166*  BUN 12 12 13 17 16   CREATININE 1.00 1.05* 0.95 1.24* 0.95  CALCIUM 8.6* 8.6* 9.0 8.9 9.0    GFR: Estimated Creatinine Clearance: 99.1 mL/min (by C-G formula based on SCr of 0.95 mg/dL). Liver Function Tests: No results for input(s): AST, ALT, ALKPHOS, BILITOT, PROT, ALBUMIN in the last 168 hours.  No results for input(s): LIPASE, AMYLASE in the last 168 hours. No results for input(s): AMMONIA in the last 168 hours. Coagulation Profile: No results for input(s): INR, PROTIME in the last 168 hours. Cardiac  Enzymes: No results for input(s): CKTOTAL, CKMB, CKMBINDEX, TROPONINI in the last 168 hours. BNP (last 3 results) Recent Labs    03/20/20 1145 06/21/20 1129  PROBNP 125.0* 139.0*    HbA1C: No results for input(s): HGBA1C in the last 72 hours. CBG: Recent Labs  Lab 02/19/21 0637 02/19/21 1112 02/19/21 1630 02/19/21 2108 02/20/21 0550  GLUCAP 171* 205* 225* 172* 176*    Lipid Profile: No results for input(s): CHOL, HDL, LDLCALC, TRIG, CHOLHDL, LDLDIRECT in the last 72 hours. Thyroid Function Tests: No results for input(s): TSH, T4TOTAL, FREET4, T3FREE, THYROIDAB in the last 72 hours. Anemia Panel: No results for input(s): VITAMINB12, FOLATE, FERRITIN, TIBC, IRON, RETICCTPCT in the last 72 hours. Sepsis Labs: No results for input(s): PROCALCITON, LATICACIDVEN in the last 168 hours.  Recent Results (from the past 240 hour(s))  Resp Panel by RT-PCR (Flu A&B, Covid) Nasopharyngeal Swab  Status: None   Collection Time: 02/18/21  2:12 PM   Specimen: Nasopharyngeal Swab; Nasopharyngeal(NP) swabs in vial transport medium  Result Value Ref Range Status   SARS Coronavirus 2 by RT PCR NEGATIVE NEGATIVE Final    Comment: (NOTE) SARS-CoV-2 target nucleic acids are NOT DETECTED.  The SARS-CoV-2 RNA is generally detectable in upper respiratory specimens during the acute phase of infection. The lowest concentration of SARS-CoV-2 viral copies this assay can detect is 138 copies/mL. A negative result does not preclude SARS-Cov-2 infection and should not be used as the sole basis for treatment or other patient management decisions. A negative result may occur with  improper specimen collection/handling, submission of specimen other than nasopharyngeal swab, presence of viral mutation(s) within the areas targeted by this assay, and inadequate number of viral copies(<138 copies/mL). A negative result must be combined with clinical observations, patient history, and  epidemiological information. The expected result is Negative.  Fact Sheet for Patients:  BloggerCourse.com  Fact Sheet for Healthcare Providers:  SeriousBroker.it  This test is no t yet approved or cleared by the [place] FDA and  has been authorized for detection and/or diagnosis of SARS-CoV-2 by FDA under an Emergency Use Authorization (EUA). This EUA will remain  in effect (meaning this test can be used) for the duration of the COVID-19 declaration under Section 564(b)(1) of the Act, 21 U.S.C.section 360bbb-3(b)(1), unless the authorization is terminated  or revoked sooner.       Influenza A by PCR NEGATIVE NEGATIVE Final   Influenza B by PCR NEGATIVE NEGATIVE Final    Comment: (NOTE) The Xpert Xpress SARS-CoV-2/FLU/RSV plus assay is intended as an aid in the diagnosis of influenza from Nasopharyngeal swab specimens and should not be used as a sole basis for treatment. Nasal washings and aspirates are unacceptable for Xpert Xpress SARS-CoV-2/FLU/RSV testing.  Fact Sheet for Patients: BloggerCourse.com  Fact Sheet for Healthcare Providers: SeriousBroker.it  This test is not yet approved or cleared by the [place] FDA and has been authorized for detection and/or diagnosis of SARS-CoV-2 by FDA under an Emergency Use Authorization (EUA). This EUA will remain in effect (meaning this test can be used) for the duration of the COVID-19 declaration under Section 564(b)(1) of the Act, 21 U.S.C. section 360bbb-3(b)(1), unless the authorization is terminated or revoked.  Performed at [facility], [address] [place], [place] [address]           Radiology Studies: No results found.      Scheduled Meds:  apixaban  5 mg Oral BID   atorvastatin  40 mg Oral Daily   diltiazem  360 mg Oral Daily   ferrous sulfate  325 mg Oral Q24H   furosemide  40 mg  Oral BID   Gerhardt's butt cream   Topical BID   insulin aspart  0-9 Units Subcutaneous TID WC   insulin aspart  3 Units Subcutaneous TID WC   insulin glargine-yfgn  30 Units Subcutaneous Daily   metoprolol succinate  100 mg Oral Daily   potassium chloride  40 mEq Oral Daily   pregabalin  50 mg Oral TID   sodium chloride flush  3 mL Intravenous Q12H   Continuous Infusions:   LOS: 8 days    Time spent: 35 minutes  Hughie Closs, MD [facility]   If 7PM-7AM, please contact night-coverage [email or web address]  02/20/2021, 10:59 AM

## 2021-02-20 NOTE — Plan of Care (Signed)
  Problem: Clinical Measurements: Goal: Respiratory complications will improve Outcome: Progressing   

## 2021-02-21 DIAGNOSIS — L03115 Cellulitis of right lower limb: Secondary | ICD-10-CM | POA: Diagnosis not present

## 2021-02-21 LAB — CBC WITH DIFFERENTIAL/PLATELET
Abs Immature Granulocytes: 0.08 10*3/uL — ABNORMAL HIGH (ref 0.00–0.07)
Basophils Absolute: 0.1 10*3/uL (ref 0.0–0.1)
Basophils Relative: 1 %
Eosinophils Absolute: 0.2 10*3/uL (ref 0.0–0.5)
Eosinophils Relative: 2 %
HCT: 33.7 % — ABNORMAL LOW (ref 36.0–46.0)
Hemoglobin: 10 g/dL — ABNORMAL LOW (ref 12.0–15.0)
Immature Granulocytes: 1 %
Lymphocytes Relative: 16 %
Lymphs Abs: 1.5 10*3/uL (ref 0.7–4.0)
MCH: 25.2 pg — ABNORMAL LOW (ref 26.0–34.0)
MCHC: 29.7 g/dL — ABNORMAL LOW (ref 30.0–36.0)
MCV: 84.9 fL (ref 80.0–100.0)
Monocytes Absolute: 0.7 10*3/uL (ref 0.1–1.0)
Monocytes Relative: 8 %
Neutro Abs: 6.8 10*3/uL (ref 1.7–7.7)
Neutrophils Relative %: 72 %
Platelets: 271 10*3/uL (ref 150–400)
RBC: 3.97 MIL/uL (ref 3.87–5.11)
RDW: 17.5 % — ABNORMAL HIGH (ref 11.5–15.5)
WBC: 9.4 10*3/uL (ref 4.0–10.5)
nRBC: 0 % (ref 0.0–0.2)

## 2021-02-21 LAB — PROCALCITONIN: Procalcitonin: 35.37 ng/mL

## 2021-02-21 LAB — BASIC METABOLIC PANEL
Anion gap: 11 (ref 5–15)
BUN: 16 mg/dL (ref 8–23)
CO2: 31 mmol/L (ref 22–32)
Calcium: 9.3 mg/dL (ref 8.9–10.3)
Chloride: 93 mmol/L — ABNORMAL LOW (ref 98–111)
Creatinine, Ser: 1.07 mg/dL — ABNORMAL HIGH (ref 0.44–1.00)
GFR, Estimated: 58 mL/min — ABNORMAL LOW (ref >60)
Glucose, Bld: 220 mg/dL — ABNORMAL HIGH (ref 70–99)
Potassium: 3.7 mmol/L (ref 3.5–5.1)
Sodium: 135 mmol/L (ref 135–145)

## 2021-02-21 LAB — GLUCOSE, CAPILLARY
Glucose-Capillary: 195 mg/dL — ABNORMAL HIGH (ref 70–99)
Glucose-Capillary: 206 mg/dL — ABNORMAL HIGH (ref 70–99)
Glucose-Capillary: 201 mg/dL — ABNORMAL HIGH (ref 70–99)
Glucose-Capillary: 192 mg/dL — ABNORMAL HIGH (ref 70–99)

## 2021-02-21 MED ORDER — INSULIN GLARGINE-YFGN 100 UNIT/ML ~~LOC~~ SOLN
35.0000 [IU] | Freq: Every day | SUBCUTANEOUS | Status: DC
  Administered 2021-02-21 – 2021-02-23 (×3): 35 [IU] via SUBCUTANEOUS
  Filled 2021-02-21 (×3): qty 0.35

## 2021-02-21 NOTE — Progress Notes (Addendum)
CSW spoke with Pamela [name] of [facility] who states the facility is at capacity for DTP patient's at this time. Unfortunately, patient does not have any SNF days remaining with her current insurance plan - patient is eligible for Medicare benefits on her 65th birthday which is in 9 days, however she does not have an appointment with Medicare staff until October 7th.  CSW spoke with Pamela [name] at [facility] who states they are willing to take the patient once she has a pending Medicare number.  CSW spoke with Pamela [name] at [facility] who states that enrollment in Medicare benefits is done through the [employer]  CSW spoke with Pamela [name] at [employer] who states she must speak directly with the patient directly to change her appointment for benefit enrollment.  CSW spoke with patient at bedside to provide her with an update regarding discharge planning. CSW informed patient of efforts to change her appointment for benefit enrollment and provided her with the contact number for [facility]. Patient agreeable to contact [facility] to attempt to move her appointment date up so that she can be discharged to rehab. Patient agreeable for placement at [facility] once the insurance issues are resolved.  Pamela [name], MSW, LCSW Transitions of Care  Clinical Social Worker II [phone]

## 2021-02-21 NOTE — Progress Notes (Signed)
PROGRESS NOTE    Pamela [name]  HER:[id number] DOB: Jan 21, 1956 DOA: 02/08/2021 PCP: Corwin Levins, MD   Brief Narrative: 65 year old with past medical history significant for permanent A. fib on Eliquis, chronic diastolic heart failure, CKD stage IIIa, diabetes type 2, hypertension, hyperlipidemia, depression, anxiety presented to the ED for evaluation of right leg swelling. Recently hospitalized 12/17/2020 until 12/21/2020 for lower GI bleed related to diverticulosis and stercoral ulcer.  She was discharged to SNF.  Patient was sent home from skilled nursing facility on 02/07/2021 as her insurance will not pay for further days, patient continued to have knee and hip problems with generalized debility.  Also she did not get a prescription for Eliquis and had not taken it for 3 days.  On 9/1 she noticed increasing swelling and erythema to her right lower leg and presented for evaluation.   Assessment & Plan:   Principal Problem:   Cellulitis of right lower extremity Active Problems:   Type 2 diabetes mellitus (HCC)   Hypertension associated with diabetes (HCC)   CKD (chronic kidney disease) stage 3, GFR 30-59 ml/min (HCC)   Chronic atrial fibrillation (HCC)   Chronic diastolic heart failure (HCC)   Hyperlipidemia associated with type 2 diabetes mellitus (HCC)   Pressure injury of skin  1-Cellulitis of the right lower extremity age indeterminate right leg DVT: -Doppler on 02/09/2021, age indeterminate right popliteal vein thrombosis.  Patient was initially started on Ancef and then transitioned to Keflex and received total 10 days of antibiotics.  And then also received 4 days of doxycycline.  Patient does seem to have very mild erythema which is warm to touch and tender to palpation in the right lower extremity however she has remained afebrile and leukocytosis in fact have resolved so does not meet any other criteria for cellulitis so as of now, although procalcitonin 35.37 but no other signs of  infection and no prior procalcitonin available to compare the baseline.Continue to watch closely without antibiotics. -Continue with Eliquis. -Continue with Laxis- -She will benefit from skilled rehab  2-Permanent A. fib: Continue with Eliquis Continue with diltiazem and Toprol  3-General debility, chronic bilateral knee pain, right hip pain: Continue with PT. From a skilled rehab  4-CKD stage IIIa: Stable monitor  Hypokalemia solved.  Diabetic neuropathy: Continue with Lyrica  Type II Diabetes: Blood sugar slightly elevated.  Increase Lantus to 35 units and continue SSI.   Pressure Injury 02/09/21 Buttocks Right Stage 2 -  Partial thickness loss of dermis presenting as a shallow open injury with a red, pink wound bed without slough. (Active)  02/09/21 2059  Location: Buttocks  Location Orientation: Right  Staging: Stage 2 -  Partial thickness loss of dermis presenting as a shallow open injury with a red, pink wound bed without slough.  Wound Description (Comments):   Present on Admission: Yes     Estimated body mass index is 45.66 kg/m as calculated from the following:   Height as of this encounter: 6' (1.829 m).   Weight as of this encounter: 152.7 kg.   DVT prophylaxis: Eliquis Code Status: DNR Family Communication: Care discussed with patient.  Disposition Plan:  Status is: Inpatient  Remains inpatient appropriate because:IV treatments appropriate due to intensity of illness or inability to take PO and Inpatient level of care appropriate due to severity of illness  Dispo: The patient is from: Home              Anticipated d/c is to: SNF  Patient currently is medically stable to d/c. Awaiting SNF   Difficult to place patient Yes        Consultants:  None  Procedures:    Antimicrobials:    Subjective: Patient seen and examined.  She has no complaints.  She is just frustrated for being in the hospital so long.   Objective: Vitals:    02/20/21 1244 02/20/21 2000 02/21/21 0500 02/21/21 1204  BP: 96/69 118/66 107/69 108/73  Pulse: 80 77  89  Resp:  19 18 18   Temp:  98.9 F (37.2 C) 98.6 F (37 C) 98 F (36.7 C)  TempSrc:  Oral Oral   SpO2: 93% 92% 97% 94%  Weight:   (!) 152.7 kg   Height:        Intake/Output Summary (Last 24 hours) at 02/21/2021 1205 Last data filed at 02/21/2021 0731 Gross per 24 hour  Intake 480 ml  Output 2150 ml  Net -1670 ml    Filed Weights   02/19/21 0633 02/20/21 0618 02/21/21 0500  Weight: (!) 155.1 kg (!) 152.6 kg (!) 152.7 kg    Examination:  General exam: Appears calm and comfortable, morbidly obese Respiratory system: Clear to auscultation. Respiratory effort normal. Cardiovascular system: S1 & S2 heard, RRR. No JVD, murmurs, rubs, gallops or clicks. No pedal edema. Gastrointestinal system: Abdomen is nondistended, soft and nontender. No organomegaly or masses felt. Normal bowel sounds heard. Central nervous system: Alert and oriented. No focal neurological deficits. Extremities: Symmetric 5 x 5 power. Skin: Unsure whether she has mild erythema or hyperpigmentation to the right lower extremity.  Slightly warm to touch and tender to palpation as well. Psychiatry: Judgement and insight appear normal. Mood & affect appropriate.    Data Reviewed: I have personally reviewed following labs and imaging studies  CBC: Recent Labs  Lab 02/16/21 0828 02/17/21 0755 02/21/21 0822  WBC 12.7* 10.6* 9.4  NEUTROABS  --   --  6.8  HGB 9.5* 9.6* 10.0*  HCT 32.8* 32.6* 33.7*  MCV 84.8 85.3 84.9  PLT 263 252 271    Basic Metabolic Panel: Recent Labs  Lab 02/17/21 0755 02/18/21 0831 02/19/21 0823 02/20/21 1214 02/21/21 0822  NA 136 134* 137 135 135  K 3.5 3.2* 3.4* 4.1 3.7  CL 98 95* 97* 96* 93*  CO2 29 30 32 32 31  GLUCOSE 192* 225* 166* 203* 220*  BUN 13 17 16 17 16   CREATININE 0.95 1.24* 0.95 0.89 1.07*  CALCIUM 9.0 8.9 9.0 9.2 9.3    GFR: Estimated Creatinine  Clearance: 88 mL/min (A) (by C-G formula based on SCr of 1.07 mg/dL (H)). Liver Function Tests: No results for input(s): AST, ALT, ALKPHOS, BILITOT, PROT, ALBUMIN in the last 168 hours.  No results for input(s): LIPASE, AMYLASE in the last 168 hours. No results for input(s): AMMONIA in the last 168 hours. Coagulation Profile: No results for input(s): INR, PROTIME in the last 168 hours. Cardiac Enzymes: No results for input(s): CKTOTAL, CKMB, CKMBINDEX, TROPONINI in the last 168 hours. BNP (last 3 results) Recent Labs    03/20/20 1145 06/21/20 1129  PROBNP 125.0* 139.0*    HbA1C: No results for input(s): HGBA1C in the last 72 hours. CBG: Recent Labs  Lab 02/20/21 0550 02/20/21 1111 02/20/21 1532 02/20/21 2055 02/21/21 0521  GLUCAP 176* 218* 169* 225* 195*    Lipid Profile: No results for input(s): CHOL, HDL, LDLCALC, TRIG, CHOLHDL, LDLDIRECT in the last 72 hours. Thyroid Function Tests: No results for input(s): TSH,  T4TOTAL, FREET4, T3FREE, THYROIDAB in the last 72 hours. Anemia Panel: No results for input(s): VITAMINB12, FOLATE, FERRITIN, TIBC, IRON, RETICCTPCT in the last 72 hours. Sepsis Labs: Recent Labs  Lab 02/21/21 3329  PROCALCITON 35.37    Recent Results (from the past 240 hour(s))  Resp Panel by RT-PCR (Flu A&B, Covid) Nasopharyngeal Swab     Status: None   Collection Time: 02/18/21  2:12 PM   Specimen: Nasopharyngeal Swab; Nasopharyngeal(NP) swabs in vial transport medium  Result Value Ref Range Status   SARS Coronavirus 2 by RT PCR NEGATIVE NEGATIVE Final    Comment: (NOTE) SARS-CoV-2 target nucleic acids are NOT DETECTED.  The SARS-CoV-2 RNA is generally detectable in upper respiratory specimens during the acute phase of infection. The lowest concentration of SARS-CoV-2 viral copies this assay can detect is 138 copies/mL. A negative result does not preclude SARS-Cov-2 infection and should not be used as the sole basis for treatment or other  patient management decisions. A negative result may occur with  improper specimen collection/handling, submission of specimen other than nasopharyngeal swab, presence of viral mutation(s) within the areas targeted by this assay, and inadequate number of viral copies(<138 copies/mL). A negative result must be combined with clinical observations, patient history, and epidemiological information. The expected result is Negative.  Fact Sheet for Patients:  BloggerCourse.com  Fact Sheet for Healthcare Providers:  SeriousBroker.it  This test is no t yet approved or cleared by the [place] FDA and  has been authorized for detection and/or diagnosis of SARS-CoV-2 by FDA under an Emergency Use Authorization (EUA). This EUA will remain  in effect (meaning this test can be used) for the duration of the COVID-19 declaration under Section 564(b)(1) of the Act, 21 U.S.C.section 360bbb-3(b)(1), unless the authorization is terminated  or revoked sooner.       Influenza A by PCR NEGATIVE NEGATIVE Final   Influenza B by PCR NEGATIVE NEGATIVE Final    Comment: (NOTE) The Xpert Xpress SARS-CoV-2/FLU/RSV plus assay is intended as an aid in the diagnosis of influenza from Nasopharyngeal swab specimens and should not be used as a sole basis for treatment. Nasal washings and aspirates are unacceptable for Xpert Xpress SARS-CoV-2/FLU/RSV testing.  Fact Sheet for Patients: BloggerCourse.com  Fact Sheet for Healthcare Providers: SeriousBroker.it  This test is not yet approved or cleared by the [place] FDA and has been authorized for detection and/or diagnosis of SARS-CoV-2 by FDA under an Emergency Use Authorization (EUA). This EUA will remain in effect (meaning this test can be used) for the duration of the COVID-19 declaration under Section 564(b)(1) of the Act, 21 U.S.C. section  360bbb-3(b)(1), unless the authorization is terminated or revoked.  Performed at [facility], [address], [place], [place] [address]     Radiology Studies: No results found.  Scheduled Meds:  apixaban  5 mg Oral BID   atorvastatin  40 mg Oral Daily   diltiazem  360 mg Oral Daily   ferrous sulfate  325 mg Oral Q24H   furosemide  40 mg Oral BID   Gerhardt's butt cream   Topical BID   insulin aspart  0-9 Units Subcutaneous TID WC   insulin aspart  3 Units Subcutaneous TID WC   insulin glargine-yfgn  35 Units Subcutaneous Daily   metoprolol succinate  100 mg Oral Daily   potassium chloride  40 mEq Oral Daily   pregabalin  50 mg Oral TID   sodium chloride flush  3 mL Intravenous Q12H  Continuous Infusions:   LOS: 9 days   Time spent: 30 minutes  Hughie Closs, MD [facility]   If 7PM-7AM, please contact night-coverage [email or web address]  02/21/2021, 12:05 PM

## 2021-02-21 NOTE — Progress Notes (Signed)
Physical Therapy Treatment Patient Details Name: Pamela [name] MRN: [id number] DOB: [birth year] Today's Date: 02/21/2021   History of Present Illness Pt is a 65 y.o. female who presented 02/08/21 with R leg swelling and erythema. Per chart: "Patient was hospitalized 12/17/2020-12/21/2020 for lower GI bleeding related to diverticulosis and stercoral ulcer" and then discharged to SNF then home. Chest x-ray shows cardiomegaly with vascular congestion. Pt admitted with R lower extremiity cellulitis and age indeterminant R leg DVT. PMH: anxiety, permanent a-fib, chronic diastolic CHF, depression, DM2, diastolic dysfunction, HTN, migraines, R knee DJD, rosacea.    PT Comments    Pt eager to work with therapy on standing up today, however continues to be limited in safe mobility by difficulty with sequencing standing in presence of decreased strength. Pt supervision for coming to [facility] where she was able to complete seated therex prior to standing. Pt with 4x attempts to come to standing with maxAx2, multimodal cuing for pelvic rotation and glute activation to reach fully upright. Pt unable to bring trunk to upright. Pt continues to make progress towards her goals. D/c recommendations remain appropriate. PT will continue to follow acutely.     Recommendations for follow up therapy are one component of a multi-disciplinary discharge planning process, led by the attending physician.  Recommendations may be updated based on patient status, additional functional criteria and insurance authorization.  Follow Up Recommendations  SNF;Supervision for mobility/OOB     Equipment Recommendations  Rolling walker with 5" wheels;3in1 (PT);Wheelchair (measurements PT);Wheelchair cushion (measurements PT);Hospital bed;Other (comment) (drop arm for 3in1 and w/c; sliding board; bariatric for all equipment)    Recommendations for Other Services       Precautions / Restrictions Precautions Precautions: Fall Precaution  Comments: DVT R leg Restrictions Weight Bearing Restrictions: No     Mobility  Bed Mobility Overal bed mobility: Needs Assistance   Rolling: Supervision   Supine to sit: HOB elevated;Supervision Sit to supine: Supervision   General bed mobility comments: pt only requires supervision for bed mobility    Transfers Overall transfer level: Needs assistance Equipment used: Rolling walker (2 wheeled) Transfers: Lateral/Scoot Transfers;Sit to/from Stand Sit to Stand: From elevated surface;Max assist;+2 physical assistance        Lateral/Scoot Transfers: Min guard General transfer comment: pt used elevated bed, RW and maxA for pulling forward, pt able to clear hips x2 and on last attempt was close to upright when her R knee buckled and she sat back down  Ambulation/Gait             General Gait Details: unable         Balance Overall balance assessment: Needs assistance Sitting-balance support: No upper extremity supported;Feet supported Sitting balance-[name]: Good                                      Cognition Arousal/Alertness: Awake/alert Behavior During Therapy: WFL for tasks assessed/performed Overall Cognitive Status: Within Functional Limits for tasks assessed                                        Exercises General Exercises - Lower Extremity Gluteal Sets: AROM;Both;15 reps Long Arc Quad: AROM;Both;10 reps;Seated Hip ABduction/ADduction: AROM;Both;10 reps;Seated Hip Flexion/Marching: AROM;Both;10 reps;Seated Toe Raises: AROM;Both;10 reps;Seated    General Comments  Pt frustrated by limited communication about  her d/c plans.      Pertinent Vitals/Pain Pain Assessment: No/denies pain     PT Goals (current goals can now be found in the care plan section) Acute Rehab PT Goals Patient Stated Goal: to get stronger PT Goal Formulation: With patient Time For Goal Achievement: 02/23/21 Potential to Achieve Goals:  Fair Progress towards PT goals: Progressing toward goals    Frequency    Min 2X/week      PT Plan Current plan remains appropriate       AM-PAC PT "6 Clicks" Mobility   Outcome Measure  Help needed turning from your back to your side while in a flat bed without using bedrails?: None Help needed moving from lying on your back to sitting on the side of a flat bed without using bedrails?: A Little Help needed moving to and from a bed to a chair (including a wheelchair)?: Total Help needed standing up from a chair using your arms (e.g., wheelchair or bedside chair)?: Total Help needed to walk in hospital room?: Total Help needed climbing 3-5 steps with a railing? : Total 6 Click Score: 11    End of Session Equipment Utilized During Treatment: Gait belt Activity Tolerance: Patient tolerated treatment well Patient left: in bed;with call bell/phone within reach;with bed alarm set Nurse Communication: Mobility status PT Visit Diagnosis: Unsteadiness on feet (R26.81);Muscle weakness (generalized) (M62.81);Difficulty in walking, not elsewhere classified (R26.2);Pain Pain - Right/Left: Right Pain - part of body: Leg     Time: 0950-1016 PT Time Calculation (min) (ACUTE ONLY): 26 min  Charges:  $Therapeutic Exercise: 8-22 mins $Therapeutic Activity: 8-22 mins                     Evangelynn Lochridge B. Beverely Risen PT, DPT Acute Rehabilitation Services Pager [phone] Office [phone]    Elon Alas Fleet 02/21/2021, 10:51 AM

## 2021-02-22 DIAGNOSIS — L03115 Cellulitis of right lower limb: Secondary | ICD-10-CM | POA: Diagnosis not present

## 2021-02-22 LAB — GLUCOSE, CAPILLARY
Glucose-Capillary: 191 mg/dL — ABNORMAL HIGH (ref 70–99)
Glucose-Capillary: 196 mg/dL — ABNORMAL HIGH (ref 70–99)
Glucose-Capillary: 203 mg/dL — ABNORMAL HIGH (ref 70–99)
Glucose-Capillary: 248 mg/dL — ABNORMAL HIGH (ref 70–99)

## 2021-02-22 NOTE — Progress Notes (Signed)
PROGRESS NOTE    GLADYCE [name]  UXL:[id number] DOB: 30-Jul-1955 DOA: 02/08/2021 PCP: Corwin Levins, MD   Brief Narrative: 65 year old with past medical history significant for permanent A. fib on Eliquis, chronic diastolic heart failure, CKD stage IIIa, diabetes type 2, hypertension, hyperlipidemia, depression, anxiety presented to the ED for evaluation of right leg swelling. Recently hospitalized 12/17/2020 until 12/21/2020 for lower GI bleed related to diverticulosis and stercoral ulcer.  She was discharged to SNF.  Patient was sent home from skilled nursing facility on 02/07/2021 as her insurance will not pay for further days, patient continued to have knee and hip problems with generalized debility.  Also she did not get a prescription for Eliquis and had not taken it for 3 days.  On 9/1 she noticed increasing swelling and erythema to her right lower leg and presented for evaluation.   Assessment & Plan:   Principal Problem:   Cellulitis of right lower extremity Active Problems:   Type 2 diabetes mellitus (HCC)   Hypertension associated with diabetes (HCC)   CKD (chronic kidney disease) stage 3, GFR 30-59 ml/min (HCC)   Chronic atrial fibrillation (HCC)   Chronic diastolic heart failure (HCC)   Hyperlipidemia associated with type 2 diabetes mellitus (HCC)   Pressure injury of skin  1-Cellulitis of the right lower extremity age indeterminate right leg DVT: -Doppler on 02/09/2021, age indeterminate right popliteal vein thrombosis.  Patient was initially started on Ancef and then transitioned to Keflex and received total 10 days of antibiotics.  And then also received 4 days of doxycycline.  Patient does seem to have very mild erythema which is warm to touch and tender to palpation in the right lower extremity however she has remained afebrile and leukocytosis in fact have resolved so does not meet any other criteria for cellulitis so as of now, although procalcitonin 35.37 but no other signs of  infection and no prior procalcitonin available to compare the baseline.  In fact, erythema is slightly improved and so is tenderness and warmth today.  Continue to watch closely without antibiotics. -Continue with Eliquis. -Continue with Laxis- -She will benefit from skilled rehab  2-Permanent A. fib: Continue with Eliquis Continue with diltiazem and Toprol  3-General debility, chronic bilateral knee pain, right hip pain: Continue with PT. From a skilled rehab  4-CKD stage IIIa: Stable monitor  Hypokalemia solved.  Diabetic neuropathy: Continue with Lyrica  Type II Diabetes: Blood sugar slightly elevated.  Continue Lantus to 35 units and continue SSI.  Reassess tomorrow   Pressure Injury 02/09/21 Buttocks Right Stage 2 -  Partial thickness loss of dermis presenting as a shallow open injury with a red, pink wound bed without slough. (Active)  02/09/21 2059  Location: Buttocks  Location Orientation: Right  Staging: Stage 2 -  Partial thickness loss of dermis presenting as a shallow open injury with a red, pink wound bed without slough.  Wound Description (Comments):   Present on Admission: Yes     Estimated body mass index is 45.05 kg/m as calculated from the following:   Height as of this encounter: 6' (1.829 m).   Weight as of this encounter: 150.7 kg.   DVT prophylaxis: Eliquis Code Status: DNR Family Communication: Care discussed with patient.  Disposition Plan:  Status is: Inpatient  Remains inpatient appropriate because:IV treatments appropriate due to intensity of illness or inability to take PO and Inpatient level of care appropriate due to severity of illness  Dispo: The patient is from: Home  Anticipated d/c is to: SNF              Patient currently is medically stable to d/c. Awaiting SNF   Difficult to place patient Yes        Consultants:  None  Procedures:    Antimicrobials:    Subjective: Seen and examined.  No  complaints   Objective: Vitals:   02/21/21 2008 02/22/21 0052 02/22/21 0420 02/22/21 0916  BP: 120/71  104/64 107/74  Pulse: 95  83 97  Resp: 18  18 20   Temp: 98.7 F (37.1 C)  97.8 F (36.6 C) 98.5 F (36.9 C)  TempSrc: Oral  Oral Oral  SpO2: 92%  96% 95%  Weight:  (!) 150.7 kg    Height:        Intake/Output Summary (Last 24 hours) at 02/22/2021 1041 Last data filed at 02/22/2021 02/24/2021 Gross per 24 hour  Intake 440 ml  Output 2800 ml  Net -2360 ml    Filed Weights   02/20/21 0618 02/21/21 0500 02/22/21 0052  Weight: (!) 152.6 kg (!) 152.7 kg (!) 150.7 kg    Examination:  General exam: Appears calm and comfortable  Respiratory system: Clear to auscultation. Respiratory effort normal. Cardiovascular system: S1 & S2 heard, RRR. No JVD, murmurs, rubs, gallops or clicks. No pedal edema. Gastrointestinal system: Abdomen is nondistended, soft and nontender. No organomegaly or masses felt. Normal bowel sounds heard. Central nervous system: Alert and oriented. No focal neurological deficits. Extremities: Symmetric 5 x 5 power. Skin: Mild erythema at the right lower. Psychiatry: Judgement and insight appear normal. Mood & affect appropriate.   Data Reviewed: I have personally reviewed following labs and imaging studies  CBC: Recent Labs  Lab 02/16/21 0828 02/17/21 0755 02/21/21 0822  WBC 12.7* 10.6* 9.4  NEUTROABS  --   --  6.8  HGB 9.5* 9.6* 10.0*  HCT 32.8* 32.6* 33.7*  MCV 84.8 85.3 84.9  PLT 263 252 271    Basic Metabolic Panel: Recent Labs  Lab 02/17/21 0755 02/18/21 0831 02/19/21 0823 02/20/21 1214 02/21/21 0822  NA 136 134* 137 135 135  K 3.5 3.2* 3.4* 4.1 3.7  CL 98 95* 97* 96* 93*  CO2 29 30 32 32 31  GLUCOSE 192* 225* 166* 203* 220*  BUN 13 17 16 17 16   CREATININE 0.95 1.24* 0.95 0.89 1.07*  CALCIUM 9.0 8.9 9.0 9.2 9.3    GFR: Estimated Creatinine Clearance: 87.3 mL/min (A) (by C-G formula based on SCr of 1.07 mg/dL (H)). Liver Function  Tests: No results for input(s): AST, ALT, ALKPHOS, BILITOT, PROT, ALBUMIN in the last 168 hours.  No results for input(s): LIPASE, AMYLASE in the last 168 hours. No results for input(s): AMMONIA in the last 168 hours. Coagulation Profile: No results for input(s): INR, PROTIME in the last 168 hours. Cardiac Enzymes: No results for input(s): CKTOTAL, CKMB, CKMBINDEX, TROPONINI in the last 168 hours. BNP (last 3 results) Recent Labs    03/20/20 1145 06/21/20 1129  PROBNP 125.0* 139.0*    HbA1C: No results for input(s): HGBA1C in the last 72 hours. CBG: Recent Labs  Lab 02/21/21 0521 02/21/21 1204 02/21/21 1628 02/21/21 2109 02/22/21 0539  GLUCAP 195* 206* 201* 192* 191*    Lipid Profile: No results for input(s): CHOL, HDL, LDLCALC, TRIG, CHOLHDL, LDLDIRECT in the last 72 hours. Thyroid Function Tests: No results for input(s): TSH, T4TOTAL, FREET4, T3FREE, THYROIDAB in the last 72 hours. Anemia Panel: No results for input(s): VITAMINB12, FOLATE,  FERRITIN, TIBC, IRON, RETICCTPCT in the last 72 hours. Sepsis Labs: Recent Labs  Lab 02/21/21 0350  PROCALCITON 35.37     Recent Results (from the past 240 hour(s))  Resp Panel by RT-PCR (Flu A&B, Covid) Nasopharyngeal Swab     Status: None   Collection Time: 02/18/21  2:12 PM   Specimen: Nasopharyngeal Swab; Nasopharyngeal(NP) swabs in vial transport medium  Result Value Ref Range Status   SARS Coronavirus 2 by RT PCR NEGATIVE NEGATIVE Final    Comment: (NOTE) SARS-CoV-2 target nucleic acids are NOT DETECTED.  The SARS-CoV-2 RNA is generally detectable in upper respiratory specimens during the acute phase of infection. The lowest concentration of SARS-CoV-2 viral copies this assay can detect is 138 copies/mL. A negative result does not preclude SARS-Cov-2 infection and should not be used as the sole basis for treatment or other patient management decisions. A negative result may occur with  improper specimen  collection/handling, submission of specimen other than nasopharyngeal swab, presence of viral mutation(s) within the areas targeted by this assay, and inadequate number of viral copies(<138 copies/mL). A negative result must be combined with clinical observations, patient history, and epidemiological information. The expected result is Negative.  Fact Sheet for Patients:  BloggerCourse.com  Fact Sheet for Healthcare Providers:  SeriousBroker.it  This test is no t yet approved or cleared by the [place] FDA and  has been authorized for detection and/or diagnosis of SARS-CoV-2 by FDA under an Emergency Use Authorization (EUA). This EUA will remain  in effect (meaning this test can be used) for the duration of the COVID-19 declaration under Section 564(b)(1) of the Act, 21 U.S.C.section 360bbb-3(b)(1), unless the authorization is terminated  or revoked sooner.       Influenza A by PCR NEGATIVE NEGATIVE Final   Influenza B by PCR NEGATIVE NEGATIVE Final    Comment: (NOTE) The Xpert Xpress SARS-CoV-2/FLU/RSV plus assay is intended as an aid in the diagnosis of influenza from Nasopharyngeal swab specimens and should not be used as a sole basis for treatment. Nasal washings and aspirates are unacceptable for Xpert Xpress SARS-CoV-2/FLU/RSV testing.  Fact Sheet for Patients: BloggerCourse.com  Fact Sheet for Healthcare Providers: SeriousBroker.it  This test is not yet approved or cleared by the [place] FDA and has been authorized for detection and/or diagnosis of SARS-CoV-2 by FDA under an Emergency Use Authorization (EUA). This EUA will remain in effect (meaning this test can be used) for the duration of the COVID-19 declaration under Section 564(b)(1) of the Act, 21 U.S.C. section 360bbb-3(b)(1), unless the authorization is terminated or revoked.  Performed at [facility], [address] [place], [place] [address]     Radiology Studies: No results found.  Scheduled Meds:  apixaban  5 mg Oral BID   atorvastatin  40 mg Oral Daily   diltiazem  360 mg Oral Daily   ferrous sulfate  325 mg Oral Q24H   furosemide  40 mg Oral BID   Gerhardt's butt cream   Topical BID   insulin aspart  0-9 Units Subcutaneous TID WC   insulin aspart  3 Units Subcutaneous TID WC   insulin glargine-yfgn  35 Units Subcutaneous Daily   metoprolol succinate  100 mg Oral Daily   potassium chloride  40 mEq Oral Daily   pregabalin  50 mg Oral TID   sodium chloride flush  3 mL Intravenous Q12H   Continuous Infusions:   LOS: 10 days   Time spent: 25 minutes  Daleen Bo  Yamilet Mcfayden, MD [facility]   If 7PM-7AM, please contact night-coverage [email or web address]  02/22/2021, 10:41 AM

## 2021-02-22 NOTE — Progress Notes (Signed)
CSW spoke with patient who states she made contact with [facility] who accepted and entered into the system to initiate the change in appointment date. Patient was told to return call on Monday as the change takes at least 48 hours to be reflected in the system.  Edwin Dada, MSW, LCSW Transitions of Care  Clinical Social Worker II [phone]

## 2021-02-22 NOTE — Plan of Care (Signed)

## 2021-02-23 DIAGNOSIS — L03115 Cellulitis of right lower limb: Secondary | ICD-10-CM | POA: Diagnosis not present

## 2021-02-23 LAB — GLUCOSE, CAPILLARY
Glucose-Capillary: 210 mg/dL — ABNORMAL HIGH (ref 70–99)
Glucose-Capillary: 257 mg/dL — ABNORMAL HIGH (ref 70–99)
Glucose-Capillary: 213 mg/dL — ABNORMAL HIGH (ref 70–99)
Glucose-Capillary: 215 mg/dL — ABNORMAL HIGH (ref 70–99)

## 2021-02-23 MED ORDER — INSULIN GLARGINE-YFGN 100 UNIT/ML ~~LOC~~ SOLN
10.0000 [IU] | Freq: Once | SUBCUTANEOUS | Status: AC
  Administered 2021-02-23: 10 [IU] via SUBCUTANEOUS
  Filled 2021-02-23: qty 0.1

## 2021-02-23 MED ORDER — INSULIN GLARGINE-YFGN 100 UNIT/ML ~~LOC~~ SOLN
45.0000 [IU] | Freq: Every day | SUBCUTANEOUS | Status: DC
  Filled 2021-02-23: qty 0.45

## 2021-02-23 NOTE — Progress Notes (Signed)
   02/23/21 0906  Assess: MEWS Score  BP 111/62  Pulse Rate 77  Resp 20  SpO2 95 %  O2 Device Room Air  Assess: MEWS Score  MEWS Temp 0  MEWS Systolic 0  MEWS Pulse 0  MEWS RR 0  MEWS LOC 0  MEWS Score 0  MEWS Score Color Green  Assess: if the MEWS score is Yellow or Red  Were vital signs taken at a resting state? Yes  Focused Assessment No change from prior assessment  Early Detection of Sepsis Score *See Row Information* Low  MEWS guidelines implemented *See Row Information* No, vital signs rechecked

## 2021-02-23 NOTE — Progress Notes (Signed)
PROGRESS NOTE    Pamela [name]  GGY:[id number] DOB: 1955/09/26 DOA: 02/08/2021 PCP: Corwin Levins, MD   Brief Narrative: 65 year old with past medical history significant for permanent A. fib on Eliquis, chronic diastolic heart failure, CKD stage IIIa, diabetes type 2, hypertension, hyperlipidemia, depression, anxiety presented to the ED for evaluation of right leg swelling. Recently hospitalized 12/17/2020 until 12/21/2020 for lower GI bleed related to diverticulosis and stercoral ulcer.  She was discharged to SNF.  Patient was sent home from skilled nursing facility on 02/07/2021 as her insurance will not pay for further days, patient continued to have knee and hip problems with generalized debility.  Also she did not get a prescription for Eliquis and had not taken it for 3 days.  On 9/1 she noticed increasing swelling and erythema to her right lower leg and presented for evaluation.   Assessment & Plan:   Principal Problem:   Cellulitis of right lower extremity Active Problems:   Type 2 diabetes mellitus (HCC)   Hypertension associated with diabetes (HCC)   CKD (chronic kidney disease) stage 3, GFR 30-59 ml/min (HCC)   Chronic atrial fibrillation (HCC)   Chronic diastolic heart failure (HCC)   Hyperlipidemia associated with type 2 diabetes mellitus (HCC)   Pressure injury of skin  1-Cellulitis of the right lower extremity age indeterminate right leg DVT: -Doppler on 02/09/2021, age indeterminate right popliteal vein thrombosis.  Patient was initially started on Ancef and then transitioned to Keflex and received total 10 days of antibiotics.  And then also received 4 days of doxycycline.  Erythema improving while off of antibiotics -Continue with Eliquis. -Continue with Laxis- -She will benefit from skilled rehab  2-Permanent A. fib: Continue with Eliquis Continue with diltiazem and Toprol  3-General debility, chronic bilateral knee pain, right hip pain: Continue with PT. From a  skilled rehab  4-CKD stage IIIa: Stable monitor  Hypokalemia solved.  Diabetic neuropathy: Continue with Lyrica  Type II Diabetes: Blood sugar slightly elevated.  Increase Semglee to 45 units and continue SSI   Pressure Injury 02/09/21 Buttocks Right Stage 2 -  Partial thickness loss of dermis presenting as a shallow open injury with a red, pink wound bed without slough. (Active)  02/09/21 2059  Location: Buttocks  Location Orientation: Right  Staging: Stage 2 -  Partial thickness loss of dermis presenting as a shallow open injury with a red, pink wound bed without slough.  Wound Description (Comments):   Present on Admission: Yes     Estimated body mass index is 45.72 kg/m as calculated from the following:   Height as of this encounter: 6' (1.829 m).   Weight as of this encounter: 152.9 kg.   DVT prophylaxis: Eliquis Code Status: DNR Family Communication: Care discussed with patient.  Disposition Plan:  Status is: Inpatient  Remains inpatient appropriate because:IV treatments appropriate due to intensity of illness or inability to take PO and Inpatient level of care appropriate due to severity of illness  Dispo: The patient is from: Home              Anticipated d/c is to: SNF              Patient currently is medically stable to d/c. Awaiting SNF   Difficult to place patient Yes        Consultants:  None  Procedures:    Antimicrobials:    Subjective: Patient seen and examined.  She has no complaints   Objective: Vitals:   02/23/21  8295 02/23/21 0742 02/23/21 0906 02/23/21 1218  BP: 111/62 112/62 111/62 (!) 98/58  Pulse: 89 (!) 116 77 77  Resp: 19  20 20   Temp: 97.9 F (36.6 C) 98.9 F (37.2 C)  98.3 F (36.8 C)  TempSrc: Oral Oral  Oral  SpO2: 94% 95% 95% 95%  Weight: (!) 152.9 kg     Height:        Intake/Output Summary (Last 24 hours) at 02/23/2021 1432 Last data filed at 02/23/2021 1255 Gross per 24 hour  Intake 1900 ml  Output 3100 ml   Net -1200 ml    Filed Weights   02/21/21 0500 02/22/21 0052 02/23/21 0539  Weight: (!) 152.7 kg (!) 150.7 kg (!) 152.9 kg    Examination:  General exam: Appears calm and comfortable  Respiratory system: Clear to auscultation. Respiratory effort normal. Cardiovascular system: S1 & S2 heard, RRR. No JVD, murmurs, rubs, gallops or clicks. No pedal edema. Gastrointestinal system: Abdomen is nondistended, soft and nontender. No organomegaly or masses felt. Normal bowel sounds heard. Central nervous system: Alert and oriented. No focal neurological deficits. Extremities: Symmetric 5 x 5 power. Skin: Minimal erythema right lower extremity. Psychiatry: Judgement and insight appear normal. Mood & affect appropriate.    Data Reviewed: I have personally reviewed following labs and imaging studies  CBC: Recent Labs  Lab 02/17/21 0755 02/21/21 0822  WBC 10.6* 9.4  NEUTROABS  --  6.8  HGB 9.6* 10.0*  HCT 32.6* 33.7*  MCV 85.3 84.9  PLT 252 271    Basic Metabolic Panel: Recent Labs  Lab 02/17/21 0755 02/18/21 0831 02/19/21 0823 02/20/21 1214 02/21/21 0822  NA 136 134* 137 135 135  K 3.5 3.2* 3.4* 4.1 3.7  CL 98 95* 97* 96* 93*  CO2 29 30 32 32 31  GLUCOSE 192* 225* 166* 203* 220*  BUN 13 17 16 17 16   CREATININE 0.95 1.24* 0.95 0.89 1.07*  CALCIUM 9.0 8.9 9.0 9.2 9.3    GFR: Estimated Creatinine Clearance: 88 mL/min (A) (by C-G formula based on SCr of 1.07 mg/dL (H)). Liver Function Tests: No results for input(s): AST, ALT, ALKPHOS, BILITOT, PROT, ALBUMIN in the last 168 hours.  No results for input(s): LIPASE, AMYLASE in the last 168 hours. No results for input(s): AMMONIA in the last 168 hours. Coagulation Profile: No results for input(s): INR, PROTIME in the last 168 hours. Cardiac Enzymes: No results for input(s): CKTOTAL, CKMB, CKMBINDEX, TROPONINI in the last 168 hours. BNP (last 3 results) Recent Labs    03/20/20 1145 06/21/20 1129  PROBNP 125.0* 139.0*     HbA1C: No results for input(s): HGBA1C in the last 72 hours. CBG: Recent Labs  Lab 02/22/21 1046 02/22/21 1640 02/22/21 2144 02/23/21 0603 02/23/21 1216  GLUCAP 196* 203* 248* 210* 257*    Lipid Profile: No results for input(s): CHOL, HDL, LDLCALC, TRIG, CHOLHDL, LDLDIRECT in the last 72 hours. Thyroid Function Tests: No results for input(s): TSH, T4TOTAL, FREET4, T3FREE, THYROIDAB in the last 72 hours. Anemia Panel: No results for input(s): VITAMINB12, FOLATE, FERRITIN, TIBC, IRON, RETICCTPCT in the last 72 hours. Sepsis Labs: Recent Labs  Lab 02/21/21 0822  PROCALCITON 35.37     Recent Results (from the past 240 hour(s))  Resp Panel by RT-PCR (Flu A&B, Covid) Nasopharyngeal Swab     Status: None   Collection Time: 02/18/21  2:12 PM   Specimen: Nasopharyngeal Swab; Nasopharyngeal(NP) swabs in vial transport medium  Result Value Ref Range Status   SARS  Coronavirus 2 by RT PCR NEGATIVE NEGATIVE Final    Comment: (NOTE) SARS-CoV-2 target nucleic acids are NOT DETECTED.  The SARS-CoV-2 RNA is generally detectable in upper respiratory specimens during the acute phase of infection. The lowest concentration of SARS-CoV-2 viral copies this assay can detect is 138 copies/mL. A negative result does not preclude SARS-Cov-2 infection and should not be used as the sole basis for treatment or other patient management decisions. A negative result may occur with  improper specimen collection/handling, submission of specimen other than nasopharyngeal swab, presence of viral mutation(s) within the areas targeted by this assay, and inadequate number of viral copies(<138 copies/mL). A negative result must be combined with clinical observations, patient history, and epidemiological information. The expected result is Negative.  Fact Sheet for Patients:  BloggerCourse.com  Fact Sheet for Healthcare Providers:   SeriousBroker.it  This test is no t yet approved or cleared by the [place] FDA and  has been authorized for detection and/or diagnosis of SARS-CoV-2 by FDA under an Emergency Use Authorization (EUA). This EUA will remain  in effect (meaning this test can be used) for the duration of the COVID-19 declaration under Section 564(b)(1) of the Act, 21 U.S.C.section 360bbb-3(b)(1), unless the authorization is terminated  or revoked sooner.       Influenza A by PCR NEGATIVE NEGATIVE Final   Influenza B by PCR NEGATIVE NEGATIVE Final    Comment: (NOTE) The Xpert Xpress SARS-CoV-2/FLU/RSV plus assay is intended as an aid in the diagnosis of influenza from Nasopharyngeal swab specimens and should not be used as a sole basis for treatment. Nasal washings and aspirates are unacceptable for Xpert Xpress SARS-CoV-2/FLU/RSV testing.  Fact Sheet for Patients: BloggerCourse.com  Fact Sheet for Healthcare Providers: SeriousBroker.it  This test is not yet approved or cleared by the [place] FDA and has been authorized for detection and/or diagnosis of SARS-CoV-2 by FDA under an Emergency Use Authorization (EUA). This EUA will remain in effect (meaning this test can be used) for the duration of the COVID-19 declaration under Section 564(b)(1) of the Act, 21 U.S.C. section 360bbb-3(b)(1), unless the authorization is terminated or revoked.  Performed at [facility], [address], [place], [place] [address]     Radiology Studies: No results found.  Scheduled Meds:  apixaban  5 mg Oral BID   atorvastatin  40 mg Oral Daily   diltiazem  360 mg Oral Daily   ferrous sulfate  325 mg Oral Q24H   furosemide  40 mg Oral BID   Gerhardt's butt cream   Topical BID   insulin aspart  0-9 Units Subcutaneous TID WC   insulin aspart  3 Units Subcutaneous TID WC   insulin glargine-yfgn  35 Units Subcutaneous  Daily   metoprolol succinate  100 mg Oral Daily   potassium chloride  40 mEq Oral Daily   pregabalin  50 mg Oral TID   sodium chloride flush  3 mL Intravenous Q12H   Continuous Infusions:   LOS: 11 days   Time spent: 24 minutes  Hughie Closs, MD [facility]   If 7PM-7AM, please contact night-coverage [email or web address]  02/23/2021, 2:32 PM

## 2021-02-24 DIAGNOSIS — L03115 Cellulitis of right lower limb: Secondary | ICD-10-CM | POA: Diagnosis not present

## 2021-02-24 LAB — GLUCOSE, CAPILLARY
Glucose-Capillary: 184 mg/dL — ABNORMAL HIGH (ref 70–99)
Glucose-Capillary: 173 mg/dL — ABNORMAL HIGH (ref 70–99)
Glucose-Capillary: 176 mg/dL — ABNORMAL HIGH (ref 70–99)

## 2021-02-24 MED ORDER — INSULIN GLARGINE-YFGN 100 UNIT/ML ~~LOC~~ SOLN
55.0000 [IU] | Freq: Every day | SUBCUTANEOUS | Status: DC
  Administered 2021-02-24 – 2021-02-25 (×2): 55 [IU] via SUBCUTANEOUS
  Filled 2021-02-24 (×2): qty 0.55

## 2021-02-24 NOTE — Progress Notes (Signed)
PROGRESS NOTE    Pamela [name]  WUJ:[id number] DOB: Feb 12, 1956 DOA: 02/08/2021 PCP: Corwin Levins, MD   Brief Narrative: 65 year old with past medical history significant for permanent A. fib on Eliquis, chronic diastolic heart failure, CKD stage IIIa, diabetes type 2, hypertension, hyperlipidemia, depression, anxiety presented to the ED for evaluation of right leg swelling. Recently hospitalized 12/17/2020 until 12/21/2020 for lower GI bleed related to diverticulosis and stercoral ulcer.  She was discharged to SNF.  Patient was sent home from skilled nursing facility on 02/07/2021 as her insurance will not pay for further days, patient continued to have knee and hip problems with generalized debility.  Also she did not get a prescription for Eliquis and had not taken it for 3 days.  On 9/1 she noticed increasing swelling and erythema to her right lower leg and presented for evaluation.   Assessment & Plan:   Principal Problem:   Cellulitis of right lower extremity Active Problems:   Type 2 diabetes mellitus (HCC)   Hypertension associated with diabetes (HCC)   CKD (chronic kidney disease) stage 3, GFR 30-59 ml/min (HCC)   Chronic atrial fibrillation (HCC)   Chronic diastolic heart failure (HCC)   Hyperlipidemia associated with type 2 diabetes mellitus (HCC)   Pressure injury of skin  1-Cellulitis of the right lower extremity age indeterminate right leg DVT: -Doppler on 02/09/2021, age indeterminate right popliteal vein thrombosis.  Patient was initially started on Ancef and then transitioned to Keflex and received total 10 days of antibiotics.  And then also received 4 days of doxycycline.  Erythema improving while off of antibiotics -Continue with Eliquis. -Continue with Laxis- -She will benefit from skilled rehab  2-Permanent A. fib: Continue with Eliquis Continue with diltiazem and Toprol  3-General debility, chronic bilateral knee pain, right hip pain: Continue with PT. From a  skilled rehab  4-CKD stage IIIa: Stable monitor  Hypokalemia: resolved.  Diabetic neuropathy: Continue with Lyrica  Type II Diabetes: Blood sugar still elevated.  Increase Semglee to 55 units and continue SSI.   Pressure Injury 02/09/21 Buttocks Right Stage 2 -  Partial thickness loss of dermis presenting as a shallow open injury with a red, pink wound bed without slough. (Active)  02/09/21 2059  Location: Buttocks  Location Orientation: Right  Staging: Stage 2 -  Partial thickness loss of dermis presenting as a shallow open injury with a red, pink wound bed without slough.  Wound Description (Comments):   Present on Admission: Yes     Estimated body mass index is 45.78 kg/m as calculated from the following:   Height as of this encounter: 6' (1.829 m).   Weight as of this encounter: 153.1 kg.   DVT prophylaxis: Eliquis Code Status: DNR Family Communication: Care discussed with patient.  Disposition Plan:  Status is: Inpatient  Remains inpatient appropriate because:IV treatments appropriate due to intensity of illness or inability to take PO and Inpatient level of care appropriate due to severity of illness  Dispo: The patient is from: Home              Anticipated d/c is to: SNF              Patient currently is medically stable to d/c. Awaiting SNF   Difficult to place patient Yes        Consultants:  None  Procedures:    Antimicrobials:    Subjective: Seen and examined.  No complaints   Objective: Vitals:   02/24/21 0259 02/24/21 0300  02/24/21 1036 02/24/21 1103  BP: 114/63  (!) 101/58 (!) 101/57  Pulse: 90  75 68  Resp: 16   18  Temp: 98.3 F (36.8 C)   98.3 F (36.8 C)  TempSrc: Oral     SpO2: 96%   91%  Weight:  (!) 153.1 kg    Height:        Intake/Output Summary (Last 24 hours) at 02/24/2021 1117 Last data filed at 02/24/2021 0806 Gross per 24 hour  Intake 1550 ml  Output 1900 ml  Net -350 ml    Filed Weights   02/22/21 0052  02/23/21 0539 02/24/21 0300  Weight: (!) 150.7 kg (!) 152.9 kg (!) 153.1 kg    Examination:  General exam: Appears calm and comfortable  Respiratory system: Clear to auscultation. Respiratory effort normal. Cardiovascular system: S1 & S2 heard, RRR. No JVD, murmurs, rubs, gallops or clicks. No pedal edema. Gastrointestinal system: Abdomen is nondistended, soft and nontender. No organomegaly or masses felt. Normal bowel sounds heard. Central nervous system: Alert and oriented. No focal neurological deficits. Extremities: Symmetric 5 x 5 power. Skin: No rashes, lesions or ulcers.  Psychiatry: Judgement and insight appear normal. Mood & affect appropriate.    Data Reviewed: I have personally reviewed following labs and imaging studies  CBC: Recent Labs  Lab 02/21/21 0822  WBC 9.4  NEUTROABS 6.8  HGB 10.0*  HCT 33.7*  MCV 84.9  PLT 271    Basic Metabolic Panel: Recent Labs  Lab 02/18/21 0831 02/19/21 0823 02/20/21 1214 02/21/21 0822  NA 134* 137 135 135  K 3.2* 3.4* 4.1 3.7  CL 95* 97* 96* 93*  CO2 30 32 32 31  GLUCOSE 225* 166* 203* 220*  BUN 17 16 17 16   CREATININE 1.24* 0.95 0.89 1.07*  CALCIUM 8.9 9.0 9.2 9.3    GFR: Estimated Creatinine Clearance: 88.1 mL/min (A) (by C-G formula based on SCr of 1.07 mg/dL (H)). Liver Function Tests: No results for input(s): AST, ALT, ALKPHOS, BILITOT, PROT, ALBUMIN in the last 168 hours.  No results for input(s): LIPASE, AMYLASE in the last 168 hours. No results for input(s): AMMONIA in the last 168 hours. Coagulation Profile: No results for input(s): INR, PROTIME in the last 168 hours. Cardiac Enzymes: No results for input(s): CKTOTAL, CKMB, CKMBINDEX, TROPONINI in the last 168 hours. BNP (last 3 results) Recent Labs    03/20/20 1145 06/21/20 1129  PROBNP 125.0* 139.0*    HbA1C: No results for input(s): HGBA1C in the last 72 hours. CBG: Recent Labs  Lab 02/23/21 1216 02/23/21 1711 02/23/21 2058 02/24/21 0556  02/24/21 1102  GLUCAP 257* 213* 215* 184* 173*    Lipid Profile: No results for input(s): CHOL, HDL, LDLCALC, TRIG, CHOLHDL, LDLDIRECT in the last 72 hours. Thyroid Function Tests: No results for input(s): TSH, T4TOTAL, FREET4, T3FREE, THYROIDAB in the last 72 hours. Anemia Panel: No results for input(s): VITAMINB12, FOLATE, FERRITIN, TIBC, IRON, RETICCTPCT in the last 72 hours. Sepsis Labs: Recent Labs  Lab 02/21/21 02/23/21  PROCALCITON 35.37     Recent Results (from the past 240 hour(s))  Resp Panel by RT-PCR (Flu A&B, Covid) Nasopharyngeal Swab     Status: None   Collection Time: 02/18/21  2:12 PM   Specimen: Nasopharyngeal Swab; Nasopharyngeal(NP) swabs in vial transport medium  Result Value Ref Range Status   SARS Coronavirus 2 by RT PCR NEGATIVE NEGATIVE Final    Comment: (NOTE) SARS-CoV-2 target nucleic acids are NOT DETECTED.  The SARS-CoV-2 RNA is  generally detectable in upper respiratory specimens during the acute phase of infection. The lowest concentration of SARS-CoV-2 viral copies this assay can detect is 138 copies/mL. A negative result does not preclude SARS-Cov-2 infection and should not be used as the sole basis for treatment or other patient management decisions. A negative result may occur with  improper specimen collection/handling, submission of specimen other than nasopharyngeal swab, presence of viral mutation(s) within the areas targeted by this assay, and inadequate number of viral copies(<138 copies/mL). A negative result must be combined with clinical observations, patient history, and epidemiological information. The expected result is Negative.  Fact Sheet for Patients:  BloggerCourse.com  Fact Sheet for Healthcare Providers:  SeriousBroker.it  This test is no t yet approved or cleared by the [place] FDA and  has been authorized for detection and/or diagnosis of SARS-CoV-2 by FDA under  an Emergency Use Authorization (EUA). This EUA will remain  in effect (meaning this test can be used) for the duration of the COVID-19 declaration under Section 564(b)(1) of the Act, 21 U.S.C.section 360bbb-3(b)(1), unless the authorization is terminated  or revoked sooner.       Influenza A by PCR NEGATIVE NEGATIVE Final   Influenza B by PCR NEGATIVE NEGATIVE Final    Comment: (NOTE) The Xpert Xpress SARS-CoV-2/FLU/RSV plus assay is intended as an aid in the diagnosis of influenza from Nasopharyngeal swab specimens and should not be used as a sole basis for treatment. Nasal washings and aspirates are unacceptable for Xpert Xpress SARS-CoV-2/FLU/RSV testing.  Fact Sheet for Patients: BloggerCourse.com  Fact Sheet for Healthcare Providers: SeriousBroker.it  This test is not yet approved or cleared by the [place] FDA and has been authorized for detection and/or diagnosis of SARS-CoV-2 by FDA under an Emergency Use Authorization (EUA). This EUA will remain in effect (meaning this test can be used) for the duration of the COVID-19 declaration under Section 564(b)(1) of the Act, 21 U.S.C. section 360bbb-3(b)(1), unless the authorization is terminated or revoked.  Performed at [facility], [address], [place], [place] [address]     Radiology Studies: No results found.  Scheduled Meds:  apixaban  5 mg Oral BID   atorvastatin  40 mg Oral Daily   diltiazem  360 mg Oral Daily   ferrous sulfate  325 mg Oral Q24H   furosemide  40 mg Oral BID   Gerhardt's butt cream   Topical BID   insulin aspart  0-9 Units Subcutaneous TID WC   insulin aspart  3 Units Subcutaneous TID WC   insulin glargine-yfgn  55 Units Subcutaneous Daily   metoprolol succinate  100 mg Oral Daily   potassium chloride  40 mEq Oral Daily   pregabalin  50 mg Oral TID   sodium chloride flush  3 mL Intravenous Q12H   Continuous Infusions:   LOS:  12 days   Time spent: 23 minutes  Hughie Closs, MD [facility]   If 7PM-7AM, please contact night-coverage [email or web address]  02/24/2021, 11:17 AM

## 2021-02-25 DIAGNOSIS — L03115 Cellulitis of right lower limb: Secondary | ICD-10-CM | POA: Diagnosis not present

## 2021-02-25 LAB — GLUCOSE, CAPILLARY
Glucose-Capillary: 150 mg/dL — ABNORMAL HIGH (ref 70–99)
Glucose-Capillary: 169 mg/dL — ABNORMAL HIGH (ref 70–99)
Glucose-Capillary: 196 mg/dL — ABNORMAL HIGH (ref 70–99)
Glucose-Capillary: 174 mg/dL — ABNORMAL HIGH (ref 70–99)

## 2021-02-25 MED ORDER — PIOGLITAZONE HCL 30 MG PO TABS
30.0000 mg | ORAL_TABLET | Freq: Every day | ORAL | Status: DC
  Administered 2021-02-26 – 2021-03-01 (×4): 30 mg via ORAL
  Filled 2021-02-25 (×5): qty 1

## 2021-02-25 MED ORDER — EMPAGLIFLOZIN 25 MG PO TABS
25.0000 mg | ORAL_TABLET | Freq: Every day | ORAL | Status: DC
  Administered 2021-02-26 – 2021-03-19 (×22): 25 mg via ORAL
  Filled 2021-02-25 (×22): qty 1

## 2021-02-25 MED ORDER — GLIPIZIDE ER 10 MG PO TB24
10.0000 mg | ORAL_TABLET | Freq: Every day | ORAL | Status: DC
  Administered 2021-02-26 – 2021-03-09 (×12): 10 mg via ORAL
  Filled 2021-02-25 (×12): qty 1

## 2021-02-25 NOTE — Progress Notes (Addendum)
3:30pm: CSW spoke with Verdon Cummins at [facility] who states the patient needs to be standing consistently prior to admission to the facility.  CSW informed PT of request.  11:30am: Patient informed CSW of her pending Medicare number - #[id number].  CSW provided number to Livingston Wheeler at [facility].  10:30am: CSW spoke with Dartha Lodge, inpatient care manager at [facility] [phone]. [phone]) to discuss patient's discharge plan. CSW requested Lupita Leash speak with medical director to discuss the possibility of obtaining additional SNF days for the patient under her current [employer] plan - Lupita Leash agreeable to discuss request with Wellsite geologist.  CSW spoke with patient to remind her to call [facility] for an update regarding her change of appointment request - she is agreeable and will update CSW once information is obtained. CSW informed patient of request submitted to [facility] for additional SNF days - patient stated understanding.  Edwin Dada, MSW, LCSW Transitions of Care  Clinical Social Worker II [phone]

## 2021-02-25 NOTE — Progress Notes (Signed)
Physical Therapy Treatment Patient Details Name: Pamela [name] MRN: [id number] DOB: 1955-09-06 Today's Date: 02/25/2021   History of Present Illness Pt is a 65 y.o. female admitted 02/08/21 with RLE swelling and erythema. Workup for RLE cellulitis, age-indeterminate RLE DVT. CXR with cardiomegaly, vascular congestion. PMH includes anxiety, depression, afib, CHF, DM2, HTN, rosacea, migraines, R knee DJD. Of note, admission 7/11-7/15/22 for GIB with discharge to SNF with recent return home 02/07/21.   PT Comments    Pt progressing well with mobility. Today's session focused on standing trials with stedy frame; pt requiring maxA+2 to stand, able to maintain static standing balance in stedy with BUE support and minA. Pt remains limited by generalized weakness, decreased activity tolerance and impaired balance strategies. Pt very motivated to participate and regain PLOF. Pt is an excellent candidate for intensive CIR-level therapies to maximize functional mobility and independence prior to return home. Will continue to follow acutely to address established goals.    Recommendations for follow up therapy are one component of a multi-disciplinary discharge planning process, led by the attending physician.  Recommendations may be updated based on patient status, additional functional criteria and insurance authorization.  Follow Up Recommendations  CIR;Supervision for mobility/OOB     Equipment Recommendations   (defer to next venue)    Recommendations for Other Services       Precautions / Restrictions Precautions Precautions: Fall;Other (comment) Precaution Comments: Bladder/bowel incontinence Restrictions Weight Bearing Restrictions: No     Mobility  Bed Mobility Overal bed mobility: Modified Independent Bed Mobility: Supine to Sit;Sit to Supine;Rolling           General bed mobility comments: Mod indep with supine<>sit with use of bed rail; good use of bed rail to roll R/L for  pericare and linen change due to bladder/bowel incontinence    Transfers Overall transfer level: Needs assistance Equipment used: Ambulation equipment used Transfers: Sit to/from Stand Sit to Stand: Max assist;+2 physical assistance;From elevated surface         General transfer comment: Pt able to stand from elevated EOB into stedy standing frame with maxA+2 for trunk elevation, pt with good anterior weight translation but limited by significantly weak LE extensors and difficulty achieving fully upright standing; 2x sit<>stands from stedy frame seat with mod-maxA+2, pt endorses bilateral knee soreness, as well as BLE fatigue  Ambulation/Gait                 Stairs             Wheelchair Mobility    Modified Rankin (Stroke Patients Only)       Balance Overall balance assessment: Needs assistance Sitting-balance support: No upper extremity supported;Feet supported Sitting balance-[name]: Good       Standing balance-[name]: Poor Standing balance comment: Able to maintain static standing in stedy frame with BUE support and minA for ~20-sec before needing to return to sit secondary to BLE pain and fatigue                            Cognition Arousal/Alertness: Awake/alert Behavior During Therapy: WFL for tasks assessed/performed Overall Cognitive Status: Within Functional Limits for tasks assessed                                        Exercises      General Comments General comments (  skin integrity, edema, etc.): Motivated to participate. Recommend use of maximove lift for transfer to/from recliner with nursing staff      Pertinent Vitals/Pain Pain Assessment: Faces Faces Pain Scale: Hurts little more Pain Location: Bilateral knees Pain Descriptors / Indicators: Discomfort;Sore;Tiring Pain Intervention(s): Monitored during session;Limited activity within patient's tolerance    Home Living                       Prior Function            PT Goals (current goals can now be found in the care plan section) Acute Rehab PT Goals Patient Stated Goal: to get stronger; d/c to [facility] for post-acute rehab PT Goal Formulation: With patient Time For Goal Achievement: 03/11/21 Potential to Achieve Goals: Fair Progress towards PT goals: Progressing toward goals    Frequency    Min 3X/week      PT Plan Frequency needs to be updated    Co-evaluation              AM-PAC PT "6 Clicks" Mobility   Outcome Measure  Help needed turning from your back to your side while in a flat bed without using bedrails?: None Help needed moving from lying on your back to sitting on the side of a flat bed without using bedrails?: A Little Help needed moving to and from a bed to a chair (including a wheelchair)?: Total Help needed standing up from a chair using your arms (e.g., wheelchair or bedside chair)?: Total Help needed to walk in hospital room?: Total Help needed climbing 3-5 steps with a railing? : Total 6 Click Score: 11    End of Session Equipment Utilized During Treatment: Gait belt Activity Tolerance: Patient tolerated treatment well Patient left: in bed;with call bell/phone within reach;with bed alarm set Nurse Communication: Mobility status;Need for lift equipment PT Visit Diagnosis: Unsteadiness on feet (R26.81);Muscle weakness (generalized) (M62.81);Difficulty in walking, not elsewhere classified (R26.2);Pain     Time: 8242-3536 PT Time Calculation (min) (ACUTE ONLY): 39 min  Charges:  $Therapeutic Activity: 38-52 mins                     Ina Homes, PT, DPT Acute Rehabilitation Services  Pager [phone] Office [phone]  Malachy Chamber 02/25/2021, 3:50 PM

## 2021-02-25 NOTE — Progress Notes (Signed)
[facility] PROGRESS NOTE  Pamela [name] PPJ:[id number] DOB: [birth year] DOA: 02/08/2021 PCP: Corwin Levins, MD  Status: Remains inpatient appropriate because:Unsafe d/c plan  Dispo: The patient is from: Home              Anticipated d/c is to: [facility]              Patient currently is medically stable to d/c.   Difficult to place patient Yes              Barriers to discharge: Has utilized all insurance days for SNF rehab or acute rehab.  Will be eligible for Medicare next month and once obtains initial Medicare number IR will accept patient   Level of care: Telemetry Cardiac  Code Status: DNR Family Communication: Patient only DVT prophylaxis: Eliquis COVID vaccination status: Pfizer 01/24/2020, 04/03/2020.  Eligible for booster  HPI: 65 year old with past medical history significant for permanent A. fib on Eliquis, chronic diastolic heart failure, CKD stage IIIa, diabetes type 2, hypertension, hyperlipidemia, depression, anxiety presented to the ED for evaluation of right leg swelling. Recently hospitalized 12/17/2020 until 12/21/2020 for lower GI bleed related to diverticulosis and stercoral ulcer.  She was discharged to SNF.  Patient was sent home from skilled nursing facility on 02/07/2021 as her insurance will not pay for further days, patient continued to have knee and hip problems with generalized debility.  Also she did not get a prescription for Eliquis and had not taken it for 3 days.  On 9/1 she noticed increasing swelling and erythema to her right lower leg and presented for evaluation.   Subjective: Alert without any specific complaints.  Eager to discharge to rehab.  States she will be following up with so security today.  Objective: Vitals:   02/24/21 1959 02/25/21 0523  BP: 112/80 (!) 93/57  Pulse: 87 84  Resp: 18 19  Temp: 98.2 F (36.8 C) 97.9 F (36.6 C)  SpO2: 93% 92%    Intake/Output Summary (Last 24 hours) at 02/25/2021 0752 Last data filed at  02/25/2021 0513 Gross per 24 hour  Intake 1160 ml  Output 3650 ml  Net -2490 ml   Filed Weights   02/23/21 0539 02/24/21 0300 02/25/21 0452  Weight: (!) 152.9 kg (!) 153.1 kg (!) 153.4 kg    Exam: Constitutional: NAD, calm, comfortable Respiratory: clear to auscultation bilaterally, no wheezing, no crackles. Normal respiratory effort. No accessory muscle use.  Room air Cardiovascular: Regular rate and rhythm, no murmurs / rubs / gallops.  Bilateral lower extremity edema right greater than left. Abdomen: no tenderness, no masses palpated. LBM 9/18 . Bowel sounds positive.  Skin: No cellulitic changes but does have hemosiderin changes and superficial eschars on anterior surface of RLE Neurologic: CN 2-12 grossly intact. Sensation intact, DTR normal. Strength 5/5 x all 4 extremities.  Psychiatric: Normal judgment and insight. Alert and oriented x 3. Normal mood.    Assessment/Plan: Acute problems: Cellulitis of the right lower extremity age indeterminate right leg DVT: -Doppler on 02/09/2021, age indeterminate right popliteal vein thrombosis.   -Ancef transitioned to Keflex and received total 10 days of antibiotics.  Also received 4 days of doxycycline.  Procalcitonin 35.37 on 9/15 -Continue Eliquis.   Permanent A. fib:  Continue Eliquis Rate controlled on diltiazem and Toprol   General debility, chronic bilateral knee pain, right hip pain: PT recommends continued skilled therapy Plan discharge to [facility] inpatient rehabilitation once funding can be obtained   Mild renal insufficiency  Peak creatinine 1.42 with a GFR of 41 on 9/5 and now back to baseline Baseline creatinine 0.89 through 1.24 with average GFR greater than 6 Stable monitor  Chronic lower extremity edema Continue Lasix Repeat electrolytes in a.m. and follow periodically with resumption of Jardiance   Hypokalemia:  resolved. K+ 3.7 on 9/15   Diabetic neuropathy:  Continue with Lyrica   Type II Diabetes:  CBGs  between 150 and 184 Patient was on Jardiance, Actos and glipizide prior to admission but was not on insulin. 9/19 we will discontinue Semglee injectable insulin and transition back to preadmission medications beginning on 9/20 GFR has recovered-HgbA1c June 2022 was 6.9  Nutrition BMI elevated without any evidence of malnutrition Continue carbohydrate modified diet Body mass index is 45.87 kg/m.    Stage II right buttock decubitus Wound / Incision (Open or Dehisced) 02/09/21 Non-pressure wound Tibial Posterior;Proximal;Right Open blister secondary to cellulitis (Active)  Date First Assessed/Time First Assessed: 02/09/21 1321   Wound Type: Non-pressure wound  Location: Tibial  Location Orientation: Posterior;Proximal;Right  Wound Description (Comments): Open blister secondary to cellulitis  Present on Admission: Yes    Assessments 02/09/2021  1:00 PM 02/24/2021  7:50 AM  Dressing Type Foam - Lift dressing to assess site every shift None  Dressing Changed New --  Dressing Status Dry;Intact;Clean --  Dressing Change Frequency PRN --  Wound Length (cm) 4 cm --  Wound Width (cm) 5 cm --  Wound Depth (cm) 0 cm --  Wound Volume (cm^3) 0 cm^3 --  Wound Surface Area (cm^2) 20 cm^2 --  Drainage Amount Minimal --  Drainage Description Sanguineous;No odor --     No Linked orders to display     Wound / Incision (Open or Dehisced) 02/09/21 (ITD) Intertriginous Dermatitis Buttocks Circumferential red and raised rash (Active)  Date First Assessed/Time First Assessed: 02/09/21 1323   Wound Type: (ITD) Intertriginous Dermatitis  Location: (c) Buttocks  Location Orientation: Circumferential  Wound Description (Comments): red and raised rash  Present on Admission: Yes    Assessments 02/09/2021  1:00 PM 02/25/2021  9:00 AM  Dressing Type Foam - Lift dressing to assess site every shift --  Dressing Changed New --  Dressing Status Dry;Intact;Clean --  Dressing Change Frequency PRN --  Peri-wound Assessment  -- Intact  Drainage Amount None --  Treatment Cleansed --     No Linked orders to display     Pressure Injury 02/09/21 Buttocks Right Stage 2 -  Partial thickness loss of dermis presenting as a shallow open injury with a red, pink wound bed without slough. (Active)  Date First Assessed/Time First Assessed: 02/09/21 2059   Location: Buttocks  Location Orientation: Right  Staging: Stage 2 -  Partial thickness loss of dermis presenting as a shallow open injury with a red, pink wound bed without slough.  Present on A...    Assessments 02/09/2021  8:59 PM 02/25/2021  9:00 AM  Dressing Type Foam - Lift dressing to assess site every shift None     No Linked orders to display      Data Reviewed: Basic Metabolic Panel: Recent Labs  Lab 02/18/21 0831 02/19/21 0823 02/20/21 1214 02/21/21 0822  NA 134* 137 135 135  K 3.2* 3.4* 4.1 3.7  CL 95* 97* 96* 93*  CO2 30 32 32 31  GLUCOSE 225* 166* 203* 220*  BUN 17 16 17 16   CREATININE 1.24* 0.95 0.89 1.07*  CALCIUM 8.9 9.0 9.2 9.3    CBC: Recent Labs  Lab 02/21/21 0822  WBC 9.4  NEUTROABS 6.8  HGB 10.0*  HCT 33.7*  MCV 84.9  PLT 271   Cardiac Enzymes: No results for input(s): CKTOTAL, CKMB, CKMBINDEX, TROPONINI in the last 168 hours. BNP (last 3 results) Recent Labs    02/08/21 2113  BNP 210.6*    ProBNP (last 3 results) Recent Labs    03/20/20 1145 06/21/20 1129  PROBNP 125.0* 139.0*    CBG: Recent Labs  Lab 02/23/21 2058 02/24/21 0556 02/24/21 1102 02/24/21 1603 02/25/21 0612  GLUCAP 215* 184* 173* 176* 150*     Scheduled Meds:  apixaban  5 mg Oral BID   atorvastatin  40 mg Oral Daily   diltiazem  360 mg Oral Daily   ferrous sulfate  325 mg Oral Q24H   furosemide  40 mg Oral BID   Gerhardt's butt cream   Topical BID   insulin aspart  0-9 Units Subcutaneous TID WC   insulin aspart  3 Units Subcutaneous TID WC   insulin glargine-yfgn  55 Units Subcutaneous Daily   metoprolol succinate  100 mg Oral Daily    potassium chloride  40 mEq Oral Daily   pregabalin  50 mg Oral TID   sodium chloride flush  3 mL Intravenous Q12H    Principal Problem:   Cellulitis of right lower extremity Active Problems:   Type 2 diabetes mellitus (HCC)   Hypertension associated with diabetes (HCC)   CKD (chronic kidney disease) stage 3, GFR 30-59 ml/min (HCC)   Chronic atrial fibrillation (HCC)   Chronic diastolic heart failure (HCC)   Hyperlipidemia associated with type 2 diabetes mellitus (HCC)   Pressure injury of skin   Consultants: None  Procedures: None  Antibiotics: IV cefazolin 9/2 through 9/6 PO cephalexin 9/7 through 9/12 PO doxycycline 9/8 through 9/12  Time spent: 35 minutes    Junious Silk ANP  [facility] 7 am - 330 pm/M-F for direct patient care and secure chat Please refer to Amion for contact info 13  days

## 2021-02-25 NOTE — Plan of Care (Signed)
  Problem: Clinical Measurements: Goal: Will remain free from infection Outcome: Progressing Goal: Diagnostic test results will improve Outcome: Progressing   Problem: Coping: Goal: Level of anxiety will decrease Outcome: Progressing   

## 2021-02-25 NOTE — Progress Notes (Addendum)
Occupational Therapy Treatment Patient Details Name: Pamela [name] MRN: [id number] DOB: April 30, 1956 Today's Date: 02/25/2021   History of present illness Pt is a 65 y.o. female who presented 02/08/21 with R leg swelling and erythema. Per chart: "Patient was hospitalized 12/17/2020-12/21/2020 for lower GI bleeding related to diverticulosis and stercoral ulcer" and then discharged to SNF then home. Chest x-ray shows cardiomegaly with vascular congestion. Pt admitted with R lower extremiity cellulitis and age indeterminant R leg DVT. PMH: anxiety, permanent a-fib, chronic diastolic CHF, depression, DM2, diastolic dysfunction, HTN, migraines, R knee DJD, rosacea.   OT comments  Pt demonstrating modified independence with bed mobility. Completed grooming and UB bathing at EOB with set up to min assist. Pt able to doff socks, but needing assist to don. Able to laterally scoot along EOB with min guard assist. Pt is eager to go to rehab and return home. Her brother and sister in law can assist with IADL. Goals updated.    Recommendations for follow up therapy are one component of a multi-disciplinary discharge planning process, led by the attending physician.  Recommendations may be updated based on patient status, additional functional criteria and insurance authorization.    Follow Up Recommendations   Post acute rehab   Equipment Recommendations  None recommended by OT    Recommendations for Other Services      Precautions / Restrictions Precautions Precautions: Fall       Mobility Bed Mobility Overal bed mobility: Modified Independent                  Transfers Overall transfer level: Needs assistance   Transfers: Lateral/Scoot Transfers          Lateral/Scoot Transfers: Min guard      Balance Overall balance assessment: Needs assistance   Sitting balance-[name]: Good                                     ADL either performed or assessed with clinical  judgement   ADL Overall ADL's : Needs assistance/impaired     Grooming: Wash/dry hands;Wash/dry face;Applying deodorant;Oral care;Sitting;Set up   Upper Body Bathing: Sitting;Minimal assistance Upper Body Bathing Details (indicate cue type and reason): assisted for back         Lower Body Dressing: Total assistance;Sitting/lateral leans Lower Body Dressing Details (indicate cue type and reason): can doff socks, assist to don                     Vision       Perception     Praxis      Cognition Arousal/Alertness: Awake/alert Behavior During Therapy: WFL for tasks assessed/performed Overall Cognitive Status: Within Functional Limits for tasks assessed                                          Exercises     Shoulder Instructions       General Comments      Pertinent Vitals/ Pain       Pain Assessment: Faces Faces Pain Scale: No hurt  Home Living  Prior Functioning/Environment              Frequency  Min 2X/week        Progress Toward Goals  OT Goals(current goals can now be found in the care plan section)  Progress towards OT goals: Progressing toward goals  Acute Rehab OT Goals Patient Stated Goal: to get stronger OT Goal Formulation: With patient Time For Goal Achievement: 03/11/21 Potential to Achieve Goals: Good ADL Goals Pt Will Perform Upper Body Bathing: with set-up;sitting;with adaptive equipment Pt Will Perform Lower Body Bathing: with mod assist;sitting/lateral leans;bed level;with adaptive equipment Pt Will Perform Lower Body Dressing: with mod assist;with adaptive equipment;sitting/lateral leans Pt Will Transfer to Toilet: with mod assist;with +2 assist;with transfer board;bedside commode  Plan Discharge plan needs to be updated    Co-evaluation                 AM-PAC OT "6 Clicks" Daily Activity     Outcome Measure   Help from another  person eating meals?: None Help from another person taking care of personal grooming?: A Little Help from another person toileting, which includes using toliet, bedpan, or urinal?: A Lot Help from another person bathing (including washing, rinsing, drying)?: A Lot Help from another person to put on and taking off regular upper body clothing?: A Little Help from another person to put on and taking off regular lower body clothing?: Total 6 Click Score: 15    End of Session    OT Visit Diagnosis: Muscle weakness (generalized) (M62.81)   Activity Tolerance Patient tolerated treatment well   Patient Left in bed;with call bell/phone within reach   Nurse Communication          Time: 2706-2376 OT Time Calculation (min): 38 min  Charges: OT General Charges $OT Visit: 1 Visit OT Treatments $Self Care/Home Management : 23-37 mins  Martie Round, OTR/L Acute Rehabilitation Services Pager: [phone] Office: [phone]   Evern Bio 02/25/2021, 10:51 AM

## 2021-02-26 DIAGNOSIS — L03115 Cellulitis of right lower limb: Secondary | ICD-10-CM | POA: Diagnosis not present

## 2021-02-26 LAB — BASIC METABOLIC PANEL
Anion gap: 10 (ref 5–15)
BUN: 16 mg/dL (ref 8–23)
CO2: 31 mmol/L (ref 22–32)
Calcium: 9.3 mg/dL (ref 8.9–10.3)
Chloride: 95 mmol/L — ABNORMAL LOW (ref 98–111)
Creatinine, Ser: 1.13 mg/dL — ABNORMAL HIGH (ref 0.44–1.00)
GFR, Estimated: 54 mL/min — ABNORMAL LOW (ref >60)
Glucose, Bld: 138 mg/dL — ABNORMAL HIGH (ref 70–99)
Potassium: 3.2 mmol/L — ABNORMAL LOW (ref 3.5–5.1)
Sodium: 136 mmol/L (ref 135–145)

## 2021-02-26 LAB — GLUCOSE, CAPILLARY
Glucose-Capillary: 123 mg/dL — ABNORMAL HIGH (ref 70–99)
Glucose-Capillary: 155 mg/dL — ABNORMAL HIGH (ref 70–99)
Glucose-Capillary: 160 mg/dL — ABNORMAL HIGH (ref 70–99)
Glucose-Capillary: 131 mg/dL — ABNORMAL HIGH (ref 70–99)

## 2021-02-26 MED ORDER — POTASSIUM CHLORIDE CRYS ER 20 MEQ PO TBCR
40.0000 meq | EXTENDED_RELEASE_TABLET | Freq: Once | ORAL | Status: AC
  Administered 2021-02-26: 40 meq via ORAL
  Filled 2021-02-26: qty 2

## 2021-02-26 NOTE — Progress Notes (Signed)
PROGRESS NOTE    Pamela [name]  OZY:[id number] DOB: [birth year] DOA: 02/08/2021 PCP: Corwin Levins, MD   Brief Narrative: 65 year old with past medical history significant for permanent A. fib on Eliquis, chronic diastolic heart failure, CKD stage IIIa, diabetes type 2, hypertension, hyperlipidemia, depression, anxiety presented to the ED for evaluation of right leg swelling. Recently hospitalized 12/17/2020 until 12/21/2020 for lower GI bleed related to diverticulosis and stercoral ulcer.  She was discharged to SNF.  Patient was sent home from skilled nursing facility on 02/07/2021 as her insurance will not pay for further days, patient continued to have knee and hip problems with generalized debility.  Also she did not get a prescription for Eliquis and had not taken it for 3 days.  On 9/1 she noticed increasing swelling and erythema to her right lower leg and presented for evaluation.   Assessment & Plan:   Principal Problem:   Cellulitis of right lower extremity Active Problems:   Type 2 diabetes mellitus (HCC)   Hypertension associated with diabetes (HCC)   CKD (chronic kidney disease) stage 3, GFR 30-59 ml/min (HCC)   Chronic atrial fibrillation (HCC)   Chronic diastolic heart failure (HCC)   Hyperlipidemia associated with type 2 diabetes mellitus (HCC)   Pressure injury of skin  1-Cellulitis of the right lower extremity age indeterminate right leg DVT: -Doppler on 02/09/2021, age indeterminate right popliteal vein thrombosis.  Patient was initially started on Ancef and then transitioned to Keflex and received total 10 days of antibiotics.  And then also received 4 days of doxycycline.  -Continue with Eliquis. -Continue with Laxis- -She will benefit from skilled rehab  2-Permanent A. fib: Continue with Eliquis Continue with diltiazem and Toprol  3-General debility, chronic bilateral knee pain, right hip pain: Continue with PT. PT updated recommendations to CIR but CIR does not  recommend it.  4-CKD stage IIIa: Stable monitor  Hypokalemia: resolved.  Diabetic neuropathy: Continue with Lyrica  Type II Diabetes: Was on Semglee 55 units up until yesterday.  That was discontinued and her home medications were resumed.  She is currently on glipizide, Actos and Jardiance.  Other than Actos, other 2 medications are really not recommended as inpatient however since patient is fairly stable and basically waiting for placement and blood sugar is also stable, we will continue these along with SSI.   Pressure Injury 02/09/21 Buttocks Right Stage 2 -  Partial thickness loss of dermis presenting as a shallow open injury with a red, pink wound bed without slough. (Active)  02/09/21 2059  Location: Buttocks  Location Orientation: Right  Staging: Stage 2 -  Partial thickness loss of dermis presenting as a shallow open injury with a red, pink wound bed without slough.  Wound Description (Comments):   Present on Admission: Yes     Estimated body mass index is 45.48 kg/m as calculated from the following:   Height as of this encounter: 6' (1.829 m).   Weight as of this encounter: 152.1 kg.   DVT prophylaxis: Eliquis Code Status: DNR Family Communication: Care discussed with patient.  Disposition Plan:  Status is: Inpatient  Remains inpatient appropriate because:IV treatments appropriate due to intensity of illness or inability to take PO and Inpatient level of care appropriate due to severity of illness  Dispo: The patient is from: Home              Anticipated d/c is to: SNF  Patient currently is medically stable to d/c. Awaiting SNF   Difficult to place patient Yes        Consultants:  None  Procedures:    Antimicrobials:    Subjective: Patient seen and examined.  She has no complaints.   Objective: Vitals:   02/25/21 0900 02/25/21 1513 02/25/21 2038 02/26/21 0359  BP: 118/66 115/69 112/80 101/64  Pulse:  81 68 89  Resp:   18 18   Temp:   98.6 F (37 C) 98.6 F (37 C)  TempSrc:   Oral Oral  SpO2:  96% 91% 95%  Weight:    (!) 152.1 kg  Height:        Intake/Output Summary (Last 24 hours) at 02/26/2021 1031 Last data filed at 02/25/2021 2143 Gross per 24 hour  Intake 1200 ml  Output 1850 ml  Net -650 ml    Filed Weights   02/24/21 0300 02/25/21 0452 02/26/21 0359  Weight: (!) 153.1 kg (!) 153.4 kg (!) 152.1 kg    Examination:  General exam: Appears calm and comfortable, obese Respiratory system: Clear to auscultation. Respiratory effort normal. Cardiovascular system: S1 & S2 heard, RRR. No JVD, murmurs, rubs, gallops or clicks. No pedal edema. Gastrointestinal system: Abdomen is nondistended, soft and nontender. No organomegaly or masses felt. Normal bowel sounds heard. Central nervous system: Alert and oriented. No focal neurological deficits. Extremities: Symmetric 5 x 5 power. Skin: No rashes, lesions or ulcers.  Psychiatry: Judgement and insight appear normal. Mood & affect appropriate.    Data Reviewed: I have personally reviewed following labs and imaging studies  CBC: Recent Labs  Lab 02/21/21 0822  WBC 9.4  NEUTROABS 6.8  HGB 10.0*  HCT 33.7*  MCV 84.9  PLT 271    Basic Metabolic Panel: Recent Labs  Lab 02/20/21 1214 02/21/21 0822 02/26/21 0346  NA 135 135 136  K 4.1 3.7 3.2*  CL 96* 93* 95*  CO2 32 31 31  GLUCOSE 203* 220* 138*  BUN 17 16 16   CREATININE 0.89 1.07* 1.13*  CALCIUM 9.2 9.3 9.3    GFR: Estimated Creatinine Clearance: 83.1 mL/min (A) (by C-G formula based on SCr of 1.13 mg/dL (H)). Liver Function Tests: No results for input(s): AST, ALT, ALKPHOS, BILITOT, PROT, ALBUMIN in the last 168 hours.  No results for input(s): LIPASE, AMYLASE in the last 168 hours. No results for input(s): AMMONIA in the last 168 hours. Coagulation Profile: No results for input(s): INR, PROTIME in the last 168 hours. Cardiac Enzymes: No results for input(s): CKTOTAL, CKMB,  CKMBINDEX, TROPONINI in the last 168 hours. BNP (last 3 results) Recent Labs    03/20/20 1145 06/21/20 1129  PROBNP 125.0* 139.0*    HbA1C: No results for input(s): HGBA1C in the last 72 hours. CBG: Recent Labs  Lab 02/25/21 0612 02/25/21 1114 02/25/21 1550 02/25/21 2036 02/26/21 0615  GLUCAP 150* 169* 196* 174* 123*    Lipid Profile: No results for input(s): CHOL, HDL, LDLCALC, TRIG, CHOLHDL, LDLDIRECT in the last 72 hours. Thyroid Function Tests: No results for input(s): TSH, T4TOTAL, FREET4, T3FREE, THYROIDAB in the last 72 hours. Anemia Panel: No results for input(s): VITAMINB12, FOLATE, FERRITIN, TIBC, IRON, RETICCTPCT in the last 72 hours. Sepsis Labs: Recent Labs  Lab 02/21/21 0822  PROCALCITON 35.37     Recent Results (from the past 240 hour(s))  Resp Panel by RT-PCR (Flu A&B, Covid) Nasopharyngeal Swab     Status: None   Collection Time: 02/18/21  2:12 PM  Specimen: Nasopharyngeal Swab; Nasopharyngeal(NP) swabs in vial transport medium  Result Value Ref Range Status   SARS Coronavirus 2 by RT PCR NEGATIVE NEGATIVE Final    Comment: (NOTE) SARS-CoV-2 target nucleic acids are NOT DETECTED.  The SARS-CoV-2 RNA is generally detectable in upper respiratory specimens during the acute phase of infection. The lowest concentration of SARS-CoV-2 viral copies this assay can detect is 138 copies/mL. A negative result does not preclude SARS-Cov-2 infection and should not be used as the sole basis for treatment or other patient management decisions. A negative result may occur with  improper specimen collection/handling, submission of specimen other than nasopharyngeal swab, presence of viral mutation(s) within the areas targeted by this assay, and inadequate number of viral copies(<138 copies/mL). A negative result must be combined with clinical observations, patient history, and epidemiological information. The expected result is Negative.  Fact Sheet for  Patients:  BloggerCourse.com  Fact Sheet for Healthcare Providers:  SeriousBroker.it  This test is no t yet approved or cleared by the [place] FDA and  has been authorized for detection and/or diagnosis of SARS-CoV-2 by FDA under an Emergency Use Authorization (EUA). This EUA will remain  in effect (meaning this test can be used) for the duration of the COVID-19 declaration under Section 564(b)(1) of the Act, 21 U.S.C.section 360bbb-3(b)(1), unless the authorization is terminated  or revoked sooner.       Influenza A by PCR NEGATIVE NEGATIVE Final   Influenza B by PCR NEGATIVE NEGATIVE Final    Comment: (NOTE) The Xpert Xpress SARS-CoV-2/FLU/RSV plus assay is intended as an aid in the diagnosis of influenza from Nasopharyngeal swab specimens and should not be used as a sole basis for treatment. Nasal washings and aspirates are unacceptable for Xpert Xpress SARS-CoV-2/FLU/RSV testing.  Fact Sheet for Patients: BloggerCourse.com  Fact Sheet for Healthcare Providers: SeriousBroker.it  This test is not yet approved or cleared by the [place] FDA and has been authorized for detection and/or diagnosis of SARS-CoV-2 by FDA under an Emergency Use Authorization (EUA). This EUA will remain in effect (meaning this test can be used) for the duration of the COVID-19 declaration under Section 564(b)(1) of the Act, 21 U.S.C. section 360bbb-3(b)(1), unless the authorization is terminated or revoked.  Performed at [facility], [address], [place], [place] [address]     Radiology Studies: No results found.  Scheduled Meds:  apixaban  5 mg Oral BID   atorvastatin  40 mg Oral Daily   diltiazem  360 mg Oral Daily   empagliflozin  25 mg Oral Daily   ferrous sulfate  325 mg Oral Q24H   furosemide  40 mg Oral BID   Gerhardt's butt cream   Topical BID   glipiZIDE  10  mg Oral Q breakfast   insulin aspart  0-9 Units Subcutaneous TID WC   insulin aspart  3 Units Subcutaneous TID WC   metoprolol succinate  100 mg Oral Daily   pioglitazone  30 mg Oral Daily   potassium chloride  40 mEq Oral Daily   potassium chloride  40 mEq Oral Once   pregabalin  50 mg Oral TID   sodium chloride flush  3 mL Intravenous Q12H   Continuous Infusions:   LOS: 14 days   Time spent: 24 minutes  Hughie Closs, MD [facility]   If 7PM-7AM, please contact night-coverage [email or web address]  02/26/2021, 10:31 AM

## 2021-02-26 NOTE — Progress Notes (Signed)
Inpatient Rehab Admissions Coordinator Note:   Per updated therapy recommendations patient was screened for CIR candidacy by Stephania Fragmin, PT. Note pt from home alone with limited support.  Based on current level of function would not expect pt to be safe to live independently following CIR admission.  No consult recommended at this time.  Please contact me any with questions.Estill Dooms, PT, DPT [phone] 02/26/21 9:28 AM

## 2021-02-26 NOTE — Plan of Care (Signed)
  Problem: Education: Goal: Knowledge of General Education information will improve Description Including pain rating scale, medication(s)/side effects and non-pharmacologic comfort measures Outcome: Progressing   Problem: Health Behavior/Discharge Planning: Goal: Ability to manage health-related needs will improve Outcome: Progressing   

## 2021-02-27 DIAGNOSIS — L03115 Cellulitis of right lower limb: Secondary | ICD-10-CM | POA: Diagnosis not present

## 2021-02-27 LAB — GLUCOSE, CAPILLARY
Glucose-Capillary: 103 mg/dL — ABNORMAL HIGH (ref 70–99)
Glucose-Capillary: 143 mg/dL — ABNORMAL HIGH (ref 70–99)
Glucose-Capillary: 156 mg/dL — ABNORMAL HIGH (ref 70–99)
Glucose-Capillary: 153 mg/dL — ABNORMAL HIGH (ref 70–99)

## 2021-02-27 NOTE — Progress Notes (Signed)
Physical Therapy Treatment Patient Details Name: Pamela [name] MRN: [id number] DOB: Sep 22, 1955 Today's Date: 02/27/2021   History of Present Illness Pt is a 65 y.o. female admitted 02/08/21 with RLE swelling and erythema. Workup for RLE cellulitis, age-indeterminate RLE DVT. CXR with cardiomegaly, vascular congestion. PMH includes anxiety, depression, afib, CHF, DM2, HTN, rosacea, migraines, R knee DJD. Of note, admission 7/11-7/15/22 for GIB with discharge to SNF with recent return home 02/07/21.    PT Comments    Focused session on further progressing transfers and static standing with use of stedy. Pt was able to tolerate standing statically x4 bouts with use of the stedy and min-modA to extend her hips this date. In addition, she required maxAx2 to transfer to stand from elevated EOB > stedy but only modAx1 to transfer to stand from the stedy flaps. Pt continues to display deficits in lower extremity extensor muscle strength and knee pain that impacts her ability to stand upright. Will continue to follow acutely. Pt would greatly benefit from intensive therapy in the inpatient rehab setting to maximize her safety and independence with all functional mobility as she is very motivated to improve and is functioning significantly below her baseline.    Recommendations for follow up therapy are one component of a multi-disciplinary discharge planning process, led by the attending physician.  Recommendations may be updated based on patient status, additional functional criteria and insurance authorization.  Follow Up Recommendations  CIR;Supervision for mobility/OOB     Equipment Recommendations  Other (comment) (defer to next venue)    Recommendations for Other Services       Precautions / Restrictions Precautions Precautions: Fall;Other (comment) Precaution Comments: Bladder/bowel incontinence Restrictions Weight Bearing Restrictions: No     Mobility  Bed Mobility Overal bed mobility:  Modified Independent Bed Mobility: Supine to Sit     Supine to sit: HOB elevated;Modified independent (Device/Increase time)     General bed mobility comments: Mod indep with supine>sit with use of bed rail and HOB elevated.    Transfers Overall transfer level: Needs assistance Equipment used: Ambulation equipment used Transfers: Sit to/from Stand Sit to Stand: Max assist;+2 physical assistance;From elevated surface         General transfer comment: Pt able to stand from elevated EOB into stedy standing frame with maxA+2 for trunk elevation and hip extension, pt with good anterior weight translation but limited by significantly weak LE extensors and difficulty achieving fully upright standing; 3x sit<>stands from stedy frame seat with modA, +2 for safety, pt endorses R knee soreness, as well as BLE fatigue, standing for decreased periods of time with progression of reps.  Ambulation/Gait             General Gait Details: unable   Stairs             Wheelchair Mobility    Modified Rankin (Stroke Patients Only)       Balance Overall balance assessment: Needs assistance Sitting-balance support: No upper extremity supported;Feet supported Sitting balance-[name]: Good Sitting balance - Comments: Supervision sitting EOB statically and dynamically.   Standing balance support: Bilateral upper extremity supported Standing balance-[name]: Poor Standing balance comment: Able to maintain static standing in stedy frame with BUE support and minA-modA for ~10-30 sec before fatiguing, decreased time in stand as reps progressed (x4 reps total).                            Cognition Arousal/Alertness: Awake/alert  Behavior During Therapy: WFL for tasks assessed/performed Overall Cognitive Status: Within Functional Limits for tasks assessed                                        Exercises General Exercises - Lower Extremity Gluteal  Sets: AROM;Both;10 reps;Seated (x3 sec holds) Long Arc Quad: AROM;Both;10 reps;Seated (cues to hold isometrically at top and eccentric control with descent)    General Comments        Pertinent Vitals/Pain Pain Assessment: Faces Faces Pain Scale: Hurts little more Pain Location: R knee Pain Descriptors / Indicators: Discomfort;Sore;Tiring Pain Intervention(s): Limited activity within patient's tolerance;Monitored during session;Repositioned    Home Living                      Prior Function            PT Goals (current goals can now be found in the care plan section) Acute Rehab PT Goals Patient Stated Goal: to get up to chair and go to inpatient rehab PT Goal Formulation: With patient Time For Goal Achievement: 03/11/21 Potential to Achieve Goals: Fair Progress towards PT goals: Progressing toward goals    Frequency    Min 3X/week      PT Plan Current plan remains appropriate    Co-evaluation              AM-PAC PT "6 Clicks" Mobility   Outcome Measure  Help needed turning from your back to your side while in a flat bed without using bedrails?: None Help needed moving from lying on your back to sitting on the side of a flat bed without using bedrails?: None Help needed moving to and from a bed to a chair (including a wheelchair)?: Total Help needed standing up from a chair using your arms (e.g., wheelchair or bedside chair)?: Total Help needed to walk in hospital room?: Total Help needed climbing 3-5 steps with a railing? : Total 6 Click Score: 12    End of Session Equipment Utilized During Treatment: Gait belt Activity Tolerance: Patient tolerated treatment well Patient left: in chair;with call bell/phone within reach;with chair alarm set;with nursing/sitter in room Nurse Communication: Mobility status;Need for lift equipment PT Visit Diagnosis: Unsteadiness on feet (R26.81);Muscle weakness (generalized) (M62.81);Difficulty in walking, not  elsewhere classified (R26.2);Pain Pain - Right/Left: Right Pain - part of body: Leg     Time: 9924-2683 PT Time Calculation (min) (ACUTE ONLY): 22 min  Charges:  $Therapeutic Activity: 8-22 mins                     Raymond Gurney, PT, DPT Acute Rehabilitation Services  Pager: [phone] Office: [phone]    Jewel Baize 02/27/2021, 12:09 PM

## 2021-02-27 NOTE — Progress Notes (Signed)
[facility] PROGRESS NOTE  Pamela [name] SWF:[id number] DOB: 04-Dec-1955 DOA: 02/08/2021 PCP: Corwin Levins, MD  Status: Remains inpatient appropriate because:Unsafe d/c plan patient is unable to independently stand for more than 1 minute and we are continuing aggressive PT and OT ups of her increasing standing duration so she can transition to an inpatient rehabilitation program at [facility]  Dispo: The patient is from: Home              Anticipated d/c is to: [facility]              Patient currently is medically stable to d/c.   Difficult to place patient Yes              Barriers to discharge: Has utilized all insurance days for SNF rehab or acute rehab.  Will be eligible for Medicare next month and once obtains initial Medicare number IR will accept patient   Level of care: Telemetry Cardiac  Code Status: DNR Family Communication: Patient only DVT prophylaxis: Eliquis COVID vaccination status: Pfizer 01/24/2020, 04/03/2020.  Eligible for booster  HPI: 65 year old with past medical history significant for permanent A. fib on Eliquis, chronic diastolic heart failure, CKD stage IIIa, diabetes type 2, hypertension, hyperlipidemia, depression, anxiety presented to the ED for evaluation of right leg swelling. Recently hospitalized 12/17/2020 until 12/21/2020 for lower GI bleed related to diverticulosis and stercoral ulcer.  She was discharged to SNF.  Patient was sent home from skilled nursing facility on 02/07/2021 as her insurance will not pay for further days, patient continued to have knee and hip problems with generalized debility.  Also she did not get a prescription for Eliquis and had not taken it for 3 days.  On 9/1 she noticed increasing swelling and erythema to her right lower leg and presented for evaluation.   Subjective: Alert and sitting up in bed.  States unable to ambulate and requires 2+ assist to even stand at the side of the bed.  She does state that she is very eager  to regain her prior level of strength and mobility and wants to return to work for her scheduled retirement.  Objective: Vitals:   02/26/21 1917 02/27/21 0426  BP: 110/75 109/62  Pulse: 93 94  Resp: 20 18  Temp: 98.6 F (37 C) (!) 97.5 F (36.4 C)  SpO2: 92% 93%    Intake/Output Summary (Last 24 hours) at 02/27/2021 0735 Last data filed at 02/27/2021 0431 Gross per 24 hour  Intake 600 ml  Output 2350 ml  Net -1750 ml   Filed Weights   02/25/21 0452 02/26/21 0359 02/27/21 0426  Weight: (!) 153.4 kg (!) 152.1 kg (!) 151.8 kg    Exam: Constitutional: NAD, calm, comfortable Respiratory: CTA, stable on RA, normal oximetry Cardiovascular: Regular pulse, chronic stable lower extremity edema, normotensive.  Normal heart sound Abdomen: no tenderness, no masses palpated. LBM 9/20 . Bowel sounds positive.  Skin: + hemosiderin changes and superficial eschars on anterior surface of RLE Neurologic: CN 2-12 grossly intact. Sensation intact, DTR normal. Strength 5/5 x all 4 extremities.  Psychiatric: Normal judgment and insight. Alert and oriented x 3. Normal mood.    Assessment/Plan: Acute problems: Cellulitis of the right lower extremity age indeterminate right leg DVT: Doppler on 02/09/2021, age indeterminate right popliteal vein thrombosis.   Has completed 10 days of both IV and oral antibiotics (Ancef, cephalexin and doxycycline) Continue Eliquis.   Permanent A. fib:  Continue Eliquis Rate controlled on diltiazem and  Toprol   General debility, chronic bilateral knee pain, right hip pain: Plan discharge to [facility] rehabilitation once funding can be obtained and patient able to stand for longer periods of time and participate with aggressive therapy   Mild renal insufficiency Peak creatinine 1.42 with a GFR of 41 on 9/5 and now back to baseline Baseline creatinine 0.89 through 1.24 with average GFR greater than 6 Stable monitor  Chronic lower extremity edema Continue  Lasix Repeat electrolytes in a.m. and follow periodically with resumption of Jardiance-no significant increase in urine output after resumption of Jardiance   Hypokalemia:  resolved. K+ 3.7 on 9/15 on 9/20 was down to 3.2 Will repeat again in a.m. on 9/22 and if continues with hypokalemia will put on low-dose regular potassium   Diabetic neuropathy:  Continue with Lyrica   Type II Diabetes:  Patient was on Jardiance, Actos and glipizide prior to admission but was not on insulin. Continue preadmission medications as above GFR has recovered-HgbA1c June 2022 was 6.9  Nutrition BMI elevated without any evidence of malnutrition Continue carbohydrate modified diet Body mass index is 45.39 kg/m.    Stage II right buttock decubitus Wound / Incision (Open or Dehisced) 02/09/21 Non-pressure wound Tibial Posterior;Proximal;Right Open blister secondary to cellulitis (Active)  Date First Assessed/Time First Assessed: 02/09/21 1321   Wound Type: Non-pressure wound  Location: Tibial  Location Orientation: Posterior;Proximal;Right  Wound Description (Comments): Open blister secondary to cellulitis  Present on Admission: Yes    Assessments 02/09/2021  1:00 PM 02/24/2021  7:50 AM  Dressing Type Foam - Lift dressing to assess site every shift None  Dressing Changed New --  Dressing Status Dry;Intact;Clean --  Dressing Change Frequency PRN --  Wound Length (cm) 4 cm --  Wound Width (cm) 5 cm --  Wound Depth (cm) 0 cm --  Wound Volume (cm^3) 0 cm^3 --  Wound Surface Area (cm^2) 20 cm^2 --  Drainage Amount Minimal --  Drainage Description Sanguineous;No odor --     No Linked orders to display     Wound / Incision (Open or Dehisced) 02/09/21 (ITD) Intertriginous Dermatitis Buttocks Circumferential red and raised rash (Active)  Date First Assessed/Time First Assessed: 02/09/21 1323   Wound Type: (ITD) Intertriginous Dermatitis  Location: (c) Buttocks  Location Orientation: Circumferential  Wound  Description (Comments): red and raised rash  Present on Admission: Yes    Assessments 02/09/2021  1:00 PM 02/26/2021  8:43 PM  Dressing Type Foam - Lift dressing to assess site every shift None  Dressing Changed New --  Dressing Status Dry;Intact;Clean --  Dressing Change Frequency PRN --  Site / Wound Assessment -- Clean;Dry;Pink  Drainage Amount None --  Treatment Cleansed --     No Linked orders to display     Pressure Injury 02/09/21 Buttocks Right Stage 2 -  Partial thickness loss of dermis presenting as a shallow open injury with a red, pink wound bed without slough. (Active)  Date First Assessed/Time First Assessed: 02/09/21 2059   Location: Buttocks  Location Orientation: Right  Staging: Stage 2 -  Partial thickness loss of dermis presenting as a shallow open injury with a red, pink wound bed without slough.  Present on A...    Assessments 02/09/2021  8:59 PM 02/26/2021  8:43 PM  Dressing Type Foam - Lift dressing to assess site every shift None  Site / Wound Assessment -- Pink;Dry     No Linked orders to display      Data Reviewed: Basic  Metabolic Panel: Recent Labs  Lab 02/20/21 1214 02/21/21 0822 02/26/21 0346  NA 135 135 136  K 4.1 3.7 3.2*  CL 96* 93* 95*  CO2 32 31 31  GLUCOSE 203* 220* 138*  BUN 17 16 16   CREATININE 0.89 1.07* 1.13*  CALCIUM 9.2 9.3 9.3    CBC: Recent Labs  Lab 02/21/21 0822  WBC 9.4  NEUTROABS 6.8  HGB 10.0*  HCT 33.7*  MCV 84.9  PLT 271   Cardiac Enzymes: No results for input(s): CKTOTAL, CKMB, CKMBINDEX, TROPONINI in the last 168 hours. BNP (last 3 results) Recent Labs    02/08/21 2113  BNP 210.6*    ProBNP (last 3 results) Recent Labs    03/20/20 1145 06/21/20 1129  PROBNP 125.0* 139.0*    CBG: Recent Labs  Lab 02/26/21 0615 02/26/21 1053 02/26/21 1646 02/26/21 2138 02/27/21 0605  GLUCAP 123* 155* 160* 131* 103*     Scheduled Meds:  apixaban  5 mg Oral BID   atorvastatin  40 mg Oral Daily   diltiazem   360 mg Oral Daily   empagliflozin  25 mg Oral Daily   ferrous sulfate  325 mg Oral Q24H   furosemide  40 mg Oral BID   Gerhardt's butt cream   Topical BID   glipiZIDE  10 mg Oral Q breakfast   insulin aspart  0-9 Units Subcutaneous TID WC   insulin aspart  3 Units Subcutaneous TID WC   metoprolol succinate  100 mg Oral Daily   pioglitazone  30 mg Oral Daily   potassium chloride  40 mEq Oral Daily   pregabalin  50 mg Oral TID   sodium chloride flush  3 mL Intravenous Q12H    Principal Problem:   Cellulitis of right lower extremity Active Problems:   Type 2 diabetes mellitus (HCC)   Hypertension associated with diabetes (HCC)   CKD (chronic kidney disease) stage 3, GFR 30-59 ml/min (HCC)   Chronic atrial fibrillation (HCC)   Chronic diastolic heart failure (HCC)   Hyperlipidemia associated with type 2 diabetes mellitus (HCC)   Pressure injury of skin   Consultants: None  Procedures: None  Antibiotics: IV cefazolin 9/2 through 9/6 PO cephalexin 9/7 through 9/12 PO doxycycline 9/8 through 9/12  Time spent: 35 minutes    11/12 ANP  [facility] 7 am - 330 pm/M-F for direct patient care and secure chat Please refer to Amion for contact info 15  days

## 2021-02-27 NOTE — Plan of Care (Signed)
  Problem: Education: Goal: Knowledge of General Education information will improve Description Including pain rating scale, medication(s)/side effects and non-pharmacologic comfort measures Outcome: Progressing   Problem: Health Behavior/Discharge Planning: Goal: Ability to manage health-related needs will improve Outcome: Progressing   

## 2021-02-28 DIAGNOSIS — L03115 Cellulitis of right lower limb: Secondary | ICD-10-CM | POA: Diagnosis not present

## 2021-02-28 LAB — GLUCOSE, CAPILLARY
Glucose-Capillary: 148 mg/dL — ABNORMAL HIGH (ref 70–99)
Glucose-Capillary: 155 mg/dL — ABNORMAL HIGH (ref 70–99)
Glucose-Capillary: 148 mg/dL — ABNORMAL HIGH (ref 70–99)
Glucose-Capillary: 148 mg/dL — ABNORMAL HIGH (ref 70–99)

## 2021-02-28 LAB — BASIC METABOLIC PANEL
Anion gap: 8 (ref 5–15)
BUN: 18 mg/dL (ref 8–23)
CO2: 30 mmol/L (ref 22–32)
Calcium: 9.1 mg/dL (ref 8.9–10.3)
Chloride: 97 mmol/L — ABNORMAL LOW (ref 98–111)
Creatinine, Ser: 1.44 mg/dL — ABNORMAL HIGH (ref 0.44–1.00)
GFR, Estimated: 41 mL/min — ABNORMAL LOW (ref >60)
Glucose, Bld: 148 mg/dL — ABNORMAL HIGH (ref 70–99)
Potassium: 3.4 mmol/L — ABNORMAL LOW (ref 3.5–5.1)
Sodium: 135 mmol/L (ref 135–145)

## 2021-02-28 MED ORDER — POTASSIUM CHLORIDE CRYS ER 20 MEQ PO TBCR
60.0000 meq | EXTENDED_RELEASE_TABLET | Freq: Every day | ORAL | Status: DC
  Administered 2021-03-01: 60 meq via ORAL
  Filled 2021-02-28: qty 3

## 2021-02-28 NOTE — Progress Notes (Signed)
Occupational Therapy Treatment Patient Details Name: Pamela [name] MRN: [id number] DOB: 1956/04/08 Today's Date: 02/28/2021   History of present illness Pt is a 65 y.o. female admitted 02/08/21 with RLE swelling and erythema. Workup for RLE cellulitis, age-indeterminate RLE DVT. CXR with cardiomegaly, vascular congestion. PMH includes anxiety, depression, afib, CHF, DM2, HTN, rosacea, migraines, R knee DJD. Of note, admission 7/11-7/15/22 for GIB with discharge to SNF with recent return home 02/07/21.   OT comments  Focus of session on lateral transfer to drop arm recliner with min guard assist and educating pt in ADL from sitting position. Educated in option of functioning from a w/c level with drop arm DME and use of AE for LB ADL. Pt with mind-set of returning to baseline mobility, would likely require intensive rehab.    Recommendations for follow up therapy are one component of a multi-disciplinary discharge planning process, led by the attending physician.  Recommendations may be updated based on patient status, additional functional criteria and insurance authorization.    Follow Up Recommendations   (Inpatient rehab)    Equipment Recommendations  Wheelchair (measurements OT);Wheelchair cushion (measurements OT);Tub/shower bench (drop arm commode(bariatric))    Recommendations for Other Services      Precautions / Restrictions Precautions Precautions: Fall;Other (comment) Precaution Comments: Bladder/bowel incontinence       Mobility Bed Mobility Overal bed mobility: Modified Independent                  Transfers Overall transfer level: Needs assistance   Transfers: Lateral/Scoot Transfers          Lateral/Scoot Transfers: Min guard General transfer comment: bed to drop arm recliner    Balance Overall balance assessment: Needs assistance Sitting-balance support: No upper extremity supported;Feet supported Sitting balance-[name]: Good                                      ADL either performed or assessed with clinical judgement   ADL Overall ADL's : Needs assistance/impaired     Grooming: Wash/dry hands;Wash/dry face;Oral care;Sitting;Set up               Lower Body Dressing: Total assistance;Sitting/lateral leans Lower Body Dressing Details (indicate cue type and reason): assist to don, will educate in use of AE next visit Toilet Transfer: Min Financial planner Transfer Details (indicate cue type and reason): drop arm                 Vision       Perception     Praxis      Cognition Arousal/Alertness: Awake/alert Behavior During Therapy: WFL for tasks assessed/performed Overall Cognitive Status: Within Functional Limits for tasks assessed                                          Exercises     Shoulder Instructions       General Comments      Pertinent Vitals/ Pain       Pain Assessment: No/denies pain  Home Living                                          Prior Functioning/Environment  Frequency  Min 2X/week        Progress Toward Goals  OT Goals(current goals can now be found in the care plan section)  Progress towards OT goals: Progressing toward goals  Acute Rehab OT Goals Patient Stated Goal: to get up to chair and go to inpatient rehab OT Goal Formulation: With patient Time For Goal Achievement: 03/11/21 Potential to Achieve Goals: Good  Plan Discharge plan remains appropriate    Co-evaluation                 AM-PAC OT "6 Clicks" Daily Activity     Outcome Measure   Help from another person eating meals?: None Help from another person taking care of personal grooming?: A Little Help from another person toileting, which includes using toliet, bedpan, or urinal?: A Lot Help from another person bathing (including washing, rinsing, drying)?: A Lot Help from another person to put on and taking  off regular upper body clothing?: A Little Help from another person to put on and taking off regular lower body clothing?: Total 6 Click Score: 15    End of Session    OT Visit Diagnosis: Muscle weakness (generalized) (M62.81)   Activity Tolerance Patient tolerated treatment well   Patient Left in chair;with call bell/phone within reach   Nurse Communication Mobility status        Time: 9675-9163 OT Time Calculation (min): 58 min  Charges: OT General Charges $OT Visit: 1 Visit OT Treatments $Self Care/Home Management : 53-67 mins  Martie Round, OTR/L Acute Rehabilitation Services Pager: [phone] Office: [phone]   Evern Bio 02/28/2021, 10:45 AM

## 2021-02-28 NOTE — Progress Notes (Signed)
CSW sent updated PT notes to [place] at [facility].  Edwin Dada, MSW, LCSW Transitions of Care  Clinical Social Worker II [phone]

## 2021-02-28 NOTE — Progress Notes (Signed)
[facility] PROGRESS NOTE  Pamela [name]:[id number] DOB: June 21, 1955 DOA: 02/08/2021 PCP: Corwin Levins, MD  Status: Remains inpatient appropriate because:Unsafe d/c plan patient is unable to independently stand for more than 1 minute and we are continuing aggressive PT and OT ups of her increasing standing duration so she can transition to an inpatient rehabilitation program at [facility]  Dispo: The patient is from: Home              Anticipated d/c is to: [facility]              Patient currently is medically stable to d/c.   Difficult to place patient Yes              Barriers to discharge: Has utilized all insurance days for SNF rehab or acute rehab.  Will be eligible for Medicare next month and once obtains initial Medicare number IR will accept patient   Level of care: Telemetry Cardiac  Code Status: DNR Family Communication: Patient only DVT prophylaxis: Eliquis COVID vaccination status: Pfizer 01/24/2020, 04/03/2020.  Eligible for booster  HPI: 65 year old with past medical history significant for permanent A. fib on Eliquis, chronic diastolic heart failure, CKD stage IIIa, diabetes type 2, hypertension, hyperlipidemia, depression, anxiety presented to the ED for evaluation of right leg swelling. Recently hospitalized 12/17/2020 until 12/21/2020 for lower GI bleed related to diverticulosis and stercoral ulcer.  She was discharged to SNF.  Patient was sent home from skilled nursing facility on 02/07/2021 as her insurance will not pay for further days, patient continued to have knee and hip problems with generalized debility.  Also she did not get a prescription for Eliquis and had not taken it for 3 days.  On 9/1 she noticed increasing swelling and erythema to her right lower leg and presented for evaluation.   Subjective: Alert.  Denies any symptoms of pain or other problems.  Sided over progress made with therapies.  Objective: Vitals:   02/27/21 1932 02/28/21 0333  BP:  110/75 102/61  Pulse: 84 97  Resp: 20 (!) 8  Temp: 98.1 F (36.7 C) 98.1 F (36.7 C)  SpO2: 95% 91%    Intake/Output Summary (Last 24 hours) at 02/28/2021 0747 Last data filed at 02/28/2021 0334 Gross per 24 hour  Intake 914 ml  Output 1500 ml  Net -586 ml   Filed Weights   02/26/21 0359 02/27/21 0426 02/28/21 0333  Weight: (!) 152.1 kg (!) 151.8 kg (!) 150.8 kg    Exam: Constitutional: NAD, calm Respiratory: CTA without increased work of breathing, stable on room air, normal oximetry Cardiovascular: S1-S2, chronic lower extremity edema, normotensive, appropriate capillary refill Abdomen: Soft and nontender with normoactive bowel sounds. LBM 9/22  Tolerating oral diet Skin: + hemosiderin changes and superficial eschars on anterior surface of RLE Neurologic: CN 2-12 grossly intact. Sensation intact, DTR normal. Strength 5/5 x all 4 extremities.  Psychiatric: Normal judgment and insight. Alert and oriented x 3. Normal mood.    Assessment/Plan: Acute problems: Cellulitis of the right lower extremity age indeterminate right leg DVT: Doppler on 02/09/2021, age indeterminate right popliteal vein thrombosis.   Has completed 10 days of both IV and oral antibiotics (Ancef, cephalexin and doxycycline) Continue Eliquis.   Permanent A. fib:  Continue Eliquis Rate controlled on diltiazem and Toprol   General debility, chronic bilateral knee pain, right hip pain: Plan is to discharge to inpatient rehabilitation Patient would greatly benefit from intensive PT and OT in an inpatient rehabilitation  setting.  This would maximize her safety, independence and improve functional mobility.  Patient is very motivated to improve.  Based on therapy notes she is functioning significantly below her baseline   Mild renal insufficiency Peak creatinine 1.42 with a GFR of 41 on 9/5 and now back to baseline Baseline creatinine 0.89 through 1.24 with average GFR greater than 6 Stable monitor  Chronic  lower extremity edema Continue Lasix Renal function stable thus far with resumption of Jardiance   Hypokalemia:  Continues to have suboptimal potassium readings (< 4.0)  9/22: Increase from 40 mEq daily to 60 mEq daily   Diabetic neuropathy:  Continue with Lyrica   Type II Diabetes:  Patient was on Jardiance, Actos and glipizide prior to admission but was not on insulin. Continue preadmission medications as above GFR has recovered-HgbA1c June 2022 was 6.9  Nutrition BMI elevated without any evidence of malnutrition Continue carbohydrate modified diet Body mass index is 45.09 kg/m.    Stage II right buttock decubitus Wound / Incision (Open or Dehisced) 02/09/21 Non-pressure wound Tibial Posterior;Proximal;Right Open blister secondary to cellulitis (Active)  Date First Assessed/Time First Assessed: 02/09/21 1321   Wound Type: Non-pressure wound  Location: Tibial  Location Orientation: Posterior;Proximal;Right  Wound Description (Comments): Open blister secondary to cellulitis  Present on Admission: Yes    Assessments 02/09/2021  1:00 PM 02/24/2021  7:50 AM  Dressing Type Foam - Lift dressing to assess site every shift None  Dressing Changed New --  Dressing Status Dry;Intact;Clean --  Dressing Change Frequency PRN --  Wound Length (cm) 4 cm --  Wound Width (cm) 5 cm --  Wound Depth (cm) 0 cm --  Wound Volume (cm^3) 0 cm^3 --  Wound Surface Area (cm^2) 20 cm^2 --  Drainage Amount Minimal --  Drainage Description Sanguineous;No odor --     No Linked orders to display     Wound / Incision (Open or Dehisced) 02/09/21 (ITD) Intertriginous Dermatitis Buttocks Circumferential red and raised rash (Active)  Date First Assessed/Time First Assessed: 02/09/21 1323   Wound Type: (ITD) Intertriginous Dermatitis  Location: (c) Buttocks  Location Orientation: Circumferential  Wound Description (Comments): red and raised rash  Present on Admission: Yes    Assessments 02/09/2021  1:00 PM 02/26/2021   8:43 PM  Dressing Type Foam - Lift dressing to assess site every shift None  Dressing Changed New --  Dressing Status Dry;Intact;Clean --  Dressing Change Frequency PRN --  Site / Wound Assessment -- Clean;Dry;Pink  Drainage Amount None --  Treatment Cleansed --     No Linked orders to display     Pressure Injury 02/09/21 Buttocks Right Stage 2 -  Partial thickness loss of dermis presenting as a shallow open injury with a red, pink wound bed without slough. (Active)  Date First Assessed/Time First Assessed: 02/09/21 2059   Location: Buttocks  Location Orientation: Right  Staging: Stage 2 -  Partial thickness loss of dermis presenting as a shallow open injury with a red, pink wound bed without slough.  Present on A...    Assessments 02/09/2021  8:59 PM 02/27/2021  9:06 AM  Dressing Type Foam - Lift dressing to assess site every shift None  Site / Wound Assessment -- Pink;Dry     No Linked orders to display      Data Reviewed: Basic Metabolic Panel: Recent Labs  Lab 02/21/21 0822 02/26/21 0346 02/28/21 0418  NA 135 136 135  K 3.7 3.2* 3.4*  CL 93* 95* 97*  CO2 31 31 30   GLUCOSE 220* 138* 148*  BUN 16 16 18   CREATININE 1.07* 1.13* 1.44*  CALCIUM 9.3 9.3 9.1    CBC: Recent Labs  Lab 02/21/21 0822  WBC 9.4  NEUTROABS 6.8  HGB 10.0*  HCT 33.7*  MCV 84.9  PLT 271   Cardiac Enzymes: No results for input(s): CKTOTAL, CKMB, CKMBINDEX, TROPONINI in the last 168 hours. BNP (last 3 results) Recent Labs    02/08/21 2113  BNP 210.6*    ProBNP (last 3 results) Recent Labs    03/20/20 1145 06/21/20 1129  PROBNP 125.0* 139.0*    CBG: Recent Labs  Lab 02/27/21 0605 02/27/21 1121 02/27/21 1626 02/27/21 2100 02/28/21 0642  GLUCAP 103* 143* 156* 153* 148*     Scheduled Meds:  apixaban  5 mg Oral BID   atorvastatin  40 mg Oral Daily   diltiazem  360 mg Oral Daily   empagliflozin  25 mg Oral Daily   ferrous sulfate  325 mg Oral Q24H   furosemide  40 mg Oral  BID   Gerhardt's butt cream   Topical BID   glipiZIDE  10 mg Oral Q breakfast   insulin aspart  0-9 Units Subcutaneous TID WC   insulin aspart  3 Units Subcutaneous TID WC   metoprolol succinate  100 mg Oral Daily   pioglitazone  30 mg Oral Daily   potassium chloride  40 mEq Oral Daily   pregabalin  50 mg Oral TID   sodium chloride flush  3 mL Intravenous Q12H    Principal Problem:   Cellulitis of right lower extremity Active Problems:   Type 2 diabetes mellitus (HCC)   Hypertension associated with diabetes (HCC)   CKD (chronic kidney disease) stage 3, GFR 30-59 ml/min (HCC)   Chronic atrial fibrillation (HCC)   Chronic diastolic heart failure (HCC)   Hyperlipidemia associated with type 2 diabetes mellitus (HCC)   Pressure injury of skin   Consultants: None  Procedures: None  Antibiotics: IV cefazolin 9/2 through 9/6 PO cephalexin 9/7 through 9/12 PO doxycycline 9/8 through 9/12  Time spent: 35 minutes    11/8 ANP  [facility] 7 am - 330 pm/M-F for direct patient care and secure chat Please refer to Amion for contact info 16  days

## 2021-03-01 DIAGNOSIS — L03115 Cellulitis of right lower limb: Secondary | ICD-10-CM | POA: Diagnosis not present

## 2021-03-01 LAB — GLUCOSE, CAPILLARY
Glucose-Capillary: 155 mg/dL — ABNORMAL HIGH (ref 70–99)
Glucose-Capillary: 150 mg/dL — ABNORMAL HIGH (ref 70–99)
Glucose-Capillary: 126 mg/dL — ABNORMAL HIGH (ref 70–99)
Glucose-Capillary: 150 mg/dL — ABNORMAL HIGH (ref 70–99)
Glucose-Capillary: 151 mg/dL — ABNORMAL HIGH (ref 70–99)

## 2021-03-01 NOTE — Progress Notes (Signed)
[facility] PROGRESS NOTE  Pamela [name] PFX:[id number] DOB: 06-12-1955 DOA: 02/08/2021 PCP: Corwin Levins, MD  Status: Remains inpatient appropriate because:Unsafe d/c plan patient is unable to independently stand for more than 1 minute and we are continuing aggressive PT and OT ups of her increasing standing duration so she can transition to an inpatient rehabilitation program at [facility]  Dispo: The patient is from: Home              Anticipated d/c is to: [facility]              Patient currently is medically stable to d/c.   Difficult to place patient Yes              Barriers to discharge: Has utilized all insurance days for SNF rehab or acute rehab.  Will be eligible for Medicare next month and once obtains initial Medicare number IR will accept patient   Level of care: Telemetry Cardiac  Code Status: DNR Family Communication: Patient only DVT prophylaxis: Eliquis COVID vaccination status: Pfizer 01/24/2020, 04/03/2020.  Eligible for booster  HPI: 65 year old with past medical history significant for permanent A. fib on Eliquis, chronic diastolic heart failure, CKD stage IIIa, diabetes type 2, hypertension, hyperlipidemia, depression, anxiety presented to the ED for evaluation of right leg swelling. Recently hospitalized 12/17/2020 until 12/21/2020 for lower GI bleed related to diverticulosis and stercoral ulcer.  She was discharged to SNF.  Patient was sent home from skilled nursing facility on 02/07/2021 as her insurance will not pay for further days, patient continued to have knee and hip problems with generalized debility.  Also she did not get a prescription for Eliquis and had not taken it for 3 days.  On 9/1 she noticed increasing swelling and erythema to her right lower leg and presented for evaluation.   Subjective: Awakened from sleep.  Happy that therapies are now recommending inpatient rehabilitation.  He states that she prefers to go to [place]  rehab.  Objective: Vitals:   02/28/21 1941 03/01/21 0558  BP: 109/67 107/63  Pulse: 97 86  Resp: 17 18  Temp: 98.2 F (36.8 C) 98.6 F (37 C)  SpO2: 93% (!) 88%    Intake/Output Summary (Last 24 hours) at 03/01/2021 0735 Last data filed at 03/01/2021 0050 Gross per 24 hour  Intake 1200 ml  Output 2700 ml  Net -1500 ml   Filed Weights   02/27/21 0426 02/28/21 0333 03/01/21 0055  Weight: (!) 151.8 kg (!) 150.8 kg (!) 150.8 kg    Exam: Constitutional: NAD, calm Respiratory: CTA without increased work of breathing, stable on room air, normal oximetry Cardiovascular: S1-S2, chronic lower extremity edema, normotensive, appropriate capillary refill Abdomen: Soft and nontender with normoactive bowel sounds. LBM 9/22  Tolerating oral diet Skin: + hemosiderin changes and superficial eschars on anterior surface of RLE Neurologic: CN 2-12 grossly intact. Sensation intact, DTR normal. Strength 5/5 x all 4 extremities.  Psychiatric: Normal judgment and insight. Alert and oriented x 3. Normal mood.    Assessment/Plan: Acute problems: Cellulitis of the right lower extremity age indeterminate right leg DVT: Doppler on 02/09/2021, age indeterminate right popliteal vein thrombosis.   Has completed 10 days of antibiotics  Continue Eliquis.   Permanent A. fib:  Continue Eliquis Rate controlled on diltiazem and Toprol   General debility, chronic bilateral knee pain, right hip pain: Plan is to discharge to inpatient rehabilitation As of 9/22 OT recommending inpatient rehabilitation at time of discharge Patient would greatly  benefit from intensive PT and OT in an inpatient rehabilitation setting.  This would maximize her safety, independence and improve functional mobility.  Patient is very motivated to improve.  Based on therapy notes she is functioning significantly below her baseline   Mild renal insufficiency Peak creatinine 1.42 with a GFR of 41 on 9/5 and now back to baseline Baseline  creatinine 0.89 through 1.24 with average GFR greater than 6 Stable monitor  Chronic lower extremity edema Continue Lasix Renal function stable thus far with resumption of Jardiance   Hypokalemia:  Continues to have suboptimal potassium readings (< 4.0)  9/22: Increase from 40 mEq daily to 60 mEq daily   Diabetic neuropathy:  Continue with Lyrica   Type II Diabetes:  Patient was on Jardiance, Actos and glipizide prior to admission but was not on insulin. Continue preadmission medications as above GFR has recovered-HgbA1c June 2022 was 6.9  Nutrition BMI elevated without any evidence of malnutrition Continue carbohydrate modified diet Body mass index is 45.09 kg/m.    Stage II right buttock decubitus Wound / Incision (Open or Dehisced) 02/09/21 Non-pressure wound Tibial Posterior;Proximal;Right Open blister secondary to cellulitis (Active)  Date First Assessed/Time First Assessed: 02/09/21 1321   Wound Type: Non-pressure wound  Location: Tibial  Location Orientation: Posterior;Proximal;Right  Wound Description (Comments): Open blister secondary to cellulitis  Present on Admission: Yes    Assessments 02/09/2021  1:00 PM 02/28/2021  9:00 AM  Dressing Type Foam - Lift dressing to assess site every shift None  Dressing Changed New --  Dressing Status Dry;Intact;Clean --  Dressing Change Frequency PRN --  Site / Wound Assessment -- Pink;Dry  Wound Length (cm) 4 cm --  Wound Width (cm) 5 cm --  Wound Depth (cm) 0 cm --  Wound Volume (cm^3) 0 cm^3 --  Wound Surface Area (cm^2) 20 cm^2 --  Drainage Amount Minimal None  Drainage Description Sanguineous;No odor --     No Linked orders to display     Wound / Incision (Open or Dehisced) 02/09/21 (ITD) Intertriginous Dermatitis Buttocks Circumferential red and raised rash (Active)  Date First Assessed/Time First Assessed: 02/09/21 1323   Wound Type: (ITD) Intertriginous Dermatitis  Location: (c) Buttocks  Location Orientation:  Circumferential  Wound Description (Comments): red and raised rash  Present on Admission: Yes    Assessments 02/09/2021  1:00 PM 02/28/2021  9:00 AM  Dressing Type Foam - Lift dressing to assess site every shift --  Dressing Changed New --  Dressing Status Dry;Intact;Clean --  Dressing Change Frequency PRN --  Site / Wound Assessment -- Pink  Drainage Amount None --  Treatment Cleansed --     No Linked orders to display     Pressure Injury 02/09/21 Buttocks Right Stage 2 -  Partial thickness loss of dermis presenting as a shallow open injury with a red, pink wound bed without slough. (Active)  Date First Assessed/Time First Assessed: 02/09/21 2059   Location: Buttocks  Location Orientation: Right  Staging: Stage 2 -  Partial thickness loss of dermis presenting as a shallow open injury with a red, pink wound bed without slough.  Present on A...    Assessments 02/09/2021  8:59 PM 02/28/2021  9:21 PM  Dressing Type Foam - Lift dressing to assess site every shift None  Dressing -- Intact;Dry;Clean     No Linked orders to display      Data Reviewed: Basic Metabolic Panel: Recent Labs  Lab 02/26/21 0346 02/28/21 0418  NA 136 135  K 3.2* 3.4*  CL 95* 97*  CO2 31 30  GLUCOSE 138* 148*  BUN 16 18  CREATININE 1.13* 1.44*  CALCIUM 9.3 9.1    CBC: No results for input(s): WBC, NEUTROABS, HGB, HCT, MCV, PLT in the last 168 hours.  Cardiac Enzymes: No results for input(s): CKTOTAL, CKMB, CKMBINDEX, TROPONINI in the last 168 hours. BNP (last 3 results) Recent Labs    02/08/21 2113  BNP 210.6*    ProBNP (last 3 results) Recent Labs    03/20/20 1145 06/21/20 1129  PROBNP 125.0* 139.0*    CBG: Recent Labs  Lab 02/28/21 0642 02/28/21 1106 02/28/21 1614 02/28/21 2102 03/01/21 0555  GLUCAP 148* 155* 148* 148* 155*     Scheduled Meds:  apixaban  5 mg Oral BID   atorvastatin  40 mg Oral Daily   diltiazem  360 mg Oral Daily   empagliflozin  25 mg Oral Daily   ferrous  sulfate  325 mg Oral Q24H   furosemide  40 mg Oral BID   Gerhardt's butt cream   Topical BID   glipiZIDE  10 mg Oral Q breakfast   insulin aspart  0-9 Units Subcutaneous TID WC   insulin aspart  3 Units Subcutaneous TID WC   metoprolol succinate  100 mg Oral Daily   pioglitazone  30 mg Oral Daily   potassium chloride  60 mEq Oral Daily   pregabalin  50 mg Oral TID   sodium chloride flush  3 mL Intravenous Q12H    Principal Problem:   Cellulitis of right lower extremity Active Problems:   Type 2 diabetes mellitus (HCC)   Hypertension associated with diabetes (HCC)   CKD (chronic kidney disease) stage 3, GFR 30-59 ml/min (HCC)   Chronic atrial fibrillation (HCC)   Chronic diastolic heart failure (HCC)   Hyperlipidemia associated with type 2 diabetes mellitus (HCC)   Pressure injury of skin   Consultants: None  Procedures: None  Antibiotics: IV cefazolin 9/2 through 9/6 PO cephalexin 9/7 through 9/12 PO doxycycline 9/8 through 9/12  Time spent: 35 minutes    Junious Silk ANP  [facility] 7 am - 330 pm/M-F for direct patient care and secure chat Please refer to Amion for contact info 17  days

## 2021-03-01 NOTE — Progress Notes (Addendum)
CSW spoke with Verdon Cummins of [facility] who states she is closely monitoring the patient's PT/OT progress and will submit for insurance authorization when appropriate.  CSW spoke with patient at bedside to inform her of updates regarding her discharge plan.  Edwin Dada, MSW, LCSW Transitions of Care  Clinical Social Worker II [phone]

## 2021-03-01 NOTE — Plan of Care (Signed)
  Problem: Education: Goal: Knowledge of General Education information will improve Description Including pain rating scale, medication(s)/side effects and non-pharmacologic comfort measures Outcome: Progressing   

## 2021-03-02 DIAGNOSIS — I482 Chronic atrial fibrillation, unspecified: Secondary | ICD-10-CM

## 2021-03-02 DIAGNOSIS — N179 Acute kidney failure, unspecified: Secondary | ICD-10-CM | POA: Diagnosis not present

## 2021-03-02 DIAGNOSIS — E1159 Type 2 diabetes mellitus with other circulatory complications: Secondary | ICD-10-CM

## 2021-03-02 DIAGNOSIS — I872 Venous insufficiency (chronic) (peripheral): Secondary | ICD-10-CM

## 2021-03-02 DIAGNOSIS — I152 Hypertension secondary to endocrine disorders: Secondary | ICD-10-CM

## 2021-03-02 DIAGNOSIS — L03115 Cellulitis of right lower limb: Secondary | ICD-10-CM | POA: Diagnosis not present

## 2021-03-02 DIAGNOSIS — D649 Anemia, unspecified: Secondary | ICD-10-CM

## 2021-03-02 DIAGNOSIS — I5032 Chronic diastolic (congestive) heart failure: Secondary | ICD-10-CM | POA: Diagnosis not present

## 2021-03-02 DIAGNOSIS — M8949 Other hypertrophic osteoarthropathy, multiple sites: Secondary | ICD-10-CM

## 2021-03-02 DIAGNOSIS — R5381 Other malaise: Secondary | ICD-10-CM

## 2021-03-02 DIAGNOSIS — E11610 Type 2 diabetes mellitus with diabetic neuropathic arthropathy: Secondary | ICD-10-CM

## 2021-03-02 DIAGNOSIS — L89302 Pressure ulcer of unspecified buttock, stage 2: Secondary | ICD-10-CM

## 2021-03-02 LAB — BASIC METABOLIC PANEL
Anion gap: 9 (ref 5–15)
BUN: 22 mg/dL (ref 8–23)
CO2: 29 mmol/L (ref 22–32)
Calcium: 9.4 mg/dL (ref 8.9–10.3)
Chloride: 97 mmol/L — ABNORMAL LOW (ref 98–111)
Creatinine, Ser: 1.38 mg/dL — ABNORMAL HIGH (ref 0.44–1.00)
GFR, Estimated: 42 mL/min — ABNORMAL LOW (ref >60)
Glucose, Bld: 131 mg/dL — ABNORMAL HIGH (ref 70–99)
Potassium: 3.3 mmol/L — ABNORMAL LOW (ref 3.5–5.1)
Sodium: 135 mmol/L (ref 135–145)

## 2021-03-02 LAB — GLUCOSE, CAPILLARY
Glucose-Capillary: 123 mg/dL — ABNORMAL HIGH (ref 70–99)
Glucose-Capillary: 120 mg/dL — ABNORMAL HIGH (ref 70–99)
Glucose-Capillary: 145 mg/dL — ABNORMAL HIGH (ref 70–99)
Glucose-Capillary: 189 mg/dL — ABNORMAL HIGH (ref 70–99)

## 2021-03-02 MED ORDER — DILTIAZEM HCL ER COATED BEADS 180 MG PO CP24
300.0000 mg | ORAL_CAPSULE | Freq: Every day | ORAL | Status: DC
  Administered 2021-03-03: 300 mg via ORAL
  Filled 2021-03-02: qty 1

## 2021-03-02 MED ORDER — POTASSIUM CHLORIDE CRYS ER 20 MEQ PO TBCR
40.0000 meq | EXTENDED_RELEASE_TABLET | ORAL | Status: AC
  Administered 2021-03-02 (×2): 40 meq via ORAL
  Filled 2021-03-02 (×2): qty 2

## 2021-03-02 MED ORDER — POTASSIUM CHLORIDE CRYS ER 20 MEQ PO TBCR
40.0000 meq | EXTENDED_RELEASE_TABLET | Freq: Every day | ORAL | Status: DC
  Administered 2021-03-03 – 2021-03-06 (×4): 40 meq via ORAL
  Filled 2021-03-02 (×5): qty 2

## 2021-03-02 NOTE — Progress Notes (Signed)
PROGRESS NOTE  Pamela [name] ZOX:[id number] DOB: 1956-04-03   PCP: Corwin Levins, MD  Patient is from: Home.  Recently discharged from SNF to home.  She lives alone.  DOA: 02/08/2021 LOS: 18  Chief complaints:  Chief Complaint  Patient presents with   Shortness of Breath     Brief Narrative / Interim history: 65 year old F with PMH of permanent A. fib on Eliquis, diastolic CHF, CKD-3A, DM-2, GIB, HTN, HLD, anxiety, depression and recent COVID-19 infection presenting from home with RLE swelling and debility in the setting of knee and hip problem.  Patient was discharged to SNF after recent hospitalization from 7/11-12/2013 for GIB due to diverticulosis and a stercoral ulcer, and discharged home on 02/07/2021 as her insurance was not paying for further care.  She completed 10 days of antibiotic for possible cellulitis, and waiting on CIR at [facility] for disposition.  Subjective: Seen and examined earlier this morning.  No major events overnight of this morning.  She has no complaints.  She says she feels better.  She says she transferred from bed to bedside chair on her own yesterday.  She denies pain, shortness of breath, GI or UTI symptoms.  Objective: Vitals:   03/02/21 0305 03/02/21 0900 03/02/21 1131 03/02/21 1157  BP: (!) 102/52  127/65 109/71  Pulse: 91 68 76 95  Resp: 14 16 18 16   Temp: 98.1 F (36.7 C)  97.8 F (36.6 C) 97.8 F (36.6 C)  TempSrc: Oral  Oral Oral  SpO2: 94%  95% 95%  Weight: (!) 151.9 kg     Height:        Intake/Output Summary (Last 24 hours) at 03/02/2021 1507 Last data filed at 03/02/2021 1100 Gross per 24 hour  Intake 840 ml  Output 2250 ml  Net -1410 ml   Filed Weights   02/28/21 0333 03/01/21 0055 03/02/21 0305  Weight: (!) 150.8 kg (!) 150.8 kg (!) 151.9 kg    Examination:  GENERAL: No apparent distress.  Nontoxic. HEENT: MMM.  Vision and hearing grossly intact.  NECK: Supple.  No apparent JVD.  RESP: 94% on RA.  No IWOB.  Fair  aeration bilaterally. CVS:  RRR. Heart sounds normal.  ABD/GI/GU: BS+. Abd soft, NTND.  MSK/EXT:  Moves extremities. 1+ edema in BLE.  Erythema over lower third of her RLE. SKIN: no apparent skin lesion or wound NEURO: Awake, alert and oriented appropriately.  No apparent focal neuro deficit. PSYCH: Calm. Normal affect.   Procedures:  None  Microbiology summarized: COVID-19 and influenza PCR nonreactive.  Assessment & Plan: RLE cellulitis in the setting of chronic venous insufficiency/edema-completed 10 days of antibiotics.  -Discontinue Actos-causes fluid retention. -Decrease Cardizem CD to 300 mg-this could also contribute to her edema. -Continue Lasix and Jardiance -Leg elevation.  Chronic RLE DVT -Continue Eliquis.   Permanent A. fib: Rate controlled. -Continue Eliquis and Toprol-XL -Decrease Cardizem CD to 300 mg daily-prefer to use lowest possible dose in the setting of edema   Generalized weakness/ambulatory dysfunction in the setting of chronic b/l knee and right hip pain -Continue therapy -Eventually transferred to CIR for aggressive therapy.   AKI: From diuretics?  Improving. Recent Labs    02/15/21 0802 02/16/21 0828 02/17/21 0755 02/18/21 0831 02/19/21 0823 02/20/21 1214 02/21/21 0822 02/26/21 0346 02/28/21 0418 03/02/21 0347  BUN 12 12 13 17 16 17 16 16 18 22   CREATININE 1.00 1.05* 0.95 1.24* 0.95 0.89 1.07* 1.13* 1.44* 1.38*  -Continue monitoring    Hypokalemia:  K3.3. -K-Dur 40 mill equivalent x2   Controlled DM-2 with hyperglycemia and neuropathy: A1c 6.9% in 11/2020. Recent Labs  Lab 03/01/21 1140 03/01/21 1603 03/01/21 2103 03/02/21 0553 03/02/21 1113  GLUCAP 126* 150* 151* 123* 120*  -Check hemoglobin A1c -Continue current insulin regimen plus home glipizide and Jardiance. -Discontinued Actos in the setting of edema -May consider adding GLP 1 agonist in the setting of morbid obesity -Continue home statin and Lyrica. -Recheck  hemoglobin A1c    Morbid obesity Body mass index is 45.42 kg/m. -Encourage lifestyle change to lose weight. -Consider adding GLP-1 agonist in the setting of DM-2      Stage II right buttock decubitus Pressure Injury 02/09/21 Buttocks Right Stage 2 -  Partial thickness loss of dermis presenting as a shallow open injury with a red, pink wound bed without slough. (Active)  02/09/21 2059  Location: Buttocks  Location Orientation: Right  Staging: Stage 2 -  Partial thickness loss of dermis presenting as a shallow open injury with a red, pink wound bed without slough.  Wound Description (Comments):   Present on Admission: Yes   DVT prophylaxis:   apixaban (ELIQUIS) tablet 5 mg  Code Status: DNR/DNI Family Communication: Patient and/or RN. Available if any question.  Level of care: Telemetry Cardiac Status is: Inpatient  Remains inpatient appropriate because:Unsafe d/c plan  Dispo: The patient is from: Home              Anticipated d/c is to: CIR              Patient currently is medically stable to d/c.   Difficult to place patient Yes       Consultants:  None   Sch Meds:  Scheduled Meds:  apixaban  5 mg Oral BID   atorvastatin  40 mg Oral Daily   diltiazem  360 mg Oral Daily   empagliflozin  25 mg Oral Daily   ferrous sulfate  325 mg Oral Q24H   furosemide  40 mg Oral BID   Gerhardt's butt cream   Topical BID   glipiZIDE  10 mg Oral Q breakfast   insulin aspart  0-9 Units Subcutaneous TID WC   insulin aspart  3 Units Subcutaneous TID WC   metoprolol succinate  100 mg Oral Daily   pregabalin  50 mg Oral TID   sodium chloride flush  3 mL Intravenous Q12H   Continuous Infusions: PRN Meds:.acetaminophen **OR** acetaminophen, albuterol, loperamide, nystatin, ondansetron **OR** ondansetron (ZOFRAN) IV, senna-docusate  Antimicrobials: Anti-infectives (From admission, onward)    Start     Dose/Rate Route Frequency Ordered Stop   02/18/21 0000  doxycycline  (VIBRA-TABS) 100 MG tablet  Status:  Discontinued        100 mg Oral Every 12 hours 02/18/21 1257 02/20/21    02/17/21 2200  cephALEXin (KEFLEX) capsule 500 mg        500 mg Oral Every 8 hours 02/17/21 1449 02/18/21 2218   02/16/21 1400  cephALEXin (KEFLEX) capsule 500 mg  Status:  Discontinued        500 mg Oral Every 8 hours 02/16/21 1200 02/17/21 1449   02/14/21 1000  doxycycline (VIBRA-TABS) tablet 100 mg        100 mg Oral Every 12 hours 02/14/21 0849 02/18/21 2217   02/13/21 1400  cephALEXin (KEFLEX) capsule 250 mg  Status:  Discontinued        250 mg Oral Every 8 hours 02/13/21 1005 02/16/21 1200   02/09/21 0030  ceFAZolin (ANCEF) IVPB 2g/100 mL premix  Status:  Discontinued        2 g 200 mL/hr over 30 Minutes Intravenous Every 8 hours 02/09/21 0016 02/13/21 1005        I have personally reviewed the following labs and images: CBC: No results for input(s): WBC, NEUTROABS, HGB, HCT, MCV, PLT in the last 168 hours. BMP &GFR Recent Labs  Lab 02/26/21 0346 02/28/21 0418 03/02/21 0347  NA 136 135 135  K 3.2* 3.4* 3.3*  CL 95* 97* 97*  CO2 31 30 29   GLUCOSE 138* 148* 131*  BUN 16 18 22   CREATININE 1.13* 1.44* 1.38*  CALCIUM 9.3 9.1 9.4   Estimated Creatinine Clearance: 67.1 mL/min (A) (by C-G formula based on SCr of 1.38 mg/dL (H)). Liver & Pancreas: No results for input(s): AST, ALT, ALKPHOS, BILITOT, PROT, ALBUMIN in the last 168 hours. No results for input(s): LIPASE, AMYLASE in the last 168 hours. No results for input(s): AMMONIA in the last 168 hours. Diabetic: No results for input(s): HGBA1C in the last 72 hours. Recent Labs  Lab 03/01/21 1140 03/01/21 1603 03/01/21 2103 03/02/21 0553 03/02/21 1113  GLUCAP 126* 150* 151* 123* 120*   Cardiac Enzymes: No results for input(s): CKTOTAL, CKMB, CKMBINDEX, TROPONINI in the last 168 hours. Recent Labs    03/20/20 1145 06/21/20 1129  PROBNP 125.0* 139.0*   Coagulation Profile: No results for input(s): INR,  PROTIME in the last 168 hours. Thyroid Function Tests: No results for input(s): TSH, T4TOTAL, FREET4, T3FREE, THYROIDAB in the last 72 hours. Lipid Profile: No results for input(s): CHOL, HDL, LDLCALC, TRIG, CHOLHDL, LDLDIRECT in the last 72 hours. Anemia Panel: No results for input(s): VITAMINB12, FOLATE, FERRITIN, TIBC, IRON, RETICCTPCT in the last 72 hours. Urine analysis:    Component Value Date/Time   COLORURINE YELLOW 06/21/2020 1129   APPEARANCEUR CLEAR 06/21/2020 1129   LABSPEC 1.010 06/21/2020 1129   PHURINE 5.5 06/21/2020 1129   GLUCOSEU >=1000 (A) 06/21/2020 1129   HGBUR NEGATIVE 06/21/2020 1129   BILIRUBINUR NEGATIVE 06/21/2020 1129   KETONESUR NEGATIVE 06/21/2020 1129   UROBILINOGEN 0.2 06/21/2020 1129   NITRITE NEGATIVE 06/21/2020 1129   LEUKOCYTESUR NEGATIVE 06/21/2020 1129   Sepsis Labs: Invalid input(s): PROCALCITONIN, LACTICIDVEN  Microbiology: No results found for this or any previous visit (from the past 240 hour(s)).  Radiology Studies: No results found.  45 minutes with more than 50% spent in reviewing records, counseling patient/family and coordinating care.   Verlyn Dannenberg T. Leylany Nored [facility]  If 7PM-7AM, please contact night-coverage [email or web address] 03/02/2021, 3:07 PM

## 2021-03-03 DIAGNOSIS — I5032 Chronic diastolic (congestive) heart failure: Secondary | ICD-10-CM | POA: Diagnosis not present

## 2021-03-03 DIAGNOSIS — L03115 Cellulitis of right lower limb: Secondary | ICD-10-CM | POA: Diagnosis not present

## 2021-03-03 DIAGNOSIS — N179 Acute kidney failure, unspecified: Secondary | ICD-10-CM | POA: Diagnosis not present

## 2021-03-03 DIAGNOSIS — I482 Chronic atrial fibrillation, unspecified: Secondary | ICD-10-CM | POA: Diagnosis not present

## 2021-03-03 LAB — GLUCOSE, CAPILLARY
Glucose-Capillary: 130 mg/dL — ABNORMAL HIGH (ref 70–99)
Glucose-Capillary: 123 mg/dL — ABNORMAL HIGH (ref 70–99)
Glucose-Capillary: 139 mg/dL — ABNORMAL HIGH (ref 70–99)
Glucose-Capillary: 191 mg/dL — ABNORMAL HIGH (ref 70–99)

## 2021-03-03 LAB — BRAIN NATRIURETIC PEPTIDE: B Natriuretic Peptide: 92.4 pg/mL (ref 0.0–100.0)

## 2021-03-03 LAB — RETICULOCYTES
Immature Retic Fract: 30.1 % — ABNORMAL HIGH (ref 2.3–15.9)
RBC.: 4.37 MIL/uL (ref 3.87–5.11)
Retic Count, Absolute: 128 10*3/uL (ref 19.0–186.0)
Retic Ct Pct: 2.9 % (ref 0.4–3.1)

## 2021-03-03 LAB — RENAL FUNCTION PANEL
Albumin: 2.8 g/dL — ABNORMAL LOW (ref 3.5–5.0)
Anion gap: 9 (ref 5–15)
BUN: 21 mg/dL (ref 8–23)
CO2: 29 mmol/L (ref 22–32)
Calcium: 9.1 mg/dL (ref 8.9–10.3)
Chloride: 99 mmol/L (ref 98–111)
Creatinine, Ser: 1.32 mg/dL — ABNORMAL HIGH (ref 0.44–1.00)
GFR, Estimated: 45 mL/min — ABNORMAL LOW (ref >60)
Glucose, Bld: 157 mg/dL — ABNORMAL HIGH (ref 70–99)
Phosphorus: 4.5 mg/dL (ref 2.5–4.6)
Potassium: 3.6 mmol/L (ref 3.5–5.1)
Sodium: 137 mmol/L (ref 135–145)

## 2021-03-03 LAB — IRON AND TIBC
Iron: 68 ug/dL (ref 28–170)
Saturation Ratios: 20 % (ref 10.4–31.8)
TIBC: 346 ug/dL (ref 250–450)
UIBC: 278 ug/dL

## 2021-03-03 LAB — CBC
HCT: 37.2 % (ref 36.0–46.0)
Hemoglobin: 11.2 g/dL — ABNORMAL LOW (ref 12.0–15.0)
MCH: 25.5 pg — ABNORMAL LOW (ref 26.0–34.0)
MCHC: 30.1 g/dL (ref 30.0–36.0)
MCV: 84.7 fL (ref 80.0–100.0)
Platelets: 356 10*3/uL (ref 150–400)
RBC: 4.39 MIL/uL (ref 3.87–5.11)
RDW: 17.5 % — ABNORMAL HIGH (ref 11.5–15.5)
WBC: 11.6 10*3/uL — ABNORMAL HIGH (ref 4.0–10.5)
nRBC: 0 % (ref 0.0–0.2)

## 2021-03-03 LAB — HEMOGLOBIN A1C
Hgb A1c MFr Bld: 7.9 % — ABNORMAL HIGH (ref 4.8–5.6)
Mean Plasma Glucose: 180.03 mg/dL

## 2021-03-03 LAB — CK: Total CK: 15 U/L — ABNORMAL LOW (ref 38–234)

## 2021-03-03 LAB — FOLATE: Folate: 6.5 ng/mL (ref >5.9)

## 2021-03-03 LAB — TSH: TSH: 1.828 u[IU]/mL (ref 0.350–4.500)

## 2021-03-03 LAB — FERRITIN: Ferritin: 32 ng/mL (ref 11–307)

## 2021-03-03 LAB — VITAMIN B12: Vitamin B-12: 253 pg/mL (ref 180–914)

## 2021-03-03 LAB — MAGNESIUM: Magnesium: 2.2 mg/dL (ref 1.7–2.4)

## 2021-03-03 MED ORDER — DILTIAZEM HCL ER COATED BEADS 180 MG PO CP24
360.0000 mg | ORAL_CAPSULE | Freq: Every day | ORAL | Status: DC
  Administered 2021-03-04 – 2021-03-19 (×16): 360 mg via ORAL
  Filled 2021-03-03 (×17): qty 2

## 2021-03-03 MED ORDER — POTASSIUM CHLORIDE CRYS ER 20 MEQ PO TBCR
40.0000 meq | EXTENDED_RELEASE_TABLET | Freq: Once | ORAL | Status: AC
  Administered 2021-03-03: 40 meq via ORAL

## 2021-03-03 MED ORDER — METFORMIN HCL 500 MG PO TABS
500.0000 mg | ORAL_TABLET | Freq: Two times a day (BID) | ORAL | Status: DC
  Administered 2021-03-03 – 2021-03-19 (×32): 500 mg via ORAL
  Filled 2021-03-03 (×33): qty 1

## 2021-03-03 NOTE — Progress Notes (Signed)
PROGRESS NOTE  Pamela [name]:[id number] DOB: Oct 29, 1955   PCP: Corwin Levins, MD  Patient is from: Home.  Recently discharged from SNF to home.  She lives alone.  DOA: 02/08/2021 LOS: 19  Chief complaints:  Chief Complaint  Patient presents with   Shortness of Breath     Brief Narrative / Interim history: 65 year old F with PMH of permanent A. fib on Eliquis, diastolic CHF, CKD-3A, DM-2, GIB, HTN, HLD, anxiety, depression and recent COVID-19 infection presenting from home with RLE swelling and debility in the setting of knee and hip problem.  Patient was discharged to SNF after recent hospitalization from 7/11-12/2013 for GIB due to diverticulosis and a stercoral ulcer, and discharged home on 02/07/2021 as her insurance was not paying for further care.  She completed 10 days of antibiotic for possible cellulitis, and waiting on CIR at [facility] for disposition.  Subjective: Seen and examined earlier this morning.  No major events overnight of this morning.  No complaints.  Objective: Vitals:   03/02/21 1655 03/02/21 2054 03/03/21 0503 03/03/21 0910  BP: (!) 112/56 124/75 119/77 126/69  Pulse: 88 97 (!) 102 (!) 106  Resp: 16 18 18    Temp:  98.6 F (37 C) 98.4 F (36.9 C)   TempSrc:  Oral Oral   SpO2: 100% 91% 94%   Weight:   (!) 151 kg   Height:        Intake/Output Summary (Last 24 hours) at 03/03/2021 1354 Last data filed at 03/03/2021 1120 Gross per 24 hour  Intake 1080 ml  Output 2400 ml  Net -1320 ml   Filed Weights   03/01/21 0055 03/02/21 0305 03/03/21 0503  Weight: (!) 150.8 kg (!) 151.9 kg (!) 151 kg    Examination:  GENERAL: No apparent distress.  Nontoxic. HEENT: MMM.  Vision and hearing grossly intact.  NECK: Supple.  No apparent JVD.  RESP: 94% on RA.  No IWOB.  Fair aeration bilaterally. CVS:  RRR. Heart sounds normal.  ABD/GI/GU: BS+. Abd soft, NTND.  MSK/EXT:  Moves extremities. No apparent deformity.  Trace edema in BLE. SKIN: Small  erythema over right shin.  No increased warmth to touch. NEURO: Awake and alert. Oriented appropriately.  No apparent focal neuro deficit. PSYCH: Calm. Normal affect.   Procedures:  None  Microbiology summarized: COVID-19 and influenza PCR nonreactive.  Assessment & Plan: RLE cellulitis in the setting of chronic venous insufficiency/edema.   -completed 10 days of antibiotics.  -Discontinue Actos-causes fluid retention. -Continue Lasix and Jardiance -Leg elevation.  Chronic RLE DVT -Continue Eliquis.   Permanent A. fib: Rate controlled.  On high-dose Cardizem CD and Toprol-XL.  -Continue home Cardizem, Toprol-XL and Eliquis. -Would have been ideal to be off Cardizem but no great alternative   Generalized weakness/ambulatory dysfunction in the setting of chronic b/l knee and right hip pain -Continue therapy -Eventually transferred to CIR for aggressive therapy.   AKI: From diuretics?  Improving. Recent Labs    02/16/21 0828 02/17/21 0755 02/18/21 0831 02/19/21 0823 02/20/21 1214 02/21/21 0822 02/26/21 0346 02/28/21 0418 03/02/21 0347 03/03/21 0251  BUN 12 13 17 16 17 16 16 18 22 21   CREATININE 1.05* 0.95 1.24* 0.95 0.89 1.07* 1.13* 1.44* 1.38* 1.32*  -Continue monitoring  Hypokalemia: Improved.  K3.6. -K-Dur 40 mill equivalent x2   Controlled DM-2 with hyperglycemia and neuropathy: A1c 7.9% (was 6.9% in 11/2020).  On Actos, glipizide, Jardiance and Lantus.  She also reports taking metformin which is not listed.  Recent Labs  Lab 03/02/21 1113 03/02/21 1622 03/02/21 2054 03/03/21 0629 03/03/21 1150  GLUCAP 120* 145* 189* 130* 123*  -Continue current insulin regimen plus home glipizide and Jardiance. -Add metformin.  500 mg twice daily given her renal function. -Discontinued Actos in the setting of edema and CHF -May consider adding GLP 1 agonist in the setting of morbid obesity -Continue home statin and Lyrica.   Morbid obesity Body mass index is 45.15  kg/m. -Encourage lifestyle change to lose weight. -Add metformin as above. -Consider adding GLP-1 agonist in the setting of DM-2      Stage II right buttock decubitus Pressure Injury 02/09/21 Buttocks Right Stage 2 -  Partial thickness loss of dermis presenting as a shallow open injury with a red, pink wound bed without slough. (Active)  02/09/21 2059  Location: Buttocks  Location Orientation: Right  Staging: Stage 2 -  Partial thickness loss of dermis presenting as a shallow open injury with a red, pink wound bed without slough.  Wound Description (Comments):   Present on Admission: Yes   DVT prophylaxis:   apixaban (ELIQUIS) tablet 5 mg  Code Status: DNR/DNI Family Communication: Patient and/or RN. Available if any question.  Level of care: Telemetry Cardiac Status is: Inpatient  Remains inpatient appropriate because:Unsafe d/c plan  Dispo: The patient is from: Home              Anticipated d/c is to: CIR              Patient currently is medically stable to d/c.   Difficult to place patient Yes       Consultants:  None   Sch Meds:  Scheduled Meds:  apixaban  5 mg Oral BID   atorvastatin  40 mg Oral Daily   [START ON 03/04/2021] diltiazem  360 mg Oral Daily   empagliflozin  25 mg Oral Daily   ferrous sulfate  325 mg Oral Q24H   furosemide  40 mg Oral BID   Gerhardt's butt cream   Topical BID   glipiZIDE  10 mg Oral Q breakfast   insulin aspart  0-9 Units Subcutaneous TID WC   insulin aspart  3 Units Subcutaneous TID WC   metFORMIN  500 mg Oral BID WC   metoprolol succinate  100 mg Oral Daily   potassium chloride  40 mEq Oral Daily   pregabalin  50 mg Oral TID   sodium chloride flush  3 mL Intravenous Q12H   Continuous Infusions: PRN Meds:.acetaminophen **OR** acetaminophen, albuterol, loperamide, nystatin, ondansetron **OR** ondansetron (ZOFRAN) IV, senna-docusate  Antimicrobials: Anti-infectives (From admission, onward)    Start     Dose/Rate Route  Frequency Ordered Stop   02/18/21 0000  doxycycline (VIBRA-TABS) 100 MG tablet  Status:  Discontinued        100 mg Oral Every 12 hours 02/18/21 1257 02/20/21    02/17/21 2200  cephALEXin (KEFLEX) capsule 500 mg        500 mg Oral Every 8 hours 02/17/21 1449 02/18/21 2218   02/16/21 1400  cephALEXin (KEFLEX) capsule 500 mg  Status:  Discontinued        500 mg Oral Every 8 hours 02/16/21 1200 02/17/21 1449   02/14/21 1000  doxycycline (VIBRA-TABS) tablet 100 mg        100 mg Oral Every 12 hours 02/14/21 0849 02/18/21 2217   02/13/21 1400  cephALEXin (KEFLEX) capsule 250 mg  Status:  Discontinued        250 mg  Oral Every 8 hours 02/13/21 1005 02/16/21 1200   02/09/21 0030  ceFAZolin (ANCEF) IVPB 2g/100 mL premix  Status:  Discontinued        2 g 200 mL/hr over 30 Minutes Intravenous Every 8 hours 02/09/21 0016 02/13/21 1005        I have personally reviewed the following labs and images: CBC: Recent Labs  Lab 03/03/21 0251  WBC 11.6*  HGB 11.2*  HCT 37.2  MCV 84.7  PLT 356   BMP &GFR Recent Labs  Lab 02/26/21 0346 02/28/21 0418 03/02/21 0347 03/03/21 0251  NA 136 135 135 137  K 3.2* 3.4* 3.3* 3.6  CL 95* 97* 97* 99  CO2 31 30 29 29   GLUCOSE 138* 148* 131* 157*  BUN 16 18 22 21   CREATININE 1.13* 1.44* 1.38* 1.32*  CALCIUM 9.3 9.1 9.4 9.1  MG  --   --   --  2.2  PHOS  --   --   --  4.5   Estimated Creatinine Clearance: 70 mL/min (A) (by C-G formula based on SCr of 1.32 mg/dL (H)). Liver & Pancreas: Recent Labs  Lab 03/03/21 0251  ALBUMIN 2.8*   No results for input(s): LIPASE, AMYLASE in the last 168 hours. No results for input(s): AMMONIA in the last 168 hours. Diabetic: Recent Labs    03/03/21 0251  HGBA1C 7.9*   Recent Labs  Lab 03/02/21 1113 03/02/21 1622 03/02/21 2054 03/03/21 0629 03/03/21 1150  GLUCAP 120* 145* 189* 130* 123*   Cardiac Enzymes: Recent Labs  Lab 03/03/21 0251  CKTOTAL 15*   Recent Labs    03/20/20 1145 06/21/20 1129   PROBNP 125.0* 139.0*   Coagulation Profile: No results for input(s): INR, PROTIME in the last 168 hours. Thyroid Function Tests: Recent Labs    03/03/21 0251  TSH 1.828   Lipid Profile: No results for input(s): CHOL, HDL, LDLCALC, TRIG, CHOLHDL, LDLDIRECT in the last 72 hours. Anemia Panel: Recent Labs    03/03/21 0251  VITAMINB12 253  FOLATE 6.5  FERRITIN 32  TIBC 346  IRON 68  RETICCTPCT 2.9   Urine analysis:    Component Value Date/Time   COLORURINE YELLOW 06/21/2020 1129   APPEARANCEUR CLEAR 06/21/2020 1129   LABSPEC 1.010 06/21/2020 1129   PHURINE 5.5 06/21/2020 1129   GLUCOSEU >=1000 (A) 06/21/2020 1129   HGBUR NEGATIVE 06/21/2020 1129   BILIRUBINUR NEGATIVE 06/21/2020 1129   KETONESUR NEGATIVE 06/21/2020 1129   UROBILINOGEN 0.2 06/21/2020 1129   NITRITE NEGATIVE 06/21/2020 1129   LEUKOCYTESUR NEGATIVE 06/21/2020 1129   Sepsis Labs: Invalid input(s): PROCALCITONIN, LACTICIDVEN  Microbiology: No results found for this or any previous visit (from the past 240 hour(s)).  Radiology Studies: No results found.   Quindell Shere T. Tema Alire [facility]  If 7PM-7AM, please contact night-coverage [email or web address] 03/03/2021, 1:54 PM

## 2021-03-04 DIAGNOSIS — L03115 Cellulitis of right lower limb: Secondary | ICD-10-CM | POA: Diagnosis not present

## 2021-03-04 LAB — RENAL FUNCTION PANEL
Albumin: 2.9 g/dL — ABNORMAL LOW (ref 3.5–5.0)
Anion gap: 10 (ref 5–15)
BUN: 22 mg/dL (ref 8–23)
CO2: 28 mmol/L (ref 22–32)
Calcium: 9.3 mg/dL (ref 8.9–10.3)
Chloride: 97 mmol/L — ABNORMAL LOW (ref 98–111)
Creatinine, Ser: 1.56 mg/dL — ABNORMAL HIGH (ref 0.44–1.00)
GFR, Estimated: 37 mL/min — ABNORMAL LOW (ref >60)
Glucose, Bld: 119 mg/dL — ABNORMAL HIGH (ref 70–99)
Phosphorus: 4 mg/dL (ref 2.5–4.6)
Potassium: 3.7 mmol/L (ref 3.5–5.1)
Sodium: 135 mmol/L (ref 135–145)

## 2021-03-04 LAB — GLUCOSE, CAPILLARY
Glucose-Capillary: 107 mg/dL — ABNORMAL HIGH (ref 70–99)
Glucose-Capillary: 141 mg/dL — ABNORMAL HIGH (ref 70–99)
Glucose-Capillary: 132 mg/dL — ABNORMAL HIGH (ref 70–99)
Glucose-Capillary: 94 mg/dL (ref 70–99)

## 2021-03-04 LAB — MAGNESIUM: Magnesium: 2.3 mg/dL (ref 1.7–2.4)

## 2021-03-04 MED ORDER — FAMOTIDINE 20 MG PO TABS
20.0000 mg | ORAL_TABLET | Freq: Every day | ORAL | Status: DC
  Administered 2021-03-07 – 2021-03-19 (×13): 20 mg via ORAL
  Filled 2021-03-04 (×14): qty 1

## 2021-03-04 MED ORDER — LORATADINE 10 MG PO TABS
10.0000 mg | ORAL_TABLET | Freq: Two times a day (BID) | ORAL | Status: AC
Start: 2021-03-04 — End: 2021-03-06
  Administered 2021-03-04 – 2021-03-06 (×6): 10 mg via ORAL
  Filled 2021-03-04 (×6): qty 1

## 2021-03-04 MED ORDER — FAMOTIDINE 20 MG PO TABS
20.0000 mg | ORAL_TABLET | Freq: Two times a day (BID) | ORAL | Status: AC
  Administered 2021-03-04 – 2021-03-06 (×6): 20 mg via ORAL
  Filled 2021-03-04 (×5): qty 1

## 2021-03-04 MED ORDER — LORATADINE 10 MG PO TABS
10.0000 mg | ORAL_TABLET | Freq: Every day | ORAL | Status: DC
  Administered 2021-03-07 – 2021-03-19 (×13): 10 mg via ORAL
  Filled 2021-03-04 (×14): qty 1

## 2021-03-04 NOTE — Progress Notes (Signed)
[facility] PROGRESS NOTE  Pamela [name] ASN:[id number] DOB: [birth year] DOA: 02/08/2021 PCP: Corwin Levins, MD  Status: Remains inpatient appropriate because:Unsafe d/c plan patient is unable to independently stand for more than 1 minute and we are continuing aggressive PT and OT ups of her increasing standing duration so she can transition to an inpatient rehabilitation program at [facility]  Dispo: The patient is from: Home              Anticipated d/c is to: [facility]              Patient currently is medically stable to d/c.   Difficult to place patient Yes              Barriers to discharge: Has utilized all insurance days for SNF rehab or acute rehab.  Will be eligible for Medicare next month and once obtains initial Medicare number IR will accept patient   Level of care: Telemetry Cardiac  Code Status: DNR Family Communication: Patient only DVT prophylaxis: Eliquis COVID vaccination status: Pfizer 01/24/2020, 04/03/2020.  Eligible for booster  HPI: 65 year old with past medical history significant for permanent A. fib on Eliquis, chronic diastolic heart failure, CKD stage IIIa, diabetes type 2, hypertension, hyperlipidemia, depression, anxiety presented to the ED for evaluation of right leg swelling. Recently hospitalized 12/17/2020 until 12/21/2020 for lower GI bleed related to diverticulosis and stercoral ulcer.  She was discharged to SNF.  Patient was sent home from skilled nursing facility on 02/07/2021 as her insurance will not pay for further days, patient continued to have knee and hip problems with generalized debility.  Also she did not get a prescription for Eliquis and had not taken it for 3 days.  On 9/1 she noticed increasing swelling and erythema to her right lower leg and presented for evaluation.   Subjective: Today complaining of itching and mild rash secondary to seasonal allergies and history of eczema.  States she typically takes cetirizine with good  effect.  Objective: Vitals:   03/03/21 2035 03/04/21 0541  BP: 125/75 120/68  Pulse: 76 (!) 101  Resp: 18 19  Temp: 98.4 F (36.9 C) 98 F (36.7 C)  SpO2: 91% 93%    Intake/Output Summary (Last 24 hours) at 03/04/2021 0739 Last data filed at 03/04/2021 0644 Gross per 24 hour  Intake 1560 ml  Output 2350 ml  Net -790 ml   Filed Weights   03/02/21 0305 03/03/21 0503 03/04/21 0541  Weight: (!) 151.9 kg (!) 151 kg (!) 151.2 kg    Exam: Constitutional: NAD, calm Respiratory: Lung sounds are clear bilaterally, no increased work of breathing.  Normal pulse oximetry readings. Cardiovascular: Heart sounds S1-S2, minimal peripheral edema, no JVD, normotensive Abdomen: Soft and nontender with normoactive bowel sounds. LBM 9/25  Tolerating oral diet Neurologic: CN 2-12 grossly intact. Sensation intact, DTR normal. Strength 5/5 x all 4 extremities.  Psychiatric: Normal judgment and insight. Alert and oriented x 3. Normal mood.    Assessment/Plan: Acute problems: Cellulitis of the right lower extremity age indeterminate right leg DVT: Doppler on 02/09/2021, age indeterminate right popliteal vein thrombosis.   Has completed 10 days of antibiotics  Continue Eliquis.   Permanent A. fib:  Continue Eliquis Rate controlled on diltiazem and Toprol   General debility, chronic bilateral knee pain, right hip pain: Plan is to discharge to inpatient rehabilitation As of 9/22 OT recommending inpatient rehabilitation at time of discharge Patient would greatly benefit from intensive PT and OT in an  inpatient rehabilitation setting.  This would maximize her safety, independence and improve functional mobility.  Patient is very motivated to improve.  Based on therapy notes she is functioning significantly below her baseline   Mild renal insufficiency Peak creatinine 1.42 with a GFR of 41 on 9/5 and now back to baseline Baseline creatinine 0.89 through 1.24 with average GFR greater than 6 Stable  monitor  Chronic lower extremity edema Continue Lasix Renal function stable thus far with resumption of Jardiance   Hypokalemia:  Continues to have suboptimal potassium readings (< 4.0)  9/22: Increase from 40 mEq daily to 60 mEq daily   Diabetic neuropathy:  Continue with Lyrica   Type II Diabetes:  Patient was on Jardiance, Actos and glipizide prior to admission but was not on insulin. Continue preadmission medications as above except for Actos.  Patient with increasing edema over the weekend and attending physician discontinued this medication due to patient's history of diastolic heart failure.  Of note patient was also on metformin prior to admission and this medication was not listed on her med rec.  Subsequently this has been resumed. GFR has recovered-HgbA1c June 2022 was 6.9  Nutrition BMI elevated without any evidence of malnutrition Continue carbohydrate modified diet Body mass index is 45.21 kg/m.    Stage II right buttock decubitus Wound / Incision (Open or Dehisced) 02/09/21 Non-pressure wound Tibial Posterior;Proximal;Right Open blister secondary to cellulitis (Active)  Date First Assessed/Time First Assessed: 02/09/21 1321   Wound Type: Non-pressure wound  Location: Tibial  Location Orientation: Posterior;Proximal;Right  Wound Description (Comments): Open blister secondary to cellulitis  Present on Admission: Yes    Assessments 02/09/2021  1:00 PM 03/01/2021 10:36 PM  Dressing Type Foam - Lift dressing to assess site every shift None  Dressing Changed New Reinforced  Dressing Status Dry;Intact;Clean --  Dressing Change Frequency PRN Daily  Site / Wound Assessment -- Pink;Dry  Peri-wound Assessment -- Intact  Wound Length (cm) 4 cm 4 cm  Wound Width (cm) 5 cm 5 cm  Wound Depth (cm) 0 cm 0 cm  Wound Volume (cm^3) 0 cm^3 0 cm^3  Wound Surface Area (cm^2) 20 cm^2 20 cm^2  Drainage Amount Minimal None  Drainage Description Sanguineous;No odor Sanguineous  Treatment  -- Other (Comment)     No Linked orders to display     Wound / Incision (Open or Dehisced) 02/09/21 (ITD) Intertriginous Dermatitis Buttocks Circumferential red and raised rash (Active)  Date First Assessed/Time First Assessed: 02/09/21 1323   Wound Type: (ITD) Intertriginous Dermatitis  Location: (c) Buttocks  Location Orientation: Circumferential  Wound Description (Comments): red and raised rash  Present on Admission: Yes    Assessments 02/09/2021  1:00 PM 03/01/2021 10:36 PM  Dressing Type Foam - Lift dressing to assess site every shift None  Dressing Changed New Reinforced  Dressing Status Dry;Intact;Clean Clean;Dry;Intact  Dressing Change Frequency PRN PRN  Site / Wound Assessment -- Clean  Peri-wound Assessment -- Intact  Drainage Amount None None  Treatment Cleansed Cleansed     No Linked orders to display     Pressure Injury 02/09/21 Buttocks Right Stage 2 -  Partial thickness loss of dermis presenting as a shallow open injury with a red, pink wound bed without slough. (Active)  Date First Assessed/Time First Assessed: 02/09/21 2059   Location: Buttocks  Location Orientation: Right  Staging: Stage 2 -  Partial thickness loss of dermis presenting as a shallow open injury with a red, pink wound bed without slough.  Present  on A...    Assessments 02/09/2021  8:59 PM 03/03/2021  9:46 PM  Dressing Type Foam - Lift dressing to assess site every shift Foam - Lift dressing to assess site every shift  Dressing -- Clean;Dry;Intact  Dressing Change Frequency -- PRN  Drainage Amount -- None     No Linked orders to display      Data Reviewed: Basic Metabolic Panel: Recent Labs  Lab 02/26/21 0346 02/28/21 0418 03/02/21 0347 03/03/21 0251 03/04/21 0322  NA 136 135 135 137 135  K 3.2* 3.4* 3.3* 3.6 3.7  CL 95* 97* 97* 99 97*  CO2 31 30 29 29 28   GLUCOSE 138* 148* 131* 157* 119*  BUN 16 18 22 21 22   CREATININE 1.13* 1.44* 1.38* 1.32* 1.56*  CALCIUM 9.3 9.1 9.4 9.1 9.3  MG  --   --    --  2.2 2.3  PHOS  --   --   --  4.5 4.0    CBC: Recent Labs  Lab 03/03/21 0251  WBC 11.6*  HGB 11.2*  HCT 37.2  MCV 84.7  PLT 356    Cardiac Enzymes: Recent Labs  Lab 03/03/21 0251  CKTOTAL 15*   BNP (last 3 results) Recent Labs    02/08/21 2113 03/03/21 0251  BNP 210.6* 92.4    ProBNP (last 3 results) Recent Labs    03/20/20 1145 06/21/20 1129  PROBNP 125.0* 139.0*    CBG: Recent Labs  Lab 03/03/21 0629 03/03/21 1150 03/03/21 1658 03/03/21 2119 03/04/21 0607  GLUCAP 130* 123* 139* 191* 107*     Scheduled Meds:  apixaban  5 mg Oral BID   atorvastatin  40 mg Oral Daily   diltiazem  360 mg Oral Daily   empagliflozin  25 mg Oral Daily   ferrous sulfate  325 mg Oral Q24H   furosemide  40 mg Oral BID   Gerhardt's butt cream   Topical BID   glipiZIDE  10 mg Oral Q breakfast   insulin aspart  0-9 Units Subcutaneous TID WC   insulin aspart  3 Units Subcutaneous TID WC   metFORMIN  500 mg Oral BID WC   metoprolol succinate  100 mg Oral Daily   potassium chloride  40 mEq Oral Daily   pregabalin  50 mg Oral TID   sodium chloride flush  3 mL Intravenous Q12H    Principal Problem:   Cellulitis of right lower extremity Active Problems:   Type 2 diabetes mellitus (HCC)   Hypertension associated with diabetes (HCC)   CKD (chronic kidney disease) stage 3, GFR 30-59 ml/min (HCC)   Chronic atrial fibrillation (HCC)   Chronic diastolic heart failure (HCC)   Hyperlipidemia associated with type 2 diabetes mellitus (HCC)   Pressure injury of skin   Consultants: None  Procedures: None  Antibiotics: IV cefazolin 9/2 through 9/6 PO cephalexin 9/7 through 9/12 PO doxycycline 9/8 through 9/12  Time spent: 35 minutes    11/8 ANP  [facility] 7 am - 330 pm/M-F for direct patient care and secure chat Please refer to Amion for contact info 20  days

## 2021-03-04 NOTE — Progress Notes (Signed)
Physical Therapy Treatment Patient Details Name: Pamela [name] MRN: [id number] DOB: [birth year] Today's Date: 03/04/2021   History of Present Illness Pt is a 65 y.o. female admitted 02/08/21 with RLE swelling and erythema. Workup for RLE cellulitis, age-indeterminate RLE DVT. CXR with cardiomegaly, vascular congestion. PMH includes anxiety, depression, afib, CHF, DM2, HTN, rosacea, migraines, R knee DJD. Of note, admission 7/11-7/15/22 for GIB with discharge to SNF with recent return home 02/07/21.    PT Comments    Pt motivated to participate in therapy. Pt tolerated 10+ minutes EOB sitting without PT support, in preparation for stand attempts and transfers OOB. Pt attempted stand x4 with use of stedy, pt clearing buttocks ~4 inches only each time. Pt much more successful with scoot pivot to recliner,requiring only light steadying assist. Pt instructed in LE exercises to promote strengthening, tolerated well. Pt able to tolerate 3 hours/therapy a day in an intensive therapy program, pt is very motivated. PT to conitnue to follow.     Recommendations for follow up therapy are one component of a multi-disciplinary discharge planning process, led by the attending physician.  Recommendations may be updated based on patient status, additional functional criteria and insurance authorization.  Follow Up Recommendations  CIR;Supervision for mobility/OOB (IPR)     Equipment Recommendations  Other (comment) (tbd next venue)    Recommendations for Other Services       Precautions / Restrictions Precautions Precautions: Fall;Other (comment) Precaution Comments: Bladder/bowel incontinence (not today) Restrictions Weight Bearing Restrictions: No     Mobility  Bed Mobility Overal bed mobility: Needs Assistance Bed Mobility: Supine to Sit     Supine to sit: Supervision;HOB elevated     General bed mobility comments: supervision for safety, use of significant HOB elevation and bedrails.     Transfers Overall transfer level: Needs assistance Equipment used: Rolling walker (2 wheeled) Transfers: Sit to/from Stand;Lateral/Scoot Transfers Sit to Stand: Max assist;From elevated surface        Lateral/Scoot Transfers: Min assist General transfer comment: max assist for stand attempts in stedy, able to clear buttocks ~4 inches only on each attempt. Stand attempts x4. Scoot to drop arm recliner towards R requiring light steadying assist, cues for proper hand placement, PT holding recliner steady.  Ambulation/Gait             General Gait Details: unable   Stairs             Wheelchair Mobility    Modified Rankin (Stroke Patients Only)       Balance Overall balance assessment: Needs assistance Sitting-balance support: No upper extremity supported;Feet supported Sitting balance-[name]: Good Sitting balance - Comments: Supervision sitting EOB statically and dynamically.                                    Cognition Arousal/Alertness: Awake/alert Behavior During Therapy: WFL for tasks assessed/performed Overall Cognitive Status: Within Functional Limits for tasks assessed                                        Exercises General Exercises - Lower Extremity Quad Sets: AROM;Both;5 reps;Seated Long Arc Quad: AROM;Both;15 reps;Seated Hip ABduction/ADduction: AROM;Both;5 reps;Seated Hip Flexion/Marching: AROM;Both;Seated;15 reps    General Comments        Pertinent Vitals/Pain Pain Assessment: Faces Faces Pain Scale: Hurts little more Pain  Location: bilat knee R>L (arthritic, chronic) Pain Descriptors / Indicators: Sore;Discomfort Pain Intervention(s): Limited activity within patient's tolerance;Monitored during session;Repositioned    Home Living                      Prior Function            PT Goals (current goals can now be found in the care plan section) Acute Rehab PT Goals Patient Stated  Goal: to get up to chair and go to inpatient rehab PT Goal Formulation: With patient Time For Goal Achievement: 03/11/21 Potential to Achieve Goals: Fair Progress towards PT goals: Progressing toward goals    Frequency    Min 3X/week      PT Plan Current plan remains appropriate    Co-evaluation              AM-PAC PT "6 Clicks" Mobility   Outcome Measure  Help needed turning from your back to your side while in a flat bed without using bedrails?: A Little Help needed moving from lying on your back to sitting on the side of a flat bed without using bedrails?: A Little Help needed moving to and from a bed to a chair (including a wheelchair)?: A Little Help needed standing up from a chair using your arms (e.g., wheelchair or bedside chair)?: Total Help needed to walk in hospital room?: Total Help needed climbing 3-5 steps with a railing? : Total 6 Click Score: 12    End of Session Equipment Utilized During Treatment: Gait belt Activity Tolerance: Patient tolerated treatment well Patient left: in chair;with call bell/phone within reach;with chair alarm set;Other (comment) (lift pad placed) Nurse Communication: Mobility status;Need for lift equipment;Other (comment) (spoke with NT about possibility of needing lift back to bed) PT Visit Diagnosis: Unsteadiness on feet (R26.81);Muscle weakness (generalized) (M62.81);Difficulty in walking, not elsewhere classified (R26.2)     Time: 1133-1203 PT Time Calculation (min) (ACUTE ONLY): 30 min  Charges:  $Therapeutic Exercise: 8-22 mins $Therapeutic Activity: 8-22 mins                     Pamela [name], PT DPT Acute Rehabilitation Services Pager [phone]  Office [phone]    Pamela [name] 03/04/2021, 12:23 PM

## 2021-03-05 LAB — GLUCOSE, CAPILLARY
Glucose-Capillary: 112 mg/dL — ABNORMAL HIGH (ref 70–99)
Glucose-Capillary: 135 mg/dL — ABNORMAL HIGH (ref 70–99)
Glucose-Capillary: 157 mg/dL — ABNORMAL HIGH (ref 70–99)
Glucose-Capillary: 129 mg/dL — ABNORMAL HIGH (ref 70–99)

## 2021-03-05 NOTE — Progress Notes (Addendum)
11am: CSW spoke with Verdon Cummins at [facility] who states she will initiate insurance authorization.  8:45am: CSW spoke with Bjorn Loser in Acute Rehab office to request patient be seen by OT as soon as possible.  Edwin Dada, MSW, LCSW Transitions of Care  Clinical Social Worker II [phone]

## 2021-03-05 NOTE — Progress Notes (Signed)
Occupational Therapy Treatment Patient Details Name: Pamela [name] MRN: [id number] DOB: 1956/04/05 Today's Date: 03/05/2021   History of present illness Pt is a 65 y.o. female admitted 02/08/21 with RLE swelling and erythema. Workup for RLE cellulitis, age-indeterminate RLE DVT. CXR with cardiomegaly, vascular congestion. PMH includes anxiety, depression, afib, CHF, DM2, HTN, rosacea, migraines, R knee DJD. Of note, admission 7/11-7/15/22 for GIB with discharge to SNF with recent return home 02/07/21.   OT comments  Pt received after bath with NT, awake and alert agreeable to OT session. RN provided meds during session. Pt is slowly progressing towards established goals, however, still limited with weakness, decreased functional mobility, activity tolerance and safety awareness. During session, pt did not c/o of pain and was able to demonstrate supervision supine to sit EOB, set up grooming, sit to stand transition with stedy (4 attempts) max A with bed elevated, min A lateral scoots to drop arm recliner. Pt will benefit to continue skilled level OT at acute level and inpatient rehab to ensure increased rehab to maximize independence and to improve established deficits prior to returning home. DC recommendation and frequency remains the same.    Recommendations for follow up therapy are one component of a multi-disciplinary discharge planning process, led by the attending physician.  Recommendations may be updated based on patient status, additional functional criteria and insurance authorization.    Follow Up Recommendations   (Inpatient rehab)    Equipment Recommendations  Wheelchair (measurements OT);Wheelchair cushion (measurements OT);Tub/shower bench (drop arm commode(bariatric))    Recommendations for Other Services      Precautions / Restrictions Precautions Precautions: Fall;Other (comment) Precaution Comments: Bladder/bowel incontinence (not today)       Mobility Bed  Mobility Overal bed mobility: Needs Assistance Bed Mobility: Supine to Sit Rolling: Supervision   Supine to sit: Supervision;HOB elevated     General bed mobility comments: SBA for safety, HOB elevation and pt demonstration good use of UE for trunk elevation with use of bed rails.    Transfers Overall transfer level: Needs assistance   Transfers: Sit to/from Stand;Lateral/Scoot Transfers Sit to Stand: Max assist;From elevated surface        Lateral/Scoot Transfers: Min assist General transfer comment: 4 attempts for sit to stand transition with use of stedy,  pt educated on pressing through extremity, however, reports limitation due to peripheral neuropathy. Pt informed that feet are positioned flat on platform of stedy. Max A for stand attempts in stedy, able to clear slightly above bed surface for each attempt. Pt then engaged in lateral scoot to drop arm recliner with Min A cueing for safety with sequencing to ensure safe transition.    Balance Overall balance assessment: Needs assistance Sitting-balance support: No upper extremity supported;Feet supported Sitting balance-[name]: Good Sitting balance - Comments: Mod I sitting EOB statically and dynamically.   Standing balance support: Bilateral upper extremity supported Standing balance-[name]: Poor Standing balance comment: Able to maintain static standing in stedy frame with BUE support and minA-modA for ~10-30 sec before fatiguing, decreased time in stand as reps progressed (x4 reps total).                           ADL either performed or assessed with clinical judgement   ADL Overall ADL's : Needs assistance/impaired     Grooming: Wash/dry face;Oral care;Sitting;Set up Grooming Details (indicate cue type and reason): Pt presented EOB with set up for oral care.  Lower Body Dressing: Total assistance;Sitting/lateral leans Lower Body Dressing Details (indicate cue type and reason):  assist to don socks at bed level prior to supine to sit transition.               General ADL Comments: Pt sat EOB mod I for sitting balance and set up for grooming task.     Vision   Vision Assessment?: No apparent visual deficits   Perception     Praxis      Cognition Arousal/Alertness: Awake/alert Behavior During Therapy: WFL for tasks assessed/performed Overall Cognitive Status: Within Functional Limits for tasks assessed                                 General Comments: pleasant and agreeable to participate in session.        Exercises     Shoulder Instructions       General Comments      Pertinent Vitals/ Pain       Pain Assessment: No/denies pain Faces Pain Scale: Hurts little more Pain Location: bilat knee R>L (arthritic, chronic) Pain Descriptors / Indicators: Sore;Discomfort Pain Intervention(s): RN gave pain meds during session  Home Living                                          Prior Functioning/Environment              Frequency  Min 2X/week        Progress Toward Goals  OT Goals(current goals can now be found in the care plan section)  Progress towards OT goals: Progressing toward goals  Acute Rehab OT Goals Patient Stated Goal: to get up to chair and go to inpatient rehab OT Goal Formulation: With patient Time For Goal Achievement: 03/11/21 Potential to Achieve Goals: Good ADL Goals Pt Will Perform Upper Body Bathing: with set-up;sitting;with adaptive equipment Pt Will Perform Lower Body Bathing: with mod assist;sitting/lateral leans;bed level;with adaptive equipment Pt Will Perform Upper Body Dressing: with set-up;sitting Pt Will Perform Lower Body Dressing: with mod assist;with adaptive equipment;sitting/lateral leans Pt Will Transfer to Toilet: with mod assist;with +2 assist;with transfer board;bedside commode Pt/caregiver will Perform Home Exercise Program: Increased strength;Right Upper  extremity;Left upper extremity;With theraband;Independently;With written HEP provided  Plan Discharge plan remains appropriate    Co-evaluation                 AM-PAC OT "6 Clicks" Daily Activity     Outcome Measure   Help from another person eating meals?: None Help from another person taking care of personal grooming?: A Little Help from another person toileting, which includes using toliet, bedpan, or urinal?: A Lot Help from another person bathing (including washing, rinsing, drying)?: A Lot Help from another person to put on and taking off regular upper body clothing?: A Little Help from another person to put on and taking off regular lower body clothing?: Total 6 Click Score: 15    End of Session Equipment Utilized During Treatment: Gait belt  OT Visit Diagnosis: Muscle weakness (generalized) (M62.81)   Activity Tolerance Patient tolerated treatment well   Patient Left in chair;with call bell/phone within reach   Nurse Communication Mobility status        Time: 9407-6808 OT Time Calculation (min): 31 min  Charges: OT General Charges $OT Visit: 1 Visit  OT Treatments $Self Care/Home Management : 23-37 mins  Goals   None      Zigmund Daniel 03/05/2021, 10:37 AM

## 2021-03-05 NOTE — Progress Notes (Signed)
[facility] PROGRESS NOTE  Pamela [name] SUG:[id number] DOB: 1956-03-01 DOA: 02/08/2021 PCP: Corwin Levins, MD  Status: Remains inpatient appropriate because:Unsafe d/c plan patient is unable to independently stand for more than 1 minute and we are continuing aggressive PT and OT ups of her increasing standing duration so she can transition to an inpatient rehabilitation program at [facility]  Dispo: The patient is from: Home              Anticipated d/c is to: [facility]              Patient currently is medically stable to d/c.   Difficult to place patient Yes              Barriers to discharge: Has utilized all insurance days for SNF rehab or acute rehab.  Will be eligible for Medicare next month and once obtains initial Medicare number IR will accept patient   Level of care: Telemetry Cardiac  Code Status: DNR Family Communication: Patient only DVT prophylaxis: Eliquis COVID vaccination status: Pfizer 01/24/2020, 04/03/2020.  Eligible for booster  HPI: 65 year old with past medical history significant for permanent A. fib on Eliquis, chronic diastolic heart failure, CKD stage IIIa, diabetes type 2, hypertension, hyperlipidemia, depression, anxiety presented to the ED for evaluation of right leg swelling. Recently hospitalized 12/17/2020 until 12/21/2020 for lower GI bleed related to diverticulosis and stercoral ulcer.  She was discharged to SNF.  Patient was sent home from skilled nursing facility on 02/07/2021 as her insurance will not pay for further days, patient continued to have knee and hip problems with generalized debility.  Also she did not get a prescription for Eliquis and had not taken it for 3 days.  On 9/1 she noticed increasing swelling and erythema to her right lower leg and presented for evaluation.   Subjective: Alert.  Slight decrease in itching but states had only received 1 dose of the Claritin and Pepcid.  Eager to discharge and is hopeful inpatient rehab will be  authorized soon.  Objective: Vitals:   03/04/21 2105 03/05/21 0357  BP: 122/83 117/74  Pulse: 90 95  Resp: 18 20  Temp: 98.4 F (36.9 C) 98.1 F (36.7 C)  SpO2: 95% 94%    Intake/Output Summary (Last 24 hours) at 03/05/2021 0748 Last data filed at 03/05/2021 0100 Gross per 24 hour  Intake 957 ml  Output 2350 ml  Net -1393 ml   Filed Weights   03/03/21 0503 03/04/21 0541 03/05/21 0357  Weight: (!) 151 kg (!) 151.2 kg (!) 150.5 kg    Exam: Constitutional: NAD, calm ENT: Bilateral injected sclera with clear drainage Respiratory: Lung sounds clear, stable on room air.  Normal pulse oximetry Cardiovascular: S1-S2, minimal peripheral edema, no JVD, remain normotensive Abdomen: Soft and nontender with normoactive bowel sounds. LBM 9/26  Tolerating oral diet Neurologic: CN 2-12 grossly intact. Sensation intact, DTR normal. Strength 5/5 x all 4 extremities.  Psychiatric: Normal judgment and insight. Alert and oriented x 3.  Does not mood.    Assessment/Plan: Acute problems: Cellulitis of the right lower extremity age indeterminate right leg DVT: Doppler on 02/09/2021, age indeterminate right popliteal vein thrombosis.   Has completed 10 days of antibiotics  Continue Eliquis.   Permanent A. fib:  Continue Eliquis Rate controlled on diltiazem and Toprol  Recurrent seasonal allergies Patient typically takes cetirizine regularly but has not been prescribed this during the hospitalization She is now developing typical respiratory and skin symptoms (mild eczematous type  rash) and is requesting resumption of her cetirizine Begin cetirizine with Pepcid BID X 3 days then decrease both drugs to daily   General debility, chronic bilateral knee pain, right hip pain: Plan is to discharge to inpatient rehabilitation As of 9/22 OT recommending inpatient rehabilitation at time of discharge Patient would greatly benefit from intensive PT and OT in an inpatient rehabilitation setting.  This  would maximize her safety, independence and improve functional mobility.  Patient is very motivated to improve.  Based on therapy notes she is functioning significantly below her baseline   Mild renal insufficiency Peak creatinine 1.42 with a GFR of 41 on 9/5 and now back to baseline Baseline creatinine 0.89 through 1.24 with average GFR greater than 6 Stable monitor  Chronic lower extremity edema Continue Lasix Renal function stable thus far with resumption of Jardiance   Hypokalemia:  Continues to have suboptimal potassium readings (< 4.0)  9/22: Increase from 40 mEq daily to 60 mEq daily   Diabetic neuropathy:  Continue with Lyrica   Type II Diabetes:  Patient was on Jardiance, Actos and glipizide prior to admission but was not on insulin. Actos discontinued secondary to edema and history of heart failure. Metformin resumed GFR has recovered-HgbA1c June 2022 was 6.9  Nutrition BMI elevated without any evidence of malnutrition Continue carbohydrate modified diet Body mass index is 45 kg/m.    Stage II right buttock decubitus Wound / Incision (Open or Dehisced) 02/09/21 Non-pressure wound Tibial Posterior;Proximal;Right Open blister secondary to cellulitis (Active)  Date First Assessed/Time First Assessed: 02/09/21 1321   Wound Type: Non-pressure wound  Location: Tibial  Location Orientation: Posterior;Proximal;Right  Wound Description (Comments): Open blister secondary to cellulitis  Present on Admission: Yes    Assessments 02/09/2021  1:00 PM 03/01/2021 10:36 PM  Dressing Type Foam - Lift dressing to assess site every shift None  Dressing Changed New Reinforced  Dressing Status Dry;Intact;Clean --  Dressing Change Frequency PRN Daily  Site / Wound Assessment -- Pink;Dry  Peri-wound Assessment -- Intact  Wound Length (cm) 4 cm 4 cm  Wound Width (cm) 5 cm 5 cm  Wound Depth (cm) 0 cm 0 cm  Wound Volume (cm^3) 0 cm^3 0 cm^3  Wound Surface Area (cm^2) 20 cm^2 20 cm^2   Drainage Amount Minimal None  Drainage Description Sanguineous;No odor Sanguineous  Treatment -- Other (Comment)     No Linked orders to display     Wound / Incision (Open or Dehisced) 02/09/21 (ITD) Intertriginous Dermatitis Buttocks Circumferential red and raised rash (Active)  Date First Assessed/Time First Assessed: 02/09/21 1323   Wound Type: (ITD) Intertriginous Dermatitis  Location: (c) Buttocks  Location Orientation: Circumferential  Wound Description (Comments): red and raised rash  Present on Admission: Yes    Assessments 02/09/2021  1:00 PM 03/01/2021 10:36 PM  Dressing Type Foam - Lift dressing to assess site every shift None  Dressing Changed New Reinforced  Dressing Status Dry;Intact;Clean Clean;Dry;Intact  Dressing Change Frequency PRN PRN  Site / Wound Assessment -- Clean  Peri-wound Assessment -- Intact  Drainage Amount None None  Treatment Cleansed Cleansed     No Linked orders to display     Pressure Injury 02/09/21 Buttocks Right Stage 2 -  Partial thickness loss of dermis presenting as a shallow open injury with a red, pink wound bed without slough. (Active)  Date First Assessed/Time First Assessed: 02/09/21 2059   Location: Buttocks  Location Orientation: Right  Staging: Stage 2 -  Partial thickness loss of  dermis presenting as a shallow open injury with a red, pink wound bed without slough.  Present on A...    Assessments 02/09/2021  8:59 PM 03/03/2021  9:46 PM  Dressing Type Foam - Lift dressing to assess site every shift Foam - Lift dressing to assess site every shift  Dressing -- Clean;Dry;Intact  Dressing Change Frequency -- PRN  Drainage Amount -- None     No Linked orders to display      Data Reviewed: Basic Metabolic Panel: Recent Labs  Lab 02/28/21 0418 03/02/21 0347 03/03/21 0251 03/04/21 0322  NA 135 135 137 135  K 3.4* 3.3* 3.6 3.7  CL 97* 97* 99 97*  CO2 30 29 29 28   GLUCOSE 148* 131* 157* 119*  BUN 18 22 21 22   CREATININE 1.44* 1.38*  1.32* 1.56*  CALCIUM 9.1 9.4 9.1 9.3  MG  --   --  2.2 2.3  PHOS  --   --  4.5 4.0    CBC: Recent Labs  Lab 03/03/21 0251  WBC 11.6*  HGB 11.2*  HCT 37.2  MCV 84.7  PLT 356    Cardiac Enzymes: Recent Labs  Lab 03/03/21 0251  CKTOTAL 15*   BNP (last 3 results) Recent Labs    02/08/21 2113 03/03/21 0251  BNP 210.6* 92.4    ProBNP (last 3 results) Recent Labs    03/20/20 1145 06/21/20 1129  PROBNP 125.0* 139.0*    CBG: Recent Labs  Lab 03/04/21 0607 03/04/21 1141 03/04/21 1555 03/04/21 2124 03/05/21 0627  GLUCAP 107* 141* 132* 94 112*     Scheduled Meds:  apixaban  5 mg Oral BID   atorvastatin  40 mg Oral Daily   diltiazem  360 mg Oral Daily   empagliflozin  25 mg Oral Daily   famotidine  20 mg Oral BID   Followed by   2125 ON 03/07/2021] famotidine  20 mg Oral Daily   ferrous sulfate  325 mg Oral Q24H   furosemide  40 mg Oral BID   Gerhardt's butt cream   Topical BID   glipiZIDE  10 mg Oral Q breakfast   insulin aspart  0-9 Units Subcutaneous TID WC   insulin aspart  3 Units Subcutaneous TID WC   loratadine  10 mg Oral BID   Followed by   Melene Muller ON 03/07/2021] loratadine  10 mg Oral Daily   metFORMIN  500 mg Oral BID WC   metoprolol succinate  100 mg Oral Daily   potassium chloride  40 mEq Oral Daily   pregabalin  50 mg Oral TID   sodium chloride flush  3 mL Intravenous Q12H    Principal Problem:   Cellulitis of right lower extremity Active Problems:   Type 2 diabetes mellitus (HCC)   Hypertension associated with diabetes (HCC)   CKD (chronic kidney disease) stage 3, GFR 30-59 ml/min (HCC)   Chronic atrial fibrillation (HCC)   Chronic diastolic heart failure (HCC)   Hyperlipidemia associated with type 2 diabetes mellitus (HCC)   Pressure injury of skin   Consultants: None  Procedures: None  Antibiotics: IV cefazolin 9/2 through 9/6 PO cephalexin 9/7 through 9/12 PO doxycycline 9/8 through 9/12  Time spent: 15  minutes    11/8 ANP  [facility] 7 am - 330 pm/M-F for direct patient care and secure chat Please refer to Amion for contact info 21  days

## 2021-03-06 DIAGNOSIS — L03115 Cellulitis of right lower limb: Secondary | ICD-10-CM | POA: Diagnosis not present

## 2021-03-06 LAB — GLUCOSE, CAPILLARY
Glucose-Capillary: 89 mg/dL (ref 70–99)
Glucose-Capillary: 114 mg/dL — ABNORMAL HIGH (ref 70–99)
Glucose-Capillary: 83 mg/dL (ref 70–99)
Glucose-Capillary: 122 mg/dL — ABNORMAL HIGH (ref 70–99)

## 2021-03-06 NOTE — Progress Notes (Signed)
[facility] PROGRESS NOTE  Pamela [name] RCV:[id number] DOB: 11-Dec-1955 DOA: 02/08/2021 PCP: Corwin Levins, MD  Status: Remains inpatient appropriate because:Unsafe d/c plan patient is unable to independently stand for more than 1 minute and we are continuing aggressive PT and OT ups of her increasing standing duration so she can transition to an inpatient rehabilitation program at [facility]  Dispo: The patient is from: Home              Anticipated d/c is to: [facility]              Patient currently is medically stable to d/c.   Difficult to place patient Yes              Barriers to discharge: Has utilized all insurance days for SNF rehab or acute rehab.  Will be eligible for Medicare next month and once obtains initial Medicare number IR will accept patient   Level of care: Telemetry Cardiac  Code Status: DNR Family Communication: Patient only DVT prophylaxis: Eliquis COVID vaccination status: Pfizer 01/24/2020, 04/03/2020.  Eligible for booster  HPI: 65 year old with past medical history significant for permanent A. fib on Eliquis, chronic diastolic heart failure, CKD stage IIIa, diabetes type 2, hypertension, hyperlipidemia, depression, anxiety presented to the ED for evaluation of right leg swelling. Recently hospitalized 12/17/2020 until 12/21/2020 for lower GI bleed related to diverticulosis and stercoral ulcer.  She was discharged to SNF.  Patient was sent home from skilled nursing facility on 02/07/2021 as her insurance will not pay for further days, patient continued to have knee and hip problems with generalized debility.  Also she did not get a prescription for Eliquis and had not taken it for 3 days.  On 9/1 she noticed increasing swelling and erythema to her right lower leg and presented for evaluation.   Subjective: Alert.  States she feels like her allergies are improving  Objective: Vitals:   03/05/21 2042 03/06/21 0432  BP: 117/70 120/73  Pulse: 86 84  Resp: 18  18  Temp: 97.9 F (36.6 C) (!) 97.5 F (36.4 C)  SpO2: 94% 91%    Intake/Output Summary (Last 24 hours) at 03/06/2021 0731 Last data filed at 03/06/2021 0515 Gross per 24 hour  Intake 1434 ml  Output 3325 ml  Net -1891 ml   Filed Weights   03/04/21 0541 03/05/21 0357 03/06/21 0432  Weight: (!) 151.2 kg (!) 150.5 kg (!) 151.6 kg    Exam: Constitutional: NAD, calm ENT: Sclera no longer injected bilaterally Respiratory: Lung sounds clear, stable on room air.  Normal pulse oximetry Cardiovascular: S1-S2, minimal peripheral edema, no JVD, remain normotensive Abdomen: Soft and nontender with normoactive bowel sounds. LBM 9/26  Tolerating oral diet Neurologic: CN 2-12 grossly intact. Sensation intact, DTR normal. Strength 5/5 x all 4 extremities.  Psychiatric: Normal judgment and insight. Alert and oriented x 3.     Assessment/Plan: Acute problems: Cellulitis of the right lower extremity age indeterminate right leg DVT: Doppler on 02/09/2021, age indeterminate right popliteal vein thrombosis.   Has completed 10 days of antibiotics  Continue Eliquis.   Permanent A. fib:  Eliquis, diltiazem and Toprol-rate controlled  Recurrent seasonal allergies Symptoms have improved with combination BID Zyrtec with Pepcid x 3 days Continue the same medications daily   General debility, chronic bilateral knee pain, right hip pain: Awaiting insurance authorization for [facility] inpatient rehabilitation  Mild renal insufficiency Peak creatinine 1.42 with a GFR of 41 on 9/5 and now back to  baseline Baseline creatinine 0.89 through 1.24 with average GFR greater than 6 Stable monitor  Chronic lower extremity edema Continue Lasix Renal function stable thus far with resumption of Jardiance   Hypokalemia:  Continues to have suboptimal potassium readings (< 4.0) -repeat labs in a.m. Continue potassium 60 mEq daily   Diabetic neuropathy:  Continue with Lyrica   Type II Diabetes:  Patient was on  Jardiance, Actos and glipizide prior to admission but was not on insulin. Actos discontinued secondary to edema and history of heart failure. Metformin resumed GFR has recovered-HgbA1c June 2022 was 6.9  Nutrition BMI elevated without any evidence of malnutrition Continue carbohydrate modified diet Body mass index is 45.33 kg/m.    Stage II right buttock decubitus Wound / Incision (Open or Dehisced) 02/09/21 Non-pressure wound Tibial Posterior;Proximal;Right Open blister secondary to cellulitis (Active)  Date First Assessed/Time First Assessed: 02/09/21 1321   Wound Type: Non-pressure wound  Location: Tibial  Location Orientation: Posterior;Proximal;Right  Wound Description (Comments): Open blister secondary to cellulitis  Present on Admission: Yes    Assessments 02/09/2021  1:00 PM 03/05/2021 10:03 AM  Dressing Type Foam - Lift dressing to assess site every shift None  Dressing Changed New Reinforced  Dressing Status Dry;Intact;Clean None  Dressing Change Frequency PRN Daily  Site / Wound Assessment -- Pink;Dry  Peri-wound Assessment -- Intact  Wound Length (cm) 4 cm --  Wound Width (cm) 5 cm --  Wound Depth (cm) 0 cm --  Wound Volume (cm^3) 0 cm^3 --  Wound Surface Area (cm^2) 20 cm^2 --  Drainage Amount Minimal None  Drainage Description Sanguineous;No odor Serosanguineous     No Linked orders to display     Wound / Incision (Open or Dehisced) 02/09/21 (ITD) Intertriginous Dermatitis Buttocks Circumferential red and raised rash (Active)  Date First Assessed/Time First Assessed: 02/09/21 1323   Wound Type: (ITD) Intertriginous Dermatitis  Location: (c) Buttocks  Location Orientation: Circumferential  Wound Description (Comments): red and raised rash  Present on Admission: Yes    Assessments 02/09/2021  1:00 PM 03/05/2021 10:03 AM  Dressing Type Foam - Lift dressing to assess site every shift None  Dressing Changed New --  Dressing Status Dry;Intact;Clean Clean;Dry;Intact  Dressing  Change Frequency PRN PRN  Site / Wound Assessment -- Clean  Peri-wound Assessment -- Intact  Drainage Amount None None  Treatment Cleansed --     No Linked orders to display     Pressure Injury 02/09/21 Buttocks Right Stage 2 -  Partial thickness loss of dermis presenting as a shallow open injury with a red, pink wound bed without slough. (Active)  Date First Assessed/Time First Assessed: 02/09/21 2059   Location: Buttocks  Location Orientation: Right  Staging: Stage 2 -  Partial thickness loss of dermis presenting as a shallow open injury with a red, pink wound bed without slough.  Present on A...    Assessments 02/09/2021  8:59 PM 03/05/2021 10:03 AM  Dressing Type Foam - Lift dressing to assess site every shift Foam - Lift dressing to assess site every shift  Dressing -- Clean;Intact;Dry  Dressing Change Frequency -- PRN  Site / Wound Assessment -- Dressing in place / Unable to assess  Peri-wound Assessment -- Intact  Drainage Amount -- None     No Linked orders to display      Data Reviewed: Basic Metabolic Panel: Recent Labs  Lab 02/28/21 0418 03/02/21 0347 03/03/21 0251 03/04/21 0322  NA 135 135 137 135  K 3.4* 3.3* 3.6  3.7  CL 97* 97* 99 97*  CO2 30 29 29 28   GLUCOSE 148* 131* 157* 119*  BUN 18 22 21 22   CREATININE 1.44* 1.38* 1.32* 1.56*  CALCIUM 9.1 9.4 9.1 9.3  MG  --   --  2.2 2.3  PHOS  --   --  4.5 4.0    CBC: Recent Labs  Lab 03/03/21 0251  WBC 11.6*  HGB 11.2*  HCT 37.2  MCV 84.7  PLT 356    Cardiac Enzymes: Recent Labs  Lab 03/03/21 0251  CKTOTAL 15*   BNP (last 3 results) Recent Labs    02/08/21 2113 03/03/21 0251  BNP 210.6* 92.4    ProBNP (last 3 results) Recent Labs    03/20/20 1145 06/21/20 1129  PROBNP 125.0* 139.0*    CBG: Recent Labs  Lab 03/04/21 2124 03/05/21 0627 03/05/21 1055 03/05/21 1629 03/05/21 2126  GLUCAP 94 112* 135* 157* 129*     Scheduled Meds:  apixaban  5 mg Oral BID   atorvastatin  40 mg  Oral Daily   diltiazem  360 mg Oral Daily   empagliflozin  25 mg Oral Daily   famotidine  20 mg Oral BID   Followed by   03/07/21 ON 03/07/2021] famotidine  20 mg Oral Daily   ferrous sulfate  325 mg Oral Q24H   furosemide  40 mg Oral BID   Gerhardt's butt cream   Topical BID   glipiZIDE  10 mg Oral Q breakfast   insulin aspart  0-9 Units Subcutaneous TID WC   insulin aspart  3 Units Subcutaneous TID WC   loratadine  10 mg Oral BID   Followed by   Melene Muller ON 03/07/2021] loratadine  10 mg Oral Daily   metFORMIN  500 mg Oral BID WC   metoprolol succinate  100 mg Oral Daily   potassium chloride  40 mEq Oral Daily   pregabalin  50 mg Oral TID   sodium chloride flush  3 mL Intravenous Q12H    Principal Problem:   Cellulitis of right lower extremity Active Problems:   Type 2 diabetes mellitus (HCC)   Hypertension associated with diabetes (HCC)   CKD (chronic kidney disease) stage 3, GFR 30-59 ml/min (HCC)   Chronic atrial fibrillation (HCC)   Chronic diastolic heart failure (HCC)   Hyperlipidemia associated with type 2 diabetes mellitus (HCC)   Pressure injury of skin   Consultants: None  Procedures: None  Antibiotics: IV cefazolin 9/2 through 9/6 PO cephalexin 9/7 through 9/12 PO doxycycline 9/8 through 9/12  Time spent: 15 minutes    11/8 ANP  [facility] 7 am - 330 pm/M-F for direct patient care and secure chat Please refer to Amion for contact info 22  days

## 2021-03-06 NOTE — Progress Notes (Signed)
Physical Therapy Treatment Patient Details Name: Pamela [name] MRN: [id number] DOB: June 12, 1955 Today's Date: 03/06/2021   History of Present Illness Pt is a 65 y.o. female admitted 02/08/21 with RLE swelling and erythema. Workup for RLE cellulitis, age-indeterminate RLE DVT. CXR with cardiomegaly, vascular congestion. PMH includes anxiety, depression, afib, CHF, DM2, HTN, rosacea, migraines, R knee DJD. Of note, admission 7/11-7/15/22 for GIB with discharge to SNF with recent return home 02/07/21.    PT Comments    Focused session on increasing reps at initiation of transfers through performing 3 x 3 reps of sit <> stand transfers, clearing buttocks a couple inches each rep, from elevated EOB and pulling up on posterior aspect of recliner. Also, completed session with lower extremity exercises and provided HEP handout with instructions to perform several exercises each meal to progress lower extremity strength and thus facilitate increased ease with transfers/standing. Will continue to follow acutely. Current recommendations remain appropriate. Pt is very motivated to participate and improve.   Recommendations for follow up therapy are one component of a multi-disciplinary discharge planning process, led by the attending physician.  Recommendations may be updated based on patient status, additional functional criteria and insurance authorization.  Follow Up Recommendations  CIR;Supervision for mobility/OOB (IPR)     Equipment Recommendations  Other (comment) (tbd next venue)    Recommendations for Other Services       Precautions / Restrictions Precautions Precautions: Fall;Other (comment) Precaution Comments: Bladder/bowel incontinence Restrictions Weight Bearing Restrictions: No     Mobility  Bed Mobility Overal bed mobility: Needs Assistance Bed Mobility: Supine to Sit;Rolling;Sit to Supine Rolling: Supervision   Supine to sit: Supervision;HOB elevated Sit to supine:  Supervision   General bed mobility comments: Supervision for safety with pt using bed rails and controls for all bed mobility aspects.    Transfers Overall transfer level: Needs assistance Equipment used:  (posterior aspect of recliner) Transfers: Sit to/from Stand;Lateral/Scoot Transfers Sit to Stand: From elevated surface;Mod assist        Lateral/Scoot Transfers: Min assist General transfer comment: Practiced clearing buttocks 3 sets x 3 reps at various bed heights to progress transfers sit <> stand through pt pulling up on posterior aspect of recliner, modA to clear buttocks but unable to come to full stand each rep. Pt with difficulty clearing buttocks enough to scoot laterally towards L on EOB, minA.  Ambulation/Gait             General Gait Details: unable   Stairs             Wheelchair Mobility    Modified Rankin (Stroke Patients Only)       Balance Overall balance assessment: Needs assistance Sitting-balance support: No upper extremity supported;Feet supported Sitting balance-[name]: Good     Standing balance support: Bilateral upper extremity supported Standing balance-[name]: Poor Standing balance comment: ModA and bil UE support to clear buttocks off EOB but not come to full stand, focusing on just clearing buttocks today.                            Cognition Arousal/Alertness: Awake/alert Behavior During Therapy: WFL for tasks assessed/performed Overall Cognitive Status: Within Functional Limits for tasks assessed                                 General Comments: pleasant and agreeable to participate in session. Very  motivated.      Exercises General Exercises - Lower Extremity Long Arc Quad: Strengthening;Both;10 reps;Seated (with isometric and eccentric control) Hip ABduction/ADduction: Strengthening;Both;15 reps;Sidelying (clamshells) Hip Flexion/Marching: Strengthening;Both;15 reps;Seated Other  Exercises Other Exercises: Bridges supine in bed x15 reps, clearing buttocks minimally    General Comments General comments (skin integrity, edema, etc.): MedBridge HEP Access Code: [email or web address]      Pertinent Vitals/Pain Pain Assessment: Faces Faces Pain Scale: Hurts little more Pain Location: bilat knee R>L (arthritic, chronic) Pain Descriptors / Indicators: Sore;Discomfort Pain Intervention(s): Limited activity within patient's tolerance;Monitored during session;Repositioned    Home Living                      Prior Function            PT Goals (current goals can now be found in the care plan section) Acute Rehab PT Goals Patient Stated Goal: to stand better and go to inpt rehab PT Goal Formulation: With patient Time For Goal Achievement: 03/11/21 Potential to Achieve Goals: Fair Progress towards PT goals: Progressing toward goals    Frequency    Min 3X/week      PT Plan Current plan remains appropriate    Co-evaluation              AM-PAC PT "6 Clicks" Mobility   Outcome Measure  Help needed turning from your back to your side while in a flat bed without using bedrails?: A Little Help needed moving from lying on your back to sitting on the side of a flat bed without using bedrails?: A Little Help needed moving to and from a bed to a chair (including a wheelchair)?: A Lot Help needed standing up from a chair using your arms (e.g., wheelchair or bedside chair)?: Total Help needed to walk in hospital room?: Total Help needed climbing 3-5 steps with a railing? : Total 6 Click Score: 11    End of Session Equipment Utilized During Treatment: Gait belt Activity Tolerance: Patient tolerated treatment well Patient left: in bed;with call bell/phone within reach;with bed alarm set   PT Visit Diagnosis: Unsteadiness on feet (R26.81);Muscle weakness (generalized) (M62.81);Difficulty in walking, not elsewhere classified (R26.2) Pain - Right/Left: Right Pain  - part of body: Leg     Time: 8168-3870 PT Time Calculation (min) (ACUTE ONLY): 41 min  Charges:  $Therapeutic Exercise: 23-37 mins $Therapeutic Activity: 8-22 mins                     Raymond Gurney, PT, DPT Acute Rehabilitation Services  Pager: [phone] Office: [phone]    Jewel Baize 03/06/2021, 5:14 PM

## 2021-03-07 DIAGNOSIS — L03115 Cellulitis of right lower limb: Secondary | ICD-10-CM | POA: Diagnosis not present

## 2021-03-07 LAB — BASIC METABOLIC PANEL
Anion gap: 10 (ref 5–15)
BUN: 21 mg/dL (ref 8–23)
CO2: 28 mmol/L (ref 22–32)
Calcium: 9.2 mg/dL (ref 8.9–10.3)
Chloride: 100 mmol/L (ref 98–111)
Creatinine, Ser: 1.51 mg/dL — ABNORMAL HIGH (ref 0.44–1.00)
GFR, Estimated: 38 mL/min — ABNORMAL LOW (ref >60)
Glucose, Bld: 79 mg/dL (ref 70–99)
Potassium: 3 mmol/L — ABNORMAL LOW (ref 3.5–5.1)
Sodium: 138 mmol/L (ref 135–145)

## 2021-03-07 LAB — GLUCOSE, CAPILLARY
Glucose-Capillary: 82 mg/dL (ref 70–99)
Glucose-Capillary: 88 mg/dL (ref 70–99)
Glucose-Capillary: 131 mg/dL — ABNORMAL HIGH (ref 70–99)
Glucose-Capillary: 112 mg/dL — ABNORMAL HIGH (ref 70–99)

## 2021-03-07 MED ORDER — ATORVASTATIN CALCIUM 40 MG PO TABS: 40.0000 mg | ORAL_TABLET | Freq: Every day | ORAL | Status: AC

## 2021-03-07 MED ORDER — GLIPIZIDE ER 10 MG PO TB24: 10.0000 mg | ORAL_TABLET | Freq: Every day | ORAL | Status: DC

## 2021-03-07 MED ORDER — POTASSIUM CHLORIDE CRYS ER 20 MEQ PO TBCR
40.0000 meq | EXTENDED_RELEASE_TABLET | Freq: Two times a day (BID) | ORAL | Status: DC
  Administered 2021-03-07 – 2021-03-19 (×25): 40 meq via ORAL
  Filled 2021-03-07 (×25): qty 2

## 2021-03-07 MED ORDER — DILTIAZEM HCL ER COATED BEADS 360 MG PO CP24: 360.0000 mg | ORAL_CAPSULE | Freq: Every day | ORAL | Status: AC

## 2021-03-07 MED ORDER — METFORMIN HCL 500 MG PO TABS: 500.0000 mg | ORAL_TABLET | Freq: Two times a day (BID) | ORAL | Status: AC

## 2021-03-07 MED ORDER — INSULIN ASPART 100 UNIT/ML IJ SOLN: 3.0000 [IU] | Freq: Three times a day (TID) | INTRAMUSCULAR | 11 refills | Status: DC

## 2021-03-07 MED ORDER — EMPAGLIFLOZIN 25 MG PO TABS: 25.0000 mg | ORAL_TABLET | Freq: Every day | ORAL | Status: AC

## 2021-03-07 MED ORDER — POTASSIUM CHLORIDE CRYS ER 20 MEQ PO TBCR: 40.0000 meq | EXTENDED_RELEASE_TABLET | Freq: Two times a day (BID) | ORAL | Status: AC

## 2021-03-07 MED ORDER — INSULIN ASPART 100 UNIT/ML IJ SOLN: 0.0000 [IU] | Freq: Three times a day (TID) | INTRAMUSCULAR | 11 refills | Status: DC

## 2021-03-07 MED ORDER — FAMOTIDINE 20 MG PO TABS: 20.0000 mg | ORAL_TABLET | Freq: Every day | ORAL | Status: AC

## 2021-03-07 NOTE — Progress Notes (Signed)
[facility] PROGRESS NOTE  Pamela [name] EVO:[id number] DOB: May 25, 1956 DOA: 02/08/2021 PCP: Corwin Levins, MD  Status: Remains inpatient appropriate because:Unsafe d/c plan patient is unable to independently stand for more than 1 minute and we are continuing aggressive PT and OT   Dispo: The patient is from: Home              Anticipated d/c is to: [facility]-waiting on insurance approval              Patient currently is medically stable to d/c.   Difficult to place patient Yes              Barriers to discharge: Has utilized all insurance days for SNF rehab or acute rehab.  Will be eligible for Medicare next month and once obtains initial Medicare number IR will accept patient   Level of care: Telemetry Cardiac  Code Status: DNR Family Communication: Patient only DVT prophylaxis: Eliquis COVID vaccination status: Pfizer 01/24/2020, 04/03/2020.  Eligible for booster  HPI: 65 year old with past medical history significant for permanent A. fib on Eliquis, chronic diastolic heart failure, CKD stage IIIa, diabetes type 2, hypertension, hyperlipidemia, depression, anxiety presented to the ED for evaluation of right leg swelling. Recently hospitalized 12/17/2020 until 12/21/2020 for lower GI bleed related to diverticulosis and stercoral ulcer.  She was discharged to SNF.  Patient was sent home from skilled nursing facility on 02/07/2021 as her insurance will not pay for further days, patient continued to have knee and hip problems with generalized debility.  Also she did not get a prescription for Eliquis and had not taken it for 3 days.  On 9/1 she noticed increasing swelling and erythema to her right lower leg and presented for evaluation.   Subjective: In bed.  Exercise band attached to right side rail and patient working on upper body strengthening.  Objective: Vitals:   03/06/21 1722 03/06/21 2014  BP: 111/65 102/65  Pulse: 80 79  Resp:  18  Temp:  98.6 F (37 C)  SpO2:   95%    Intake/Output Summary (Last 24 hours) at 03/07/2021 0739 Last data filed at 03/07/2021 2993 Gross per 24 hour  Intake 1794 ml  Output 1690 ml  Net 104 ml   Filed Weights   03/04/21 0541 03/05/21 0357 03/06/21 0432  Weight: (!) 151.2 kg (!) 150.5 kg (!) 151.6 kg    Exam: Constitutional: NAD, calm Respiratory: Lungs CTA, RA, normal pulse oximetry Cardiovascular: Regular pulse, S1 and S2, skin warm and dry with adequate capillary refill Abdomen: Soft and nontender with normoactive bowel sounds. LBM 9/27  Tolerating oral diet Neurologic: CN 2-12 grossly intact. Sensation intact, DTR normal. Strength 5/5 x all 4 extremities.  Psychiatric: Normal judgment and insight. Alert and oriented x 3.     Assessment/Plan: Acute problems: Cellulitis of the right lower extremity age indeterminate right leg DVT: Doppler on 02/09/2021, age indeterminate right popliteal vein thrombosis.   Has completed 10 days of antibiotics  Continue Eliquis.   Permanent A. fib:  Eliquis, diltiazem and Toprol-rate controlled  Recurrent seasonal allergies Symptoms have improved with combination BID Zyrtec with Pepcid x 3 days Continue the same medications daily   General debility, chronic bilateral knee pain, right hip pain: Awaiting insurance authorization for [facility] inpatient rehabilitation  Mild renal insufficiency Peak creatinine 1.42 with a GFR of 41 on 9/5 and now back to baseline Baseline creatinine 0.89 through 1.24 with average GFR greater than 6 Stable monitor  Chronic  lower extremity edema Continue Lasix Renal function stable thus far with resumption of Jardiance   Hypokalemia:  Continues to have suboptimal potassium readings (< 4.0) -repeat labs in a.m. Continue potassium 60 mEq daily   Diabetic neuropathy:  Continue with Lyrica   Type II Diabetes:  Patient was on Jardiance, Actos and glipizide prior to admission but was not on insulin. Actos discontinued secondary to edema and  history of heart failure. Metformin resumed GFR has recovered-HgbA1c June 2022 was 6.9  Nutrition BMI elevated without any evidence of malnutrition Continue carbohydrate modified diet Body mass index is 45.33 kg/m.    Stage II right buttock decubitus Wound / Incision (Open or Dehisced) 02/09/21 Non-pressure wound Tibial Posterior;Proximal;Right Open blister secondary to cellulitis (Active)  Date First Assessed/Time First Assessed: 02/09/21 1321   Wound Type: Non-pressure wound  Location: Tibial  Location Orientation: Posterior;Proximal;Right  Wound Description (Comments): Open blister secondary to cellulitis  Present on Admission: Yes    Assessments 02/09/2021  1:00 PM 03/06/2021 10:12 AM  Dressing Type Foam - Lift dressing to assess site every shift None  Dressing Changed New Other (Comment)  Dressing Status Dry;Intact;Clean None  Dressing Change Frequency PRN --  Site / Wound Assessment -- Pink;Dry  Wound Length (cm) 4 cm --  Wound Width (cm) 5 cm --  Wound Depth (cm) 0 cm --  Wound Volume (cm^3) 0 cm^3 --  Wound Surface Area (cm^2) 20 cm^2 --  Drainage Amount Minimal None  Drainage Description Sanguineous;No odor --     No Linked orders to display     Wound / Incision (Open or Dehisced) 02/09/21 (ITD) Intertriginous Dermatitis Buttocks Circumferential red and raised rash (Active)  Date First Assessed/Time First Assessed: 02/09/21 1323   Wound Type: (ITD) Intertriginous Dermatitis  Location: (c) Buttocks  Location Orientation: Circumferential  Wound Description (Comments): red and raised rash  Present on Admission: Yes    Assessments 02/09/2021  1:00 PM 03/05/2021 10:03 AM  Dressing Type Foam - Lift dressing to assess site every shift None  Dressing Changed New --  Dressing Status Dry;Intact;Clean Clean;Dry;Intact  Dressing Change Frequency PRN PRN  Site / Wound Assessment -- Clean  Peri-wound Assessment -- Intact  Drainage Amount None None  Treatment Cleansed --     No Linked  orders to display     Pressure Injury 02/09/21 Buttocks Right Stage 2 -  Partial thickness loss of dermis presenting as a shallow open injury with a red, pink wound bed without slough. (Active)  Date First Assessed/Time First Assessed: 02/09/21 2059   Location: Buttocks  Location Orientation: Right  Staging: Stage 2 -  Partial thickness loss of dermis presenting as a shallow open injury with a red, pink wound bed without slough.  Present on A...    Assessments 02/09/2021  8:59 PM 03/05/2021 10:03 AM  Dressing Type Foam - Lift dressing to assess site every shift Foam - Lift dressing to assess site every shift  Dressing -- Clean;Intact;Dry  Dressing Change Frequency -- PRN  Site / Wound Assessment -- Dressing in place / Unable to assess  Peri-wound Assessment -- Intact  Drainage Amount -- None     No Linked orders to display      Data Reviewed: Basic Metabolic Panel: Recent Labs  Lab 03/02/21 0347 03/03/21 0251 03/04/21 0322 03/07/21 0509  NA 135 137 135 138  K 3.3* 3.6 3.7 3.0*  CL 97* 99 97* 100  CO2 29 29 28 28   GLUCOSE 131* 157* 119* 79  BUN 22 21 22 21   CREATININE 1.38* 1.32* 1.56* 1.51*  CALCIUM 9.4 9.1 9.3 9.2  MG  --  2.2 2.3  --   PHOS  --  4.5 4.0  --     CBC: Recent Labs  Lab 03/03/21 0251  WBC 11.6*  HGB 11.2*  HCT 37.2  MCV 84.7  PLT 356    Cardiac Enzymes: Recent Labs  Lab 03/03/21 0251  CKTOTAL 15*   BNP (last 3 results) Recent Labs    02/08/21 2113 03/03/21 0251  BNP 210.6* 92.4    ProBNP (last 3 results) Recent Labs    03/20/20 1145 06/21/20 1129  PROBNP 125.0* 139.0*    CBG: Recent Labs  Lab 03/06/21 0601 03/06/21 1100 03/06/21 1650 03/06/21 2113 03/07/21 0639  GLUCAP 89 114* 83 122* 82     Scheduled Meds:  apixaban  5 mg Oral BID   atorvastatin  40 mg Oral Daily   diltiazem  360 mg Oral Daily   empagliflozin  25 mg Oral Daily   famotidine  20 mg Oral Daily   ferrous sulfate  325 mg Oral Q24H   furosemide  40 mg  Oral BID   Gerhardt's butt cream   Topical BID   glipiZIDE  10 mg Oral Q breakfast   insulin aspart  0-9 Units Subcutaneous TID WC   insulin aspart  3 Units Subcutaneous TID WC   loratadine  10 mg Oral Daily   metFORMIN  500 mg Oral BID WC   metoprolol succinate  100 mg Oral Daily   potassium chloride  40 mEq Oral BID   pregabalin  50 mg Oral TID   sodium chloride flush  3 mL Intravenous Q12H    Principal Problem:   Cellulitis of right lower extremity Active Problems:   Type 2 diabetes mellitus (HCC)   Hypertension associated with diabetes (HCC)   CKD (chronic kidney disease) stage 3, GFR 30-59 ml/min (HCC)   Chronic atrial fibrillation (HCC)   Chronic diastolic heart failure (HCC)   Hyperlipidemia associated with type 2 diabetes mellitus (HCC)   Pressure injury of skin   Consultants: None  Procedures: None  Antibiotics: IV cefazolin 9/2 through 9/6 PO cephalexin 9/7 through 9/12 PO doxycycline 9/8 through 9/12  Time spent: 15 minutes    11/12 ANP  [facility] 7 am - 330 pm/M-F for direct patient care and secure chat Please refer to Amion for contact info 23  days

## 2021-03-07 NOTE — Progress Notes (Signed)
Occupational Therapy Treatment Patient Details Name: Pamela [name] MRN: [id number] DOB: 12/25/1955 Today's Date: 03/07/2021   History of present illness Pt is a 65 y.o. female admitted 02/08/21 with RLE swelling and erythema. Workup for RLE cellulitis, age-indeterminate RLE DVT. CXR with cardiomegaly, vascular congestion. PMH includes anxiety, depression, afib, CHF, DM2, HTN, rosacea, migraines, R knee DJD. Of note, admission 7/11-7/15/22 for GIB with discharge to SNF with recent return home 02/07/21.   OT comments  Focus of session on UB exercises with level 3 theraband. Tolerated well. Pt to perform exercises 3 x a day, 20 reps.    Recommendations for follow up therapy are one component of a multi-disciplinary discharge planning process, led by the attending physician.  Recommendations may be updated based on patient status, additional functional criteria and insurance authorization.    Follow Up Recommendations   (Inpatient Rehab)    Equipment Recommendations  Wheelchair (measurements OT);Wheelchair cushion (measurements OT);Tub/shower bench (drop arm, bariatric BSC)    Recommendations for Other Services      Precautions / Restrictions Precautions Precautions: Fall;Other (comment) Precaution Comments: Bladder/bowel incontinence       Mobility Bed Mobility Overal bed mobility: Modified Independent                  Transfers                      Balance Overall balance assessment: Needs assistance Sitting-balance support: No upper extremity supported;Feet supported Sitting balance-[name]: Good                                     ADL either performed or assessed with clinical judgement   ADL                                               Vision       Perception     Praxis      Cognition Arousal/Alertness: Awake/alert Behavior During Therapy: WFL for tasks assessed/performed Overall Cognitive Status: Within  Functional Limits for tasks assessed                                          Exercises Exercises: General Upper Extremity General Exercises - Upper Extremity Shoulder Flexion: Strengthening;Both;20 reps;Theraband Theraband Level (Shoulder Flexion): Level 3 (Green) Shoulder Horizontal ABduction: Strengthening;Both;20 reps;Theraband Theraband Level (Shoulder Horizontal Abduction): Level 3 (Green) Elbow Flexion: Strengthening;20 reps;Theraband;Both Theraband Level (Elbow Flexion): Level 3 (Green) Elbow Extension: Strengthening;Both;20 reps;Theraband Theraband Level (Elbow Extension): Level 3 (Green)   Shoulder Instructions       General Comments      Pertinent Vitals/ Pain       Pain Assessment: No/denies pain  Home Living                                          Prior Functioning/Environment              Frequency  Min 2X/week        Progress Toward Goals  OT Goals(current goals can now be found in the  care plan section)  Progress towards OT goals: Progressing toward goals  Acute Rehab OT Goals Patient Stated Goal: to stand better and go to inpt rehab OT Goal Formulation: With patient Time For Goal Achievement: 03/11/21 Potential to Achieve Goals: Good  Plan Discharge plan needs to be updated    Co-evaluation                 AM-PAC OT "6 Clicks" Daily Activity     Outcome Measure   Help from another person eating meals?: None Help from another person taking care of personal grooming?: A Little Help from another person toileting, which includes using toliet, bedpan, or urinal?: A Lot Help from another person bathing (including washing, rinsing, drying)?: A Lot Help from another person to put on and taking off regular upper body clothing?: A Little Help from another person to put on and taking off regular lower body clothing?: Total 6 Click Score: 15    End of Session    OT Visit Diagnosis: Muscle weakness  (generalized) (M62.81)   Activity Tolerance Patient tolerated treatment well   Patient Left in bed;with call bell/phone within reach   Nurse Communication          Time: 3086-5784 OT Time Calculation (min): 30 min  Charges: OT General Charges $OT Visit: 1 Visit OT Treatments $Therapeutic Exercise: 23-37 mins  Martie Round, OTR/L Acute Rehabilitation Services Pager: [phone] Office: [phone]  Evern Bio 03/07/2021, 1:15 PM

## 2021-03-08 DIAGNOSIS — L03115 Cellulitis of right lower limb: Secondary | ICD-10-CM | POA: Diagnosis not present

## 2021-03-08 LAB — GLUCOSE, CAPILLARY
Glucose-Capillary: 110 mg/dL — ABNORMAL HIGH (ref 70–99)
Glucose-Capillary: 99 mg/dL (ref 70–99)
Glucose-Capillary: 140 mg/dL — ABNORMAL HIGH (ref 70–99)

## 2021-03-08 MED ORDER — ADULT MULTIVITAMIN W/MINERALS CH
1.0000 | ORAL_TABLET | Freq: Every day | ORAL | Status: DC
  Administered 2021-03-08 – 2021-03-19 (×12): 1 via ORAL
  Filled 2021-03-08 (×13): qty 1

## 2021-03-08 MED ORDER — ENSURE MAX PROTEIN PO LIQD
11.0000 [oz_av] | Freq: Every day | ORAL | Status: DC
  Administered 2021-03-08 – 2021-03-18 (×10): 11 [oz_av] via ORAL
  Filled 2021-03-08 (×11): qty 330

## 2021-03-08 NOTE — Progress Notes (Addendum)
[facility] PROGRESS NOTE  Pamela [name] GLO:[id number] DOB: Aug 22, 1955 DOA: 02/08/2021 PCP: Corwin Levins, MD  Status: Remains inpatient appropriate because:Unsafe d/c plan patient is unable to independently stand for more than 1 minute and we are continuing aggressive PT and OT   Dispo: The patient is from: Home              Anticipated d/c is to: [facility]-waiting on insurance approval-recent request for IR has been denied.  Peer-to-peer scheduled today at 1:30 PM              Patient currently is medically stable to d/c.   Difficult to place patient Yes              Barriers to discharge: Has utilized all insurance days for SNF rehab or acute rehab.  Will be eligible for Medicare next month and once obtains initial Medicare number IR will accept patient   Level of care: Telemetry Cardiac  Code Status: DNR Family Communication: Patient only DVT prophylaxis: Eliquis COVID vaccination status: Pfizer 01/24/2020, 04/03/2020.  Eligible for booster  HPI: 65 year old with past medical history significant for permanent A. fib on Eliquis, chronic diastolic heart failure, CKD stage IIIa, diabetes type 2, hypertension, hyperlipidemia, depression, anxiety presented to the ED for evaluation of right leg swelling. Recently hospitalized 12/17/2020 until 12/21/2020 for lower GI bleed related to diverticulosis and stercoral ulcer.  She was discharged to SNF.  Patient was sent home from skilled nursing facility on 02/07/2021 as her insurance will not pay for further days, patient continued to have knee and hip problems with generalized debility.  Also she did not get a prescription for Eliquis and had not taken it for 3 days.  On 9/1 she noticed increasing swelling and erythema to her right lower leg and presented for evaluation.   Subjective: Awake.  Sitting up in bed reading.  No specific complaints.  Objective: Vitals:   03/07/21 2027 03/08/21 0617  BP: 106/71 122/63  Pulse: 86 85  Resp:  18 20  Temp: 98.7 F (37.1 C) 98.4 F (36.9 C)  SpO2: 93% 92%    Intake/Output Summary (Last 24 hours) at 03/08/2021 0758 Last data filed at 03/08/2021 1884 Gross per 24 hour  Intake 240 ml  Output 4000 ml  Net -3760 ml   Filed Weights   03/05/21 0357 03/06/21 0432 03/08/21 0617  Weight: (!) 150.5 kg (!) 151.6 kg (!) 150.5 kg    Exam: Constitutional: NAD, calm Respiratory: Lungs CTA, RA, normal pulse oximetry Cardiovascular: Regular pulse, S1 and S2, skin warm and dry with adequate capillary refill Abdomen: Soft and nontender with normoactive bowel sounds. LBM 9/30  Tolerating oral diet Neurologic: CN 2-12 grossly intact. Sensation intact, DTR normal. Strength 4/5 x all 4 extremities while in bed Marked decrease strength with ambulation.  Also has significant decreased upper extremity strength with activities at about 2-4/5 Psychiatric: Normal judgment and insight. Alert and oriented x 3.     Assessment/Plan: Acute problems: Cellulitis of the right lower extremity age indeterminate right leg DVT: Doppler on 02/09/2021, age indeterminate right popliteal vein thrombosis.   Has completed 10 days of antibiotics for cellulitis Continue Eliquis.   Permanent A. fib:  Eliquis, diltiazem and Toprol-rate controlled  Recurrent seasonal allergies Symptoms have improved with combination BID Zyrtec with Pepcid x 3 days Continue the same medications daily   General debility, chronic bilateral knee pain, right hip pain: Awaiting insurance authorization for [facility] inpatient rehabilitation Although while sitting  in the bed strength is adequate with upper extremity strengthening somewhat less at about 4-5 once she stands her tolerance decreases therefore decreasing her strength.  Her upper extremity strength also is quite diminished with attempts to push up from the bed and is estimated to be about 2-3.  She is improving but continues to stand only for about 1 or 2 minutes.  Is a max assist and  has been unable to ambulate even in room with a rolling walker.  Patient lives alone and would have no one to assist her.  She would be unable to obtain food, toilet or otherwise care for herself independently.  Even these findings she is unsafe to discharge home independent  Obesity Body mass index is 45 kg/m.  Degree of obesity in context of underlying osteoarthritis has contributed to patient's severe debility and profound physical deconditioning Will obtain nutrition consult for weight loss education  Mild renal insufficiency Peak creatinine 1.42 with a GFR of 41 on 9/5 and now back to baseline Baseline creatinine 0.89 through 1.24 with average GFR greater than 6 Stable monitor  Chronic lower extremity edema Continue Lasix Renal function stable thus far with resumption of Jardiance   Hypokalemia:  Continues to have suboptimal potassium readings (< 4.0) -repeat labs in a.m. Continue potassium 60 mEq daily   Diabetic neuropathy:  Continue with Lyrica   Type II Diabetes:  Patient was on Jardiance, Actos and glipizide prior to admission but was not on insulin. Actos discontinued secondary to edema and history of heart failure. Metformin resumed GFR has recovered-HgbA1c June 2022 was 6.9  Nutrition BMI elevated without any evidence of malnutrition Continue carbohydrate modified diet Body mass index is 45 kg/m.    Stage II right buttock decubitus Wound / Incision (Open or Dehisced) 02/09/21 Non-pressure wound Tibial Posterior;Proximal;Right Open blister secondary to cellulitis (Active)  Date First Assessed/Time First Assessed: 02/09/21 1321   Wound Type: Non-pressure wound  Location: Tibial  Location Orientation: Posterior;Proximal;Right  Wound Description (Comments): Open blister secondary to cellulitis  Present on Admission: Yes    Assessments 02/09/2021  1:00 PM 03/07/2021 11:00 PM  Dressing Type Foam - Lift dressing to assess site every shift Foam - Lift dressing to assess  site every shift  Dressing Changed New New  Dressing Status Dry;Intact;Clean Clean;Dry  Dressing Change Frequency PRN --  Site / Wound Assessment -- Pink  Wound Length (cm) 4 cm --  Wound Width (cm) 5 cm --  Wound Depth (cm) 0 cm --  Wound Volume (cm^3) 0 cm^3 --  Wound Surface Area (cm^2) 20 cm^2 --  Drainage Amount Minimal --  Drainage Description Sanguineous;No odor --     No Linked orders to display     Wound / Incision (Open or Dehisced) 02/09/21 (ITD) Intertriginous Dermatitis Buttocks Circumferential red and raised rash (Active)  Date First Assessed/Time First Assessed: 02/09/21 1323   Wound Type: (ITD) Intertriginous Dermatitis  Location: (c) Buttocks  Location Orientation: Circumferential  Wound Description (Comments): red and raised rash  Present on Admission: Yes    Assessments 02/09/2021  1:00 PM 03/07/2021 11:00 PM  Dressing Type Foam - Lift dressing to assess site every shift Foam - Lift dressing to assess site every shift  Dressing Changed New New  Dressing Status Dry;Intact;Clean Clean  Dressing Change Frequency PRN PRN  Site / Wound Assessment -- Clean;Pink  Drainage Amount None --  Treatment Cleansed --     No Linked orders to display     Pressure Injury  02/09/21 Buttocks Right Stage 2 -  Partial thickness loss of dermis presenting as a shallow open injury with a red, pink wound bed without slough. (Active)  Date First Assessed/Time First Assessed: 02/09/21 2059   Location: Buttocks  Location Orientation: Right  Staging: Stage 2 -  Partial thickness loss of dermis presenting as a shallow open injury with a red, pink wound bed without slough.  Present on A...    Assessments 02/09/2021  8:59 PM 03/07/2021 11:00 PM  Dressing Type Foam - Lift dressing to assess site every shift Foam - Lift dressing to assess site every shift  Dressing -- Clean  Site / Wound Assessment -- Dressing in place / Unable to assess  Peri-wound Assessment -- Intact  Drainage Amount -- None      No Linked orders to display      Data Reviewed: Basic Metabolic Panel: Recent Labs  Lab 03/02/21 0347 03/03/21 0251 03/04/21 0322 03/07/21 0509  NA 135 137 135 138  K 3.3* 3.6 3.7 3.0*  CL 97* 99 97* 100  CO2 29 29 28 28   GLUCOSE 131* 157* 119* 79  BUN 22 21 22 21   CREATININE 1.38* 1.32* 1.56* 1.51*  CALCIUM 9.4 9.1 9.3 9.2  MG  --  2.2 2.3  --   PHOS  --  4.5 4.0  --     CBC: Recent Labs  Lab 03/03/21 0251  WBC 11.6*  HGB 11.2*  HCT 37.2  MCV 84.7  PLT 356    Cardiac Enzymes: Recent Labs  Lab 03/03/21 0251  CKTOTAL 15*   BNP (last 3 results) Recent Labs    02/08/21 2113 03/03/21 0251  BNP 210.6* 92.4    ProBNP (last 3 results) Recent Labs    03/20/20 1145 06/21/20 1129  PROBNP 125.0* 139.0*    CBG: Recent Labs  Lab 03/07/21 0639 03/07/21 1131 03/07/21 1616 03/07/21 2109 03/08/21 0602  GLUCAP 82 88 131* 112* 110*     Scheduled Meds:  apixaban  5 mg Oral BID   atorvastatin  40 mg Oral Daily   diltiazem  360 mg Oral Daily   empagliflozin  25 mg Oral Daily   famotidine  20 mg Oral Daily   ferrous sulfate  325 mg Oral Q24H   furosemide  40 mg Oral BID   Gerhardt's butt cream   Topical BID   glipiZIDE  10 mg Oral Q breakfast   insulin aspart  0-9 Units Subcutaneous TID WC   insulin aspart  3 Units Subcutaneous TID WC   loratadine  10 mg Oral Daily   metFORMIN  500 mg Oral BID WC   metoprolol succinate  100 mg Oral Daily   potassium chloride  40 mEq Oral BID   pregabalin  50 mg Oral TID   sodium chloride flush  3 mL Intravenous Q12H    Principal Problem:   Cellulitis of right lower extremity Active Problems:   Type 2 diabetes mellitus (HCC)   Hypertension associated with diabetes (HCC)   CKD (chronic kidney disease) stage 3, GFR 30-59 ml/min (HCC)   Chronic atrial fibrillation (HCC)   Chronic diastolic heart failure (HCC)   Hyperlipidemia associated with type 2 diabetes mellitus (HCC)   Pressure injury of  skin   Consultants: None  Procedures: None  Antibiotics: IV cefazolin 9/2 through 9/6 PO cephalexin 9/7 through 9/12 PO doxycycline 9/8 through 9/12  Time spent: 15 minutes    11/8 ANP  [facility] 7 am - 330 pm/M-F  for direct patient care and secure chat Please refer to Amion for contact info 24  days

## 2021-03-08 NOTE — Progress Notes (Signed)
CSW spoke with NP who states during peer to peer with Rosann Auerbach, additional clinicals were requested.  CSW spoke with Verdon Cummins of [facility] who will submit additional clinicals.  CSW informed patient of information.  Edwin Dada, MSW, LCSW Transitions of Care  Clinical Social Worker II [phone]

## 2021-03-08 NOTE — Progress Notes (Signed)
Initial Nutrition Assessment  DOCUMENTATION CODES:   Morbid obesity  INTERVENTION:   -MVI with minerals daily -Ensure Max po BID, each supplement provides 150 kcal and 30 grams of protein  NUTRITION DIAGNOSIS:   Increased nutrient needs related to wound healing as evidenced by estimated needs.  GOAL:   Patient will meet greater than or equal to 90% of their needs  MONITOR:   PO intake, Supplement acceptance, Diet advancement, Labs, Weight trends, Skin, I & O's  REASON FOR ASSESSMENT:   Consult Assessment of nutrition requirement/status  ASSESSMENT:   65 year old with past medical history significant for permanent A. fib on Eliquis, chronic diastolic heart failure, CKD stage IIIa, diabetes type 2, hypertension, hyperlipidemia, depression, anxiety presented to the ED for evaluation of right leg swelling.  Pt admitted with cellulitis of rt lower extremity.   Reviewed I/O's: -3.8 L x 24 hours and -18.1 L since 02/22/21  UOP: 4 L x 24 hours  Per MD notes, pt insurance provider is requesting documentation regarding weight loss educations, as they believe that her obesity is hindering her progress.   Spoke with pt at bedside, who reports frustration over prolonged hospitalization. She is proud of the progress she made with therapy today and was able to sit at the edge of the bed and stand multiple times. Pt shares that she is stubborn, but has the motivation to put forth the effort to get better and is very eager to discharge back home once she is able. She shared with this RD experiences in her life where she had hardship, but was able to overcome it (ex. Working and going to school full time while dealing with her mother's death and estate).   Pt reports that she has a good appetite and is consuming 100% of her meals. PTA, pt reports that she is typically a healthy eater and consumed 3 meals per day (Breakfast: unsweetened tea and sausage and egg biscuit from [employer]; Lunch:  sandwich and fruit; Dinner: meat, starch, and vegetable. She also stored yogurt and fruit for snacks. Beverages included selter water, tea, and one diet soda daily). Pt disclosed that her eating habits changed drastically after the pandemic, due to her transitioning to working at home and job stress. She relied on oatmeal and meals from [employer] due to convenience. She has been more mindful of her choices during hospitalization.   Reviewed wt hx; pt has experienced a 5.3% wt loss over the past month, likely secondary to diuresis (-18.1 L since 02/22/21).   Obesity is a complex, chronic medical condition that is optimally managed by a multidisciplinary care team. Weight loss is not an ideal goal for an acute inpatient hospitalization. However, if further work-up for obesity is warranted, consider outpatient referral to outpatient bariatric service and/or [facility].    Pt also with increased nutritional needs secondary to acute illness as well as wound healing. Optimizing pt's nutritional status to target wound healing and preservation of lean body mass will assist with pt's debility; RD to add low carbohydrate, high protein nutritional supplements to help pt meet her nutritional goals.   Medications reviewed and include cardizem, ferrous sulfate, and lasix.   Lab Results  Component Value Date   HGBA1C 7.9 (H) 03/03/2021   PTA DM medications are 30 mg actos daily, 10 mg glipizide daily, and 25 mg empagflilozin daily.   Labs reviewed: CBGS: 88-131 (inpatient orders for glycemic control are 25 mg empagliflozin daily, 10 mg glipizide daily, 0-9  units insulin aspart TID with meals, 3 units insulin aspart TID with meals, and 500 mg metofrmin BID).    NUTRITION - FOCUSED PHYSICAL EXAM:  Flowsheet Row Most Recent Value  Orbital Region No depletion  Upper Arm Region No depletion  Thoracic and Lumbar Region No depletion  Buccal Region No depletion  Temple  Region No depletion  Clavicle Bone Region No depletion  Clavicle and Acromion Bone Region No depletion  Scapular Bone Region No depletion  Dorsal Hand No depletion  Patellar Region No depletion  Anterior Thigh Region No depletion  Posterior Calf Region No depletion  Edema (RD Assessment) Mild  Hair Reviewed  Eyes Reviewed  Mouth Reviewed  Skin Reviewed  Nails Reviewed       Diet Order:   Diet Order             Diet - low sodium heart healthy           Diet heart healthy/carb modified Room service appropriate? Yes; Fluid consistency: Thin  Diet effective now                   EDUCATION NEEDS:   Education needs have been addressed  Skin:  Skin Assessment: Skin Integrity Issues: Skin Integrity Issues:: Stage II, Other (Comment) Stage II: rt buttocks Other: ITD buttocks, non-pressure wound to rt tibia  Last BM:  03/08/21  Height:   Ht Readings from Last 1 Encounters:  02/09/21 6' (1.829 m)    Weight:   Wt Readings from Last 1 Encounters:  03/08/21 (!) 150.5 kg    Ideal Body Weight:  72.7 kg  BMI:  Body mass index is 45 kg/m.  Estimated Nutritional Needs:   Kcal:  2200-2400  Protein:  130-145 grams  Fluid:  > 2 L    Levada Schilling, RD, LDN, CDCES Registered Dietitian II Certified Diabetes Care and Education Specialist Please refer to [facility] for RD and/or RD on-call/weekend/after hours pager

## 2021-03-08 NOTE — Progress Notes (Signed)
Physical Therapy Treatment Patient Details Name: BLANCH STANG MRN: [id number] DOB: Aug 02, 1955 Today's Date: 03/08/2021   History of Present Illness Pt is a 65 y.o. female admitted 02/08/21 with RLE swelling and erythema. Workup for RLE cellulitis, age-indeterminate RLE DVT. CXR with cardiomegaly, vascular congestion. PMH includes anxiety, depression, afib, CHF, DM2, HTN, rosacea, migraines, R knee DJD. Of note, admission 7/11-7/15/22 for GIB with discharge to SNF with recent return home 02/07/21.    PT Comments    Pt is making great progress with mobility, successfully standing 3/4 times from elevated EOB with pt pulling up on posterior aspect of recliner. Pt did need maxAx2 to do so, but has progressed away from needing the stedy. In addition, she was limited by apparent anxiety once in full standing position. However, she was motivated to attempt again, increasing time in standing from 1 second to ~7 seconds by the final rep. Will continue to follow acutely. She would greatly benefit from intensive therapy in the inpt rehab setting to maximize her functional mobility prior to returning home.   Recommendations for follow up therapy are one component of a multi-disciplinary discharge planning process, led by the attending physician.  Recommendations may be updated based on patient status, additional functional criteria and insurance authorization.  Follow Up Recommendations  CIR;Supervision for mobility/OOB (IPR)     Equipment Recommendations  Other (comment) (tbd next venue)    Recommendations for Other Services       Precautions / Restrictions Precautions Precautions: Fall;Other (comment) Precaution Comments: Bladder/bowel incontinence Restrictions Weight Bearing Restrictions: No     Mobility  Bed Mobility Overal bed mobility: Needs Assistance Bed Mobility: Supine to Sit;Rolling Rolling: Supervision   Supine to sit: Supervision;HOB elevated     General bed mobility comments:  Supervision for safety with pt using bed rails and controls for all bed mobility aspects.    Transfers Overall transfer level: Needs assistance Equipment used:  (posterior aspect of recliner) Transfers: Sit to/from Stand;Lateral/Scoot Transfers Sit to Stand: From elevated surface;Max assist;+2 physical assistance;+2 safety/equipment        Lateral/Scoot Transfers: Min assist;+2 safety/equipment;From elevated surface General transfer comment: Sit <> stand 4x from elevated EOB to posterior aspect of recliner with pt being successful to obtain full upright standing position 3x with maxAx2 and use of bed pad under buttocks to assist in extending hips. Increased stance time from 1 second on first successful rep to ~7 sec by final rep, cuing pt to breathe and keep her eyes open as she appeared to get anxious but "could not describe it". Cues provided for feet placement and rocking of trunk to gain momentum and placement of "nose over toes". Lateral scoot from elevated EOB towards L to recliner with arm dropped with minA, +2 for safety.  Ambulation/Gait             General Gait Details: unable   Stairs             Wheelchair Mobility    Modified Rankin (Stroke Patients Only)       Balance Overall balance assessment: Needs assistance Sitting-balance support: No upper extremity supported;Feet supported Sitting balance-[name]: Good     Standing balance support: Bilateral upper extremity supported Standing balance-[name]: Poor Standing balance comment: MaxAx2 with use of bed pad to extend hips to stand with pt pulling on posterior aspect of recliner. Increased stance time from 1 second on first successful rep to ~7 sec by final rep, cuing pt to breathe and keep her  eyes open as she appeared to get anxious but "could not describe it".                            Cognition Arousal/Alertness: Awake/alert Behavior During Therapy: Anxious Overall Cognitive  Status: Within Functional Limits for tasks assessed                                 General Comments: pleasant and agreeable to participate in session. Very motivated.      Exercises      General Comments        Pertinent Vitals/Pain Pain Assessment: No/denies pain    Home Living                      Prior Function            PT Goals (current goals can now be found in the care plan section) Acute Rehab PT Goals Patient Stated Goal: to stand better and go to inpt rehab PT Goal Formulation: With patient Time For Goal Achievement: 03/11/21 Potential to Achieve Goals: Fair Progress towards PT goals: Progressing toward goals    Frequency    Min 3X/week      PT Plan Current plan remains appropriate    Co-evaluation              AM-PAC PT "6 Clicks" Mobility   Outcome Measure  Help needed turning from your back to your side while in a flat bed without using bedrails?: A Little Help needed moving from lying on your back to sitting on the side of a flat bed without using bedrails?: A Little Help needed moving to and from a bed to a chair (including a wheelchair)?: A Little Help needed standing up from a chair using your arms (e.g., wheelchair or bedside chair)?: Total Help needed to walk in hospital room?: Total Help needed climbing 3-5 steps with a railing? : Total 6 Click Score: 12    End of Session Equipment Utilized During Treatment: Gait belt Activity Tolerance: Patient tolerated treatment well Patient left: with call bell/phone within reach;in chair;with chair alarm set   PT Visit Diagnosis: Unsteadiness on feet (R26.81);Muscle weakness (generalized) (M62.81);Difficulty in walking, not elsewhere classified (R26.2) Pain - Right/Left: Right Pain - part of body: Leg     Time: 4193-7902 PT Time Calculation (min) (ACUTE ONLY): 21 min  Charges:  $Therapeutic Activity: 8-22 mins                     Raymond Gurney, PT, DPT Acute  Rehabilitation Services  Pager: [phone] Office: [phone]    Jewel Baize 03/08/2021, 1:41 PM

## 2021-03-09 DIAGNOSIS — M159 Polyosteoarthritis, unspecified: Secondary | ICD-10-CM

## 2021-03-09 DIAGNOSIS — I5032 Chronic diastolic (congestive) heart failure: Secondary | ICD-10-CM | POA: Diagnosis not present

## 2021-03-09 DIAGNOSIS — I482 Chronic atrial fibrillation, unspecified: Secondary | ICD-10-CM | POA: Diagnosis not present

## 2021-03-09 DIAGNOSIS — N179 Acute kidney failure, unspecified: Secondary | ICD-10-CM | POA: Diagnosis not present

## 2021-03-09 DIAGNOSIS — L03115 Cellulitis of right lower limb: Secondary | ICD-10-CM | POA: Diagnosis not present

## 2021-03-09 LAB — RENAL FUNCTION PANEL
Albumin: 2.9 g/dL — ABNORMAL LOW (ref 3.5–5.0)
Anion gap: 10 (ref 5–15)
BUN: 24 mg/dL — ABNORMAL HIGH (ref 8–23)
CO2: 29 mmol/L (ref 22–32)
Calcium: 9.5 mg/dL (ref 8.9–10.3)
Chloride: 100 mmol/L (ref 98–111)
Creatinine, Ser: 1.34 mg/dL — ABNORMAL HIGH (ref 0.44–1.00)
GFR, Estimated: 44 mL/min — ABNORMAL LOW (ref >60)
Glucose, Bld: 107 mg/dL — ABNORMAL HIGH (ref 70–99)
Phosphorus: 4.2 mg/dL (ref 2.5–4.6)
Potassium: 3.3 mmol/L — ABNORMAL LOW (ref 3.5–5.1)
Sodium: 139 mmol/L (ref 135–145)

## 2021-03-09 LAB — CBC
HCT: 36.9 % (ref 36.0–46.0)
Hemoglobin: 10.9 g/dL — ABNORMAL LOW (ref 12.0–15.0)
MCH: 24.6 pg — ABNORMAL LOW (ref 26.0–34.0)
MCHC: 29.5 g/dL — ABNORMAL LOW (ref 30.0–36.0)
MCV: 83.3 fL (ref 80.0–100.0)
Platelets: 280 10*3/uL (ref 150–400)
RBC: 4.43 MIL/uL (ref 3.87–5.11)
RDW: 17 % — ABNORMAL HIGH (ref 11.5–15.5)
WBC: 10.3 10*3/uL (ref 4.0–10.5)
nRBC: 0 % (ref 0.0–0.2)

## 2021-03-09 LAB — GLUCOSE, CAPILLARY
Glucose-Capillary: 85 mg/dL (ref 70–99)
Glucose-Capillary: 90 mg/dL (ref 70–99)
Glucose-Capillary: 111 mg/dL — ABNORMAL HIGH (ref 70–99)
Glucose-Capillary: 160 mg/dL — ABNORMAL HIGH (ref 70–99)

## 2021-03-09 LAB — MAGNESIUM: Magnesium: 2.1 mg/dL (ref 1.7–2.4)

## 2021-03-09 MED ORDER — LINAGLIPTIN 5 MG PO TABS
5.0000 mg | ORAL_TABLET | Freq: Every day | ORAL | Status: DC
  Administered 2021-03-09 – 2021-03-19 (×11): 5 mg via ORAL
  Filled 2021-03-09 (×11): qty 1

## 2021-03-09 MED ORDER — SPIRONOLACTONE 12.5 MG HALF TABLET
12.5000 mg | ORAL_TABLET | Freq: Every day | ORAL | Status: DC
  Administered 2021-03-09: 12.5 mg via ORAL
  Filled 2021-03-09: qty 1

## 2021-03-09 NOTE — Progress Notes (Signed)
PROGRESS NOTE  Pamela [name] STM:[id number] DOB: [birth year]   PCP: Corwin Levins, MD  Patient is from: Home.  Recently discharged from SNF to home.  She lives alone.  DOA: 02/08/2021 LOS: 25  Chief complaints:  Chief Complaint  Patient presents with   Shortness of Breath     Brief Narrative / Interim history: 65 year old F with PMH of permanent A. fib on Eliquis, diastolic CHF, CKD-3A, DM-2, GIB, HTN, HLD, anxiety, depression and recent COVID-19 infection presenting from home with RLE swelling and debility in the setting of knee and hip problem.  Patient was discharged to SNF after recent hospitalization from 7/11-12/2013 for GIB due to diverticulosis and a stercoral ulcer, and discharged home on 02/07/2021 as her insurance was not paying for further care.  She completed 10 days of antibiotic for possible cellulitis.  She remains physically deconditioned due to morbid obesity, chronic osteoarthritis and pain.  Therapy recommended inpatient rehab.   Subjective: Seen and examined earlier this morning.  No major events overnight of this morning.  No complaint this morning.  She states she stood up yesterday with therapy.  She will try to do this again today.  Denies chest pain, dyspnea, GI or UTI symptoms.  Objective: Vitals:   03/08/21 1057 03/08/21 2000 03/09/21 0648 03/09/21 1126  BP: 108/67 (!) 127/52 126/77 113/67  Pulse: 98 80 (!) 106 95  Resp: 18 15 18 20   Temp: 98.8 F (37.1 C) 98.6 F (37 C) 98.7 F (37.1 C) 98 F (36.7 C)  TempSrc: Oral Oral Oral Oral  SpO2: 93% 95% 96% 93%  Weight:   (!) 152.7 kg   Height:        Intake/Output Summary (Last 24 hours) at 03/09/2021 1350 Last data filed at 03/09/2021 1307 Gross per 24 hour  Intake 477 ml  Output 3575 ml  Net -3098 ml   Filed Weights   03/06/21 0432 03/08/21 0617 03/09/21 0648  Weight: (!) 151.6 kg (!) 150.5 kg (!) 152.7 kg    Examination:  GENERAL: No apparent distress.  Nontoxic. HEENT: MMM.  Vision and hearing  grossly intact.  NECK: Supple.  No apparent JVD.  RESP: 93% on RA.  No IWOB.  Fair aeration bilaterally. CVS:  RRR. Heart sounds normal.  ABD/GI/GU: BS+. Abd soft, NTND.  MSK/EXT:  Moves extremities. No apparent deformity.  Trace BLE edema. SKIN: no apparent skin lesion or wound NEURO: Awake and alert. Oriented appropriately.  No apparent focal neuro deficit. PSYCH: Calm. Normal affect.   Procedures:  None  Microbiology summarized: COVID-19 and influenza PCR nonreactive.  Assessment & Plan: RLE cellulitis in the setting of chronic venous insufficiency/edema.   -completed 10 days of antibiotics.  -Discontinue Actos-causes fluid retention. -Continue Lasix and Jardiance -Leg elevation.  Chronic RLE DVT -Continue Eliquis.   Permanent A. fib: Rate controlled.  On high-dose Cardizem CD and Toprol-XL.  -Continue home Cardizem, Toprol-XL and Eliquis. -Would have been ideal to be off Cardizem but no great alternative   Generalized weakness/ambulatory dysfunction in the setting of morbid obesity and chronic b/l knee and right hip pain -See below about morbid obesity -Continue PT/OT-seems to be progressing. -Continue Tylenol as needed for pain    AKI/azotemia: Creatinine improved Recent Labs    02/19/21 0823 02/20/21 1214 02/21/21 0822 02/26/21 0346 02/28/21 0418 03/02/21 0347 03/03/21 0251 03/04/21 0322 03/07/21 0509 03/09/21 1000  BUN 16 17 16 16 18 22 21 22 21  24*  CREATININE 0.95 0.89 1.07* 1.13* 1.44* 1.38* 1.32*  1.56* 1.51* 1.34*  -Continue monitoring intermittently  Refractory hypokalemia: K3.3.  Likely due to Lasix -K-Dur 40 mill equivalent twice daily -Added low-dose Aldactone to counteract hypokalemia due to Lasix -Recheck in the morning   Controlled DM-2 with hyperglycemia and neuropathy: A1c 7.9% (was 6.9% in 11/2020).  On Actos, glipizide, Jardiance and Lantus.  She also reports taking metformin which is not listed.  Recent Labs  Lab 03/08/21 0602  03/08/21 1057 03/08/21 1623 03/09/21 0559 03/09/21 1127  GLUCAP 110* 99 140* 85 90  -Continue current insulin regimen plus home Jardiance. -Continue metformin 500 mg twice daily -Added Tradjenta 5 mg daily -Discontinued Actos in the setting of edema and CHF -Discontinued glipizide in the setting of morbid obesity -Consider adding GLP-1 agonist on discharge  -Continue home statin and Lyrica.   Morbid obesity: this with underlying osteoarthritis and chronic pain limiting patient's mobility Body mass index is 45.66 kg/m. -Encourage lifestyle change to lose weight-main focus is diet due to limited mobility -Discontinue Actos and glipizide.  Added metformin and Tradjenta instead. -Consider adding GLP-1 agonist in the setting of DM-2 Nutrition Problem: Increased nutrient needs Etiology: wound healing Signs/Symptoms: estimated needs Interventions: Premier Protein, MVI  Stage II right buttock decubitus Pressure Injury 02/09/21 Buttocks Right Stage 2 -  Partial thickness loss of dermis presenting as a shallow open injury with a red, pink wound bed without slough. (Active)  02/09/21 2059  Location: Buttocks  Location Orientation: Right  Staging: Stage 2 -  Partial thickness loss of dermis presenting as a shallow open injury with a red, pink wound bed without slough.  Wound Description (Comments):   Present on Admission: Yes   DVT prophylaxis:   apixaban (ELIQUIS) tablet 5 mg  Code Status: DNR/DNI Family Communication: Patient and/or RN. Available if any question.  Level of care: Telemetry Cardiac Status is: Inpatient  Remains inpatient appropriate because:Unsafe d/c plan  Dispo: The patient is from: Home              Anticipated d/c is to:  Inpatient rehab              Patient currently is medically stable to d/c.   Difficult to place patient Yes       Consultants:  None   Sch Meds:  Scheduled Meds:  apixaban  5 mg Oral BID   atorvastatin  40 mg Oral Daily    diltiazem  360 mg Oral Daily   empagliflozin  25 mg Oral Daily   famotidine  20 mg Oral Daily   ferrous sulfate  325 mg Oral Q24H   furosemide  40 mg Oral BID   Gerhardt's butt cream   Topical BID   glipiZIDE  10 mg Oral Q breakfast   insulin aspart  0-9 Units Subcutaneous TID WC   insulin aspart  3 Units Subcutaneous TID WC   loratadine  10 mg Oral Daily   metFORMIN  500 mg Oral BID WC   metoprolol succinate  100 mg Oral Daily   multivitamin with minerals  1 tablet Oral Daily   potassium chloride  40 mEq Oral BID   pregabalin  50 mg Oral TID   Ensure Max Protein  11 oz Oral QHS   sodium chloride flush  3 mL Intravenous Q12H   Continuous Infusions: PRN Meds:.acetaminophen **OR** acetaminophen, albuterol, loperamide, nystatin, ondansetron **OR** ondansetron (ZOFRAN) IV, senna-docusate  Antimicrobials: Anti-infectives (From admission, onward)    Start     Dose/Rate Route Frequency Ordered Stop  02/18/21 0000  doxycycline (VIBRA-TABS) 100 MG tablet  Status:  Discontinued        100 mg Oral Every 12 hours 02/18/21 1257 02/20/21    02/17/21 2200  cephALEXin (KEFLEX) capsule 500 mg        500 mg Oral Every 8 hours 02/17/21 1449 02/18/21 2218   02/16/21 1400  cephALEXin (KEFLEX) capsule 500 mg  Status:  Discontinued        500 mg Oral Every 8 hours 02/16/21 1200 02/17/21 1449   02/14/21 1000  doxycycline (VIBRA-TABS) tablet 100 mg        100 mg Oral Every 12 hours 02/14/21 0849 02/18/21 2217   02/13/21 1400  cephALEXin (KEFLEX) capsule 250 mg  Status:  Discontinued        250 mg Oral Every 8 hours 02/13/21 1005 02/16/21 1200   02/09/21 0030  ceFAZolin (ANCEF) IVPB 2g/100 mL premix  Status:  Discontinued        2 g 200 mL/hr over 30 Minutes Intravenous Every 8 hours 02/09/21 0016 02/13/21 1005        I have personally reviewed the following labs and images: CBC: Recent Labs  Lab 03/03/21 0251 03/09/21 1000  WBC 11.6* 10.3  HGB 11.2* 10.9*  HCT 37.2 36.9  MCV 84.7 83.3   PLT 356 280   BMP &GFR Recent Labs  Lab 03/03/21 0251 03/04/21 0322 03/07/21 0509 03/09/21 1000  NA 137 135 138 139  K 3.6 3.7 3.0* 3.3*  CL 99 97* 100 100  CO2 29 28 28 29   GLUCOSE 157* 119* 79 107*  BUN 21 22 21  24*  CREATININE 1.32* 1.56* 1.51* 1.34*  CALCIUM 9.1 9.3 9.2 9.5  MG 2.2 2.3  --  2.1  PHOS 4.5 4.0  --  4.2   Estimated Creatinine Clearance: 69.3 mL/min (A) (by C-G formula based on SCr of 1.34 mg/dL (H)). Liver & Pancreas: Recent Labs  Lab 03/03/21 0251 03/04/21 0322 03/09/21 1000  ALBUMIN 2.8* 2.9* 2.9*   No results for input(s): LIPASE, AMYLASE in the last 168 hours. No results for input(s): AMMONIA in the last 168 hours. Diabetic: No results for input(s): HGBA1C in the last 72 hours.  Recent Labs  Lab 03/08/21 0602 03/08/21 1057 03/08/21 1623 03/09/21 0559 03/09/21 1127  GLUCAP 110* 99 140* 85 90   Cardiac Enzymes: Recent Labs  Lab 03/03/21 0251  CKTOTAL 15*   Recent Labs    03/20/20 1145 06/21/20 1129  PROBNP 125.0* 139.0*   Coagulation Profile: No results for input(s): INR, PROTIME in the last 168 hours. Thyroid Function Tests: No results for input(s): TSH, T4TOTAL, FREET4, T3FREE, THYROIDAB in the last 72 hours.  Lipid Profile: No results for input(s): CHOL, HDL, LDLCALC, TRIG, CHOLHDL, LDLDIRECT in the last 72 hours. Anemia Panel: No results for input(s): VITAMINB12, FOLATE, FERRITIN, TIBC, IRON, RETICCTPCT in the last 72 hours.  Urine analysis:    Component Value Date/Time   COLORURINE YELLOW 06/21/2020 1129   APPEARANCEUR CLEAR 06/21/2020 1129   LABSPEC 1.010 06/21/2020 1129   PHURINE 5.5 06/21/2020 1129   GLUCOSEU >=1000 (A) 06/21/2020 1129   HGBUR NEGATIVE 06/21/2020 1129   BILIRUBINUR NEGATIVE 06/21/2020 1129   KETONESUR NEGATIVE 06/21/2020 1129   UROBILINOGEN 0.2 06/21/2020 1129   NITRITE NEGATIVE 06/21/2020 1129   LEUKOCYTESUR NEGATIVE 06/21/2020 1129   Sepsis Labs: Invalid input(s): PROCALCITONIN,  LACTICIDVEN  Microbiology: No results found for this or any previous visit (from the past 240 hour(s)).  Radiology Studies: No  results found.   Xiana Carns T. Johnattan Strassman [facility]  If 7PM-7AM, please contact night-coverage [email or web address] 03/09/2021, 1:50 PM

## 2021-03-10 DIAGNOSIS — I5032 Chronic diastolic (congestive) heart failure: Secondary | ICD-10-CM | POA: Diagnosis not present

## 2021-03-10 DIAGNOSIS — N179 Acute kidney failure, unspecified: Secondary | ICD-10-CM | POA: Diagnosis not present

## 2021-03-10 DIAGNOSIS — L03115 Cellulitis of right lower limb: Secondary | ICD-10-CM | POA: Diagnosis not present

## 2021-03-10 DIAGNOSIS — I482 Chronic atrial fibrillation, unspecified: Secondary | ICD-10-CM | POA: Diagnosis not present

## 2021-03-10 LAB — RENAL FUNCTION PANEL
Albumin: 2.9 g/dL — ABNORMAL LOW (ref 3.5–5.0)
Anion gap: 10 (ref 5–15)
BUN: 23 mg/dL (ref 8–23)
CO2: 29 mmol/L (ref 22–32)
Calcium: 9.4 mg/dL (ref 8.9–10.3)
Chloride: 100 mmol/L (ref 98–111)
Creatinine, Ser: 1.27 mg/dL — ABNORMAL HIGH (ref 0.44–1.00)
GFR, Estimated: 47 mL/min — ABNORMAL LOW (ref >60)
Glucose, Bld: 95 mg/dL (ref 70–99)
Phosphorus: 4.7 mg/dL — ABNORMAL HIGH (ref 2.5–4.6)
Potassium: 3.7 mmol/L (ref 3.5–5.1)
Sodium: 139 mmol/L (ref 135–145)

## 2021-03-10 LAB — HEMOGLOBIN AND HEMATOCRIT, BLOOD
HCT: 39.2 % (ref 36.0–46.0)
Hemoglobin: 12 g/dL (ref 12.0–15.0)

## 2021-03-10 LAB — GLUCOSE, CAPILLARY
Glucose-Capillary: 93 mg/dL (ref 70–99)
Glucose-Capillary: 135 mg/dL — ABNORMAL HIGH (ref 70–99)
Glucose-Capillary: 102 mg/dL — ABNORMAL HIGH (ref 70–99)
Glucose-Capillary: 134 mg/dL — ABNORMAL HIGH (ref 70–99)

## 2021-03-10 LAB — MAGNESIUM: Magnesium: 2.3 mg/dL (ref 1.7–2.4)

## 2021-03-10 MED ORDER — SPIRONOLACTONE 25 MG PO TABS
25.0000 mg | ORAL_TABLET | Freq: Every day | ORAL | Status: DC
  Administered 2021-03-10 – 2021-03-19 (×10): 25 mg via ORAL
  Filled 2021-03-10 (×10): qty 1

## 2021-03-10 MED ORDER — HYDROCERIN EX CREA
TOPICAL_CREAM | Freq: Two times a day (BID) | CUTANEOUS | Status: DC
  Administered 2021-03-11 – 2021-03-17 (×3): 1 via TOPICAL
  Filled 2021-03-10: qty 113

## 2021-03-10 NOTE — Progress Notes (Signed)
PROGRESS NOTE  Pamela [name] JYN:[id number] DOB: [birth year]   PCP: Corwin Levins, MD  Patient is from: Home.  Recently discharged from SNF to home.  She lives alone.  DOA: 02/08/2021 LOS: 26  Chief complaints:  Chief Complaint  Patient presents with   Shortness of Breath     Brief Narrative / Interim history: 65 year old F with PMH of permanent A. fib on Eliquis, diastolic CHF, CKD-3A, DM-2, GIB, HTN, HLD, anxiety, depression and recent COVID-19 infection presenting from home with RLE swelling and debility in the setting of knee and hip problem.  Patient was discharged to SNF after recent hospitalization from 7/11-12/2013 for GIB due to diverticulosis and a stercoral ulcer, and discharged home on 02/07/2021 as her insurance was not paying for further care.  She completed 10 days of antibiotic for possible cellulitis.  She remains physically deconditioned due to morbid obesity, chronic osteoarthritis and pain.  Therapy recommended inpatient rehab.   Subjective: Seen and examined earlier this morning.  No major events overnight of this morning.  Complaining dry itchy skin in her right wrist and right chest.  She has history of eczema.  She is getting Claritin which did not seems to help much.  She denies chest pain, dyspnea, GI or UTI symptoms.  Objective: Vitals:   03/10/21 0353 03/10/21 0439 03/10/21 0727 03/10/21 1115  BP: 107/62  100/66 (!) 106/59  Pulse: 99  86   Resp: 18  19 18   Temp: (!) 97.4 F (36.3 C)  98 F (36.7 C) 98.4 F (36.9 C)  TempSrc:   Oral Oral  SpO2: 90%  96% 95%  Weight:  (!) 150.6 kg    Height:        Intake/Output Summary (Last 24 hours) at 03/10/2021 1258 Last data filed at 03/10/2021 05/10/2021 Gross per 24 hour  Intake 717 ml  Output 3100 ml  Net -2383 ml   Filed Weights   03/08/21 0617 03/09/21 0648 03/10/21 0439  Weight: (!) 150.5 kg (!) 152.7 kg (!) 150.6 kg    Examination:  GENERAL: No apparent distress.  Nontoxic. HEENT: MMM.  Vision and hearing  grossly intact.  NECK: Supple.  No apparent JVD.  RESP: 95% on RA.  No IWOB.  Fair aeration bilaterally. CVS:  RRR. Heart sounds normal.  ABD/GI/GU: BS+. Abd soft, NTND.  MSK/EXT:  Moves extremities. No apparent deformity.  Trace BLE edema. SKIN: no apparent skin lesion or wound NEURO: Awake and alert. Oriented appropriately.  No apparent focal neuro deficit. PSYCH: Calm. Normal affect.   Procedures:  None  Microbiology summarized: COVID-19 and influenza PCR nonreactive.  Assessment & Plan: RLE cellulitis in the setting of chronic venous insufficiency/edema.   -completed 10 days of antibiotics.  -Discontinue Actos-causes fluid retention. -Continue Lasix and Jardiance -Tolerated low-dose Aldactone.  Increase to 25 mg daily -Leg elevation.  Chronic RLE DVT -Continue Eliquis.   Permanent A. fib: Rate controlled.  On high-dose Cardizem CD and Toprol-XL.  -Continue home Cardizem, Toprol-XL and Eliquis. -Would have been ideal to be off Cardizem but no great alternative   Generalized weakness/ambulatory dysfunction in the setting of morbid obesity and chronic b/l knee and right hip pain -See below about morbid obesity -Continue PT/OT-seems to be progressing. -Continue Tylenol as needed for pain    AKI/azotemia: Cr improved Recent Labs    02/20/21 1214 02/21/21 0822 02/26/21 0346 02/28/21 0418 03/02/21 0347 03/03/21 0251 03/04/21 0322 03/07/21 0509 03/09/21 1000 03/10/21 0413  BUN 17 16 16 18  22  21 22 21  24* 23  CREATININE 0.89 1.07* 1.13* 1.44* 1.38* 1.32* 1.56* 1.51* 1.34* 1.27*  -Continue monitoring intermittently  Refractory hypokalemia: K 3.7.  Likely due to Lasix.  -K-Dur 40 twice daily -Increase Aldactone to 25 mg daily -monitor closely over the next few days   Controlled DM-2 with hyperglycemia and neuropathy: A1c 7.9% (was 6.9% in 11/2020).  On Actos, glipizide, Jardiance and Lantus.  She also reports taking metformin which is not listed.  Recent Labs  Lab  03/09/21 1127 03/09/21 1604 03/09/21 2116 03/10/21 0617 03/10/21 1120  GLUCAP 90 111* 160* 93 135*  -Continue current insulin regimen plus home Jardiance. -Continue metformin 500 mg twice daily-May consider increasing to 1 g twice daily if renal function improves -Added Tradjenta 5 mg daily on 10/1 -Discontinued Actos in the setting of edema and CHF -Discontinued glipizide in the setting of morbid obesity -Consider adding GLP-1 agonist on discharge  -She might be able to come off insulin on the above regimen if CBG well-maintained -Continue home statin and Lyrica.  Eczema -Continue Claritin -Added Eucerin cream -Consider topical steroid if no improvement with the above   Morbid obesity: this with underlying osteoarthritis and chronic pain limiting patient's mobility Body mass index is 45.03 kg/m. -Encourage lifestyle change to lose weight-main focus is diet due to limited mobility -Discontinue Actos and glipizide.  Added metformin and Tradjenta instead. -Consider adding GLP-1 agonist in the setting of DM-2 Nutrition Problem: Increased nutrient needs Etiology: wound healing Signs/Symptoms: estimated needs Interventions: Premier Protein, MVI  Stage II right buttock decubitus Pressure Injury 02/09/21 Buttocks Right Stage 2 -  Partial thickness loss of dermis presenting as a shallow open injury with a red, pink wound bed without slough. (Active)  02/09/21 2059  Location: Buttocks  Location Orientation: Right  Staging: Stage 2 -  Partial thickness loss of dermis presenting as a shallow open injury with a red, pink wound bed without slough.  Wound Description (Comments):   Present on Admission: Yes   DVT prophylaxis:   apixaban (ELIQUIS) tablet 5 mg  Code Status: DNR/DNI Family Communication: Patient and/or RN. Available if any question.  Level of care: Telemetry Cardiac Status is: Inpatient  Remains inpatient appropriate because:Unsafe d/c plan  Dispo: The patient is  from: Home              Anticipated d/c is to:  Inpatient rehab              Patient currently is medically stable to d/c.   Difficult to place patient Yes       Consultants:  None   Sch Meds:  Scheduled Meds:  apixaban  5 mg Oral BID   atorvastatin  40 mg Oral Daily   diltiazem  360 mg Oral Daily   empagliflozin  25 mg Oral Daily   famotidine  20 mg Oral Daily   ferrous sulfate  325 mg Oral Q24H   furosemide  40 mg Oral BID   Gerhardt's butt cream   Topical BID   hydrocerin   Topical BID   insulin aspart  0-9 Units Subcutaneous TID WC   insulin aspart  3 Units Subcutaneous TID WC   linagliptin  5 mg Oral Daily   loratadine  10 mg Oral Daily   metFORMIN  500 mg Oral BID WC   metoprolol succinate  100 mg Oral Daily   multivitamin with minerals  1 tablet Oral Daily   potassium chloride  40 mEq Oral BID  pregabalin  50 mg Oral TID   Ensure Max Protein  11 oz Oral QHS   sodium chloride flush  3 mL Intravenous Q12H   spironolactone  25 mg Oral Daily   Continuous Infusions: PRN Meds:.acetaminophen **OR** acetaminophen, albuterol, loperamide, nystatin, ondansetron **OR** ondansetron (ZOFRAN) IV, senna-docusate  Antimicrobials: Anti-infectives (From admission, onward)    Start     Dose/Rate Route Frequency Ordered Stop   02/18/21 0000  doxycycline (VIBRA-TABS) 100 MG tablet  Status:  Discontinued        100 mg Oral Every 12 hours 02/18/21 1257 02/20/21    02/17/21 2200  cephALEXin (KEFLEX) capsule 500 mg        500 mg Oral Every 8 hours 02/17/21 1449 02/18/21 2218   02/16/21 1400  cephALEXin (KEFLEX) capsule 500 mg  Status:  Discontinued        500 mg Oral Every 8 hours 02/16/21 1200 02/17/21 1449   02/14/21 1000  doxycycline (VIBRA-TABS) tablet 100 mg        100 mg Oral Every 12 hours 02/14/21 0849 02/18/21 2217   02/13/21 1400  cephALEXin (KEFLEX) capsule 250 mg  Status:  Discontinued        250 mg Oral Every 8 hours 02/13/21 1005 02/16/21 1200   02/09/21 0030   ceFAZolin (ANCEF) IVPB 2g/100 mL premix  Status:  Discontinued        2 g 200 mL/hr over 30 Minutes Intravenous Every 8 hours 02/09/21 0016 02/13/21 1005        I have personally reviewed the following labs and images: CBC: Recent Labs  Lab 03/09/21 1000 03/10/21 0413  WBC 10.3  --   HGB 10.9* 12.0  HCT 36.9 39.2  MCV 83.3  --   PLT 280  --    BMP &GFR Recent Labs  Lab 03/04/21 0322 03/07/21 0509 03/09/21 1000 03/10/21 0413  NA 135 138 139 139  K 3.7 3.0* 3.3* 3.7  CL 97* 100 100 100  CO2 28 28 29 29   GLUCOSE 119* 79 107* 95  BUN 22 21 24* 23  CREATININE 1.56* 1.51* 1.34* 1.27*  CALCIUM 9.3 9.2 9.5 9.4  MG 2.3  --  2.1 2.3  PHOS 4.0  --  4.2 4.7*   Estimated Creatinine Clearance: 72.6 mL/min (A) (by C-G formula based on SCr of 1.27 mg/dL (H)). Liver & Pancreas: Recent Labs  Lab 03/04/21 0322 03/09/21 1000 03/10/21 0413  ALBUMIN 2.9* 2.9* 2.9*   No results for input(s): LIPASE, AMYLASE in the last 168 hours. No results for input(s): AMMONIA in the last 168 hours. Diabetic: No results for input(s): HGBA1C in the last 72 hours.  Recent Labs  Lab 03/09/21 1127 03/09/21 1604 03/09/21 2116 03/10/21 0617 03/10/21 1120  GLUCAP 90 111* 160* 93 135*   Cardiac Enzymes: No results for input(s): CKTOTAL, CKMB, CKMBINDEX, TROPONINI in the last 168 hours.  Recent Labs    03/20/20 1145 06/21/20 1129  PROBNP 125.0* 139.0*   Coagulation Profile: No results for input(s): INR, PROTIME in the last 168 hours. Thyroid Function Tests: No results for input(s): TSH, T4TOTAL, FREET4, T3FREE, THYROIDAB in the last 72 hours.  Lipid Profile: No results for input(s): CHOL, HDL, LDLCALC, TRIG, CHOLHDL, LDLDIRECT in the last 72 hours. Anemia Panel: No results for input(s): VITAMINB12, FOLATE, FERRITIN, TIBC, IRON, RETICCTPCT in the last 72 hours.  Urine analysis:    Component Value Date/Time   COLORURINE YELLOW 06/21/2020 1129   APPEARANCEUR CLEAR 06/21/2020 1129  LABSPEC 1.010 06/21/2020 1129   PHURINE 5.5 06/21/2020 1129   GLUCOSEU >=1000 (A) 06/21/2020 1129   HGBUR NEGATIVE 06/21/2020 1129   BILIRUBINUR NEGATIVE 06/21/2020 1129   KETONESUR NEGATIVE 06/21/2020 1129   UROBILINOGEN 0.2 06/21/2020 1129   NITRITE NEGATIVE 06/21/2020 1129   LEUKOCYTESUR NEGATIVE 06/21/2020 1129   Sepsis Labs: Invalid input(s): PROCALCITONIN, LACTICIDVEN  Microbiology: No results found for this or any previous visit (from the past 240 hour(s)).  Radiology Studies: No results found.   Pamela [name] T. Wana Mount [facility]  If 7PM-7AM, please contact night-coverage [email or web address] 03/10/2021, 12:58 PM

## 2021-03-10 NOTE — Plan of Care (Signed)

## 2021-03-11 DIAGNOSIS — L03115 Cellulitis of right lower limb: Secondary | ICD-10-CM | POA: Diagnosis not present

## 2021-03-11 LAB — RENAL FUNCTION PANEL
Albumin: 2.9 g/dL — ABNORMAL LOW (ref 3.5–5.0)
Anion gap: 10 (ref 5–15)
BUN: 25 mg/dL — ABNORMAL HIGH (ref 8–23)
CO2: 29 mmol/L (ref 22–32)
Calcium: 9.2 mg/dL (ref 8.9–10.3)
Chloride: 98 mmol/L (ref 98–111)
Creatinine, Ser: 1.65 mg/dL — ABNORMAL HIGH (ref 0.44–1.00)
GFR, Estimated: 34 mL/min — ABNORMAL LOW (ref >60)
Glucose, Bld: 117 mg/dL — ABNORMAL HIGH (ref 70–99)
Phosphorus: 5.1 mg/dL — ABNORMAL HIGH (ref 2.5–4.6)
Potassium: 3.7 mmol/L (ref 3.5–5.1)
Sodium: 137 mmol/L (ref 135–145)

## 2021-03-11 LAB — GLUCOSE, CAPILLARY
Glucose-Capillary: 95 mg/dL (ref 70–99)
Glucose-Capillary: 139 mg/dL — ABNORMAL HIGH (ref 70–99)
Glucose-Capillary: 147 mg/dL — ABNORMAL HIGH (ref 70–99)
Glucose-Capillary: 157 mg/dL — ABNORMAL HIGH (ref 70–99)
Glucose-Capillary: 162 mg/dL — ABNORMAL HIGH (ref 70–99)

## 2021-03-11 LAB — MAGNESIUM: Magnesium: 2.3 mg/dL (ref 1.7–2.4)

## 2021-03-11 NOTE — Progress Notes (Signed)
[facility] PROGRESS NOTE  Pamela [name] ZOX:[id number] DOB: January 29, 1956 DOA: 02/08/2021 PCP: Corwin Levins, MD  Status: Remains inpatient appropriate because:Unsafe d/c plan patient is unable to independently stand for more than 1 minute and we are continuing aggressive PT and OT   Dispo: The patient is from: Home              Anticipated d/c is to: [facility]-insurance denied additional inpatient rehabilitation after peer to peer on 9/30.  Patient becomes eligible for Medicare on 10/7.  Hopeful can utilize Medicare for inpatient rehabilitation              Patient currently is medically stable to d/c.   Difficult to place patient Yes              Barriers to discharge: Has utilized all insurance days for SNF rehab or acute rehab.  Will be eligible for Medicare next month and once obtains initial Medicare number IR will accept patient   Level of care: Telemetry Cardiac  Code Status: DNR Family Communication: Patient only DVT prophylaxis: Eliquis COVID vaccination status: Pfizer 01/24/2020, 04/03/2020.  Eligible for booster  HPI: 65 year old with past medical history significant for permanent A. fib on Eliquis, chronic diastolic heart failure, CKD stage IIIa, diabetes type 2, hypertension, hyperlipidemia, depression, anxiety presented to the ED for evaluation of right leg swelling. Recently hospitalized 12/17/2020 until 12/21/2020 for lower GI bleed related to diverticulosis and stercoral ulcer.  She was discharged to SNF.  Patient was sent home from skilled nursing facility on 02/07/2021 as her insurance will not pay for further days, patient continued to have knee and hip problems with generalized debility.  Also she did not get a prescription for Eliquis and had not taken it for 3 days.  On 9/1 she noticed increasing swelling and erythema to her right lower leg and presented for evaluation.   Subjective: Patient has filed an appeal with her insurance company regarding continued need  for inpatient rehabilitative services.  No issues with chest pain, shortness of breath, nausea or vomiting.  Objective: Vitals:   03/10/21 1959 03/11/21 0500  BP: 110/72 109/66  Pulse: 84 98  Resp: 18   Temp: 98.6 F (37 C) 97.7 F (36.5 C)  SpO2: 95% 95%    Intake/Output Summary (Last 24 hours) at 03/11/2021 0757 Last data filed at 03/11/2021 0500 Gross per 24 hour  Intake 360 ml  Output 2200 ml  Net -1840 ml   Filed Weights   03/09/21 0648 03/10/21 0439 03/11/21 0500  Weight: (!) 152.7 kg (!) 150.6 kg (!) 150.2 kg    Exam: General: No acute distress, calm Respiratory: Stable on room air, anterior lung sounds remain clear, no increased work of breathing, pulse oximetry 92 to 95% Cardiovascular: Heart sounds S1-S2, no murmurs, no peripheral edema, regular pulse, Abdomen: Soft and nontender with normoactive bowel sounds. LBM 10/01 Tolerating oral diet Neurologic: CN 2-12 grossly intact. Sensation intact, DTR normal. Strength 4/5 x all 4 extremities while in bed Marked decrease strength with ambulation.  Also has significant decreased upper extremity strength with activities at about 2-3/5 Psychiatric: Normal judgment and insight. Alert and oriented x 3.     Assessment/Plan: Acute problems: Cellulitis of the right lower extremity age indeterminate right leg DVT: Doppler on 02/09/2021, age indeterminate right popliteal vein thrombosis.   Has completed 10 days of antibiotics for cellulitis Continue Eliquis.   Permanent A. fib:  Eliquis, diltiazem and Toprol-rate controlled  Recurrent hypokalemia Over  the weekend Aldactone added to Lasix-continue potassium for now and follow labs frequently to monitor for hyperkalemia Potassium on 10/3 was 3.7  Recurrent seasonal allergies Symptoms have improved with combination BID Zyrtec with Pepcid x 3 days Continue the same medications daily   General debility, chronic bilateral knee pain, right hip pain: Awaiting insurance  authorization for [facility] inpatient rehabilitation Although while sitting in the bed strength is adequate with upper extremity strengthening somewhat less at about 4-5 once she stands her tolerance decreases therefore decreasing her strength.  Her upper extremity strength also is quite diminished with attempts to push up from the bed and is estimated to be about 2-3.  She is improving but continues to stand only for about 1 or 2 minutes.  Is a max assist and has been unable to ambulate even in room with a rolling walker.  Patient lives alone and would have no one to assist her.  She would be unable to obtain food, toilet or otherwise care for herself independently.  Even these findings she is unsafe to discharge home independent  Obesity Body mass index is 44.91 kg/m.  Degree of obesity in context of underlying osteoarthritis has contributed to patient's severe debility and profound physical deconditioning Will obtain nutrition consult for weight loss education  Mild renal insufficiency Peak creatinine 1.42 with a GFR of 41 on 9/5 and now back to baseline Baseline creatinine 0.89 through 1.24 with average GFR greater than 6 Stable monitor  Chronic lower extremity edema Continue Lasix Renal function stable thus far with resumption of Jardiance   Diabetic neuropathy:  Continue with Lyrica   Type II Diabetes:  Patient was on Jardiance, Actos and glipizide prior to admission but was not on insulin. Actos discontinued secondary to edema and history of heart failure. Metformin resumed Glipizide discontinued in favor of Tradjenta Continue SSI and meal coverage while hospitalized GFR has recovered-HgbA1c June 2022 was 6.9  Nutrition BMI elevated without any evidence of malnutrition Continue carbohydrate modified diet Body mass index is 44.91 kg/m.    Stage II right buttock decubitus Wound / Incision (Open or Dehisced) 02/09/21 Non-pressure wound Tibial Posterior;Proximal;Right Open blister  secondary to cellulitis (Active)  Date First Assessed/Time First Assessed: 02/09/21 1321   Wound Type: Non-pressure wound  Location: Tibial  Location Orientation: Posterior;Proximal;Right  Wound Description (Comments): Open blister secondary to cellulitis  Present on Admission: Yes    Assessments 02/09/2021  1:00 PM 03/09/2021  9:00 PM  Dressing Type Foam - Lift dressing to assess site every shift None  Dressing Changed New --  Dressing Status Dry;Intact;Clean --  Dressing Change Frequency PRN --  Site / Wound Assessment -- Dry;Pink  Wound Length (cm) 4 cm --  Wound Width (cm) 5 cm --  Wound Depth (cm) 0 cm --  Wound Volume (cm^3) 0 cm^3 --  Wound Surface Area (cm^2) 20 cm^2 --  Drainage Amount Minimal --  Drainage Description Sanguineous;No odor --     No Linked orders to display     Wound / Incision (Open or Dehisced) 02/09/21 (ITD) Intertriginous Dermatitis Buttocks Circumferential red and raised rash (Active)  Date First Assessed/Time First Assessed: 02/09/21 1323   Wound Type: (ITD) Intertriginous Dermatitis  Location: (c) Buttocks  Location Orientation: Circumferential  Wound Description (Comments): red and raised rash  Present on Admission: Yes    Assessments 02/09/2021  1:00 PM 03/09/2021  9:00 PM  Dressing Type Foam - Lift dressing to assess site every shift None  Dressing Changed New --  Dressing Status Dry;Intact;Clean --  Dressing Change Frequency PRN --  Site / Wound Assessment -- Dry;Pink  Peri-wound Assessment -- Intact;Pink  Drainage Amount None --  Treatment Cleansed Cleansed;Other (Comment)     No Linked orders to display     Pressure Injury 02/09/21 Buttocks Right Stage 2 -  Partial thickness loss of dermis presenting as a shallow open injury with a red, pink wound bed without slough. (Active)  Date First Assessed/Time First Assessed: 02/09/21 2059   Location: Buttocks  Location Orientation: Right  Staging: Stage 2 -  Partial thickness loss of dermis presenting as a  shallow open injury with a red, pink wound bed without slough.  Present on A...    Assessments 02/09/2021  8:59 PM 03/09/2021  8:30 AM  Dressing Type Foam - Lift dressing to assess site every shift None  Treatment -- Cleansed     No Linked orders to display      Data Reviewed: Basic Metabolic Panel: Recent Labs  Lab 03/07/21 0509 03/09/21 1000 03/10/21 0413 03/11/21 0248  NA 138 139 139 137  K 3.0* 3.3* 3.7 3.7  CL 100 100 100 98  CO2 28 29 29 29   GLUCOSE 79 107* 95 117*  BUN 21 24* 23 25*  CREATININE 1.51* 1.34* 1.27* 1.65*  CALCIUM 9.2 9.5 9.4 9.2  MG  --  2.1 2.3 2.3  PHOS  --  4.2 4.7* 5.1*    CBC: Recent Labs  Lab 03/09/21 1000 03/10/21 0413  WBC 10.3  --   HGB 10.9* 12.0  HCT 36.9 39.2  MCV 83.3  --   PLT 280  --     Cardiac Enzymes: No results for input(s): CKTOTAL, CKMB, CKMBINDEX, TROPONINI in the last 168 hours.  BNP (last 3 results) Recent Labs    02/08/21 2113 03/03/21 0251  BNP 210.6* 92.4    ProBNP (last 3 results) Recent Labs    03/20/20 1145 06/21/20 1129  PROBNP 125.0* 139.0*    CBG: Recent Labs  Lab 03/10/21 0617 03/10/21 1120 03/10/21 1657 03/10/21 2053 03/11/21 0532  GLUCAP 93 135* 102* 134* 139*     Scheduled Meds:  apixaban  5 mg Oral BID   atorvastatin  40 mg Oral Daily   diltiazem  360 mg Oral Daily   empagliflozin  25 mg Oral Daily   famotidine  20 mg Oral Daily   ferrous sulfate  325 mg Oral Q24H   furosemide  40 mg Oral BID   Gerhardt's butt cream   Topical BID   hydrocerin   Topical BID   insulin aspart  0-9 Units Subcutaneous TID WC   insulin aspart  3 Units Subcutaneous TID WC   linagliptin  5 mg Oral Daily   loratadine  10 mg Oral Daily   metFORMIN  500 mg Oral BID WC   metoprolol succinate  100 mg Oral Daily   multivitamin with minerals  1 tablet Oral Daily   potassium chloride  40 mEq Oral BID   pregabalin  50 mg Oral TID   Ensure Max Protein  11 oz Oral QHS   sodium chloride flush  3 mL  Intravenous Q12H   spironolactone  25 mg Oral Daily    Principal Problem:   Cellulitis of right lower extremity Active Problems:   Type 2 diabetes mellitus (HCC)   Hypertension associated with diabetes (HCC)   CKD (chronic kidney disease) stage 3, GFR 30-59 ml/min (HCC)   Chronic atrial fibrillation (HCC)   Chronic  diastolic heart failure (HCC)   Hyperlipidemia associated with type 2 diabetes mellitus (HCC)   Pressure injury of skin   Consultants: None  Procedures: None  Antibiotics: IV cefazolin 9/2 through 9/6 PO cephalexin 9/7 through 9/12 PO doxycycline 9/8 through 9/12  Time spent: 15 minutes    Junious Silk ANP  [facility] 7 am - 330 pm/M-F for direct patient care and secure chat Please refer to Amion for contact info 27  days

## 2021-03-11 NOTE — Progress Notes (Signed)
Occupational Therapy Treatment Patient Details Name: Pamela [name] MRN: [id number] DOB: 05-Jun-1956 Today's Date: 03/11/2021   History of present illness Pt is a 65 y.o. female admitted 02/08/21 with RLE swelling and erythema. Workup for RLE cellulitis, age-indeterminate RLE DVT. CXR with cardiomegaly, vascular congestion. PMH includes anxiety, depression, afib, CHF, DM2, HTN, rosacea, migraines, R knee DJD. Of note, admission 7/11-7/15/22 for GIB with discharge to SNF with recent return home 02/07/21.   OT comments  Focus of session on lateral transfers bed>w/c>drop arm recliner. From high to lower surface requires min guard assist, from equal height needed +2 mod assist. Pt able to self propel w/c out of room and performed grooming at sink at w/c level. Goals updated. Pt remains hopeful for inpatient rehab.    Recommendations for follow up therapy are one component of a multi-disciplinary discharge planning process, led by the attending physician.  Recommendations may be updated based on patient status, additional functional criteria and insurance authorization.    Follow Up Recommendations   (Inpatient Rehab)    Equipment Recommendations  Wheelchair (measurements OT);Wheelchair cushion (measurements OT) (drop arm commode)    Recommendations for Other Services      Precautions / Restrictions Precautions Precautions: Fall;Other (comment) Precaution Comments: Bladder/bowel incontinence Restrictions Weight Bearing Restrictions: No       Mobility Bed Mobility Overal bed mobility: Needs Assistance Bed Mobility: Supine to Sit Rolling: Supervision   Supine to sit: Supervision;HOB elevated     General bed mobility comments: supervision for bed mobility, able to come to longsitting to don socks before coming to sitting EoB    Transfers Overall transfer level: Needs assistance   Transfers: Lateral/Scoot Transfers          Lateral/Scoot Transfers: +2 safety/equipment;From elevated  surface;Min guard;Mod assist;+2 physical assistance General transfer comment: min guard for downhill transfer from bed to wheelchair, requires modAx2 for transfer from lower wheelchair to recliner, requires increased cuing for forward lean to offweight hips to scoot over wheelchair wheel, requires physical assist to move LE for more efficient scoot    Balance Overall balance assessment: Needs assistance Sitting-balance support: No upper extremity supported;Feet supported Sitting balance-[name]: Good                                     ADL either performed or assessed with clinical judgement   ADL Overall ADL's : Needs assistance/impaired     Grooming: Oral care;Sitting;Set up Grooming Details (indicate cue type and reason): at sink in w/c             Lower Body Dressing: Set up;Bed level Lower Body Dressing Details (indicate cue type and reason): donned socks in long sitting                     Vision       Perception     Praxis      Cognition Arousal/Alertness: Awake/alert Behavior During Therapy: Anxious Overall Cognitive Status: Within Functional Limits for tasks assessed                                 General Comments: pleasant and agreeable to participate in session. Very motivated.        Exercises     Shoulder Instructions       General Comments Pt reports she will call [facility]  again today for appeal to go to inpatient rehab.    Pertinent Vitals/ Pain       Pain Assessment: Faces Faces Pain Scale: Hurts a little bit Pain Location: bilat knee R>L (arthritic, chronic), during transfers Pain Descriptors / Indicators: Sore;Discomfort Pain Intervention(s): Monitored during session;Repositioned  Home Living                                          Prior Functioning/Environment              Frequency  Min 2X/week        Progress Toward Goals  OT Goals(current goals can now be found  in the care plan section)  Progress towards OT goals: Progressing toward goals  Acute Rehab OT Goals Patient Stated Goal: to stand better and go to inpt rehab OT Goal Formulation: With patient Time For Goal Achievement: 03/25/21 Potential to Achieve Goals: Good  Plan Discharge plan remains appropriate    Co-evaluation                 AM-PAC OT "6 Clicks" Daily Activity     Outcome Measure   Help from another person eating meals?: None Help from another person taking care of personal grooming?: A Little Help from another person toileting, which includes using toliet, bedpan, or urinal?: A Lot Help from another person bathing (including washing, rinsing, drying)?: A Lot Help from another person to put on and taking off regular upper body clothing?: A Little Help from another person to put on and taking off regular lower body clothing?: A Little 6 Click Score: 17    End of Session    OT Visit Diagnosis: Muscle weakness (generalized) (M62.81)   Activity Tolerance Patient tolerated treatment well   Patient Left in chair;with call bell/phone within reach;with chair alarm set   Nurse Communication          Time: 5427-0623 OT Time Calculation (min): 48 min  Charges: OT General Charges $OT Visit: 1 Visit OT Treatments $Self Care/Home Management : 8-22 mins  Martie Round, OTR/L Acute Rehabilitation Services Pager: [phone] Office: [phone]  Evern Bio 03/11/2021, 12:48 PM

## 2021-03-11 NOTE — Progress Notes (Addendum)
Physical Therapy Treatment Patient Details Name: Pamela [name] MRN: [id number] DOB: Sep 26, 1955 Today's Date: 03/11/2021   History of Present Illness Pt is a 65 y.o. female admitted 02/08/21 with RLE swelling and erythema. Workup for RLE cellulitis, age-indeterminate RLE DVT. CXR with cardiomegaly, vascular congestion. PMH includes anxiety, depression, afib, CHF, DM2, HTN, rosacea, migraines, R knee DJD. Of note, admission 7/11-7/15/22 for GIB with discharge to SNF with recent return home 02/07/21.    PT Comments    Pt agreeable to try wheelchair transfer today to improve overall mobility. Pt continues to exhibit strong UE and core muscles, but continues to be limited in mobility by decreased LE strength and pain in bilateral knees. Pt is supervision for bed mobility, and min guard for transfer from higher bed to lower wheelchair. Once in wheelchair pt provided safety information and was able to propel herself in hallway with supervision. Pt requires modAx2 with pad assist to transfer from lower wheelchair surface to higher recliner surface. Pt continues to be very motivated to return to independence, and would benefit from continued rehab in inpatient rehab facility given the increased intensity. PT will continues follow acutely.      Recommendations for follow up therapy are one component of a multi-disciplinary discharge planning process, led by the attending physician.  Recommendations may be updated based on patient status, additional functional criteria and insurance authorization.  Follow Up Recommendations  CIR;Supervision for mobility/OOB (IPR)     Equipment Recommendations  Other (comment) (tbd next venue)       Precautions / Restrictions Precautions Precautions: Fall;Other (comment) Precaution Comments: Bladder/bowel incontinence Restrictions Weight Bearing Restrictions: No     Mobility  Bed Mobility Overal bed mobility: Needs Assistance Bed Mobility: Supine to Sit Rolling:  Supervision   Supine to sit: Supervision;HOB elevated     General bed mobility comments: supervision for bed mobility, able to come to longsitting to don socks before coming to sitting EoB    Transfers Overall transfer level: Needs assistance   Transfers: Lateral/Scoot Transfers          Lateral/Scoot Transfers: +2 safety/equipment;From elevated surface;Min guard;Mod assist;+2 physical assistance General transfer comment: min guard for downhill transfer from bed to wheelchair, requires modAx2 for transfer from lower wheelchair to recliner, requires increased cuing for forward lean to offweight hips to scoot over wheelchair wheel, requires physical assist to move LE for more efficient scoot  Ambulation/Gait             General Gait Details: unable       Wheelchair Mobility Wheelchair Mobility Wheelchair mobility: Yes Wheelchair propulsion: Both upper extremities Wheelchair parts: Supervision/cueing Distance: 100 Wheelchair Assistance Details (indicate cue type and reason): educated on use of brakes for transfers and for tight radius turning, pt with very good navigation around obstacles in hallway and in room      Balance Overall balance assessment: Needs assistance Sitting-balance support: No upper extremity supported;Feet supported Sitting balance-[name]: Good                                      Cognition Arousal/Alertness: Awake/alert Behavior During Therapy: Anxious Overall Cognitive Status: Within Functional Limits for tasks assessed                                 General Comments: pleasant and agreeable to participate in session.  Very motivated.         General Comments General comments (skin integrity, edema, etc.): Pt reports she will call Pamela [name] again today for appeal to go to inpatient rehab.      Pertinent Vitals/Pain Pain Assessment: Faces Faces Pain Scale: Hurts a little bit Pain Location: bilat knee R>L  (arthritic, chronic), during transfers Pain Descriptors / Indicators: Sore;Discomfort Pain Intervention(s): Monitored during session;Repositioned     PT Goals (current goals can now be found in the care plan section) Acute Rehab PT Goals Patient Stated Goal: to stand better and go to inpt rehab PT Goal Formulation: With patient Time For Goal Achievement: 03/25/21 Potential to Achieve Goals: Fair Additional Goals Additional Goal #1: Pt will be able to transfer to wheelchair with min guard and propell herself 500 feet. Progress towards PT goals: Progressing toward goals    Frequency    Min 3X/week      PT Plan Current plan remains appropriate       AM-PAC PT "6 Clicks" Mobility   Outcome Measure  Help needed turning from your back to your side while in a flat bed without using bedrails?: A Little Help needed moving from lying on your back to sitting on the side of a flat bed without using bedrails?: A Little Help needed moving to and from a bed to a chair (including a wheelchair)?: A Little Help needed standing up from a chair using your arms (e.g., wheelchair or bedside chair)?: Total Help needed to walk in hospital room?: Total Help needed climbing 3-5 steps with a railing? : Total 6 Click Score: 12    End of Session   Activity Tolerance: Patient tolerated treatment well Patient left: with call bell/phone within reach;in chair;with chair alarm set Nurse Communication: Mobility status;Other (comment) (need for Purewick) PT Visit Diagnosis: Unsteadiness on feet (R26.81);Muscle weakness (generalized) (M62.81);Difficulty in walking, not elsewhere classified (R26.2) Pain - Right/Left: Right Pain - part of body: Leg     Time: 1110-1157 PT Time Calculation (min) (ACUTE ONLY): 47 min  Charges:  $Wheel Chair Management: 8-22 mins                     Nelwyn Hebdon B. Beverely Risen PT, DPT Acute Rehabilitation Services Pager [phone] Office [phone]    Elon Alas Fleet 03/11/2021, 1:02 PM

## 2021-03-11 NOTE — Progress Notes (Signed)
CSW spoke with patient who confirms she completed her appeal to [facility] on Friday 03/08/21. Patient returned call to [place] today and she was told the insurance company needs a reason for medical necessity for her to go to rehab. Patient was told she has utilized all of her rehab days, including SNF and inpatient rehab. Patient states she spoke with [employer] who states her Medicare benefits will be active 24-48 hours following her appointment on 10/7.  CSW spoke with Verdon Cummins at [facility] who will speak with her supervisor regarding [employer] denial.  Edwin Dada, MSW, LCSW Transitions of Care  Clinical Social Worker II [phone]

## 2021-03-12 DIAGNOSIS — L03115 Cellulitis of right lower limb: Secondary | ICD-10-CM | POA: Diagnosis not present

## 2021-03-12 LAB — GLUCOSE, CAPILLARY
Glucose-Capillary: 129 mg/dL — ABNORMAL HIGH (ref 70–99)
Glucose-Capillary: 142 mg/dL — ABNORMAL HIGH (ref 70–99)
Glucose-Capillary: 155 mg/dL — ABNORMAL HIGH (ref 70–99)
Glucose-Capillary: 158 mg/dL — ABNORMAL HIGH (ref 70–99)

## 2021-03-12 MED ORDER — INSULIN ASPART 100 UNIT/ML IJ SOLN
0.0000 [IU] | Freq: Three times a day (TID) | INTRAMUSCULAR | Status: DC
  Administered 2021-03-12 (×2): 1 [IU] via SUBCUTANEOUS
  Administered 2021-03-12: 2 [IU] via SUBCUTANEOUS
  Administered 2021-03-13 – 2021-03-14 (×6): 1 [IU] via SUBCUTANEOUS
  Administered 2021-03-15: 2 [IU] via SUBCUTANEOUS
  Administered 2021-03-15: 1 [IU] via SUBCUTANEOUS
  Administered 2021-03-15: 2 [IU] via SUBCUTANEOUS
  Administered 2021-03-16 (×3): 1 [IU] via SUBCUTANEOUS
  Administered 2021-03-17 – 2021-03-19 (×7): 2 [IU] via SUBCUTANEOUS

## 2021-03-12 NOTE — Discharge Instructions (Addendum)

## 2021-03-12 NOTE — Progress Notes (Signed)
[facility] PROGRESS NOTE  Pamela [name] TIW:[id number] DOB: [birth year] DOA: 02/08/2021 PCP: Corwin Levins, MD  Status: Remains inpatient appropriate because:Unsafe d/c plan patient is unable to independently stand for more than 1 minute and we are continuing aggressive PT and OT   Dispo: The patient is from: Home              Anticipated d/c is to: [facility]-insurance denied additional inpatient rehabilitation after peer to peer on 9/30.  Patient becomes eligible for Medicare on 10/7.  Hopeful can utilize Medicare for inpatient rehabilitation              Patient currently is medically stable to d/c.   Difficult to place patient Yes              Barriers to discharge: Has utilized all insurance days for SNF rehab or acute rehab.  Will be eligible for Medicare next month and once obtains initial Medicare number IR will accept patient   Level of care: Telemetry Cardiac  Code Status: DNR Family Communication: Patient only DVT prophylaxis: Eliquis COVID vaccination status: Pfizer 01/24/2020, 04/03/2020.  Eligible for booster  HPI: 65 year old with past medical history significant for permanent A. fib on Eliquis, chronic diastolic heart failure, CKD stage IIIa, diabetes type 2, hypertension, hyperlipidemia, depression, anxiety presented to the ED for evaluation of right leg swelling. Recently hospitalized 12/17/2020 until 12/21/2020 for lower GI bleed related to diverticulosis and stercoral ulcer.  She was discharged to SNF.  Patient was sent home from skilled nursing facility on 02/07/2021 as her insurance will not pay for further days, patient continued to have knee and hip problems with generalized debility.  Also she did not get a prescription for Eliquis and had not taken it for 3 days.  On 9/1 she noticed increasing swelling and erythema to her right lower leg and presented for evaluation.   Subjective: Remains frustrated over denial of insurance coverage for inpatient rehab or  other facility-based rehabilitation options  Objective: Vitals:   03/11/21 1936 03/12/21 0544  BP: 105/79 113/69  Pulse: 78 (!) 101  Resp: 18 18  Temp: 97.9 F (36.6 C) 98.5 F (36.9 C)  SpO2: 94% (!) 88%    Intake/Output Summary (Last 24 hours) at 03/12/2021 0814 Last data filed at 03/12/2021 0544 Gross per 24 hour  Intake 1767 ml  Output 3050 ml  Net -1283 ml   Filed Weights   03/10/21 0439 03/11/21 0500 03/12/21 0544  Weight: (!) 150.6 kg (!) 150.2 kg (!) 149.5 kg    Exam: General: No acute distress, calm Respiratory: Room air, lung sounds remain clear, no increased work of breathing, pulse oximetry 95% Cardiovascular: S1-S2, no murmurs, no peripheral edema, regular pulse, normotensive Abdomen: Soft and nontender with normoactive bowel sounds. LBM 10/01 Tolerating oral diet Neurologic: CN 2-12 grossly intact. Sensation intact, DTR normal. Strength 4/5 x all 4 extremities while in bed Marked decrease strength with ambulation.  Also has significant decreased upper extremity strength with activities at about 2-3/5 Psychiatric: Normal judgment/insight. Alert and oriented x 3.     Assessment/Plan: Acute problems: Cellulitis of the right lower extremity age indeterminate right leg DVT: Doppler on 02/09/2021, age indeterminate right popliteal vein thrombosis.   Has completed 10 days of antibiotics for cellulitis Continue Eliquis.   Permanent A. fib:  Eliquis, diltiazem and Toprol-rate controlled  Recurrent hypokalemia Over the weekend Aldactone added to Lasix-continue potassium for now and follow labs frequently to monitor for hyperkalemia Potassium on  10/3 was 3.7  Recurrent seasonal allergies Symptoms have improved with combination BID Zyrtec with Pepcid x 3 days Continue the same medications daily   General debility, chronic bilateral knee pain, right hip pain: Peer-to-peer completed on 9/30 was denied by the physician reviewer with primary reason lack of improvement  recently as well as over the past 4 months therefore it is believed she will not return to previous baseline and they will offer no more coverage for physical debility/rehab services Patient continues to make small improvements in physical abilities noting she is able to scoot from the bed to her wheelchair and wheeled around the unit but is unable to stand independently or maintain a standing position. Patient lives alone and would have no one to assist her 24/7 although she does have 3 brothers who can assist partially. She continues to remain unsafe to discharge home independently.  Patient also reports that doors in her home are very small and during previous admission when she was utilizing a wheelchair she was unable to get into the bathroom or the kitchen.  Obesity Body mass index is 44.7 kg/m.  Degree of obesity in context of underlying osteoarthritis has contributed to patient's severe debility and profound physical deconditioning Will obtain nutrition consult for weight loss education  Mild renal insufficiency Peak creatinine 1.42 with a GFR of 41 on 9/5 and now back to baseline Baseline creatinine 0.89 through 1.24 with average GFR greater than 6 Slight increase in serum creatinine to 1.65 after the addition of Aldactone therefore we will check electrolytes in a.m. 10/5  Chronic lower extremity edema Continue Lasix Renal function stable thus far with resumption of Jardiance   Diabetic neuropathy:  Continue with Lyrica   Type II Diabetes:  Patient was on Jardiance, Actos and glipizide prior to admission but was not on insulin. Actos discontinued secondary to edema and history of heart failure. Metformin resumed Glipizide discontinued in favor of Tradjenta Continue SSI and meal coverage while hospitalized GFR has recovered-HgbA1c June 2022 was 6.9  Nutrition BMI elevated without any evidence of malnutrition Continue carbohydrate modified diet Body mass index is 44.7 kg/m.     Stage II right buttock decubitus Wound / Incision (Open or Dehisced) 02/09/21 Non-pressure wound Tibial Posterior;Proximal;Right Open blister secondary to cellulitis (Active)  Date First Assessed/Time First Assessed: 02/09/21 1321   Wound Type: Non-pressure wound  Location: Tibial  Location Orientation: Posterior;Proximal;Right  Wound Description (Comments): Open blister secondary to cellulitis  Present on Admission: Yes    Assessments 02/09/2021  1:00 PM 03/09/2021  9:00 PM  Dressing Type Foam - Lift dressing to assess site every shift None  Dressing Changed New --  Dressing Status Dry;Intact;Clean --  Dressing Change Frequency PRN --  Site / Wound Assessment -- Dry;Pink  Wound Length (cm) 4 cm --  Wound Width (cm) 5 cm --  Wound Depth (cm) 0 cm --  Wound Volume (cm^3) 0 cm^3 --  Wound Surface Area (cm^2) 20 cm^2 --  Drainage Amount Minimal --  Drainage Description Sanguineous;No odor --     No Linked orders to display     Wound / Incision (Open or Dehisced) 02/09/21 (ITD) Intertriginous Dermatitis Buttocks Circumferential red and raised rash (Active)  Date First Assessed/Time First Assessed: 02/09/21 1323   Wound Type: (ITD) Intertriginous Dermatitis  Location: (c) Buttocks  Location Orientation: Circumferential  Wound Description (Comments): red and raised rash  Present on Admission: Yes    Assessments 02/09/2021  1:00 PM 03/09/2021  9:00 PM  Dressing Type Foam - Lift dressing to assess site every shift None  Dressing Changed New --  Dressing Status Dry;Intact;Clean --  Dressing Change Frequency PRN --  Site / Wound Assessment -- Dry;Pink  Peri-wound Assessment -- Intact;Pink  Drainage Amount None --  Treatment Cleansed Cleansed;Other (Comment)     No Linked orders to display     Pressure Injury 02/09/21 Buttocks Right Stage 2 -  Partial thickness loss of dermis presenting as a shallow open injury with a red, pink wound bed without slough. (Active)  Date First Assessed/Time First  Assessed: 02/09/21 2059   Location: Buttocks  Location Orientation: Right  Staging: Stage 2 -  Partial thickness loss of dermis presenting as a shallow open injury with a red, pink wound bed without slough.  Present on A...    Assessments 02/09/2021  8:59 PM 03/11/2021  9:30 AM  Dressing Type Foam - Lift dressing to assess site every shift None  Dressing -- Clean  Dressing Change Frequency -- PRN     No Linked orders to display      Data Reviewed: Basic Metabolic Panel: Recent Labs  Lab 03/07/21 0509 03/09/21 1000 03/10/21 0413 03/11/21 0248  NA 138 139 139 137  K 3.0* 3.3* 3.7 3.7  CL 100 100 100 98  CO2 28 29 29 29   GLUCOSE 79 107* 95 117*  BUN 21 24* 23 25*  CREATININE 1.51* 1.34* 1.27* 1.65*  CALCIUM 9.2 9.5 9.4 9.2  MG  --  2.1 2.3 2.3  PHOS  --  4.2 4.7* 5.1*    CBC: Recent Labs  Lab 03/09/21 1000 03/10/21 0413  WBC 10.3  --   HGB 10.9* 12.0  HCT 36.9 39.2  MCV 83.3  --   PLT 280  --      BNP (last 3 results) Recent Labs    02/08/21 2113 03/03/21 0251  BNP 210.6* 92.4    ProBNP (last 3 results) Recent Labs    03/20/20 1145 06/21/20 1129  PROBNP 125.0* 139.0*    CBG: Recent Labs  Lab 03/11/21 0532 03/11/21 1205 03/11/21 1611 03/11/21 2049 03/12/21 0622  GLUCAP 139* 147* 157* 162* 129*     Scheduled Meds:  apixaban  5 mg Oral BID   atorvastatin  40 mg Oral Daily   diltiazem  360 mg Oral Daily   empagliflozin  25 mg Oral Daily   famotidine  20 mg Oral Daily   ferrous sulfate  325 mg Oral Q24H   furosemide  40 mg Oral BID   Gerhardt's butt cream   Topical BID   hydrocerin   Topical BID   insulin aspart  0-9 Units Subcutaneous TID WC   insulin aspart  3 Units Subcutaneous TID WC   linagliptin  5 mg Oral Daily   loratadine  10 mg Oral Daily   metFORMIN  500 mg Oral BID WC   metoprolol succinate  100 mg Oral Daily   multivitamin with minerals  1 tablet Oral Daily   potassium chloride  40 mEq Oral BID   pregabalin  50 mg Oral TID    Ensure Max Protein  11 oz Oral QHS   sodium chloride flush  3 mL Intravenous Q12H   spironolactone  25 mg Oral Daily    Principal Problem:   Cellulitis of right lower extremity Active Problems:   Type 2 diabetes mellitus (HCC)   Hypertension associated with diabetes (HCC)   CKD (chronic kidney disease) stage 3, GFR 30-59 ml/min (HCC)  Chronic atrial fibrillation (HCC)   Chronic diastolic heart failure (HCC)   Hyperlipidemia associated with type 2 diabetes mellitus (HCC)   Pressure injury of skin   Consultants: None  Procedures: None  Antibiotics: IV cefazolin 9/2 through 9/6 PO cephalexin 9/7 through 9/12 PO doxycycline 9/8 through 9/12  Time spent: 15 minutes    Junious Silk ANP  [facility] 7 am - 330 pm/M-F for direct patient care and secure chat Please refer to Amion for contact info 28  days

## 2021-03-13 DIAGNOSIS — L03115 Cellulitis of right lower limb: Secondary | ICD-10-CM | POA: Diagnosis not present

## 2021-03-13 LAB — BASIC METABOLIC PANEL
Anion gap: 11 (ref 5–15)
BUN: 30 mg/dL — ABNORMAL HIGH (ref 8–23)
CO2: 27 mmol/L (ref 22–32)
Calcium: 9.4 mg/dL (ref 8.9–10.3)
Chloride: 99 mmol/L (ref 98–111)
Creatinine, Ser: 1.43 mg/dL — ABNORMAL HIGH (ref 0.44–1.00)
GFR, Estimated: 41 mL/min — ABNORMAL LOW (ref >60)
Glucose, Bld: 129 mg/dL — ABNORMAL HIGH (ref 70–99)
Potassium: 4 mmol/L (ref 3.5–5.1)
Sodium: 137 mmol/L (ref 135–145)

## 2021-03-13 LAB — GLUCOSE, CAPILLARY
Glucose-Capillary: 125 mg/dL — ABNORMAL HIGH (ref 70–99)
Glucose-Capillary: 123 mg/dL — ABNORMAL HIGH (ref 70–99)
Glucose-Capillary: 149 mg/dL — ABNORMAL HIGH (ref 70–99)
Glucose-Capillary: 149 mg/dL — ABNORMAL HIGH (ref 70–99)

## 2021-03-13 NOTE — Progress Notes (Signed)
Nutrition Follow-up  DOCUMENTATION CODES:   Morbid obesity  INTERVENTION:   -Continue MVI with minerals daily -Continue Ensure Max po BID, each supplement provides 150 kcal and 30 grams of protein  NUTRITION DIAGNOSIS:   Increased nutrient needs related to wound healing as evidenced by estimated needs.  Ongoing  GOAL:   Patient will meet greater than or equal to 90% of their needs  Progressing   MONITOR:   PO intake, Supplement acceptance, Diet advancement, Labs, Weight trends, Skin, I & O's  REASON FOR ASSESSMENT:   Consult Assessment of nutrition requirement/status  ASSESSMENT:   65 year old with past medical history significant for permanent A. fib on Eliquis, chronic diastolic heart failure, CKD stage IIIa, diabetes type 2, hypertension, hyperlipidemia, depression, anxiety presented to the ED for evaluation of right leg swelling.  Reviewed I/O's: -1.7 L x 24 hours and -22.1 L since 02/27/21  UOP: 2.2 L x 24 hours  Pt sleeping soundly at time of visit. Noted meal completion 75-100%. Pt is also taking Ensure Max supplements.   Obesity is a complex, chronic medical condition that is optimally managed by a multidisciplinary care team. Weight loss is not an ideal goal for an acute inpatient hospitalization. However, if further work-up for obesity is warranted, consider outpatient referral to outpatient bariatric service and/or [facility].     Pt also with increased nutritional needs secondary to acute illness as well as wound healing. Optimizing pt's nutritional status to target wound healing and preservation of lean body mass will assist with pt's debility; RD to continue low carbohydrate, high protein nutritional supplements to help pt meet her nutritional goals.   Medications reviewed and include ferrous sulfate, cardizem, lasix, and aldactone.   Labs reviewed: CBGS: 125-158 (inpatient orders for glycemic control are 25 mg  jardiance daily, 0-9 units insulin aspart TID with meals, 3 units insulin aspart TID with meals, 500 mg metformin BID, and 5 mg linagliptin daily).    Diet Order:   Diet Order             Diet heart healthy/carb modified Room service appropriate? Yes; Fluid consistency: Thin; Fluid restriction: 1500 mL Fluid  Diet effective now           Diet - low sodium heart healthy                   EDUCATION NEEDS:   Education needs have been addressed  Skin:  Skin Assessment: Skin Integrity Issues: Skin Integrity Issues:: Stage II, Other (Comment) Stage II: rt buttocks Other: ITD buttocks, non-pressure wound to rt tibia  Last BM:  03/10/21  Height:   Ht Readings from Last 1 Encounters:  02/09/21 6' (1.829 m)    Weight:   Wt Readings from Last 1 Encounters:  03/13/21 (!) 150.4 kg    Ideal Body Weight:  72.7 kg  BMI:  Body mass index is 44.97 kg/m.  Estimated Nutritional Needs:   Kcal:  2200-2400  Protein:  130-145 grams  Fluid:  > 2 L    Levada Schilling, RD, LDN, CDCES Registered Dietitian II Certified Diabetes Care and Education Specialist Please refer to [facility] for RD and/or RD on-call/weekend/after hours pager

## 2021-03-13 NOTE — Progress Notes (Signed)
[facility] PROGRESS NOTE  Pamela [name] TGG:[id number] DOB: 07-10-1955 DOA: 02/08/2021 PCP: Corwin Levins, MD  Status: Remains inpatient appropriate because:Unsafe d/c plan patient is unable to independently stand for more than 1 minute and we are continuing aggressive PT and OT   Dispo: The patient is from: Home              Anticipated d/c is to: [facility]-insurance denied additional inpatient rehabilitation after peer to peer on 9/30.  Patient becomes eligible for Medicare on 10/7.  Hopeful can utilize Medicare for inpatient rehabilitation              Patient currently is medically stable to d/c.   Difficult to place patient Yes              Barriers to discharge: Has utilized all insurance days for SNF rehab or acute rehab.  Will be eligible for Medicare next month and once obtains initial Medicare number IR will accept patient   Level of care: Telemetry Cardiac  Code Status: DNR Family Communication: Patient only DVT prophylaxis: Eliquis COVID vaccination status: Pfizer 01/24/2020, 04/03/2020.  Eligible for booster  HPI: 65 year old with past medical history significant for permanent A. fib on Eliquis, chronic diastolic heart failure, CKD stage IIIa, diabetes type 2, hypertension, hyperlipidemia, depression, anxiety presented to the ED for evaluation of right leg swelling. Recently hospitalized 12/17/2020 until 12/21/2020 for lower GI bleed related to diverticulosis and stercoral ulcer.  She was discharged to SNF.  Patient was sent home from skilled nursing facility on 02/07/2021 as her insurance will not pay for further days, patient continued to have knee and hip problems with generalized debility.  Also she did not get a prescription for Eliquis and had not taken it for 3 days.  On 9/1 she noticed increasing swelling and erythema to her right lower leg and presented for evaluation.   Subjective: Alert.  Very hopeful that Medicare will be rapidly activated as quickly as the  representative told her so she can transition to [facility] inpatient rehabilitation  Objective: Vitals:   03/12/21 2101 03/13/21 0411  BP: 105/70 112/75  Pulse: 93 (!) 102  Resp: 18 20  Temp: 97.9 F (36.6 C) 98.1 F (36.7 C)  SpO2: 94% 90%    Intake/Output Summary (Last 24 hours) at 03/13/2021 0751 Last data filed at 03/13/2021 0600 Gross per 24 hour  Intake 480 ml  Output 2200 ml  Net -1720 ml   Filed Weights   03/11/21 0500 03/12/21 0544 03/13/21 0411  Weight: (!) 150.2 kg (!) 149.5 kg (!) 150.4 kg    Exam: General: No acute distress, calm Respiratory: Lungs are clear, room air, no increased work of breathing Cardiovascular: Heart sounds, normotensive, no peripheral edema other than trace chronic edema of legs Abdomen:  LBM 10/02 -soft nontender nondistended with normoactive bowel sounds.  Eating well. Neurologic: CN 2-12 grossly intact. Sensation intact, DTR normal. Strength 4/5 x all 4 extremities while in bed marked decrease strength with ambulation.  Also has significant decreased upper extremity strength with activities at about 2-3/5 Psychiatric: Normal judgment/insight. Alert and oriented x 3.     Assessment/Plan: Acute problems: Cellulitis of the right lower extremity age indeterminate right leg DVT: Doppler on 02/09/2021, age indeterminate right popliteal vein thrombosis.   Has completed 10 days of antibiotics for cellulitis Continue Eliquis for DVT.   Permanent A. fib:  Eliquis, diltiazem and Toprol-rate controlled  Recurrent hypokalemia Over the weekend Aldactone added to Lasix-continue potassium  for now and follow labs frequently to monitor for hyperkalemia Potassium on 10/3 was 3.7  Recurrent seasonal allergies Symptoms have improved with combination BID Zyrtec with Pepcid x 3 days Continue the same medications daily   General debility, chronic bilateral knee pain, right hip pain: Peer-to-peer completed on 9/30 was denied by the physician reviewer with  primary reason lack of improvement recently as well as over the past 4 months therefore it is believed she will not return to previous baseline and they will offer no more coverage for physical debility/rehab services Making slow improvements with therapies and recommendation is for inpatient rehabilitation Lives alone but will have 3 brothers who can intermittently come by and assist with her care after discharge  Obesity Body mass index is 44.97 kg/m.  Degree of obesity in context of underlying osteoarthritis has contributed to patient's severe debility and profound physical deconditioning Will obtain nutrition consult for weight loss education  Mild renal insufficiency Peak creatinine 1.42 with a GFR of 41 on 9/5 and now back to baseline Baseline creatinine 0.89 through 1.24 with average GFR greater than 6 Creatinine stable with addition of Aldactone.  On 10/2 creatinine was 1.65 but on 10/4 had decreased to 1.43  Chronic lower extremity edema Continue Lasix with Aldactone Renal function stable thus far with resumption of Jardiance   Diabetic neuropathy:  Continue with Lyrica   Type II Diabetes:  Patient was on Jardiance, Actos and glipizide prior to admission but was not on insulin. Actos discontinued secondary to edema and history of heart failure. Metformin resumed Glipizide discontinued in favor of Tradjenta Continue SSI and meal coverage while hospitalized GFR has recovered-HgbA1c June 2022 was 6.9  Nutrition BMI elevated without any evidence of malnutrition Continue carbohydrate modified diet Body mass index is 44.97 kg/m.    Stage II right buttock decubitus Wound / Incision (Open or Dehisced) 02/09/21 Non-pressure wound Tibial Posterior;Proximal;Right Open blister secondary to cellulitis (Active)  Date First Assessed/Time First Assessed: 02/09/21 1321   Wound Type: Non-pressure wound  Location: Tibial  Location Orientation: Posterior;Proximal;Right  Wound Description  (Comments): Open blister secondary to cellulitis  Present on Admission: Yes    Assessments 02/09/2021  1:00 PM 03/09/2021  9:00 PM  Dressing Type Foam - Lift dressing to assess site every shift None  Dressing Changed New --  Dressing Status Dry;Intact;Clean --  Dressing Change Frequency PRN --  Site / Wound Assessment -- Dry;Pink  Wound Length (cm) 4 cm --  Wound Width (cm) 5 cm --  Wound Depth (cm) 0 cm --  Wound Volume (cm^3) 0 cm^3 --  Wound Surface Area (cm^2) 20 cm^2 --  Drainage Amount Minimal --  Drainage Description Sanguineous;No odor --     No Linked orders to display     Wound / Incision (Open or Dehisced) 02/09/21 (ITD) Intertriginous Dermatitis Buttocks Circumferential red and raised rash (Active)  Date First Assessed/Time First Assessed: 02/09/21 1323   Wound Type: (ITD) Intertriginous Dermatitis  Location: (c) Buttocks  Location Orientation: Circumferential  Wound Description (Comments): red and raised rash  Present on Admission: Yes    Assessments 02/09/2021  1:00 PM 03/09/2021  9:00 PM  Dressing Type Foam - Lift dressing to assess site every shift None  Dressing Changed New --  Dressing Status Dry;Intact;Clean --  Dressing Change Frequency PRN --  Site / Wound Assessment -- Dry;Pink  Peri-wound Assessment -- Intact;Pink  Drainage Amount None --  Treatment Cleansed Cleansed;Other (Comment)     No Linked orders to  display     Pressure Injury 02/09/21 Buttocks Right Stage 2 -  Partial thickness loss of dermis presenting as a shallow open injury with a red, pink wound bed without slough. (Active)  Date First Assessed/Time First Assessed: 02/09/21 2059   Location: Buttocks  Location Orientation: Right  Staging: Stage 2 -  Partial thickness loss of dermis presenting as a shallow open injury with a red, pink wound bed without slough.  Present on A...    Assessments 02/09/2021  8:59 PM 03/12/2021  8:15 AM  Dressing Type Foam - Lift dressing to assess site every shift Foam - Lift  dressing to assess site every shift  Dressing -- Clean;Dry     No Linked orders to display      Data Reviewed: Basic Metabolic Panel: Recent Labs  Lab 03/07/21 0509 03/09/21 1000 03/10/21 0413 03/11/21 0248 03/13/21 0323  NA 138 139 139 137 137  K 3.0* 3.3* 3.7 3.7 4.0  CL 100 100 100 98 99  CO2 28 29 29 29 27   GLUCOSE 79 107* 95 117* 129*  BUN 21 24* 23 25* 30*  CREATININE 1.51* 1.34* 1.27* 1.65* 1.43*  CALCIUM 9.2 9.5 9.4 9.2 9.4  MG  --  2.1 2.3 2.3  --   PHOS  --  4.2 4.7* 5.1*  --     CBC: Recent Labs  Lab 03/09/21 1000 03/10/21 0413  WBC 10.3  --   HGB 10.9* 12.0  HCT 36.9 39.2  MCV 83.3  --   PLT 280  --      BNP (last 3 results) Recent Labs    02/08/21 2113 03/03/21 0251  BNP 210.6* 92.4    ProBNP (last 3 results) Recent Labs    03/20/20 1145 06/21/20 1129  PROBNP 125.0* 139.0*    CBG: Recent Labs  Lab 03/12/21 0622 03/12/21 1129 03/12/21 1613 03/12/21 2057 03/13/21 0549  GLUCAP 129* 142* 155* 158* 125*     Scheduled Meds:  apixaban  5 mg Oral BID   atorvastatin  40 mg Oral Daily   diltiazem  360 mg Oral Daily   empagliflozin  25 mg Oral Daily   famotidine  20 mg Oral Daily   ferrous sulfate  325 mg Oral Q24H   furosemide  40 mg Oral BID   Gerhardt's butt cream   Topical BID   hydrocerin   Topical BID   insulin aspart  0-9 Units Subcutaneous TID WC   insulin aspart  3 Units Subcutaneous TID WC   linagliptin  5 mg Oral Daily   loratadine  10 mg Oral Daily   metFORMIN  500 mg Oral BID WC   metoprolol succinate  100 mg Oral Daily   multivitamin with minerals  1 tablet Oral Daily   potassium chloride  40 mEq Oral BID   pregabalin  50 mg Oral TID   Ensure Max Protein  11 oz Oral QHS   sodium chloride flush  3 mL Intravenous Q12H   spironolactone  25 mg Oral Daily    Principal Problem:   Cellulitis of right lower extremity Active Problems:   Type 2 diabetes mellitus (HCC)   Hypertension associated with diabetes (HCC)    CKD (chronic kidney disease) stage 3, GFR 30-59 ml/min (HCC)   Chronic atrial fibrillation (HCC)   Chronic diastolic heart failure (HCC)   Hyperlipidemia associated with type 2 diabetes mellitus (HCC)   Pressure injury of skin   Consultants: None  Procedures: None  Antibiotics: IV cefazolin  9/2 through 9/6 PO cephalexin 9/7 through 9/12 PO doxycycline 9/8 through 9/12  Time spent: 15 minutes    Junious Silk ANP  [facility] 7 am - 330 pm/M-F for direct patient care and secure chat Please refer to Amion for contact info 29  days

## 2021-03-13 NOTE — Progress Notes (Signed)
Physical Therapy Treatment Patient Details Name: Pamela [name] MRN: [id number] DOB: 1955/11/01 Today's Date: 03/13/2021   History of Present Illness Pt is a 65 y.o. female admitted 02/08/21 with RLE swelling and erythema. Workup for RLE cellulitis, age-indeterminate RLE DVT. CXR with cardiomegaly, vascular congestion. PMH includes anxiety, depression, afib, CHF, DM2, HTN, rosacea, migraines, R knee DJD. Of note, admission 7/11-7/15/22 for GIB with discharge to SNF with recent return home 02/07/21.    PT Comments    Focused session on lower extremity exercises to promote strengthening and on transfer training. Pt reports her goal is to be able to stand and walk again, thus focused on coming to stand from elevated EOB to posterior aspect of recliner. Pt was successful in achieving ~75% of upright standing position ~6/9x with maxAx2 this date. Encouraged compliance with HEP. Will continue to follow acutely. Current recommendations remain appropriate.   Recommendations for follow up therapy are one component of a multi-disciplinary discharge planning process, led by the attending physician.  Recommendations may be updated based on patient status, additional functional criteria and insurance authorization.  Follow Up Recommendations  CIR;Supervision for mobility/OOB (IPR)     Equipment Recommendations  Other (comment) (tbd next venue)    Recommendations for Other Services       Precautions / Restrictions Precautions Precautions: Fall;Other (comment) Precaution Comments: Bladder/bowel incontinence Restrictions Weight Bearing Restrictions: No     Mobility  Bed Mobility Overal bed mobility: Needs Assistance Bed Mobility: Supine to Sit;Rolling Rolling: Supervision   Supine to sit: Supervision;HOB elevated     General bed mobility comments: Supervision for safety with pt using bed rails and controls for all bed mobility aspects.    Transfers Overall transfer level: Needs  assistance Equipment used:  (posterior aspect of recliner) Transfers: Sit to/from Stand;Lateral/Scoot Transfers Sit to Stand: From elevated surface;Max assist;+2 physical assistance;+2 safety/equipment        Lateral/Scoot Transfers: +2 safety/equipment;From elevated surface;Min guard General transfer comment: Sit <> stand attempts 9x (successful in standing up to ~75% fully majority of reps, 3x only got to ~50%)  from elevated EOB to posterior aspect of recliner with maxAx2 and use of bed pad under buttocks to assist in extending hips. Cues provided for feet placement, anterior pelvic tilt, and rocking of trunk to gain momentum and placement of "nose over toes". Lateral scoot from elevated EOB towards L to recliner with arm dropped with min guard assist, +2 to hold chair for safety.  Ambulation/Gait             General Gait Details: unable   Stairs             Wheelchair Mobility    Modified Rankin (Stroke Patients Only)       Balance Overall balance assessment: Needs assistance Sitting-balance support: No upper extremity supported;Feet supported Sitting balance-[name]: Good     Standing balance support: Bilateral upper extremity supported Standing balance-[name]: Poor Standing balance comment: MaxAx2 with use of bed pad to extend hips to stand with pt pulling on posterior aspect of recliner.                            Cognition Arousal/Alertness: Awake/alert Behavior During Therapy: WFL for tasks assessed/performed Overall Cognitive Status: Within Functional Limits for tasks assessed  General Comments: pleasant and agreeable to participate in session. Very motivated.      Exercises General Exercises - Lower Extremity Gluteal Sets: Strengthening;Both;20 reps;Supine Long Arc Quad: Strengthening;Both;10 reps;Seated Hip ABduction/ADduction: Strengthening;Both;20 reps;Seated Hip  Flexion/Marching: Strengthening;Both;20 reps;Seated Toe Raises: Strengthening;Both;20 reps;Seated Heel Raises: Strengthening;Both;20 reps;Seated    General Comments        Pertinent Vitals/Pain Pain Assessment: Faces Faces Pain Scale: Hurts a little bit Pain Location: bil knees Pain Descriptors / Indicators: Sore;Discomfort Pain Intervention(s): Limited activity within patient's tolerance;Monitored during session;Repositioned    Home Living                      Prior Function            PT Goals (current goals can now be found in the care plan section) Acute Rehab PT Goals Patient Stated Goal: to stand better and go to inpt rehab PT Goal Formulation: With patient Time For Goal Achievement: 03/25/21 Potential to Achieve Goals: Fair Progress towards PT goals: Progressing toward goals    Frequency    Min 3X/week      PT Plan Current plan remains appropriate    Co-evaluation              AM-PAC PT "6 Clicks" Mobility   Outcome Measure  Help needed turning from your back to your side while in a flat bed without using bedrails?: A Little Help needed moving from lying on your back to sitting on the side of a flat bed without using bedrails?: A Little Help needed moving to and from a bed to a chair (including a wheelchair)?: A Little Help needed standing up from a chair using your arms (e.g., wheelchair or bedside chair)?: Total Help needed to walk in hospital room?: Total Help needed climbing 3-5 steps with a railing? : Total 6 Click Score: 12    End of Session Equipment Utilized During Treatment: Gait belt Activity Tolerance: Patient tolerated treatment well Patient left: with call bell/phone within reach;in chair;with chair alarm set   PT Visit Diagnosis: Unsteadiness on feet (R26.81);Muscle weakness (generalized) (M62.81);Difficulty in walking, not elsewhere classified (R26.2) Pain - Right/Left:  (bil) Pain - part of body: Leg     Time:  4801-6553 PT Time Calculation (min) (ACUTE ONLY): 31 min  Charges:  $Therapeutic Exercise: 8-22 mins $Therapeutic Activity: 8-22 mins                     Raymond Gurney, PT, DPT Acute Rehabilitation Services  Pager: [phone] Office: [phone]    Pamela [name] 03/13/2021, 4:14 PM

## 2021-03-14 DIAGNOSIS — L03115 Cellulitis of right lower limb: Secondary | ICD-10-CM | POA: Diagnosis not present

## 2021-03-14 LAB — GLUCOSE, CAPILLARY
Glucose-Capillary: 136 mg/dL — ABNORMAL HIGH (ref 70–99)
Glucose-Capillary: 138 mg/dL — ABNORMAL HIGH (ref 70–99)
Glucose-Capillary: 144 mg/dL — ABNORMAL HIGH (ref 70–99)
Glucose-Capillary: 156 mg/dL — ABNORMAL HIGH (ref 70–99)

## 2021-03-14 NOTE — Progress Notes (Signed)
CSW spoke with patient who states she lives in a first floor apartment and access to her home does not require her to use steps. Patient states her appointment to enroll in Medicare benefits is tomorrow at 1:45pm.  Edwin Dada, MSW, LCSW Transitions of Care  Clinical Social Worker II [phone]

## 2021-03-14 NOTE — Progress Notes (Signed)
Occupational Therapy Treatment Patient Details Name: Pamela [name] MRN: [id number] DOB: 1955-12-16 Today's Date: 03/14/2021   History of present illness Pt is a 65 y.o. female admitted 02/08/21 with RLE swelling and erythema. Workup for RLE cellulitis, age-indeterminate RLE DVT. CXR with cardiomegaly, vascular congestion. PMH includes anxiety, depression, afib, CHF, DM2, HTN, rosacea, migraines, R knee DJD. Of note, admission 7/11-7/15/22 for GIB with discharge to SNF with recent return home 02/07/21.   OT comments  Increased resistance of theraband to level 4 and completed 20 reps B UEs. Completed seated ADL. Pt is eager to go home.    Recommendations for follow up therapy are one component of a multi-disciplinary discharge planning process, led by the attending physician.  Recommendations may be updated based on patient status, additional functional criteria and insurance authorization.    Follow Up Recommendations   (inpatient rehab)    Equipment Recommendations  Wheelchair (measurements OT);Wheelchair cushion (measurements OT)    Recommendations for Other Services      Precautions / Restrictions Precautions Precautions: Fall Precaution Comments: Bladder/bowel incontinence       Mobility Bed Mobility Overal bed mobility: Needs Assistance Bed Mobility: Supine to Sit;Sit to Supine     Supine to sit: Supervision;HOB elevated Sit to supine: Supervision   General bed mobility comments: using features of bed    Transfers                      Balance Overall balance assessment: Needs assistance Sitting-balance support: No upper extremity supported;Feet supported Sitting balance-[name]: Good                                     ADL either performed or assessed with clinical judgement   ADL Overall ADL's : Needs assistance/impaired     Grooming: Oral care;Sitting;Set up;Wash/dry face                                        Vision       Perception     Praxis      Cognition Arousal/Alertness: Awake/alert Behavior During Therapy: WFL for tasks assessed/performed Overall Cognitive Status: Within Functional Limits for tasks assessed                                          Exercises Exercises: General Upper Extremity General Exercises - Upper Extremity Shoulder Flexion: Strengthening;Both;20 reps;Theraband Theraband Level (Shoulder Flexion): Level 4 (Blue) Shoulder Horizontal ABduction: Strengthening;Both;20 reps;Theraband Theraband Level (Shoulder Horizontal Abduction): Level 4 (Blue) Elbow Flexion: Strengthening;20 reps;Theraband;Both Theraband Level (Elbow Flexion): Level 4 (Blue) Elbow Extension: Strengthening;Both;20 reps;Theraband Theraband Level (Elbow Extension): Level 4 (Blue)   Shoulder Instructions       General Comments      Pertinent Vitals/ Pain       Pain Assessment: Faces Faces Pain Scale: No hurt  Home Living                                          Prior Functioning/Environment              Frequency  Min 2X/week  Progress Toward Goals  OT Goals(current goals can now be found in the care plan section)  Progress towards OT goals: Progressing toward goals  Acute Rehab OT Goals Patient Stated Goal: to stand better and go to inpt rehab OT Goal Formulation: With patient Time For Goal Achievement: 03/25/21 Potential to Achieve Goals: Good  Plan Discharge plan remains appropriate    Co-evaluation                 AM-PAC OT "6 Clicks" Daily Activity     Outcome Measure   Help from another person eating meals?: None Help from another person taking care of personal grooming?: A Little Help from another person toileting, which includes using toliet, bedpan, or urinal?: A Lot Help from another person bathing (including washing, rinsing, drying)?: A Lot Help from another person to put on and taking off regular  upper body clothing?: A Little Help from another person to put on and taking off regular lower body clothing?: A Little 6 Click Score: 17    End of Session    OT Visit Diagnosis: Muscle weakness (generalized) (M62.81)   Activity Tolerance Patient tolerated treatment well   Patient Left in bed;with call bell/phone within reach   Nurse Communication          Time: 4496-7591 OT Time Calculation (min): 22 min  Charges: OT General Charges $OT Visit: 1 Visit OT Treatments $Therapeutic Exercise: 8-22 mins  Martie Round, OTR/L Acute Rehabilitation Services Pager: [phone] Office: [phone]  Evern Bio 03/14/2021, 3:14 PM

## 2021-03-14 NOTE — Progress Notes (Signed)
[facility] PROGRESS NOTE  Pamela [name] YQI:[id number] DOB: 10/08/1955 DOA: 02/08/2021 PCP: Corwin Levins, MD  Status: Remains inpatient appropriate because:Unsafe d/c plan patient is unable to independently stand for more than 1 minute and we are continuing aggressive PT and OT   Dispo: The patient is from: Home              Anticipated d/c is to: [facility]-insurance denied additional inpatient rehabilitation after peer to peer on 9/30.  Patient becomes eligible for Medicare on 10/7.  Hopeful can utilize Medicare for inpatient rehabilitation              Patient currently is medically stable to d/c.   Difficult to place patient Yes              Barriers to discharge: Has utilized all insurance days for SNF rehab or acute rehab.  Will be eligible for Medicare next month and once obtains initial Medicare number IR will accept patient   Level of care: Telemetry Cardiac  Code Status: DNR Family Communication: Patient only DVT prophylaxis: Eliquis COVID vaccination status: Pfizer 01/24/2020, 04/03/2020.  Eligible for booster  HPI: 65 year old with past medical history significant for permanent A. fib on Eliquis, chronic diastolic heart failure, CKD stage IIIa, diabetes type 2, hypertension, hyperlipidemia, depression, anxiety presented to the ED for evaluation of right leg swelling. Recently hospitalized 12/17/2020 until 12/21/2020 for lower GI bleed related to diverticulosis and stercoral ulcer.  She was discharged to SNF.  Patient was sent home from skilled nursing facility on 02/07/2021 as her insurance will not pay for further days, patient continued to have knee and hip problems with generalized debility.  Also she did not get a prescription for Eliquis and had not taken it for 3 days.  On 9/1 she noticed increasing swelling and erythema to her right lower leg and presented for evaluation.   Subjective: Sitting up in bed.  No specific complaints.  Aware of Medicare limitations in  regards to pain for inpatient rehabilitation services.  Objective: Vitals:   03/13/21 2025 03/14/21 0500  BP: (!) 108/58 112/67  Pulse: 93 82  Resp:    Temp: 98.4 F (36.9 C) 98.3 F (36.8 C)  SpO2: 97% 98%    Intake/Output Summary (Last 24 hours) at 03/14/2021 0819 Last data filed at 03/14/2021 0725 Gross per 24 hour  Intake 957 ml  Output 3550 ml  Net -2593 ml   Filed Weights   03/12/21 0544 03/13/21 0411 03/14/21 0500  Weight: (!) 149.5 kg (!) 150.4 kg (!) 147.9 kg    Exam: General: No acute distress, calm Respiratory: Lungs CTA, RA, no increased work of breathing Cardiovascular: S1-S2, no rubs murmurs thrills or gallops, normotensive, regular pulse Abdomen:  LBM 10/05 -soft nontender nondistended with normoactive bowel sounds.   Neurologic: CN 2-12 grossly intact. Sensation intact, DTR normal. Strength 4/5 x all 4 extremities while in bed marked decrease strength with ambulation.  Also has significant decreased upper extremity strength with activities at about 2-3/5 Psychiatric: Normal judgment/insight. Alert and oriented x 3.     Assessment/Plan: Acute problems: Cellulitis of the right lower extremity age indeterminate right leg DVT: Doppler on 02/09/2021, age indeterminate right popliteal vein thrombosis.   Completed 10 days of antibiotics  Continue Eliquis    Permanent A. fib:  Eliquis, diltiazem and Toprol-rate controlled  Recurrent hypokalemia Continue Aldactone, Lasix and potassium supplementation Potassium on 10/3 was 3.7 and on 10/5 potassium was 4.0  Recurrent seasonal allergies  Continue Zyrtec and Pepcid   General debility, chronic bilateral knee pain, right hip pain: Peer-to-peer completed on 9/30 was denied by the physician reviewer  Making slow improvements with therapies and recommendation is for inpatient rehabilitation Lives alone but will have 3 brothers who can intermittently come by and assist with her care after discharge  Obesity Body  mass index is 44.22 kg/m.  Degree of obesity in context of underlying osteoarthritis has contributed to patient's severe debility and profound physical deconditioning Will obtain nutrition consult for weight loss education  Mild renal insufficiency Peak creatinine 1.42 with a GFR of 41 on 9/5 and now back to baseline Baseline creatinine 0.89 through 1.24 with average GFR greater than 6 Creatinine stable with addition of Aldactone.  On 10/2 creatinine was 1.65 but on 10/4 had decreased to 1.43  Chronic lower extremity edema Continue Lasix with Aldactone Renal function stable thus far with resumption of Jardiance   Diabetic neuropathy:  Continue with Lyrica   Type II Diabetes:  Patient was on Jardiance, Actos and glipizide prior to admission but was not on insulin. Actos discontinued secondary to edema and history of heart failure. Metformin resumed Glipizide discontinued in favor of Tradjenta Continue SSI and meal coverage while hospitalized GFR has recovered-HgbA1c June 2022 was 6.9  Nutrition BMI elevated without any evidence of malnutrition Continue carbohydrate modified diet Body mass index is 44.22 kg/m.    Stage II right buttock decubitus Wound / Incision (Open or Dehisced) 02/09/21 Non-pressure wound Tibial Posterior;Proximal;Right Open blister secondary to cellulitis (Active)  Date First Assessed/Time First Assessed: 02/09/21 1321   Wound Type: Non-pressure wound  Location: Tibial  Location Orientation: Posterior;Proximal;Right  Wound Description (Comments): Open blister secondary to cellulitis  Present on Admission: Yes    Assessments 02/09/2021  1:00 PM 03/13/2021 10:00 AM  Dressing Type Foam - Lift dressing to assess site every shift --  Dressing Changed New --  Dressing Status Dry;Intact;Clean --  Dressing Change Frequency PRN --  Wound Length (cm) 4 cm 0 cm  Wound Width (cm) 5 cm 0 cm  Wound Depth (cm) 0 cm 0 cm  Wound Volume (cm^3) 0 cm^3 0 cm^3  Wound Surface  Area (cm^2) 20 cm^2 0 cm^2  Drainage Amount Minimal --  Drainage Description Sanguineous;No odor --     No Linked orders to display     Wound / Incision (Open or Dehisced) 02/09/21 (ITD) Intertriginous Dermatitis Buttocks Circumferential red and raised rash (Active)  Date First Assessed/Time First Assessed: 02/09/21 1323   Wound Type: (ITD) Intertriginous Dermatitis  Location: (c) Buttocks  Location Orientation: Circumferential  Wound Description (Comments): red and raised rash  Present on Admission: Yes    Assessments 02/09/2021  1:00 PM 03/13/2021 10:00 AM  Dressing Type Foam - Lift dressing to assess site every shift --  Dressing Changed New --  Dressing Status Dry;Intact;Clean --  Dressing Change Frequency PRN --  Wound Length (cm) -- 0 cm  Wound Width (cm) -- 0 cm  Wound Depth (cm) -- 0 cm  Wound Volume (cm^3) -- 0 cm^3  Wound Surface Area (cm^2) -- 0 cm^2  Drainage Amount None --  Treatment Cleansed --     No Linked orders to display     Pressure Injury 02/09/21 Buttocks Right Stage 2 -  Partial thickness loss of dermis presenting as a shallow open injury with a red, pink wound bed without slough. (Active)  Date First Assessed/Time First Assessed: 02/09/21 2059   Location: Buttocks  Location Orientation:  Right  Staging: Stage 2 -  Partial thickness loss of dermis presenting as a shallow open injury with a red, pink wound bed without slough.  Present on A...    Assessments 02/09/2021  8:59 PM 03/13/2021 10:00 AM  Dressing Type Foam - Lift dressing to assess site every shift --  Wound Length (cm) -- 0 cm  Wound Width (cm) -- 0 cm  Wound Depth (cm) -- 0 cm  Wound Surface Area (cm^2) -- 0 cm^2  Wound Volume (cm^3) -- 0 cm^3     No Linked orders to display      Data Reviewed: Basic Metabolic Panel: Recent Labs  Lab 03/09/21 1000 03/10/21 0413 03/11/21 0248 03/13/21 0323  NA 139 139 137 137  K 3.3* 3.7 3.7 4.0  CL 100 100 98 99  CO2 29 29 29 27   GLUCOSE 107* 95 117* 129*   BUN 24* 23 25* 30*  CREATININE 1.34* 1.27* 1.65* 1.43*  CALCIUM 9.5 9.4 9.2 9.4  MG 2.1 2.3 2.3  --   PHOS 4.2 4.7* 5.1*  --     CBC: Recent Labs  Lab 03/09/21 1000 03/10/21 0413  WBC 10.3  --   HGB 10.9* 12.0  HCT 36.9 39.2  MCV 83.3  --   PLT 280  --      BNP (last 3 results) Recent Labs    02/08/21 2113 03/03/21 0251  BNP 210.6* 92.4    ProBNP (last 3 results) Recent Labs    03/20/20 1145 06/21/20 1129  PROBNP 125.0* 139.0*    CBG: Recent Labs  Lab 03/13/21 0549 03/13/21 1121 03/13/21 1615 03/13/21 2124 03/14/21 0557  GLUCAP 125* 123* 149* 149* 136*     Scheduled Meds:  apixaban  5 mg Oral BID   atorvastatin  40 mg Oral Daily   diltiazem  360 mg Oral Daily   empagliflozin  25 mg Oral Daily   famotidine  20 mg Oral Daily   ferrous sulfate  325 mg Oral Q24H   furosemide  40 mg Oral BID   Gerhardt's butt cream   Topical BID   hydrocerin   Topical BID   insulin aspart  0-9 Units Subcutaneous TID WC   insulin aspart  3 Units Subcutaneous TID WC   linagliptin  5 mg Oral Daily   loratadine  10 mg Oral Daily   metFORMIN  500 mg Oral BID WC   metoprolol succinate  100 mg Oral Daily   multivitamin with minerals  1 tablet Oral Daily   potassium chloride  40 mEq Oral BID   pregabalin  50 mg Oral TID   Ensure Max Protein  11 oz Oral QHS   sodium chloride flush  3 mL Intravenous Q12H   spironolactone  25 mg Oral Daily    Principal Problem:   Cellulitis of right lower extremity Active Problems:   Type 2 diabetes mellitus (HCC)   Hypertension associated with diabetes (HCC)   CKD (chronic kidney disease) stage 3, GFR 30-59 ml/min (HCC)   Chronic atrial fibrillation (HCC)   Chronic diastolic heart failure (HCC)   Hyperlipidemia associated with type 2 diabetes mellitus (HCC)   Pressure injury of skin   Consultants: None  Procedures: None  Antibiotics: IV cefazolin 9/2 through 9/6 PO cephalexin 9/7 through 9/12 PO doxycycline 9/8 through  9/12  Time spent: 15 minutes    11/12 ANP  [facility] 7 am - 330 pm/M-F for direct patient care and secure chat Please refer to  Amion for contact info 30  days

## 2021-03-15 DIAGNOSIS — L03115 Cellulitis of right lower limb: Secondary | ICD-10-CM | POA: Diagnosis not present

## 2021-03-15 LAB — GLUCOSE, CAPILLARY
Glucose-Capillary: 144 mg/dL — ABNORMAL HIGH (ref 70–99)
Glucose-Capillary: 160 mg/dL — ABNORMAL HIGH (ref 70–99)
Glucose-Capillary: 177 mg/dL — ABNORMAL HIGH (ref 70–99)
Glucose-Capillary: 148 mg/dL — ABNORMAL HIGH (ref 70–99)

## 2021-03-15 NOTE — Progress Notes (Signed)
[facility] PROGRESS NOTE  Pamela [name] QMV:[id number] DOB: 1956/06/04 DOA: 02/08/2021 PCP: Corwin Levins, MD  Status: Remains inpatient appropriate because:Unsafe d/c plan patient is unable to independently stand for more than 1 minute and we are continuing aggressive PT and OT   Dispo: The patient is from: Home              Anticipated d/c is to: [facility] vs SNF              Patient currently is medically stable to d/c.   Difficult to place patient Yes              Barriers to discharge: Primary insurance Rosann Auerbach has denied further inpatient treatments for physical debility.  Patient will become eligible for Medicare A after 10/7 but this does not completely find inpatient rehab services and patient would be responsible for paying the physician portion independent   Level of care: Telemetry Cardiac  Code Status: DNR Family Communication: Patient only DVT prophylaxis: Eliquis COVID vaccination status: Pfizer 01/24/2020, 04/03/2020.  Eligible for booster  HPI: 65 year old with past medical history significant for permanent A. fib on Eliquis, chronic diastolic heart failure, CKD stage IIIa, diabetes type 2, hypertension, hyperlipidemia, depression, anxiety presented to the ED for evaluation of right leg swelling. Recently hospitalized 12/17/2020 until 12/21/2020 for lower GI bleed related to diverticulosis and stercoral ulcer.  She was discharged to SNF.  Patient was sent home from skilled nursing facility on 02/07/2021 as her insurance will not pay for further days, patient continued to have knee and hip problems with generalized debility.  Also she did not get a prescription for Eliquis and had not taken it for 3 days.  On 9/1 she noticed increasing swelling and erythema to her right lower leg and presented for evaluation.   Subjective: Remains frustrated over process to transition to inpatient rehabilitation noting her private insurance will no longer pay for rehabilitation services  and Medicare a will not pay for physician services.  She states she is sleeping okay despite current stressors of of as well as part of current hospitalization not covered by insurance.  She states she has an appointment with Social Security today at 145 and states in addition to completing process for Medicare a she would like to sign up for Medicare B.  Objective: Vitals:   03/14/21 2054 03/15/21 0418  BP: (!) 107/57 102/73  Pulse: 93 90  Resp: 18 18  Temp: 98.7 F (37.1 C) 97.8 F (36.6 C)  SpO2: 90% 98%    Intake/Output Summary (Last 24 hours) at 03/15/2021 0737 Last data filed at 03/15/2021 0500 Gross per 24 hour  Intake 1203 ml  Output 3200 ml  Net -1997 ml   Filed Weights   03/13/21 0411 03/14/21 0500 03/15/21 0418  Weight: (!) 150.4 kg (!) 147.9 kg (!) 148.7 kg    Exam: General: No acute distress, calm but with somewhat depressed affect Respiratory: Lungs remain clear anteriorly, no increased work of breathing, O2 saturations on room air 95 to 98% Cardiovascular: Normal heart sounds S1-S2 without murmurs, remains normotensive, chronic stable lower extremity edema which is nonpitting, pulse remains regular Abdomen:  LBM 10/05 -soft nontender nondistended with normoactive bowel sounds.   Neurologic: CN 2-12 grossly intact. Sensation intact, DTR normal. Strength 4/5 x all 4 extremities while in bed marked decrease strength with ambulation.  Also has significant decreased upper extremity strength with activities at about 2-3/5 Psychiatric: Normal judgment/insight. Alert and oriented x  3.  Slightly depressed affect   Assessment/Plan: Acute problems: Cellulitis of the right lower extremity age indeterminate right leg DVT: Doppler on 02/09/2021, age indeterminate right popliteal vein thrombosis.  Continue Eliquis Completed 10 days of antibiotics    Permanent A. fib:  Continue Eliquis, diltiazem and Toprol-rate controlled  Recurrent hypokalemia Continue Aldactone, Lasix and  potassium supplementation Potassium on 10/3 was 3.7 and on 10/5 potassium was 4.0  Recurrent seasonal allergies Continue Zyrtec and Pepcid   General debility, chronic bilateral knee pain, right hip pain: Peer-to-peer completed on 9/30 was denied by the physician reviewer  Making slow improvements with therapies and recommendation is for inpatient rehabilitation Lives alone but will have 3 brothers who can intermittently come by and assist with her care after discharge  Obesity Body mass index is 44.46 kg/m.  Degree of obesity in context of underlying osteoarthritis has contributed to patient's severe debility and profound physical deconditioning Will obtain nutrition consult for weight loss education  Mild renal insufficiency Peak creatinine 1.42 with a GFR of 41 on 9/5 and now back to baseline Baseline creatinine 0.89 through 1.24 with average GFR greater than 6 Creatinine stable with addition of Aldactone.  On 10/2 creatinine was 1.65 but on 10/4 had decreased to 1.43  Chronic lower extremity edema Continue Lasix with Aldactone Renal function stable thus far with resumption of Jardiance   Diabetic neuropathy:  Continue with Lyrica   Type II Diabetes:  Patient was on Jardiance, Actos and glipizide prior to admission but was not on insulin. Actos discontinued secondary to edema and history of heart failure. Metformin resumed Glipizide discontinued in favor of Tradjenta Continue SSI and meal coverage while hospitalized GFR has recovered-HgbA1c June 2022 was 6.9  Nutrition BMI elevated without any evidence of malnutrition Continue carbohydrate modified diet Body mass index is 44.46 kg/m.    Stage II right buttock decubitus Wound / Incision (Open or Dehisced) 02/09/21 Non-pressure wound Tibial Posterior;Proximal;Right Open blister secondary to cellulitis (Active)  Date First Assessed/Time First Assessed: 02/09/21 1321   Wound Type: Non-pressure wound  Location: Tibial   Location Orientation: Posterior;Proximal;Right  Wound Description (Comments): Open blister secondary to cellulitis  Present on Admission: Yes    Assessments 02/09/2021  1:00 PM 03/13/2021 10:00 AM  Dressing Type Foam - Lift dressing to assess site every shift --  Dressing Changed New --  Dressing Status Dry;Intact;Clean --  Dressing Change Frequency PRN --  Wound Length (cm) 4 cm 0 cm  Wound Width (cm) 5 cm 0 cm  Wound Depth (cm) 0 cm 0 cm  Wound Volume (cm^3) 0 cm^3 0 cm^3  Wound Surface Area (cm^2) 20 cm^2 0 cm^2  Drainage Amount Minimal --  Drainage Description Sanguineous;No odor --     No Linked orders to display     Wound / Incision (Open or Dehisced) 02/09/21 (ITD) Intertriginous Dermatitis Buttocks Circumferential red and raised rash (Active)  Date First Assessed/Time First Assessed: 02/09/21 1323   Wound Type: (ITD) Intertriginous Dermatitis  Location: (c) Buttocks  Location Orientation: Circumferential  Wound Description (Comments): red and raised rash  Present on Admission: Yes    Assessments 02/09/2021  1:00 PM 03/14/2021  8:30 PM  Dressing Type Foam - Lift dressing to assess site every shift Other (Comment)  Dressing Changed New --  Dressing Status Dry;Intact;Clean --  Dressing Change Frequency PRN --  Drainage Amount None --  Treatment Cleansed --     No Linked orders to display     Pressure Injury 02/09/21  Buttocks Right Stage 2 -  Partial thickness loss of dermis presenting as a shallow open injury with a red, pink wound bed without slough. (Active)  Date First Assessed/Time First Assessed: 02/09/21 2059   Location: Buttocks  Location Orientation: Right  Staging: Stage 2 -  Partial thickness loss of dermis presenting as a shallow open injury with a red, pink wound bed without slough.  Present on A...    Assessments 02/09/2021  8:59 PM 03/13/2021 10:00 AM  Dressing Type Foam - Lift dressing to assess site every shift --  Wound Length (cm) -- 0 cm  Wound Width (cm) -- 0 cm   Wound Depth (cm) -- 0 cm  Wound Surface Area (cm^2) -- 0 cm^2  Wound Volume (cm^3) -- 0 cm^3     No Linked orders to display      Data Reviewed: Basic Metabolic Panel: Recent Labs  Lab 03/09/21 1000 03/10/21 0413 03/11/21 0248 03/13/21 0323  NA 139 139 137 137  K 3.3* 3.7 3.7 4.0  CL 100 100 98 99  CO2 29 29 29 27   GLUCOSE 107* 95 117* 129*  BUN 24* 23 25* 30*  CREATININE 1.34* 1.27* 1.65* 1.43*  CALCIUM 9.5 9.4 9.2 9.4  MG 2.1 2.3 2.3  --   PHOS 4.2 4.7* 5.1*  --     CBC: Recent Labs  Lab 03/09/21 1000 03/10/21 0413  WBC 10.3  --   HGB 10.9* 12.0  HCT 36.9 39.2  MCV 83.3  --   PLT 280  --      BNP (last 3 results) Recent Labs    02/08/21 2113 03/03/21 0251  BNP 210.6* 92.4    ProBNP (last 3 results) Recent Labs    03/20/20 1145 06/21/20 1129  PROBNP 125.0* 139.0*    CBG: Recent Labs  Lab 03/14/21 0557 03/14/21 1130 03/14/21 1653 03/14/21 2131 03/15/21 0621  GLUCAP 136* 138* 144* 156* 144*     Scheduled Meds:  apixaban  5 mg Oral BID   atorvastatin  40 mg Oral Daily   diltiazem  360 mg Oral Daily   empagliflozin  25 mg Oral Daily   famotidine  20 mg Oral Daily   ferrous sulfate  325 mg Oral Q24H   furosemide  40 mg Oral BID   Gerhardt's butt cream   Topical BID   hydrocerin   Topical BID   insulin aspart  0-9 Units Subcutaneous TID WC   insulin aspart  3 Units Subcutaneous TID WC   linagliptin  5 mg Oral Daily   loratadine  10 mg Oral Daily   metFORMIN  500 mg Oral BID WC   metoprolol succinate  100 mg Oral Daily   multivitamin with minerals  1 tablet Oral Daily   potassium chloride  40 mEq Oral BID   pregabalin  50 mg Oral TID   Ensure Max Protein  11 oz Oral QHS   sodium chloride flush  3 mL Intravenous Q12H   spironolactone  25 mg Oral Daily    Principal Problem:   Cellulitis of right lower extremity Active Problems:   Type 2 diabetes mellitus (HCC)   Hypertension associated with diabetes (HCC)   CKD (chronic  kidney disease) stage 3, GFR 30-59 ml/min (HCC)   Chronic atrial fibrillation (HCC)   Chronic diastolic heart failure (HCC)   Hyperlipidemia associated with type 2 diabetes mellitus (HCC)   Pressure injury of skin   Consultants: None  Procedures: None  Antibiotics: IV cefazolin 9/2  through 9/6 PO cephalexin 9/7 through 9/12 PO doxycycline 9/8 through 9/12  Time spent: 15 minutes    Junious Silk ANP  [facility] 7 am - 330 pm/M-F for direct patient care and secure chat Please refer to Amion for contact info 31  days

## 2021-03-15 NOTE — Progress Notes (Signed)
OT Cancellation Note  Patient Details Name: THEKLA COLBORN MRN: [id number] DOB: 1955/10/15   Cancelled Treatment:    Reason Eval/Treat Not Completed: Patient declined, no reason specified Pt politely declines OT session as she has an important insurance meeting soon via phone at 1:45PM. Will follow up for OT session as schedule permits.   Lorre Munroe 03/15/2021, 1:28 PM

## 2021-03-15 NOTE — Progress Notes (Addendum)
Pt request to d/c PIV.  No IV meds ordered.  She is medically cleared for discharge. Pending medicare enrollment and insurance auth, bed availability for SNF.  Dr. Blake Divine approved removal of IV and aware that pt has no IV access.

## 2021-03-15 NOTE — Progress Notes (Signed)
PT Cancellation Note  Patient Details Name: Pamela [name] MRN: [id number] DOB: 14-Oct-1955   Cancelled Treatment:    Reason Eval/Treat Not Completed: Patient declined, no reason specified;Fatigue/lethargy limiting ability to participate. Attempted at 13:23 but pt politely declined PT session as she had an important insurance meeting soon via phone at 13:45. Attempted again at 17:18 with pt eating dinner and pt requesting hold on PT this date as she was fatigued at this time. Pt agreed to perform some exercises off her HEP handout tonight instead. Will plan to follow-up another day as able.   Raymond Gurney, PT, DPT Acute Rehabilitation Services  Pager: [phone] Office: [phone]    Jewel Baize 03/15/2021, 5:45 PM

## 2021-03-15 NOTE — Progress Notes (Addendum)
CSW spoke with patient who states she just completed her insurance enrollment appointment and now has Medicare A and B #[id number].  CSW notified Verdon Cummins of [facility] of information.  Edwin Dada, MSW, LCSW Transitions of Care  Clinical Social Worker II [phone]

## 2021-03-16 DIAGNOSIS — I5032 Chronic diastolic (congestive) heart failure: Secondary | ICD-10-CM | POA: Diagnosis not present

## 2021-03-16 DIAGNOSIS — L03115 Cellulitis of right lower limb: Secondary | ICD-10-CM | POA: Diagnosis not present

## 2021-03-16 LAB — GLUCOSE, CAPILLARY
Glucose-Capillary: 146 mg/dL — ABNORMAL HIGH (ref 70–99)
Glucose-Capillary: 131 mg/dL — ABNORMAL HIGH (ref 70–99)
Glucose-Capillary: 125 mg/dL — ABNORMAL HIGH (ref 70–99)
Glucose-Capillary: 168 mg/dL — ABNORMAL HIGH (ref 70–99)

## 2021-03-16 NOTE — Progress Notes (Signed)
PROGRESS NOTE    Pamela [name]  UYQ:[id number] DOB: 12/31/1955 DOA: 02/08/2021 PCP: Corwin Levins, MD    Chief Complaint  Patient presents with   Shortness of Breath    Brief Narrative:  65 year old prior history of atrial fibrillation on Eliquis, chronic diastolic heart failure stage IIIa CKD type 2 diabetes, hypertension hyperlipidemia, depression, anxiety presents to ED with right leg swelling.  Patient was also recently hospitalized and discharged with lower GI bleed related to diverticulosis and stercoral ulcer.  She was discharged to SNF and she went home after that.   Assessment & Plan:   Principal Problem:   Cellulitis of right lower extremity Active Problems:   Type 2 diabetes mellitus (HCC)   Hypertension associated with diabetes (HCC)   CKD (chronic kidney disease) stage 3, GFR 30-59 ml/min (HCC)   Chronic atrial fibrillation (HCC)   Chronic diastolic heart failure (HCC)   Hyperlipidemia associated with type 2 diabetes mellitus (HCC)   Pressure injury of skin  Cellulitis of the right lower extremity with indeterminate right leg DVT Continue with Eliquis and completed 10 days of antibiotics.   Atrial fibrillation Rate control with Cardizem and metoprolol On Eliquis for anticoagulation.   Morbid obesity Patient will need outpatient follow-up with PCP for weight loss education and nutritional education.     Recurrent hypokalemia Replaced    Type 2 diabetes mellitus Continue with sliding scale insulin. hemoglobin A1c was 6.9    Stage IIIa CKD Continue to monitor    Chronic diastolic heart failure Patient appears to be euvolemic   Pressure injury Pressure Injury 02/09/21 Buttocks Right Stage 2 -  Partial thickness loss of dermis presenting as a shallow open injury with a red, pink wound bed without slough. (Active)  02/09/21 2059  Location: Buttocks  Location Orientation: Right  Staging: Stage 2 -  Partial thickness loss of dermis presenting  as a shallow open injury with a red, pink wound bed without slough.  Wound Description (Comments):   Present on Admission: Yes   Present on admission and further management as per wound care.      DVT prophylaxis: Eliquis  code Status: DNR Family Communication: None at bedside Disposition:   Status is: Inpatient  Remains inpatient appropriate because:Unsafe d/c plan  Dispo: The patient is from: Home              Anticipated d/c is to: SNF              Patient currently is medically stable to d/c.   Difficult to place patient No       Consultants:  None  Procedures: None none at Antimicrobials: none.    Subjective: No new complaints  Objective: Vitals:   03/15/21 2038 03/16/21 0402 03/16/21 0739 03/16/21 1226  BP: (!) 119/51 117/67 118/76 110/74  Pulse: 71 98 (!) 101 85  Resp: 18 18  20   Temp: 97.9 F (36.6 C) 98.2 F (36.8 C) 98.3 F (36.8 C) 98.8 F (37.1 C)  TempSrc: Oral Oral Oral Oral  SpO2: 96% (!) 89% 92% 95%  Weight:  (!) 149.1 kg    Height:        Intake/Output Summary (Last 24 hours) at 03/16/2021 1651 Last data filed at 03/16/2021 1227 Gross per 24 hour  Intake 1470 ml  Output 3050 ml  Net -1580 ml   Filed Weights   03/14/21 0500 03/15/21 0418 03/16/21 0402  Weight: (!) 147.9 kg (!) 148.7 kg (!) 149.1 kg  Examination:  General exam: Appears calm and comfortable  Respiratory system: Clear to auscultation. Respiratory effort normal. Cardiovascular system: S1 & S2 heard, RRR. No JVD,  Gastrointestinal system: Abdomen is nondistended, soft and nontender. Normal bowel sounds heard. Central nervous system: Alert and oriented. No focal neurological deficits. Extremities: Symmetric 5 x 5 power. Skin: No rashes, lesions or ulcers Psychiatry: Mood & affect appropriate.     Data Reviewed: I have personally reviewed following labs and imaging studies  CBC: Recent Labs  Lab 03/10/21 0413  HGB 12.0  HCT 39.2    Basic Metabolic  Panel: Recent Labs  Lab 03/10/21 0413 03/11/21 0248 03/13/21 0323  NA 139 137 137  K 3.7 3.7 4.0  CL 100 98 99  CO2 29 29 27   GLUCOSE 95 117* 129*  BUN 23 25* 30*  CREATININE 1.27* 1.65* 1.43*  CALCIUM 9.4 9.2 9.4  MG 2.3 2.3  --   PHOS 4.7* 5.1*  --     GFR: Estimated Creatinine Clearance: 64.1 mL/min (A) (by C-G formula based on SCr of 1.43 mg/dL (H)).  Liver Function Tests: Recent Labs  Lab 03/10/21 0413 03/11/21 0248  ALBUMIN 2.9* 2.9*    CBG: Recent Labs  Lab 03/15/21 1614 03/15/21 2112 03/16/21 0608 03/16/21 1053 03/16/21 1542  GLUCAP 177* 148* 146* 131* 125*     No results found for this or any previous visit (from the past 240 hour(s)).       Radiology Studies: No results found.      Scheduled Meds:  apixaban  5 mg Oral BID   atorvastatin  40 mg Oral Daily   diltiazem  360 mg Oral Daily   empagliflozin  25 mg Oral Daily   famotidine  20 mg Oral Daily   ferrous sulfate  325 mg Oral Q24H   furosemide  40 mg Oral BID   Gerhardt's butt cream   Topical BID   hydrocerin   Topical BID   insulin aspart  0-9 Units Subcutaneous TID WC   insulin aspart  3 Units Subcutaneous TID WC   linagliptin  5 mg Oral Daily   loratadine  10 mg Oral Daily   metFORMIN  500 mg Oral BID WC   metoprolol succinate  100 mg Oral Daily   multivitamin with minerals  1 tablet Oral Daily   potassium chloride  40 mEq Oral BID   pregabalin  50 mg Oral TID   Ensure Max Protein  11 oz Oral QHS   spironolactone  25 mg Oral Daily   Continuous Infusions:   LOS: 32 days        05/16/21, MD [facility]   To contact the attending provider between 7A-7P or the covering provider during after hours 7P-7A, please log into the web site [email or web address] and access using universal [place] password for that web site. If you do not have the password, please call the hospital operator.  03/16/2021, 4:51 PM

## 2021-03-17 DIAGNOSIS — I5032 Chronic diastolic (congestive) heart failure: Secondary | ICD-10-CM | POA: Diagnosis not present

## 2021-03-17 DIAGNOSIS — L03115 Cellulitis of right lower limb: Secondary | ICD-10-CM | POA: Diagnosis not present

## 2021-03-17 LAB — BASIC METABOLIC PANEL
Anion gap: 12 (ref 5–15)
BUN: 26 mg/dL — ABNORMAL HIGH (ref 8–23)
CO2: 26 mmol/L (ref 22–32)
Calcium: 9.6 mg/dL (ref 8.9–10.3)
Chloride: 99 mmol/L (ref 98–111)
Creatinine, Ser: 1.65 mg/dL — ABNORMAL HIGH (ref 0.44–1.00)
GFR, Estimated: 34 mL/min — ABNORMAL LOW (ref >60)
Glucose, Bld: 148 mg/dL — ABNORMAL HIGH (ref 70–99)
Potassium: 4.1 mmol/L (ref 3.5–5.1)
Sodium: 137 mmol/L (ref 135–145)

## 2021-03-17 LAB — GLUCOSE, CAPILLARY
Glucose-Capillary: 193 mg/dL — ABNORMAL HIGH (ref 70–99)
Glucose-Capillary: 156 mg/dL — ABNORMAL HIGH (ref 70–99)
Glucose-Capillary: 171 mg/dL — ABNORMAL HIGH (ref 70–99)
Glucose-Capillary: 173 mg/dL — ABNORMAL HIGH (ref 70–99)
Glucose-Capillary: 189 mg/dL — ABNORMAL HIGH (ref 70–99)

## 2021-03-17 NOTE — Progress Notes (Signed)
PROGRESS NOTE    Pamela [name]  PYP:[id number] DOB: [birth year] DOA: 02/08/2021 PCP: Corwin Levins, MD    Chief Complaint  Patient presents with   Shortness of Breath    Brief Narrative:  65 year old prior history of atrial fibrillation on Eliquis, chronic diastolic heart failure stage IIIa CKD type 2 diabetes, hypertension hyperlipidemia, depression, anxiety presents to ED with right leg swelling.  Patient was also recently hospitalized and discharged with lower GI bleed related to diverticulosis and stercoral ulcer.  She was discharged to SNF and she went home after that. Patient has completed the treatment for cellulitis and on Eliquis for the DVT. Therapy evaluations recommending SNF.  Currently waiting for a bed for placement.  Patient is medically stable for discharge.   Assessment & Plan:   Principal Problem:   Cellulitis of right lower extremity Active Problems:   Type 2 diabetes mellitus (HCC)   Hypertension associated with diabetes (HCC)   CKD (chronic kidney disease) stage 3, GFR 30-59 ml/min (HCC)   Chronic atrial fibrillation (HCC)   Chronic diastolic heart failure (HCC)   Hyperlipidemia associated with type 2 diabetes mellitus (HCC)   Pressure injury of skin  Cellulitis of the right lower extremity with indeterminate right leg DVT Continue with Eliquis and completed 10 days of antibiotics.   Atrial fibrillation Rate control with Cardizem and metoprolol On Eliquis for anticoagulation.   Morbid obesity Patient will need outpatient follow-up with PCP for weight loss education and nutritional education.     Recurrent hypokalemia Replaced    Stage IIIa CKD Recheck BMP today Continue to monitor    Chronic diastolic heart failure Patient appears to be euvolemic Continue with 40 mg of Lasix twice daily. Patient continues to have bilateral pedal edema, recheck BMP today and dose Lasix. Patient has diuresed about 26 L so far   Pressure injury present  on admission Pressure Injury 02/09/21 Buttocks Right Stage 2 -  Partial thickness loss of dermis presenting as a shallow open injury with a red, pink wound bed without slough. (Active)  02/09/21 2059  Location: Buttocks  Location Orientation: Right  Staging: Stage 2 -  Partial thickness loss of dermis presenting as a shallow open injury with a red, pink wound bed without slough.  Wound Description (Comments):   Present on Admission: Yes   Present on admission and further management as per wound care.   Essential hypertension Blood pressure parameters appear to be optimal   Type 2 diabetes mellitus Continue with metformin, Tradjenta. CBG (last 3)  Recent Labs    03/17/21 0608 03/17/21 0728 03/17/21 1127  GLUCAP 193* 156* 171*  Last A1c around 6.9.     Diabetic neuropathy Continue with Lyrica 50 mg 3 times daily.   DVT prophylaxis: Eliquis  code Status: DNR Family Communication: None at bedside Disposition:   Status is: Inpatient  Remains inpatient appropriate because:Unsafe d/c plan  Dispo: The patient is from: Home              Anticipated d/c is to: SNF              Patient currently is medically stable to d/c.   Difficult to place patient No       Consultants:  None  Procedures: None none at Antimicrobials: none.    Subjective: No new complaints this morning  Objective: Vitals:   03/16/21 1931 03/17/21 0355 03/17/21 1055 03/17/21 1225  BP: 113/63 115/74 124/71 133/78  Pulse: 80 89 98 (!) 104  Resp: 18 18 16 18   Temp: 98.2 F (36.8 C) 98.2 F (36.8 C) 98.6 F (37 C) 98.5 F (36.9 C)  TempSrc: Oral Oral Oral Oral  SpO2: 94% 91% 92% 93%  Weight:  (!) 147.9 kg    Height:        Intake/Output Summary (Last 24 hours) at 03/17/2021 1412 Last data filed at 03/17/2021 1108 Gross per 24 hour  Intake 1080 ml  Output 2700 ml  Net -1620 ml    Filed Weights   03/15/21 0418 03/16/21 0402 03/17/21 0355  Weight: (!) 148.7 kg (!) 149.1 kg (!)  147.9 kg    Examination:  General exam: Appears calm and comfortable  Respiratory system: Clear to auscultation. Respiratory effort normal. Cardiovascular system: S1 & S2 heard, RRR. No JVD, 3+ pedal edema Gastrointestinal system: Abdomen is nondistended, soft and nontender. . Normal bowel sounds heard. Central nervous system: Alert and oriented. No focal neurological deficits. Extremities: Symmetric 5 x 5 power. Skin: No rashes, lesions or ulcers Psychiatry: Mood & affect appropriate.    Data Reviewed: I have personally reviewed following labs and imaging studies  CBC: No results for input(s): WBC, NEUTROABS, HGB, HCT, MCV, PLT in the last 168 hours.   Basic Metabolic Panel: Recent Labs  Lab 03/11/21 0248 03/13/21 0323  NA 137 137  K 3.7 4.0  CL 98 99  CO2 29 27  GLUCOSE 117* 129*  BUN 25* 30*  CREATININE 1.65* 1.43*  CALCIUM 9.2 9.4  MG 2.3  --   PHOS 5.1*  --      GFR: Estimated Creatinine Clearance: 63.8 mL/min (A) (by C-G formula based on SCr of 1.43 mg/dL (H)).  Liver Function Tests: Recent Labs  Lab 03/11/21 0248  ALBUMIN 2.9*     CBG: Recent Labs  Lab 03/16/21 1542 03/16/21 2118 03/17/21 0608 03/17/21 0728 03/17/21 1127  GLUCAP 125* 168* 193* 156* 171*      No results found for this or any previous visit (from the past 240 hour(s)).       Radiology Studies: No results found.      Scheduled Meds:  apixaban  5 mg Oral BID   atorvastatin  40 mg Oral Daily   diltiazem  360 mg Oral Daily   empagliflozin  25 mg Oral Daily   famotidine  20 mg Oral Daily   ferrous sulfate  325 mg Oral Q24H   furosemide  40 mg Oral BID   Gerhardt's butt cream   Topical BID   hydrocerin   Topical BID   insulin aspart  0-9 Units Subcutaneous TID WC   insulin aspart  3 Units Subcutaneous TID WC   linagliptin  5 mg Oral Daily   loratadine  10 mg Oral Daily   metFORMIN  500 mg Oral BID WC   metoprolol succinate  100 mg Oral Daily   multivitamin  with minerals  1 tablet Oral Daily   potassium chloride  40 mEq Oral BID   pregabalin  50 mg Oral TID   Ensure Max Protein  11 oz Oral QHS   spironolactone  25 mg Oral Daily   Continuous Infusions:   LOS: 33 days        05/17/21, MD [facility]   To contact the attending provider between 7A-7P or the covering provider during after hours 7P-7A, please log into the web site [email or web address] and access using universal [place] password for that web site. If you do not have the password, please  call the hospital operator.  03/17/2021, 2:12 PM

## 2021-03-18 DIAGNOSIS — L03115 Cellulitis of right lower limb: Secondary | ICD-10-CM | POA: Diagnosis not present

## 2021-03-18 LAB — GLUCOSE, CAPILLARY
Glucose-Capillary: 162 mg/dL — ABNORMAL HIGH (ref 70–99)
Glucose-Capillary: 160 mg/dL — ABNORMAL HIGH (ref 70–99)
Glucose-Capillary: 167 mg/dL — ABNORMAL HIGH (ref 70–99)
Glucose-Capillary: 184 mg/dL — ABNORMAL HIGH (ref 70–99)

## 2021-03-18 MED ORDER — SPIRONOLACTONE 25 MG PO TABS: 25.0000 mg | ORAL_TABLET | Freq: Every day | ORAL | Status: AC

## 2021-03-18 MED ORDER — ADULT MULTIVITAMIN W/MINERALS CH: 1.0000 | ORAL_TABLET | Freq: Every day | ORAL | Status: AC

## 2021-03-18 MED ORDER — HYDROCERIN EX CREA: 1.0000 "application " | TOPICAL_CREAM | Freq: Two times a day (BID) | CUTANEOUS | 0 refills | Status: AC

## 2021-03-18 MED ORDER — SITAGLIPTIN PHOSPHATE 50 MG PO TABS: 50.0000 mg | ORAL_TABLET | Freq: Every day | ORAL | Status: AC

## 2021-03-18 MED ORDER — ENSURE MAX PROTEIN PO LIQD: 11.0000 [oz_av] | Freq: Every day | ORAL | Status: DC

## 2021-03-18 MED ORDER — INSULIN ASPART 100 UNIT/ML IJ SOLN: 0.0000 [IU] | Freq: Three times a day (TID) | INTRAMUSCULAR | 11 refills | Status: AC

## 2021-03-18 NOTE — Progress Notes (Addendum)
1pm: CSW was notified that patient can discharge to [facility] first thing tomorrow morning.  CSW spoke with patient who is agreeable to discharge plan.  NP made aware of discharge plan.   10am: CSW spoke with Verdon Cummins at [facility] who states there are no beds available today - Verdon Cummins will update CSW with updates as they become available.  Edwin Dada, MSW, LCSW Transitions of Care  Clinical Social Worker II [phone]

## 2021-03-18 NOTE — Progress Notes (Signed)
[facility] PROGRESS NOTE  Pamela [name] GDJ:[id number] DOB: Mar 13, 1956 DOA: 02/08/2021 PCP: Corwin Levins, MD  Status: Remains inpatient appropriate because:Unsafe d/c plan patient is unable to independently stand for more than 1 minute and we are continuing aggressive PT and OT   Dispo: The patient is from: Home              Anticipated d/c is to: [facility] vs SNF              Patient currently is medically stable to d/c.   Difficult to place patient Yes              Barriers to discharge: Primary insurance Rosann Auerbach has denied further inpatient treatments for physical debility.  Patient will become eligible for Medicare A after 10/7 but this does not completely find inpatient rehab services and patient would be responsible for paying the physician portion independent   Level of care: Telemetry Cardiac  Code Status: DNR Family Communication: Patient only DVT prophylaxis: Eliquis COVID vaccination status: Pfizer 01/24/2020, 04/03/2020.  Eligible for booster  HPI: 65 year old with past medical history significant for permanent A. fib on Eliquis, chronic diastolic heart failure, CKD stage IIIa, diabetes type 2, hypertension, hyperlipidemia, depression, anxiety presented to the ED for evaluation of right leg swelling. Recently hospitalized 12/17/2020 until 12/21/2020 for lower GI bleed related to diverticulosis and stercoral ulcer.  She was discharged to SNF.  Patient was sent home from skilled nursing facility on 02/07/2021 as her insurance will not pay for further days, patient continued to have knee and hip problems with generalized debility.  Also she did not get a prescription for Eliquis and had not taken it for 3 days.  On 9/1 she noticed increasing swelling and erythema to her right lower leg and presented for evaluation.   Subjective: Alert and sitting in bed.  Much more encouraged now that she has applied for Medicare A/B and we are just waiting to confirm this is active.  We are  also waiting to confirm that [facility] has a bed available.  Objective: Vitals:   03/17/21 2045 03/18/21 0507  BP: 123/68 120/64  Pulse: 95 94  Resp: 18 18  Temp: 98.2 F (36.8 C) 97.8 F (36.6 C)  SpO2: 90% 92%    Intake/Output Summary (Last 24 hours) at 03/18/2021 0747 Last data filed at 03/18/2021 0650 Gross per 24 hour  Intake 720 ml  Output 3550 ml  Net -2830 ml   Filed Weights   03/16/21 0402 03/17/21 0355 03/18/21 0507  Weight: (!) 149.1 kg (!) 147.9 kg (!) 145.8 kg    Exam: General: Alert, affect more appropriate and much less depressed as compared to Friday Respiratory: Bilateral lung sounds clear to auscultation and remained stable on room air.  No increased work of breathing.  Normal pulse oximetry readings Cardiovascular: Regular pulse, normal heart sounds without rubs murmurs or gallops.  Normotensive.  No peripheral edema. Abdomen:  LBM 10/09 -soft nontender nondistended with normoactive bowel sounds.   Neurologic: CN 2-12 grossly intact. Sensation intact, DTR normal. Strength 4/5 x all 4 extremities while in bed marked decrease strength with ambulation.  Also has significant decreased upper extremity strength with activities at about 2-3/5 Psychiatric: Normal judgment/insight. Alert and oriented x 3.  Slightly depressed affect   Assessment/Plan: Acute problems: Cellulitis of the right lower extremity age indeterminate right TM DVT: Continue Eliquis Completed 10 days of antibiotics    Permanent A. fib:  Continue Eliquis,  diltiazem and Toprol-rate controlled  Recurrent hypokalemia Continue Aldactone, Lasix and potassium supplementation Potassium on 10/3 was 3.7 and on 10/5 potassium was 4.0  Recurrent seasonal allergies Continue Zyrtec and Pepcid   General debility, chronic bilateral knee pain, right hip pain: Peer-to-peer completed on 9/30 was denied by the physician reviewer  Making slow improvements with therapies and  recommendation is for inpatient rehabilitation-awaiting confirmation of funding and bed availability Lives alone but will have 3 brothers who can intermittently come by and assist with her care after discharge  Obesity Body mass index is 43.59 kg/m.  Degree of obesity in context of underlying osteoarthritis has contributed to patient's severe debility and profound physical deconditioning Will obtain nutrition consult for weight loss education  Mild renal insufficiency Peak creatinine 1.42 with a GFR of 41 on 9/5 and now back to baseline Baseline creatinine 0.89 through 1.24 with average GFR greater than 6 Creatinine stable with addition of Aldactone.  On 10/2 creatinine was 1.65 but on 10/4 had decreased to 1.43  Chronic lower extremity edema Continue Lasix with Aldactone Renal function stable thus far with resumption of Jardiance   Diabetic neuropathy:  Continue with Lyrica   Type II Diabetes:  Patient was on Jardiance, Actos and glipizide prior to admission but was not on insulin. Actos discontinued secondary to edema and history of heart failure. Metformin resumed Glipizide discontinued in favor of Tradjenta Continue SSI and meal coverage while hospitalized GFR has recovered-HgbA1c June 2022 was 6.9  Nutrition BMI elevated without any evidence of malnutrition Continue carbohydrate modified diet Body mass index is 43.59 kg/m.    Stage II right buttock decubitus Wound / Incision (Open or Dehisced) 02/09/21 Non-pressure wound Tibial Posterior;Proximal;Right Open blister secondary to cellulitis (Active)  Date First Assessed/Time First Assessed: 02/09/21 1321   Wound Type: Non-pressure wound  Location: Tibial  Location Orientation: Posterior;Proximal;Right  Wound Description (Comments): Open blister secondary to cellulitis  Present on Admission: Yes    Assessments 02/09/2021  1:00 PM 03/16/2021  8:00 AM  Dressing Type Foam - Lift dressing to assess site every shift None  Dressing  Changed New --  Dressing Status Dry;Intact;Clean --  Dressing Change Frequency PRN --  Site / Wound Assessment -- Dry;Pink  Peri-wound Assessment -- Intact  Wound Length (cm) 4 cm --  Wound Width (cm) 5 cm --  Wound Depth (cm) 0 cm --  Wound Volume (cm^3) 0 cm^3 --  Wound Surface Area (cm^2) 20 cm^2 --  Closure -- Approximated  Drainage Amount Minimal --  Drainage Description Sanguineous;No odor --     No Linked orders to display     Wound / Incision (Open or Dehisced) 02/09/21 (ITD) Intertriginous Dermatitis Buttocks Circumferential red and raised rash (Active)  Date First Assessed/Time First Assessed: 02/09/21 1323   Wound Type: (ITD) Intertriginous Dermatitis  Location: (c) Buttocks  Location Orientation: Circumferential  Wound Description (Comments): red and raised rash  Present on Admission: Yes    Assessments 02/09/2021  1:00 PM 03/16/2021  8:00 AM  Dressing Type Foam - Lift dressing to assess site every shift Other (Comment)  Dressing Changed New --  Dressing Status Dry;Intact;Clean --  Dressing Change Frequency PRN --  Site / Wound Assessment -- Red  Peri-wound Assessment -- Intact  Drainage Amount None --  Treatment Cleansed --     No Linked orders to display     Pressure Injury 02/09/21 Buttocks Right Stage 2 -  Partial thickness loss of dermis presenting as a shallow open injury  with a red, pink wound bed without slough. (Active)  Date First Assessed/Time First Assessed: 02/09/21 2059   Location: Buttocks  Location Orientation: Right  Staging: Stage 2 -  Partial thickness loss of dermis presenting as a shallow open injury with a red, pink wound bed without slough.  Present on A...    Assessments 02/09/2021  8:59 PM 03/17/2021  9:17 PM  Dressing Type Foam - Lift dressing to assess site every shift Other (Comment)  Dressing -- Clean;Dry  Dressing Change Frequency -- PRN     No Linked orders to display      Data Reviewed: Basic Metabolic Panel: Recent Labs  Lab  03/13/21 0323 03/17/21 1434  NA 137 137  K 4.0 4.1  CL 99 99  CO2 27 26  GLUCOSE 129* 148*  BUN 30* 26*  CREATININE 1.43* 1.65*  CALCIUM 9.4 9.6    CBC: No results for input(s): WBC, NEUTROABS, HGB, HCT, MCV, PLT in the last 168 hours.    BNP (last 3 results) Recent Labs    02/08/21 2113 03/03/21 0251  BNP 210.6* 92.4    ProBNP (last 3 results) Recent Labs    03/20/20 1145 06/21/20 1129  PROBNP 125.0* 139.0*    CBG: Recent Labs  Lab 03/17/21 0728 03/17/21 1127 03/17/21 1645 03/17/21 2046 03/18/21 0553  GLUCAP 156* 171* 173* 189* 162*     Scheduled Meds:  apixaban  5 mg Oral BID   atorvastatin  40 mg Oral Daily   diltiazem  360 mg Oral Daily   empagliflozin  25 mg Oral Daily   famotidine  20 mg Oral Daily   ferrous sulfate  325 mg Oral Q24H   furosemide  40 mg Oral BID   Gerhardt's butt cream   Topical BID   hydrocerin   Topical BID   insulin aspart  0-9 Units Subcutaneous TID WC   insulin aspart  3 Units Subcutaneous TID WC   linagliptin  5 mg Oral Daily   loratadine  10 mg Oral Daily   metFORMIN  500 mg Oral BID WC   metoprolol succinate  100 mg Oral Daily   multivitamin with minerals  1 tablet Oral Daily   potassium chloride  40 mEq Oral BID   pregabalin  50 mg Oral TID   Ensure Max Protein  11 oz Oral QHS   spironolactone  25 mg Oral Daily    Principal Problem:   Cellulitis of right lower extremity Active Problems:   Type 2 diabetes mellitus (HCC)   Hypertension associated with diabetes (HCC)   CKD (chronic kidney disease) stage 3, GFR 30-59 ml/min (HCC)   Chronic atrial fibrillation (HCC)   Chronic diastolic heart failure (HCC)   Hyperlipidemia associated with type 2 diabetes mellitus (HCC)   Pressure injury of skin   Consultants: None  Procedures: None  Antibiotics: IV cefazolin 9/2 through 9/6 PO cephalexin 9/7 through 9/12 PO doxycycline 9/8 through 9/12  Time spent: 15 minutes    Junious Silk ANP  [facility] 7 am - 330 pm/M-F for direct patient care and secure chat Please refer to Amion for contact info 34  days

## 2021-03-18 NOTE — Progress Notes (Signed)
Occupational Therapy Treatment Patient Details Name: Pamela [name] MRN: [id number] DOB: 02/13/1956 Today's Date: 03/18/2021   History of present illness Pt is a 65 y.o. female admitted 02/08/21 with RLE swelling and erythema. Workup for RLE cellulitis, age-indeterminate RLE DVT. CXR with cardiomegaly, vascular congestion. PMH includes anxiety, depression, afib, CHF, DM2, HTN, rosacea, migraines, R knee DJD. Of note, admission 7/11-7/15/22 for GIB with discharge to SNF with recent return home 02/07/21.   OT comments  This 65 yo female seen today with hopes of getting out of bed to recliner with lateral transfer, but pt reported she did not want to do it because the chair is not comfortable to her. So plan was changed to Bil UE theraband exercises level 4.   Recommendations for follow up therapy are one component of a multi-disciplinary discharge planning process, led by the attending physician.  Recommendations may be updated based on patient status, additional functional criteria and insurance authorization.    Follow Up Recommendations  CIR    Equipment Recommendations  Wheelchair (measurements OT);Wheelchair cushion (measurements OT)    Recommendations for Other Services      Precautions / Restrictions Precautions Precautions: Fall Precaution Comments: Bladder/bowel incontinence Restrictions Weight Bearing Restrictions: No                Vision Patient Visual Report: No change from baseline            Cognition Arousal/Alertness: Awake/alert Behavior During Therapy: WFL for tasks assessed/performed Overall Cognitive Status: Within Functional Limits for tasks assessed                                         Exercises Other Exercises Other Exercises: Supine in bed pt performed Bil UEs 10 reps of horizontal abduction/adduction, bicep curl, tricep extension, and chest press with level 4 theraband (blue) Other Exercises: B LE exercises: seated: LAQ,  marching x15 reps. Supine: heel slides, SLR, hip abd.add x 10 reps each ( increased difficulty on right side vs left side.           Pertinent Vitals/ Pain       Pain Assessment: No/denies pain Faces Pain Scale: No hurt         Frequency  Min 2X/week        Progress Toward Goals  OT Goals(current goals can now be found in the care plan section)  Progress towards OT goals: Progressing toward goals  Acute Rehab OT Goals Patient Stated Goal: to go to rehab tomorrow OT Goal Formulation: With patient Time For Goal Achievement: 03/25/21 Potential to Achieve Goals: Good  Plan Discharge plan remains appropriate    AM-PAC OT "6 Clicks" Daily Activity     Outcome Measure   Help from another person eating meals?: None Help from another person taking care of personal grooming?: A Little Help from another person toileting, which includes using toliet, bedpan, or urinal?: A Lot Help from another person bathing (including washing, rinsing, drying)?: A Lot Help from another person to put on and taking off regular upper body clothing?: A Little Help from another person to put on and taking off regular lower body clothing?: A Lot 6 Click Score: 16    End of Session    OT Visit Diagnosis: Muscle weakness (generalized) (M62.81)   Activity Tolerance Patient tolerated treatment well   Patient Left in bed;with call bell/phone within reach  Time: 4742-5956 OT Time Calculation (min): 13 min  Charges: OT General Charges $OT Visit: 1 Visit OT Treatments $Therapeutic Exercise: 8-22 mins  Ignacia Palma, OTR/L Acute [employer] Pager [phone] Office [phone]    Evette Georges 03/18/2021, 4:24 PM

## 2021-03-18 NOTE — Progress Notes (Signed)
Physical Therapy Treatment Patient Details Name: CHISOM MUNTEAN MRN: [id number] DOB: 01/22/1956 Today's Date: 03/18/2021   History of Present Illness Pt is a 65 y.o. female admitted 02/08/21 with RLE swelling and erythema. Workup for RLE cellulitis, age-indeterminate RLE DVT. CXR with cardiomegaly, vascular congestion. PMH includes anxiety, depression, afib, CHF, DM2, HTN, rosacea, migraines, R knee DJD. Of note, admission 7/11-7/15/22 for GIB with discharge to SNF with recent return home 02/07/21.    PT Comments    Patient received in bed, agrees to PT session. She reports no pain this date.  Performed bed mobility with mod independence. Performed partial transfer sit to stand with +2 max assist from elevated surface pulling up from back of recliner and is only able to clear bottom about 2 inches x 5 reps. She is able to scoot in sitting along edge of bed and performed LE exercises. Patient will continue to benefit from skilled PT while here to improve functional independence.      Recommendations for follow up therapy are one component of a multi-disciplinary discharge planning process, led by the attending physician.  Recommendations may be updated based on patient status, additional functional criteria and insurance authorization.  Follow Up Recommendations  CIR;Supervision for mobility/OOB     Equipment Recommendations  Other (comment) (TBD)    Recommendations for Other Services       Precautions / Restrictions Precautions Precautions: Fall Precaution Comments: Bladder/bowel incontinence Restrictions Weight Bearing Restrictions: No     Mobility  Bed Mobility Overal bed mobility: Modified Independent Bed Mobility: Supine to Sit;Sit to Supine     Supine to sit: Modified independent (Device/Increase time) Sit to supine: Modified independent (Device/Increase time)   General bed mobility comments: bed rails, no physical assist provided    Transfers Overall transfer level:  Needs assistance Equipment used:  (used back of recliner to pull up on) Transfers: Sit to/from Stand Sit to Stand: From elevated surface;Max assist;+2 physical assistance;+2 safety/equipment         General transfer comment: Patient performed 5 reps of STS from elevated bed pulling on back of recliner with +2 max assist. She is barely able to clear bottom from bed with each attempt.  Ambulation/Gait             General Gait Details: unable   Stairs             Wheelchair Mobility    Modified Rankin (Stroke Patients Only)       Balance Overall balance assessment: Needs assistance Sitting-balance support: Feet supported Sitting balance-[name]: Good     Standing balance support: Bilateral upper extremity supported;During functional activity Standing balance-[name]: Poor Standing balance comment: MaxAx2 with use of bed pad to extend hips to stand with pt pulling on posterior aspect of recliner. Even with this she is unable to clear bottom much from bed  ( maybe 2 inches)                            Cognition Arousal/Alertness: Awake/alert Behavior During Therapy: WFL for tasks assessed/performed Overall Cognitive Status: Within Functional Limits for tasks assessed                                 General Comments: pleasant and agreeable to participate in session. Very motivated.      Exercises Other Exercises Other Exercises: B LE exercises: seated: LAQ, marching  x15 reps. Supine: heel slides, SLR, hip abd.add x 10 reps each ( increased difficulty on right side vs left side.    General Comments        Pertinent Vitals/Pain Pain Assessment: No/denies pain    Home Living                      Prior Function            PT Goals (current goals can now be found in the care plan section) Acute Rehab PT Goals Patient Stated Goal: to stand better and go to inpt rehab PT Goal Formulation: With patient Time For  Goal Achievement: 03/25/21 Potential to Achieve Goals: Fair Additional Goals Additional Goal #1: Pt will be able to transfer to wheelchair with min guard and propell herself 500 feet. Progress towards PT goals: Progressing toward goals    Frequency    Min 3X/week      PT Plan Current plan remains appropriate    Co-evaluation              AM-PAC PT "6 Clicks" Mobility   Outcome Measure  Help needed turning from your back to your side while in a flat bed without using bedrails?: A Little Help needed moving from lying on your back to sitting on the side of a flat bed without using bedrails?: A Little Help needed moving to and from a bed to a chair (including a wheelchair)?: A Lot Help needed standing up from a chair using your arms (e.g., wheelchair or bedside chair)?: Total Help needed to walk in hospital room?: Total Help needed climbing 3-5 steps with a railing? : Total 6 Click Score: 11    End of Session Equipment Utilized During Treatment: Gait belt Activity Tolerance: Patient tolerated treatment well Patient left: in bed;with call bell/phone within reach Nurse Communication: Mobility status PT Visit Diagnosis: Muscle weakness (generalized) (M62.81);Other abnormalities of gait and mobility (R26.89)     Time: 1300-1325 PT Time Calculation (min) (ACUTE ONLY): 25 min  Charges:  $Therapeutic Exercise: 8-22 mins $Therapeutic Activity: 8-22 mins                     Merrel Crabbe, PT, GCS 03/18/21,1:38 PM

## 2021-03-19 LAB — GLUCOSE, CAPILLARY: Glucose-Capillary: 189 mg/dL — ABNORMAL HIGH (ref 70–99)

## 2021-03-19 NOTE — Progress Notes (Addendum)
Patient will be discharged to [facility] via [facility] - transportation has been scheduled for pick up at 10:30am.  The number to call for report is [phone].  CSW met with patient at bedside to discuss discharge plan - she is agreeable and expressed gratitude for TOC involvement.  Madilyn Fireman, MSW, LCSW Transitions of Care  Clinical Social Worker II [phone]

## 2021-03-19 NOTE — Plan of Care (Signed)
Problem: Education: Goal: Knowledge of General Education information will improve Description: Including pain rating scale, medication(s)/side effects and non-pharmacologic comfort measures 03/19/2021 1015 by Sonny Masters, RN Outcome: Adequate for Discharge 03/19/2021 1015 by Sonny Masters, RN Outcome: Adequate for Discharge   Problem: Health Behavior/Discharge Planning: Goal: Ability to manage health-related needs will improve 03/19/2021 1015 by Sonny Masters, RN Outcome: Adequate for Discharge 03/19/2021 1015 by Sonny Masters, RN Outcome: Adequate for Discharge   Problem: Clinical Measurements: Goal: Ability to maintain clinical measurements within normal limits will improve 03/19/2021 1015 by Sonny Masters, RN Outcome: Adequate for Discharge 03/19/2021 1015 by Sonny Masters, RN Outcome: Adequate for Discharge Goal: Will remain free from infection 03/19/2021 1015 by Sonny Masters, RN Outcome: Adequate for Discharge 03/19/2021 1015 by Sonny Masters, RN Outcome: Adequate for Discharge Goal: Diagnostic test results will improve 03/19/2021 1015 by Sonny Masters, RN Outcome: Adequate for Discharge 03/19/2021 1015 by Sonny Masters, RN Outcome: Adequate for Discharge Goal: Respiratory complications will improve 03/19/2021 1015 by Sonny Masters, RN Outcome: Adequate for Discharge 03/19/2021 1015 by Sonny Masters, RN Outcome: Adequate for Discharge Goal: Cardiovascular complication will be avoided 03/19/2021 1015 by Sonny Masters, RN Outcome: Adequate for Discharge 03/19/2021 1015 by Sonny Masters, RN Outcome: Adequate for Discharge   Problem: Activity: Goal: Risk for activity intolerance will decrease 03/19/2021 1015 by Sonny Masters, RN Outcome: Adequate for Discharge 03/19/2021 1015 by Sonny Masters, RN Outcome: Adequate for Discharge   Problem: Nutrition: Goal: Adequate nutrition will be maintained 03/19/2021 1015 by Sonny Masters, RN Outcome: Adequate for  Discharge 03/19/2021 1015 by Sonny Masters, RN Outcome: Adequate for Discharge   Problem: Coping: Goal: Level of anxiety will decrease 03/19/2021 1015 by Sonny Masters, RN Outcome: Adequate for Discharge 03/19/2021 1015 by Sonny Masters, RN Outcome: Adequate for Discharge   Problem: Elimination: Goal: Will not experience complications related to bowel motility 03/19/2021 1015 by Sonny Masters, RN Outcome: Adequate for Discharge 03/19/2021 1015 by Sonny Masters, RN Outcome: Adequate for Discharge Goal: Will not experience complications related to urinary retention 03/19/2021 1015 by Sonny Masters, RN Outcome: Adequate for Discharge 03/19/2021 1015 by Sonny Masters, RN Outcome: Adequate for Discharge   Problem: Pain Managment: Goal: General experience of comfort will improve 03/19/2021 1015 by Sonny Masters, RN Outcome: Adequate for Discharge 03/19/2021 1015 by Sonny Masters, RN Outcome: Adequate for Discharge   Problem: Safety: Goal: Ability to remain free from injury will improve 03/19/2021 1015 by Sonny Masters, RN Outcome: Adequate for Discharge 03/19/2021 1015 by Sonny Masters, RN Outcome: Adequate for Discharge   Problem: Skin Integrity: Goal: Risk for impaired skin integrity will decrease 03/19/2021 1015 by Sonny Masters, RN Outcome: Adequate for Discharge 03/19/2021 1015 by Sonny Masters, RN Outcome: Adequate for Discharge   Problem: Education: Goal: Ability to demonstrate management of disease process will improve 03/19/2021 1015 by Sonny Masters, RN Outcome: Adequate for Discharge 03/19/2021 1015 by Sonny Masters, RN Outcome: Adequate for Discharge Goal: Ability to verbalize understanding of medication therapies will improve 03/19/2021 1015 by Sonny Masters, RN Outcome: Adequate for Discharge 03/19/2021 1015 by Sonny Masters, RN Outcome: Adequate for Discharge Goal: Individualized Educational Video(s) 03/19/2021 1015 by Sonny Masters, RN Outcome:  Adequate for Discharge 03/19/2021 1015 by Sonny Masters, RN Outcome: Adequate for Discharge   Problem: Activity: Goal: Capacity to carry out  activities will improve 03/19/2021 1015 by Sonny Masters, RN Outcome: Adequate for Discharge 03/19/2021 1015 by Sonny Masters, RN Outcome: Adequate for Discharge   Problem: Cardiac: Goal: Ability to achieve and maintain adequate cardiopulmonary perfusion will improve 03/19/2021 1015 by Sonny Masters, RN Outcome: Adequate for Discharge 03/19/2021 1015 by Sonny Masters, RN Outcome: Adequate for Discharge   Problem: Clinical Measurements: Goal: Ability to avoid or minimize complications of infection will improve 03/19/2021 1015 by Sonny Masters, RN Outcome: Adequate for Discharge 03/19/2021 1015 by Sonny Masters, RN Outcome: Adequate for Discharge   Problem: Skin Integrity: Goal: Skin integrity will improve 03/19/2021 1015 by Sonny Masters, RN Outcome: Adequate for Discharge 03/19/2021 1015 by Sonny Masters, RN Outcome: Adequate for Discharge

## 2021-03-19 NOTE — Progress Notes (Signed)
Nutrition Follow-up  DOCUMENTATION CODES:   Morbid obesity  INTERVENTION:   -Continue MVI with minerals daily -Continue Ensure Max po BID, each supplement provides 150 kcal and 30 grams of protein  NUTRITION DIAGNOSIS:   Increased nutrient needs related to wound healing as evidenced by estimated needs.  Ongoing  GOAL:   Patient will meet greater than or equal to 90% of their needs  Progressing  MONITOR:   PO intake, Supplement acceptance, Diet advancement, Labs, Weight trends, Skin, I & O's  REASON FOR ASSESSMENT:   Consult Assessment of nutrition requirement/status  ASSESSMENT:   65 year old with past medical history significant for permanent A. fib on Eliquis, chronic diastolic heart failure, CKD stage IIIa, diabetes type 2, hypertension, hyperlipidemia, depression, anxiety presented to the ED for evaluation of right leg swelling.  10/7- IV removed  Reviewed I/O's: -2 L x 24 hours and -28.3 L since 03/05/21  UOP: 2.5 L x 24 hours  Per TOC notes, pt will discharge to [facility] today. She now has Medicare A and B.   Pt remains with good appetite. Noted meal completion 100%. Pt continues to take Ensure Max supplements.   Obesity is a complex, chronic medical condition that is optimally managed by a multidisciplinary care team. Weight loss is not an ideal goal for an acute inpatient hospitalization. However, if further work-up for obesity is warranted, consider outpatient referral to outpatient bariatric service and/or [facility].     Pt also with increased nutritional needs secondary to acute illness as well as wound healing. Optimizing pt's nutritional status to target wound healing and preservation of lean body mass will assist with pt's debility; RD to continue low carbohydrate, high protein nutritional supplements to help pt meet her nutritional goals.    Medications reviewed and include cardizem, ferrous  sulfate, lasix, and potassium chloride.   Labs reviewed: CBGS: 160-189 (inpatient orders for glycemic control are 25 mg empagliflozin daily, 0-9 units insulin aspart TID with meals, 3 units insulin aspart TID with meals, 5 mg linagliptin daily, and 500 mg metformin BID).    Diet Order:   Diet Order             Diet - low sodium heart healthy           Diet Carb Modified           Diet heart healthy/carb modified Room service appropriate? Yes; Fluid consistency: Thin; Fluid restriction: 1500 mL Fluid  Diet effective now           Diet - low sodium heart healthy                   EDUCATION NEEDS:   Education needs have been addressed  Skin:  Skin Assessment: Skin Integrity Issues: Skin Integrity Issues:: Stage II, Other (Comment) Stage II: rt buttocks Other: ITD buttocks, non-pressure wound to rt tibia  Last BM:  03/19/21  Height:   Ht Readings from Last 1 Encounters:  02/09/21 6' (1.829 m)    Weight:   Wt Readings from Last 1 Encounters:  03/19/21 (!) 146.8 kg    Ideal Body Weight:  72.7 kg  BMI:  Body mass index is 43.89 kg/m.  Estimated Nutritional Needs:   Kcal:  2200-2400  Protein:  130-145 grams  Fluid:  > 2 L    Levada Schilling, RD, LDN, CDCES Registered Dietitian II Certified Diabetes Care and Education Specialist Please refer to [facility] for RD and/or RD  on-call/weekend/after hours pager

## 2021-03-19 NOTE — Discharge Summary (Signed)
Physician Discharge Summary  Pamela [name] TKW:[id number] DOB: [birth year] DOA: 02/08/2021  PCP: Corwin Levins, MD  Admit date: 02/08/2021 Discharge date: 03/19/2021  Time spent: 35 minutes  Recommendations for Outpatient Follow-up:  Patient will discharge to [facility] inpatient rehabilitation Patient will need to continue aggressive PT and OT with expectation of being able to discharge home with minimal to moderate assistance from family members Has a history of chronic hypokalemia which has improved with the addition of Aldactone.  Recommendation is to check electrolytes periodically during rehabilitation stay. She has had multiple adjustments in her diabetic medications during this hospitalization, some of which may require insurance preauthorization prior to discharge from inpatient rehabilitation Recommend checking glucometer readings AC/HS-SSI ordered and discharge medications as well as meal coverage.   Discharge Diagnoses:  Principal Problem:   Cellulitis of right lower extremity Active Problems:   Type 2 diabetes mellitus (HCC)   Hypertension associated with diabetes (HCC)   CKD (chronic kidney disease) stage 3, GFR 30-59 ml/min (HCC)   Chronic atrial fibrillation (HCC)   Chronic diastolic heart failure (HCC)   Hyperlipidemia associated with type 2 diabetes mellitus (HCC)   Pressure injury of skin  CODE STATUS: DNR  Discharge Condition: Stable  Diet recommendation: Carbohydrate modified  Filed Weights   03/17/21 0355 03/18/21 0507 03/19/21 0321  Weight: (!) 147.9 kg (!) 145.8 kg (!) 146.8 kg    History of present illness:  65 year old with past medical history significant for permanent A. fib on Eliquis, chronic diastolic heart failure, CKD stage IIIa, diabetes type 2, hypertension, hyperlipidemia, depression, anxiety presented to the ED for evaluation of right leg swelling. Recently hospitalized 12/17/2020 until 12/21/2020 for lower GI bleed related to diverticulosis  and stercoral ulcer.  She was discharged to SNF.  Patient was sent home from skilled nursing facility on 02/07/2021 as her insurance will not pay for further days, patient continued to have knee and hip problems with generalized debility.  Also she did not get a prescription for Eliquis and had not taken it for 3 days.  On 9/1 she noticed increasing swelling and erythema to her right lower leg and presented for evaluation.  Hospital Course:  Cellulitis of the right lower extremity 65 indeterminate right TM DVT: Continue Eliquis Completed 10 days of antibiotics    Permanent A. fib:  Continue Eliquis, diltiazem and Toprol-rate controlled   Recurrent hypokalemia Continue Aldactone, Lasix and potassium supplementation 10/9 K+ was 4.1   Recurrent seasonal allergies Continue Zyrtec and Pepcid   General debility, chronic bilateral knee pain, right hip pain: Making slow improvements with therapies and recommendation is for inpatient rehabilitation Lives alone but will have 3 brothers who can intermittently come by and assist with her care after discharge   Obesity Body mass index is 43.59 kg/m.  Degree of obesity in context of underlying osteoarthritis has contributed to patient's severe debility and profound physical deconditioning Nutrition consulted during this admission for diet and weight loss education   Mild renal insufficiency Baseline creatinine 0.89 through 1.24 with average GFR greater than 60 10/9 Scr was 1.65 with the addition of Aldactone to Lasix   Chronic lower extremity edema Continue Lasix with Aldactone   Diabetic neuropathy:  Continue  Lyrica   Type II Diabetes:  Patient was on Jardiance, Actos and glipizide prior to admission but was not on insulin. Actos discontinued secondary to edema and history of heart failure. Metformin resumed Glipizide discontinued in favor of Tradjenta-not covered by insurance and instead will discharge  on Januvia Continue SSI and meal  coverage while hospitalized GFR has recovered-HgbA1c June 2022 was 6.9   Nutrition BMI elevated without any evidence of malnutrition Continue carbohydrate modified diet Body mass index is 43.59 kg/m.     Stage II right buttock decubitus Resolved          Procedures: None  Consultations: None  Antibiotics: IV cefazolin 9/2 through 9/6 PO cephalexin 9/7 through 9/12 PO doxycycline 9/8 through 9/12    Discharge Exam: Vitals:   03/18/21 1134 03/19/21 0321  BP: 121/74 118/70  Pulse: 96 97  Resp:  18  Temp: 98 F (36.7 C) 98.1 F (36.7 C)  SpO2: 94% 94%   General: Alert, affect bright Respiratory: Bilateral lung sounds clear to auscultation and remained stable on room air.  No increased work of breathing.  Normal pulse oximetry readings Cardiovascular: Regular pulse, normal heart sounds without rubs murmurs or gallops.  Normotensive.  No peripheral edema. Abdomen:  LBM 10/11 -soft nontender nondistended with normoactive bowel sounds.   Neurologic: CN 2-12 grossly intact. Sensation intact, DTR normal. Strength 4/5 x all 4 extremities while in bed marked decrease strength with ambulation.  Also has significant decreased upper extremity strength with activities at about 2-3/5 Psychiatric: Normal judgment/insight. Alert and oriented x 3.    Discharge Instructions   Discharge Instructions     Diet - low sodium heart healthy   Complete by: As directed    Diet - low sodium heart healthy   Complete by: As directed    Diet Carb Modified   Complete by: As directed    Discharge wound care:   Complete by: As directed    See above   Increase activity slowly   Complete by: As directed    Increase activity slowly   Complete by: As directed    No wound care   Complete by: As directed       Allergies as of 03/19/2021       Reactions   Cymbalta [duloxetine Hcl] Other (See Comments)   "loopiness"   Gabapentin Itching        Medication List     STOP taking  these medications    glipiZIDE 10 MG 24 hr tablet Commonly known as: GLUCOTROL XL   losartan 100 MG tablet Commonly known as: COZAAR   pioglitazone 30 MG tablet Commonly known as: Actos       TAKE these medications    acetaminophen 325 MG tablet Commonly known as: TYLENOL Take 2 tablets (650 mg total) by mouth every 6 (six) hours as needed for mild pain (or Fever >/= 101). What changed: reasons to take this   albuterol 108 (90 Base) MCG/ACT inhaler Commonly known as: VENTOLIN HFA Inhale 2 puffs into the lungs every 6 (six) hours as needed for wheezing or shortness of breath.   apixaban 5 MG Tabs tablet Commonly known as: Eliquis Take 1 tablet (5 mg total) by mouth 2 (two) times daily.   atorvastatin 40 MG tablet Commonly known as: LIPITOR Take 1 tablet (40 mg total) by mouth daily.   cetirizine 10 MG tablet Commonly known as: ZYRTEC 1 tab by mouth once daily as needed What changed:  how much to take how to take this when to take this reasons to take this additional instructions   diltiazem 360 MG 24 hr capsule Commonly known as: CARDIZEM CD Take 1 capsule (360 mg total) by mouth daily.   empagliflozin 25 MG Tabs tablet Commonly known as: JARDIANCE  Take 1 tablet (25 mg total) by mouth daily.   Ensure Max Protein Liqd Take 330 mLs (11 oz total) by mouth at bedtime.   famotidine 20 MG tablet Commonly known as: PEPCID Take 1 tablet (20 mg total) by mouth daily.   ferrous sulfate 325 (65 FE) MG tablet Take 1 tablet (325 mg total) by mouth daily.   Fish Oil 1000 MG Caps Take 2 capsules (2,000 mg total) by mouth 2 (two) times daily.   furosemide 40 MG tablet Commonly known as: LASIX Take 1 tablet (40 mg total) by mouth 2 (two) times daily. What changed:  when to take this additional instructions   hydrocerin Crea Apply 1 application topically 2 (two) times daily.   insulin aspart 100 UNIT/ML injection Commonly known as: novoLOG Inject 3 Units into  the skin 3 (three) times daily with meals.   insulin aspart 100 UNIT/ML injection Commonly known as: novoLOG Inject 0-9 Units into the skin 3 (three) times daily with meals. Do NOT hold if patient is NPO. Sensitive Scale.   insulin glargine-yfgn 100 UNIT/ML injection Commonly known as: SEMGLEE Inject 0.25 mLs (25 Units total) into the skin daily.   meclizine 12.5 MG tablet Commonly known as: ANTIVERT TAKE 1 TABLET(12.5 MG) BY MOUTH THREE TIMES DAILY AS NEEDED FOR DIZZINESS What changed: See the new instructions.   metFORMIN 500 MG tablet Commonly known as: GLUCOPHAGE Take 1 tablet (500 mg total) by mouth 2 (two) times daily with a meal.   metoprolol succinate 100 MG 24 hr tablet Commonly known as: TOPROL-XL Take 1 tablet (100 mg total) by mouth daily. Take with or immediately following a meal.   multivitamin with minerals Tabs tablet Take 1 tablet by mouth daily.   polyethylene glycol 17 g packet Commonly known as: MIRALAX / GLYCOLAX Take 17 g by mouth 2 (two) times daily. What changed:  when to take this reasons to take this   potassium chloride SA 20 MEQ tablet Commonly known as: KLOR-CON Take 2 tablets (40 mEq total) by mouth 2 (two) times daily.   pregabalin 50 MG capsule Commonly known as: LYRICA TAKE 1 CAPSULE(50 MG) BY MOUTH THREE TIMES DAILY What changed:  how much to take how to take this when to take this additional instructions   senna-docusate 8.6-50 MG tablet Commonly known as: Senokot-S Take 1 tablet by mouth 2 (two) times daily as needed for moderate constipation.   sitaGLIPtin 50 MG tablet Commonly known as: Januvia Take 1 tablet (50 mg total) by mouth daily.   spironolactone 25 MG tablet Commonly known as: ALDACTONE Take 1 tablet (25 mg total) by mouth daily.               Discharge Care Instructions  (From admission, onward)           Start     Ordered   02/18/21 0000  Discharge wound care:       Comments: See above    02/18/21 1257           Allergies  Allergen Reactions   Cymbalta [Duloxetine Hcl] Other (See Comments)    "loopiness"   Gabapentin Itching      The results of significant diagnostics from this hospitalization (including imaging, microbiology, ancillary and laboratory) are listed below for reference.    Significant Diagnostic Studies: No results found.  Microbiology: No results found for this or any previous visit (from the past 240 hour(s)).   Labs: Basic Metabolic Panel: Recent Labs  Lab  03/13/21 0323 03/17/21 1434  NA 137 137  K 4.0 4.1  CL 99 99  CO2 27 26  GLUCOSE 129* 148*  BUN 30* 26*  CREATININE 1.43* 1.65*  CALCIUM 9.4 9.6   Liver Function Tests: No results for input(s): AST, ALT, ALKPHOS, BILITOT, PROT, ALBUMIN in the last 168 hours. No results for input(s): LIPASE, AMYLASE in the last 168 hours. No results for input(s): AMMONIA in the last 168 hours. CBC: No results for input(s): WBC, NEUTROABS, HGB, HCT, MCV, PLT in the last 168 hours. Cardiac Enzymes: No results for input(s): CKTOTAL, CKMB, CKMBINDEX, TROPONINI in the last 168 hours. BNP: BNP (last 3 results) Recent Labs    02/08/21 2113 03/03/21 0251  BNP 210.6* 92.4    ProBNP (last 3 results) Recent Labs    03/20/20 1145 06/21/20 1129  PROBNP 125.0* 139.0*    CBG: Recent Labs  Lab 03/18/21 0553 03/18/21 1133 03/18/21 1536 03/18/21 2013 03/19/21 0536  GLUCAP 162* 160* 167* 184* 189*       Signed:  Junious Silk ANP [facility] 03/19/2021, 9:34 AM

## 2021-03-20 ENCOUNTER — Telehealth: Payer: Self-pay

## 2021-03-20 NOTE — Telephone Encounter (Signed)
Transition Care Management Unsuccessful Follow-up Telephone Call  Date of discharge and from where:  03/19/2021 from [facility]  Attempts:  1st Attempt  Reason for unsuccessful TCM follow-up call:  Spoke with patient, she was discharged home, then was admitted to [facility] for therapy.  She will schedule an appointment with Dr. [name] then.

## 2021-04-05 ENCOUNTER — Emergency Department (HOSPITAL_COMMUNITY)
Admission: EM | Admit: 2021-04-05 | Discharge: 2021-04-08 | Disposition: A | Discharge status: Home / Self Care | Payer: Managed Care, Other (non HMO) | Attending: Emergency Medicine | Admitting: Emergency Medicine

## 2021-04-05 ENCOUNTER — Telehealth (INDEPENDENT_AMBULATORY_CARE_PROVIDER_SITE_OTHER): Payer: Managed Care, Other (non HMO) | Admitting: Internal Medicine

## 2021-04-05 ENCOUNTER — Telehealth: Payer: Self-pay | Admitting: Internal Medicine

## 2021-04-05 ENCOUNTER — Encounter (HOSPITAL_COMMUNITY): Payer: Self-pay | Admitting: *Deleted

## 2021-04-05 ENCOUNTER — Other Ambulatory Visit: Payer: Self-pay

## 2021-04-05 DIAGNOSIS — N183 Chronic kidney disease, stage 3 unspecified: Secondary | ICD-10-CM | POA: Diagnosis not present

## 2021-04-05 DIAGNOSIS — R0602 Shortness of breath: Secondary | ICD-10-CM | POA: Insufficient documentation

## 2021-04-05 DIAGNOSIS — M7989 Other specified soft tissue disorders: Secondary | ICD-10-CM | POA: Diagnosis not present

## 2021-04-05 DIAGNOSIS — Z79899 Other long term (current) drug therapy: Secondary | ICD-10-CM | POA: Insufficient documentation

## 2021-04-05 DIAGNOSIS — Z20822 Contact with and (suspected) exposure to covid-19: Secondary | ICD-10-CM | POA: Diagnosis not present

## 2021-04-05 DIAGNOSIS — R5381 Other malaise: Secondary | ICD-10-CM | POA: Diagnosis not present

## 2021-04-05 DIAGNOSIS — E11610 Type 2 diabetes mellitus with diabetic neuropathic arthropathy: Secondary | ICD-10-CM | POA: Diagnosis not present

## 2021-04-05 DIAGNOSIS — Z7901 Long term (current) use of anticoagulants: Secondary | ICD-10-CM | POA: Diagnosis not present

## 2021-04-05 DIAGNOSIS — I130 Hypertensive heart and chronic kidney disease with heart failure and stage 1 through stage 4 chronic kidney disease, or unspecified chronic kidney disease: Secondary | ICD-10-CM | POA: Diagnosis not present

## 2021-04-05 DIAGNOSIS — I4891 Unspecified atrial fibrillation: Secondary | ICD-10-CM | POA: Diagnosis not present

## 2021-04-05 DIAGNOSIS — Z7984 Long term (current) use of oral hypoglycemic drugs: Secondary | ICD-10-CM | POA: Insufficient documentation

## 2021-04-05 DIAGNOSIS — E1159 Type 2 diabetes mellitus with other circulatory complications: Secondary | ICD-10-CM

## 2021-04-05 DIAGNOSIS — R29898 Other symptoms and signs involving the musculoskeletal system: Secondary | ICD-10-CM

## 2021-04-05 DIAGNOSIS — I5032 Chronic diastolic (congestive) heart failure: Secondary | ICD-10-CM | POA: Diagnosis not present

## 2021-04-05 DIAGNOSIS — I152 Hypertension secondary to endocrine disorders: Secondary | ICD-10-CM

## 2021-04-05 DIAGNOSIS — N1831 Chronic kidney disease, stage 3a: Secondary | ICD-10-CM

## 2021-04-05 DIAGNOSIS — E1122 Type 2 diabetes mellitus with diabetic chronic kidney disease: Secondary | ICD-10-CM | POA: Insufficient documentation

## 2021-04-05 DIAGNOSIS — Z794 Long term (current) use of insulin: Secondary | ICD-10-CM | POA: Diagnosis not present

## 2021-04-05 DIAGNOSIS — R531 Weakness: Secondary | ICD-10-CM | POA: Insufficient documentation

## 2021-04-05 DIAGNOSIS — G63 Polyneuropathy in diseases classified elsewhere: Secondary | ICD-10-CM

## 2021-04-05 MED ORDER — ACCU-CHEK GUIDE ME W/DEVICE KIT: PACK | 0 refills | Status: AC

## 2021-04-05 MED ORDER — ACCU-CHEK GUIDE VI STRP: ORAL_STRIP | 12 refills | Status: AC

## 2021-04-05 MED ORDER — PREGABALIN 50 MG PO CAPS: ORAL_CAPSULE | ORAL | 1 refills | Status: AC

## 2021-04-05 MED ORDER — ACCU-CHEK GUIDE CONTROL VI LIQD: 5 refills | Status: AC

## 2021-04-05 NOTE — Progress Notes (Signed)
Patient ID: Pamela [name], female   DOB: Apr 07, 1956, 65 y.o.   MRN: [id number]  Virtual Visit via Video Note  I connected with Pamela [name] on 04/07/21 at 11:20 AM EDT by a video enabled telemedicine application and verified that I am speaking with the correct person using two identifiers.  Location of all participants today Patient: at home Provider: at office   I discussed the limitations of evaluation and management by telemedicine and the availability of in person appointments. The patient expressed understanding and agreed to proceed.  History of Present Illness: Here to f/u post hospn initially 7/11 - 15 with lower GI bleed due to diverticulsis and stercoral ulcer, then again from SNF on sept w after insurance coverage ran out, had contd knee and hip and generalized debility and out of eliquis x 3 days, and had evidence for new onset right LE cellulitis.  During this hospn several med changes were done including addition aldactone and K improved, and DM med changes that might require prior authorization. Not clear to pt if labs done to f/u at the [facility].    Pt now home and states does not know CBGs as was not able to get meter and supplies.  Still having significant knee and hip and debility, still not walking, but to get home PT.OT start soon, still not standing.  Has been home since oct 26.  Pt denies chest pain, increased sob or doe, wheezing, orthopnea, PND, increased LE swelling, palpitations, dizziness or syncope.   Pt denies polydipsia, polyuria, or new focal neuro s/s, but is out of lyrica and needs refill.   Pt denies fever, wt loss, night sweats, loss of appetite, or other constitutional symptoms   No other new complaints Past Medical History:  Diagnosis Date   ALLERGIC RHINITIS 12/31/2007   ANXIETY 12/31/2007   Atrial fibrillation (HCC)    DEPRESSION 12/31/2007   DIABETES MELLITUS, TYPE II 12/31/2007   Diastolic dysfunction 10/11/2010   Hyperlipidemia 04/23/2012   HYPERTENSION  12/31/2007   Migraine    PNA (pneumonia)    in her 20s   Right knee DJD    Rosacea 10/11/2010   Past Surgical History:  Procedure Laterality Date   2 D&C     1980s   COLONOSCOPY WITH PROPOFOL N/A 12/14/2020   Procedure: COLONOSCOPY WITH PROPOFOL;  Surgeon: Hilarie Fredrickson, MD;  Location: [facility] ENDOSCOPY;  Service: Endoscopy;  Laterality: N/A;    reports that she has never smoked. She has never used smokeless tobacco. She reports that she does not currently use alcohol. She reports that she does not use drugs. family history includes Arthritis in an other family member; Cancer in an other family member; Diabetes in an other family member; Heart disease in an other family member; Mental illness in an other family member; Stroke in an other family member. Allergies  Allergen Reactions   Cymbalta [Duloxetine Hcl] Other (See Comments)    "loopiness"   Gabapentin Itching   No current facility-administered medications on file prior to visit.   Current Outpatient Medications on File Prior to Visit  Medication Sig Dispense Refill   acetaminophen (TYLENOL) 325 MG tablet Take 2 tablets (650 mg total) by mouth every 6 (six) hours as needed for mild pain (or Fever >/= 101). (Patient taking differently: Take 650 mg by mouth every 6 (six) hours as needed for mild pain or headache (or Fever >/= 101).) 30 tablet 0   albuterol (VENTOLIN HFA) 108 (90 Base) MCG/ACT inhaler  Inhale 2 puffs into the lungs every 6 (six) hours as needed for wheezing or shortness of breath. 1 each 0   apixaban (ELIQUIS) 5 MG TABS tablet Take 1 tablet (5 mg total) by mouth 2 (two) times daily. 60 tablet 3   atorvastatin (LIPITOR) 40 MG tablet Take 1 tablet (40 mg total) by mouth daily. (Patient taking differently: Take 40 mg by mouth at bedtime.)     cetirizine (ZYRTEC) 10 MG tablet 1 tab by mouth once daily as needed (Patient not taking: Reported on 04/06/2021) 90 tablet 3   diltiazem (CARDIZEM CD) 360 MG 24 hr capsule Take 1 capsule  (360 mg total) by mouth daily.     empagliflozin (JARDIANCE) 25 MG TABS tablet Take 1 tablet (25 mg total) by mouth daily. 30 tablet    Ensure Max Protein (ENSURE MAX PROTEIN) LIQD Take 330 mLs (11 oz total) by mouth at bedtime. (Patient not taking: No sig reported)     famotidine (PEPCID) 20 MG tablet Take 1 tablet (20 mg total) by mouth daily.     ferrous sulfate 325 (65 FE) MG tablet Take 1 tablet (325 mg total) by mouth daily. 30 tablet 0   furosemide (LASIX) 40 MG tablet Take 1 tablet (40 mg total) by mouth 2 (two) times daily. (Patient taking differently: Take 20 mg by mouth daily.) 30 tablet 0   hydrocerin (EUCERIN) CREA Apply 1 application topically 2 (two) times daily. (Patient taking differently: Apply 1 application topically 2 (two) times daily as needed (dry skin).)  0   insulin aspart (NOVOLOG) 100 UNIT/ML injection Inject 3 Units into the skin 3 (three) times daily with meals. (Patient not taking: No sig reported) 10 mL 11   insulin aspart (NOVOLOG) 100 UNIT/ML injection Inject 0-9 Units into the skin 3 (three) times daily with meals. Do NOT hold if patient is NPO. Sensitive Scale. (Patient not taking: No sig reported) 10 mL 11   insulin glargine-yfgn (SEMGLEE) 100 UNIT/ML injection Inject 0.25 mLs (25 Units total) into the skin daily. (Patient not taking: No sig reported) 10 mL 11   meclizine (ANTIVERT) 12.5 MG tablet TAKE 1 TABLET(12.5 MG) BY MOUTH THREE TIMES DAILY AS NEEDED FOR DIZZINESS (Patient taking differently: Take 12.5 mg by mouth 3 (three) times daily as needed for dizziness.) 60 tablet 0   metFORMIN (GLUCOPHAGE) 500 MG tablet Take 1 tablet (500 mg total) by mouth 2 (two) times daily with a meal. (Patient taking differently: Take 1,000 mg by mouth 2 (two) times daily with a meal.)     metoprolol succinate (TOPROL-XL) 100 MG 24 hr tablet Take 1 tablet (100 mg total) by mouth daily. Take with or immediately following a meal. 90 tablet 3   Multiple Vitamin (MULTIVITAMIN WITH  MINERALS) TABS tablet Take 1 tablet by mouth daily.     Omega-3 Fatty Acids (FISH OIL) 1000 MG CAPS Take 2 capsules (2,000 mg total) by mouth 2 (two) times daily. (Patient not taking: Reported on 04/06/2021) 60 capsule 0   polyethylene glycol (MIRALAX / GLYCOLAX) 17 g packet Take 17 g by mouth 2 (two) times daily. (Patient not taking: Reported on 04/06/2021) 14 each 0   potassium chloride SA (KLOR-CON) 20 MEQ tablet Take 2 tablets (40 mEq total) by mouth 2 (two) times daily.     senna-docusate (SENOKOT-S) 8.6-50 MG tablet Take 1 tablet by mouth 2 (two) times daily as needed for moderate constipation. (Patient not taking: Reported on 04/06/2021) 30 tablet 0   sitaGLIPtin (JANUVIA)  50 MG tablet Take 1 tablet (50 mg total) by mouth daily. (Patient not taking: No sig reported)     spironolactone (ALDACTONE) 25 MG tablet Take 1 tablet (25 mg total) by mouth daily.     [DISCONTINUED] rivaroxaban (XARELTO) 20 MG TABS tablet Take 1 tablet (20 mg total) by mouth daily with supper. (Patient not taking: Reported on 12/13/2020) 30 tablet 0    Observations/Objective: Alert, NAD, appropriate mood and affect, resps normal, cn 2-12 intact, moves all 4s, no visible rash or swelling Lab Results  Component Value Date   WBC 11.7 (H) 04/06/2021   HGB 12.4 04/06/2021   HCT 40.2 04/06/2021   PLT 269 04/06/2021   GLUCOSE 148 (H) 04/06/2021   CHOL 191 06/21/2020   TRIG 110.0 06/21/2020   HDL 63.90 06/21/2020   LDLDIRECT 120.3 04/22/2012   LDLCALC 105 (H) 06/21/2020   ALT 22 04/06/2021   AST 19 04/06/2021   NA 141 04/06/2021   K 3.5 04/06/2021   CL 104 04/06/2021   CREATININE 1.22 (H) 04/06/2021   BUN 13 04/06/2021   CO2 24 04/06/2021   TSH 1.828 03/03/2021   INR 1.2 12/18/2020   HGBA1C 7.9 (H) 03/03/2021   MICROALBUR 4.5 (H) 06/21/2020   Assessment and Plan: See notes  Follow Up Instructions: See notes   I discussed the assessment and treatment plan with the patient. The patient was provided an  opportunity to ask questions and all were answered. The patient agreed with the plan and demonstrated an understanding of the instructions.   The patient was advised to call back or seek an in-person evaluation if the symptoms worsen or if the condition fails to improve as anticipated.  Oliver Barre, MD

## 2021-04-05 NOTE — Telephone Encounter (Signed)
Home Health verbal orders-caller/Agency: [facility]  Callback number: [phone]  Requesting OT/PT/Skilled nursing/Social Work/Speech: Pt  Frequency: 1w1 2w3 1w1  Medication interaction klorcone/meclizine

## 2021-04-05 NOTE — Telephone Encounter (Signed)
Ok for verbals 

## 2021-04-05 NOTE — ED Triage Notes (Signed)
The pt arrived by [facility], home  she reports that she is sob tonight    she was just discharged from [facility] this past Wednesday  she reports that she cannot stand or walk  she is also c/o swelling anf pain in both her legs.  No blood thinner for 2 days some redness in lesions both lower legs scattered over both legs with sl swelliong  iv per ems

## 2021-04-06 ENCOUNTER — Emergency Department (HOSPITAL_COMMUNITY): Payer: Managed Care, Other (non HMO)

## 2021-04-06 DIAGNOSIS — R0602 Shortness of breath: Secondary | ICD-10-CM | POA: Diagnosis not present

## 2021-04-06 LAB — CBC WITH DIFFERENTIAL/PLATELET
Abs Immature Granulocytes: 0.06 10*3/uL (ref 0.00–0.07)
Basophils Absolute: 0 10*3/uL (ref 0.0–0.1)
Basophils Relative: 0 %
Eosinophils Absolute: 0.3 10*3/uL (ref 0.0–0.5)
Eosinophils Relative: 3 %
HCT: 40.2 % (ref 36.0–46.0)
Hemoglobin: 12.4 g/dL (ref 12.0–15.0)
Immature Granulocytes: 1 %
Lymphocytes Relative: 14 %
Lymphs Abs: 1.6 10*3/uL (ref 0.7–4.0)
MCH: 25.2 pg — ABNORMAL LOW (ref 26.0–34.0)
MCHC: 30.8 g/dL (ref 30.0–36.0)
MCV: 81.5 fL (ref 80.0–100.0)
Monocytes Absolute: 0.9 10*3/uL (ref 0.1–1.0)
Monocytes Relative: 7 %
Neutro Abs: 8.8 10*3/uL — ABNORMAL HIGH (ref 1.7–7.7)
Neutrophils Relative %: 75 %
Platelets: 269 10*3/uL (ref 150–400)
RBC: 4.93 MIL/uL (ref 3.87–5.11)
RDW: 17.3 % — ABNORMAL HIGH (ref 11.5–15.5)
WBC: 11.7 10*3/uL — ABNORMAL HIGH (ref 4.0–10.5)
nRBC: 0 % (ref 0.0–0.2)

## 2021-04-06 LAB — COMPREHENSIVE METABOLIC PANEL
ALT: 22 U/L (ref 0–44)
AST: 19 U/L (ref 15–41)
Albumin: 3.5 g/dL (ref 3.5–5.0)
Alkaline Phosphatase: 53 U/L (ref 38–126)
Anion gap: 13 (ref 5–15)
BUN: 13 mg/dL (ref 8–23)
CO2: 24 mmol/L (ref 22–32)
Calcium: 9.9 mg/dL (ref 8.9–10.3)
Chloride: 104 mmol/L (ref 98–111)
Creatinine, Ser: 1.22 mg/dL — ABNORMAL HIGH (ref 0.44–1.00)
GFR, Estimated: 49 mL/min — ABNORMAL LOW (ref >60)
Glucose, Bld: 148 mg/dL — ABNORMAL HIGH (ref 70–99)
Potassium: 3.5 mmol/L (ref 3.5–5.1)
Sodium: 141 mmol/L (ref 135–145)
Total Bilirubin: 1 mg/dL (ref 0.3–1.2)
Total Protein: 7 g/dL (ref 6.5–8.1)

## 2021-04-06 LAB — BRAIN NATRIURETIC PEPTIDE: B Natriuretic Peptide: 236.1 pg/mL — ABNORMAL HIGH (ref 0.0–100.0)

## 2021-04-06 LAB — TROPONIN I (HIGH SENSITIVITY): Troponin I (High Sensitivity): 12 ng/L (ref <18)

## 2021-04-06 LAB — CBG MONITORING, ED
Glucose-Capillary: 163 mg/dL — ABNORMAL HIGH (ref 70–99)
Glucose-Capillary: 179 mg/dL — ABNORMAL HIGH (ref 70–99)
Glucose-Capillary: 117 mg/dL — ABNORMAL HIGH (ref 70–99)

## 2021-04-06 LAB — LACTIC ACID, PLASMA: Lactic Acid, Venous: 1.5 mmol/L (ref 0.5–1.9)

## 2021-04-06 MED ORDER — POTASSIUM CHLORIDE CRYS ER 20 MEQ PO TBCR
40.0000 meq | EXTENDED_RELEASE_TABLET | Freq: Two times a day (BID) | ORAL | Status: DC
  Administered 2021-04-06 – 2021-04-08 (×5): 40 meq via ORAL
  Filled 2021-04-06 (×6): qty 2

## 2021-04-06 MED ORDER — METOPROLOL SUCCINATE ER 25 MG PO TB24
100.0000 mg | ORAL_TABLET | Freq: Every day | ORAL | Status: DC
  Administered 2021-04-06 – 2021-04-07 (×2): 100 mg via ORAL
  Filled 2021-04-06: qty 4
  Filled 2021-04-06: qty 1

## 2021-04-06 MED ORDER — ALBUTEROL SULFATE (2.5 MG/3ML) 0.083% IN NEBU: 2.5000 mg | INHALATION_SOLUTION | Freq: Four times a day (QID) | RESPIRATORY_TRACT | Status: DC | PRN

## 2021-04-06 MED ORDER — FERROUS SULFATE 325 (65 FE) MG PO TABS
325.0000 mg | ORAL_TABLET | Freq: Every day | ORAL | Status: DC
  Administered 2021-04-06 – 2021-04-08 (×3): 325 mg via ORAL
  Filled 2021-04-06 (×3): qty 1

## 2021-04-06 MED ORDER — ACETAMINOPHEN 325 MG PO TABS
650.0000 mg | ORAL_TABLET | Freq: Four times a day (QID) | ORAL | Status: DC | PRN
  Filled 2021-04-06: qty 2

## 2021-04-06 MED ORDER — PREGABALIN 25 MG PO CAPS
50.0000 mg | ORAL_CAPSULE | Freq: Three times a day (TID) | ORAL | Status: DC
  Administered 2021-04-06 – 2021-04-08 (×7): 50 mg via ORAL
  Filled 2021-04-06 (×7): qty 2

## 2021-04-06 MED ORDER — DILTIAZEM HCL ER COATED BEADS 360 MG PO CP24
360.0000 mg | ORAL_CAPSULE | Freq: Every day | ORAL | Status: DC
  Administered 2021-04-06 – 2021-04-08 (×3): 360 mg via ORAL
  Filled 2021-04-06 (×3): qty 1

## 2021-04-06 MED ORDER — GLIPIZIDE 5 MG PO TABS
5.0000 mg | ORAL_TABLET | Freq: Every day | ORAL | Status: DC
  Administered 2021-04-06 – 2021-04-08 (×3): 5 mg via ORAL
  Filled 2021-04-06 (×4): qty 1

## 2021-04-06 MED ORDER — APIXABAN 5 MG PO TABS
5.0000 mg | ORAL_TABLET | Freq: Two times a day (BID) | ORAL | Status: DC
  Administered 2021-04-06 – 2021-04-08 (×5): 5 mg via ORAL
  Filled 2021-04-06 (×5): qty 1

## 2021-04-06 MED ORDER — IOHEXOL 350 MG/ML SOLN
75.0000 mL | Freq: Once | INTRAVENOUS | Status: AC | PRN
  Administered 2021-04-06: 75 mL via INTRAVENOUS

## 2021-04-06 MED ORDER — METFORMIN HCL 500 MG PO TABS: 1000.0000 mg | ORAL_TABLET | Freq: Two times a day (BID) | ORAL | Status: DC

## 2021-04-06 MED ORDER — ATORVASTATIN CALCIUM 40 MG PO TABS
40.0000 mg | ORAL_TABLET | Freq: Every day | ORAL | Status: DC
  Administered 2021-04-06 – 2021-04-07 (×2): 40 mg via ORAL
  Filled 2021-04-06 (×2): qty 1

## 2021-04-06 MED ORDER — INSULIN ASPART 100 UNIT/ML IJ SOLN
0.0000 [IU] | Freq: Three times a day (TID) | INTRAMUSCULAR | Status: DC
  Administered 2021-04-06 (×2): 4 [IU] via SUBCUTANEOUS
  Administered 2021-04-07: 3 [IU] via SUBCUTANEOUS
  Administered 2021-04-07 – 2021-04-08 (×4): 4 [IU] via SUBCUTANEOUS

## 2021-04-06 MED ORDER — METFORMIN HCL 500 MG PO TABS
1000.0000 mg | ORAL_TABLET | Freq: Two times a day (BID) | ORAL | Status: DC
  Administered 2021-04-08: 1000 mg via ORAL
  Filled 2021-04-06: qty 2

## 2021-04-06 MED ORDER — FAMOTIDINE 20 MG PO TABS
20.0000 mg | ORAL_TABLET | Freq: Every day | ORAL | Status: DC
  Administered 2021-04-06 – 2021-04-08 (×3): 20 mg via ORAL
  Filled 2021-04-06 (×3): qty 1

## 2021-04-06 MED ORDER — DICLOFENAC SODIUM 1 % EX GEL
2.0000 g | Freq: Three times a day (TID) | CUTANEOUS | Status: DC
  Administered 2021-04-06 (×2): 2 g via TOPICAL
  Filled 2021-04-06 (×2): qty 100

## 2021-04-06 MED ORDER — FUROSEMIDE 20 MG PO TABS
20.0000 mg | ORAL_TABLET | Freq: Every day | ORAL | Status: DC
  Administered 2021-04-06 – 2021-04-08 (×3): 20 mg via ORAL
  Filled 2021-04-06 (×3): qty 1

## 2021-04-06 MED ORDER — ALBUTEROL SULFATE HFA 108 (90 BASE) MCG/ACT IN AERS: 2.0000 | INHALATION_SPRAY | Freq: Four times a day (QID) | RESPIRATORY_TRACT | Status: DC | PRN

## 2021-04-06 MED ORDER — EMPAGLIFLOZIN 25 MG PO TABS
25.0000 mg | ORAL_TABLET | Freq: Every day | ORAL | Status: DC
  Administered 2021-04-06 – 2021-04-08 (×3): 25 mg via ORAL
  Filled 2021-04-06 (×3): qty 1

## 2021-04-06 MED ORDER — ADULT MULTIVITAMIN W/MINERALS CH
1.0000 | ORAL_TABLET | Freq: Every day | ORAL | Status: DC
  Administered 2021-04-06 – 2021-04-08 (×3): 1 via ORAL
  Filled 2021-04-06 (×3): qty 1

## 2021-04-06 MED ORDER — HYDROCERIN EX CREA: 1.0000 "application " | TOPICAL_CREAM | Freq: Two times a day (BID) | CUTANEOUS | Status: DC | PRN

## 2021-04-06 MED ORDER — FUROSEMIDE 10 MG/ML IJ SOLN
40.0000 mg | Freq: Once | INTRAMUSCULAR | Status: AC
  Administered 2021-04-06: 40 mg via INTRAVENOUS
  Filled 2021-04-06: qty 4

## 2021-04-06 MED ORDER — MELATONIN 3 MG PO TABS: 3.0000 mg | ORAL_TABLET | Freq: Every evening | ORAL | Status: DC | PRN

## 2021-04-06 MED ORDER — MECLIZINE HCL 25 MG PO TABS: 12.5000 mg | ORAL_TABLET | Freq: Three times a day (TID) | ORAL | Status: DC | PRN

## 2021-04-06 MED ORDER — LORATADINE 10 MG PO TABS
10.0000 mg | ORAL_TABLET | Freq: Every day | ORAL | Status: DC
  Administered 2021-04-06 – 2021-04-08 (×3): 10 mg via ORAL
  Filled 2021-04-06 (×3): qty 1

## 2021-04-06 MED ORDER — SPIRONOLACTONE 25 MG PO TABS
25.0000 mg | ORAL_TABLET | Freq: Every day | ORAL | Status: DC
  Administered 2021-04-06 – 2021-04-08 (×3): 25 mg via ORAL
  Filled 2021-04-06 (×3): qty 1

## 2021-04-06 NOTE — ED Notes (Signed)
Mri staff called to determine if the pt  was alert and oriented and if she had any difficulty with the mri  she does not

## 2021-04-06 NOTE — ED Provider Notes (Signed)
[facility] EMERGENCY DEPARTMENT Provider Note   CSN: [id number] Arrival date & time: 04/05/21  2340     History Chief Complaint  Patient presents with   Shortness of Breath    Pamela [name] is a 65 y.o. female.  Patient presents to the emergency department for evaluation of shortness of breath.  Patient has a complex recent past medical history.  Patient has been in inpatient rehab twice recently.  Initially it was her inability to ambulate secondary to hip problems.  Patient reports that she had complications of COVID, pneumonia and GI bleed during rehab stay.  Patient was recently at [facility] rehab and reports that she improved somewhat before going home 2 days ago.  Tonight she started feeling short of breath.  Patient has noticed increased swelling of both of her legs.  She has not experienced any chest pain associated with the shortness of breath.  Patient denies fever and cough.  Patient also indicates that she is experiencing right leg weakness.  Patient reports that when she left the rehab center 2 days ago her right leg felt slightly heavy.  She is now unable to move the leg.  She has some slight decreased sensation as well.      Past Medical History:  Diagnosis Date   ALLERGIC RHINITIS 12/31/2007   ANXIETY 12/31/2007   Atrial fibrillation ([place])    DEPRESSION 12/31/2007   DIABETES MELLITUS, TYPE II 6/57/8469   Diastolic dysfunction 11/09/9526   Hyperlipidemia 04/23/2012   HYPERTENSION 12/31/2007   Migraine    PNA (pneumonia)    in her 50s   Right knee DJD    Rosacea 10/11/2010    Patient Active Problem List   Diagnosis Date Noted   Pressure injury of skin 02/11/2021   Cellulitis of right lower extremity 02/08/2021   Hyperlipidemia associated with type 2 diabetes mellitus ([place]) 02/08/2021   Rectal bleeding    GI bleed 12/18/2020   Stercoral ulcer of rectum    Acute lower GI bleeding 12/13/2020   Chronic diastolic CHF (congestive heart failure) ([place])  11/24/2020   Non-traumatic rhabdomyolysis 11/24/2020   Controlled type 2 diabetes mellitus with hyperglycemia ([place]) 11/24/2020   ARF (acute renal failure) ([place]) 11/18/2020   Elevated CK 11/18/2020   Acute pain of left knee 11/18/2020   AKI (acute kidney injury) ([place]) 11/18/2020   Chronic diastolic heart failure ([place]) 06/24/2020   Debility 06/21/2020   Vertigo 03/20/2020   SOB (shortness of breath) 03/20/2020   Left shoulder pain 02/27/2019   Acute upper respiratory infection 09/07/2018   Low back pain 12/01/2017   Diabetic foot ulcer ([place]) 03/03/2017   Charcot foot due to diabetes mellitus ([place]) 02/28/2017   Diabetic ulcer of toe of left foot associated with type 2 diabetes mellitus, limited to breakdown of skin ([place]) 02/28/2017   Obesity, Class III, BMI 40-49.9 (morbid obesity) ([place]) 02/28/2017   Chronic atrial fibrillation (HCC) 12/08/2015   Navicular fracture of ankle with malunion 05/30/2015   Neuropathic pain of foot 05/30/2015   Navicular fracture, foot 05/02/2015   Right ankle pain 04/19/2015   Cough 11/17/2014   Fatigue 09/25/2014   Ganglion cyst 02/06/2014   Bilateral hand pain 11/29/2012   Normocytic anemia 04/23/2012   CKD (chronic kidney disease) stage 3, GFR 30-59 ml/min (HCC) 04/23/2012   Hyperlipidemia 04/23/2012   Peripheral neuropathy 01/16/2012   DJD (degenerative joint disease) of knee 04/15/2011   Migraine 04/15/2011   Acute on chronic diastolic heart failure ([place]) 10/28/2010  Rosacea 10/11/2010   Anticoagulated 10/11/2010   Encounter for preventative adult health care exam with abnormal findings 10/06/2010   Type 2 diabetes mellitus ([place]) 12/31/2007   Anxiety state 12/31/2007   Depression 12/31/2007   Hypertension associated with diabetes ([place]) 12/31/2007   ALLERGIC RHINITIS 12/31/2007    Past Surgical History:  Procedure Laterality Date   2 D&C     1980s   COLONOSCOPY WITH PROPOFOL N/A 12/14/2020   Procedure: COLONOSCOPY WITH PROPOFOL;  Surgeon:  Irene Shipper, MD;  Location: [facility] ENDOSCOPY;  Service: Endoscopy;  Laterality: N/A;     OB History   No obstetric history on file.     Family History  Problem Relation Age of Onset   Arthritis Other    Cancer Other        breast   Heart disease Other    Stroke Other    Mental illness Other        emotional   Diabetes Other     Social History   Tobacco Use   Smoking status: Never   Smokeless tobacco: Never  Substance Use Topics   Alcohol use: Not Currently    Comment: social   Drug use: No    Home Medications Prior to Admission medications   Medication Sig Start Date End Date Taking? Authorizing Provider  acetaminophen (TYLENOL) 325 MG tablet Take 2 tablets (650 mg total) by mouth every 6 (six) hours as needed for mild pain (or Fever >/= 101). Patient taking differently: Take 650 mg by mouth every 6 (six) hours as needed for mild pain or headache (or Fever >/= 101). 11/24/20  Yes Allie Bossier, MD  albuterol (VENTOLIN HFA) 108 (90 Base) MCG/ACT inhaler Inhale 2 puffs into the lungs every 6 (six) hours as needed for wheezing or shortness of breath. 06/21/20  Yes Biagio Borg, MD  apixaban (ELIQUIS) 5 MG TABS tablet Take 1 tablet (5 mg total) by mouth 2 (two) times daily. 02/18/21  Yes Regalado, Belkys A, MD  atorvastatin (LIPITOR) 40 MG tablet Take 1 tablet (40 mg total) by mouth daily. Patient taking differently: Take 40 mg by mouth at bedtime. 03/07/21  Yes Samella Parr, NP  diclofenac Sodium (VOLTAREN) 1 % GEL Apply 2 g topically 3 (three) times daily.   Yes [provider]  diltiazem (CARDIZEM CD) 360 MG 24 hr capsule Take 1 capsule (360 mg total) by mouth daily. 03/07/21  Yes Samella Parr, NP  empagliflozin (JARDIANCE) 25 MG TABS tablet Take 1 tablet (25 mg total) by mouth daily. 03/07/21  Yes Samella Parr, NP  famotidine (PEPCID) 20 MG tablet Take 1 tablet (20 mg total) by mouth daily. 03/07/21  Yes Samella Parr, NP  ferrous sulfate 325 (65 FE) MG  tablet Take 1 tablet (325 mg total) by mouth daily. 12/26/20  Yes Drenda Freeze, MD  furosemide (LASIX) 40 MG tablet Take 1 tablet (40 mg total) by mouth 2 (two) times daily. Patient taking differently: Take 20 mg by mouth daily. 02/18/21  Yes Regalado, Belkys A, MD  glipiZIDE (GLUCOTROL) 5 MG tablet Take 5 mg by mouth daily before breakfast.   Yes [provider]  hydrocerin (EUCERIN) CREA Apply 1 application topically 2 (two) times daily. Patient taking differently: Apply 1 application topically 2 (two) times daily as needed (dry skin). 03/18/21  Yes Samella Parr, NP  loratadine (CLARITIN) 10 MG tablet Take 10 mg by mouth daily.   Yes [provider]  meclizine (ANTIVERT) 12.5 MG tablet TAKE 1 TABLET(12.5 MG) BY MOUTH THREE TIMES DAILY AS NEEDED FOR DIZZINESS Patient taking differently: Take 12.5 mg by mouth 3 (three) times daily as needed for dizziness. 09/15/20  Yes Biagio Borg, MD  melatonin 3 MG TABS tablet Take 3 mg by mouth at bedtime as needed (sleep).   Yes [provider]  metFORMIN (GLUCOPHAGE) 500 MG tablet Take 1 tablet (500 mg total) by mouth 2 (two) times daily with a meal. Patient taking differently: Take 1,000 mg by mouth 2 (two) times daily with a meal. 03/07/21  Yes Samella Parr, NP  metoprolol succinate (TOPROL-XL) 100 MG 24 hr tablet Take 1 tablet (100 mg total) by mouth daily. Take with or immediately following a meal. 06/21/20  Yes Biagio Borg, MD  Multiple Vitamin (MULTIVITAMIN WITH MINERALS) TABS tablet Take 1 tablet by mouth daily. 03/18/21  Yes Samella Parr, NP  potassium chloride SA (KLOR-CON) 20 MEQ tablet Take 2 tablets (40 mEq total) by mouth 2 (two) times daily. 03/07/21  Yes Samella Parr, NP  pregabalin (LYRICA) 50 MG capsule TAKE 1 CAPSULE(50 MG) BY MOUTH THREE TIMES DAILY 04/05/21  Yes Biagio Borg, MD  spironolactone (ALDACTONE) 25 MG tablet Take 1 tablet (25 mg total) by mouth daily. 03/18/21  Yes Samella Parr, NP   Blood Glucose Calibration (ACCU-CHEK GUIDE CONTROL) LIQD Use as directed once per day E11.9 04/05/21   Biagio Borg, MD  Blood Glucose Monitoring Suppl (ACCU-CHEK GUIDE ME) w/Device KIT Use as directed twice per day E11.9 04/05/21   Biagio Borg, MD  cetirizine (ZYRTEC) 10 MG tablet 1 tab by mouth once daily as needed Patient not taking: Reported on 04/06/2021 06/21/20   Biagio Borg, MD  Ensure Max Protein (ENSURE MAX PROTEIN) LIQD Take 330 mLs (11 oz total) by mouth at bedtime. Patient not taking: No sig reported 03/18/21   Samella Parr, NP  glucose blood (ACCU-CHEK GUIDE) test strip Use as instructed twice per day E11.9 04/05/21   Biagio Borg, MD  insulin aspart (NOVOLOG) 100 UNIT/ML injection Inject 3 Units into the skin 3 (three) times daily with meals. Patient not taking: No sig reported 03/07/21   Samella Parr, NP  insulin aspart (NOVOLOG) 100 UNIT/ML injection Inject 0-9 Units into the skin 3 (three) times daily with meals. Do NOT hold if patient is NPO. Sensitive Scale. Patient not taking: No sig reported 03/18/21   Samella Parr, NP  insulin glargine-yfgn (SEMGLEE) 100 UNIT/ML injection Inject 0.25 mLs (25 Units total) into the skin daily. Patient not taking: No sig reported 02/19/21   Regalado, Belkys A, MD  Omega-3 Fatty Acids (FISH OIL) 1000 MG CAPS Take 2 capsules (2,000 mg total) by mouth 2 (two) times daily. Patient not taking: Reported on 04/06/2021 08/19/17   Larey Dresser, MD  polyethylene glycol (MIRALAX / GLYCOLAX) 17 g packet Take 17 g by mouth 2 (two) times daily. Patient not taking: Reported on 04/06/2021 12/21/20   Shelly Coss, MD  senna-docusate (SENOKOT-S) 8.6-50 MG tablet Take 1 tablet by mouth 2 (two) times daily as needed for moderate constipation. Patient not taking: Reported on 04/06/2021 11/24/20   Allie Bossier, MD  sitaGLIPtin (JANUVIA) 50 MG tablet Take 1 tablet (50 mg total) by mouth daily. Patient not taking: No sig reported 03/18/21    Samella Parr, NP  rivaroxaban (XARELTO) 20 MG TABS tablet Take 1 tablet (20 mg total) by mouth  daily with supper. Patient not taking: Reported on 12/13/2020 11/24/20 12/13/20  Allie Bossier, MD    Allergies    Cymbalta [duloxetine hcl] and Gabapentin  Review of Systems   Review of Systems  Respiratory:  Positive for shortness of breath.   Cardiovascular:  Positive for leg swelling.  Neurological:  Positive for weakness.  All other systems reviewed and are negative.  Physical Exam Updated Vital Signs BP (!) 122/52   Pulse (!) 110   Temp 98.3 F (36.8 C) (Oral)   Resp (!) 26   Ht 6' (1.829 m)   Wt (!) 144.7 kg   LMP 09/08/2010 (Exact Date)   SpO2 93%   BMI 43.26 kg/m   Physical Exam Vitals and nursing note reviewed.  Constitutional:      General: She is not in acute distress.    Appearance: Normal appearance. She is well-developed.  HENT:     Head: Normocephalic and atraumatic.     Right Ear: Hearing normal.     Left Ear: Hearing normal.     Nose: Nose normal.  Eyes:     Conjunctiva/sclera: Conjunctivae normal.     Pupils: Pupils are equal, round, and reactive to light.  Cardiovascular:     Rate and Rhythm: Regular rhythm.     Heart sounds: S1 normal and S2 normal. No murmur heard.   No friction rub. No gallop.  Pulmonary:     Effort: Pulmonary effort is normal. Tachypnea present. No respiratory distress.     Breath sounds: Decreased breath sounds present.  Chest:     Chest wall: No tenderness.  Abdominal:     General: Bowel sounds are normal.     Palpations: Abdomen is soft.     Tenderness: There is no abdominal tenderness. There is no guarding or rebound. Negative signs include Murphy's sign and McBurney's sign.     Hernia: No hernia is present.  Musculoskeletal:        General: Normal range of motion.     Cervical back: Normal range of motion and neck supple.     Right lower leg: 2+ Pitting Edema present.     Left lower leg: 2+ Pitting Edema present.   Skin:    General: Skin is warm and dry.     Findings: No rash.  Neurological:     Mental Status: She is alert and oriented to person, place, and time.     GCS: GCS eye subscore is 4. GCS verbal subscore is 5. GCS motor subscore is 6.     Cranial Nerves: No cranial nerve deficit.     Sensory: No sensory deficit.     Motor: Weakness (RLE) present.     Coordination: Coordination normal.     Deep Tendon Reflexes:     Reflex Scores:      Patellar reflexes are 0 on the right side and 0 on the left side. Psychiatric:        Speech: Speech normal.        Behavior: Behavior normal.        Thought Content: Thought content normal.    ED Results / Procedures / Treatments   Labs (all labs ordered are listed, but only abnormal results are displayed) Labs Reviewed  CBC WITH DIFFERENTIAL/PLATELET - Abnormal; Notable for the following components:      Result Value   WBC 11.7 (*)    MCH 25.2 (*)    RDW 17.3 (*)    Neutro Abs 8.8 (*)  All other components within normal limits  COMPREHENSIVE METABOLIC PANEL - Abnormal; Notable for the following components:   Glucose, Bld 148 (*)    Creatinine, Ser 1.22 (*)    GFR, Estimated 49 (*)    All other components within normal limits  BRAIN NATRIURETIC PEPTIDE - Abnormal; Notable for the following components:   B Natriuretic Peptide 236.1 (*)    All other components within normal limits  LACTIC ACID, PLASMA  TROPONIN I (HIGH SENSITIVITY)    EKG EKG Interpretation  Date/Time:  Friday April 05 2021 23:41:11 EDT Ventricular Rate:  125 PR Interval:    QRS Duration: 108 QT Interval:  319 QTC Calculation: 459 R Axis:   200 Text Interpretation: Atrial fibrillation Probable anterior infarct, age indeterminate Confirmed by Orpah Greek [phone]) on 04/06/2021 3:10:11 AM  Radiology CT Angio Chest Pulmonary Embolism (PE) W or WO Contrast  Result Date: 04/06/2021 CLINICAL DATA:  Shortness of breath.  Leg swelling EXAM: CT ANGIOGRAPHY  CHEST WITH CONTRAST TECHNIQUE: Multidetector CT imaging of the chest was performed using the standard protocol during bolus administration of intravenous contrast. Multiplanar CT image reconstructions and MIPs were obtained to evaluate the vascular anatomy. CONTRAST:  46m OMNIPAQUE IOHEXOL 350 MG/ML SOLN COMPARISON:  None. FINDINGS: Cardiovascular: Satisfactory opacification of the pulmonary arteries to the segmental level. No evidence of pulmonary embolism. Cardiomegaly. No pericardial effusion. Coronary atherosclerosis Mediastinum/Nodes: Goiter with wasting but not invasion of the trachea which measures 14 x 7 mm at the thoracic inlet. Asymmetric enlargement of the heterogeneous left lobe which projects into the upper mediastinum. Lungs/Pleura: 5 mm nodule in the subpleural left lower lobe. Upper Abdomen: No acute finding. Musculoskeletal: Spondylosis with multi-level bridging osteophyte Review of the MIP images confirms the above findings. IMPRESSION: 1. Negative for pulmonary embolism. 2. Cardiomegaly without pulmonary edema. 3. 5 mm pulmonary nodule in the left lower lobe. No follow-up needed if patient is low-risk. Non-contrast chest CT can be considered in 12 months if patient is high-risk. This recommendation follows the consensus statement: Guidelines for Management of Incidental Pulmonary Nodules Detected on CT Images: From the Fleischner Society 2017; Radiology 2017; 284:228-243. 4. Coronary atherosclerosis. 5. Heterogeneous and enlarged thyroid with wasting of the trachea at the thoracic inlet, air column narrowed to 14 x 7 mm. Recommend thyroid [place](ref: J Am Coll Radiol. 2015 Feb;12(2): 143-50). Electronically Signed   By: JJorje GuildM.D.   On: 04/06/2021 05:38   DG Chest Port 1 View  Result Date: 04/06/2021 CLINICAL DATA:  Shortness of breath EXAM: PORTABLE CHEST 1 VIEW COMPARISON:  None. FINDINGS: Lungs are clear.  No pleural effusion or pneumothorax. The heart is normal in size.  IMPRESSION: No evidence of acute cardiopulmonary disease. Electronically Signed   By: SJulian HyM.D.   On: 04/06/2021 01:16    Procedures Procedures   Medications Ordered in ED Medications  apixaban (ELIQUIS) tablet 5 mg (has no administration in time range)  atorvastatin (LIPITOR) tablet 40 mg (has no administration in time range)  diltiazem (CARDIZEM CD) 24 hr capsule 360 mg (has no administration in time range)  empagliflozin (JARDIANCE) tablet 25 mg (has no administration in time range)  famotidine (PEPCID) tablet 20 mg (has no administration in time range)  glipiZIDE (GLUCOTROL) tablet 5 mg (has no administration in time range)  metoprolol succinate (TOPROL-XL) 24 hr tablet 100 mg (has no administration in time range)  pregabalin (LYRICA) capsule 50 mg (has no administration in time range)  insulin aspart (novoLOG) injection 0-20  Units (has no administration in time range)  albuterol (PROVENTIL) (2.5 MG/3ML) 0.083% nebulizer solution 2.5 mg (has no administration in time range)  furosemide (LASIX) injection 40 mg (has no administration in time range)  metFORMIN (GLUCOPHAGE) tablet 1,000 mg (has no administration in time range)  iohexol (OMNIPAQUE) 350 MG/ML injection 75 mL (75 mLs Intravenous Contrast Given 04/06/21 2355)    ED Course  I have reviewed the triage vital signs and the nursing notes.  Pertinent labs & imaging results that were available during my care of the patient were reviewed by me and considered in my medical decision making (see chart for details).    MDM Rules/Calculators/A&P                           Patient presents primarily for evaluation of shortness of breath.  Patient reports that she was diagnosed with COVID in June and then had a pneumonia afterwards.  She was in rehab for extended period of time, then discharged.  She ended up back in rehab recently because of inability to ambulate.  She did receive some therapy when she was in rehab,  discharged 2 days ago.  Patient reports that she still cannot ambulate but she was able to stand and pivot on her own.  Over the last 2 days, however, her right leg has become very weak.    Patient's pulmonary work-up has been largely negative.  She is in atrial fibrillation chronically.  She occasionally goes above 100 bpm, but is largely rate controlled.  She is already on Eliquis for anticoagulation.  Patient does not appear to have active congestive heart failure.  Chest x-ray was clear.  BNP is 200.  Patient underwent CT angiography that did not show any evidence of PE or other pulmonary abnormality.  Patient does have weakness of the right lower extremity.  Etiology is unclear.  Discussed briefly with on-call neurology.  Recommendation is MRI brain and lumbar spine.  This has been ordered, will sign out to oncoming ER physician to follow-up on results.  If no findings, may need transition of care consult to help with disposition.  Final Clinical Impression(s) / ED Diagnoses Final diagnoses:  Shortness of breath  Right leg weakness    Rx / DC Orders ED Discharge Orders     None        Orpah Greek, MD 04/06/21 [phone]

## 2021-04-06 NOTE — ED Notes (Signed)
Pt back from MRI 

## 2021-04-06 NOTE — ED Notes (Signed)
Pt placed on a purewick 

## 2021-04-06 NOTE — ED Notes (Signed)
Received verbal report from Jordan B RN at this time 

## 2021-04-06 NOTE — ED Provider Notes (Signed)
Patient seen after prior EDP.  Patient without findings on work-up today that suggests need for acute inpatient treatment.  Social work/TOC consult initiated for placement.   Wynetta Fines, MD 04/06/21 1124

## 2021-04-06 NOTE — Evaluation (Signed)
Physical Therapy Evaluation Patient Details Name: Pamela [name] MRN: [id number] DOB: 1955-08-23 Today's Date: 04/06/2021  History of Present Illness  Pt is a 65 y.o. female who presented to the ED with SOB. She has recently been discharged from [facility] inpatient rehab on Wedensday. She feels like she can no longer transfer. She was using a slide board and perfroming stand pivot transfers.  Marland Kitchen PMH includes anxiety, depression, afib, CHF, DM2, HTN, rosacea, migraines, R knee DJD. She also had a prior 60 day stay at a rehab facility  Clinical Impression  Patient was able to sit up at the edge of the bed but was unable to stand with total assist. She also required assist to scoot up towards the edge of the bed. She has a brother but he lives in [place]. She will require a significant amount of help at home which she does not have. She would benefit most from rehab at a SNF at this time. While she is in the hospital PT will continue to work with her.        Recommendations for follow up therapy are one component of a multi-disciplinary discharge planning process, led by the attending physician.  Recommendations may be updated based on patient status, additional functional criteria and insurance authorization.  Follow Up Recommendations Skilled nursing-short term rehab (<3 hours/day)    Assistance Recommended at Discharge Frequent or constant Supervision/Assistance  Functional Status Assessment Patient has had a recent decline in their functional status and demonstrates the ability to make significant improvements in function in a reasonable and predictable amount of time.  Equipment Recommendations  None recommended by PT    Recommendations for Other Services Rehab consult     Precautions / Restrictions Precautions Precautions: Fall Precaution Comments: Bladder/bowel incontinence Restrictions Weight Bearing Restrictions: No      Mobility  Bed Mobility Overal bed mobility: Modified  Independent Bed Mobility: Supine to Sit;Sit to Supine     Supine to sit: Modified independent (Device/Increase time) Sit to supine: Modified independent (Device/Increase time)   General bed mobility comments: able to swing legs in and out of the bed without asist    Transfers Overall transfer level: Needs assistance   Transfers: Sit to/from Stand Sit to Stand: From elevated surface;Max assist;+2 physical assistance;+2 safety/equipment          Lateral/Scoot Transfers: Total assist;+2 physical assistance;+2 safety/equipment General transfer comment: Patient unable to stand despite 2 trials and cuing. She was unable to get her bottom off the bed. She required min a to scoot up the edge of the bed.    Ambulation/Gait             General Gait Details: unable  Stairs            Wheelchair Mobility    Modified Rankin (Stroke Patients Only)       Balance Overall balance assessment: Needs assistance Sitting-balance support: Feet supported Sitting balance-[name]: Good Sitting balance - Comments: Mod I sitting EOB statically and dynamically.                                     Pertinent Vitals/Pain Pain Assessment: No/denies pain    Home Living Family/patient expects to be discharged to:: Private residence Living Arrangements: Alone Available Help at Discharge: Family;Available PRN/intermittently Type of Home: Apartment Home Access: Level entry       Home Layout: One level Home Equipment: Shower  seat      Prior Function Prior Level of Function : Needs assist       Physical Assist : ADLs (physical)   ADLs (physical): IADLs Mobility Comments: Patient reports she was able to get to a wheelchair for a day or two. Her brother assists her.       Hand Dominance   Dominant Hand: Right    Extremity/Trunk Assessment   Upper Extremity Assessment Upper Extremity Assessment: Generalized weakness    Lower Extremity  Assessment Lower Extremity Assessment: Generalized weakness RLE Deficits / Details: bilaterl edema although patient reports it is better    Cervical / Trunk Assessment Cervical / Trunk Assessment: Normal  Communication      Cognition Arousal/Alertness: Awake/alert Behavior During Therapy: WFL for tasks assessed/performed Overall Cognitive Status: Within Functional Limits for tasks assessed                                 General Comments: pleasant and agreeable to participate in session. Very motivated.        General Comments General comments (skin integrity, edema, etc.): Patients gown wet. NT notified    Exercises     Assessment/Plan    PT Assessment Patient needs continued PT services  PT Problem List Decreased strength;Decreased range of motion;Decreased activity tolerance;Decreased balance;Decreased mobility;Decreased coordination;Decreased knowledge of use of DME;Obesity;Pain       PT Treatment Interventions DME instruction;Gait training;Functional mobility training;Therapeutic activities;Therapeutic exercise;Balance training;Neuromuscular re-education;Patient/family education;Wheelchair mobility training    PT Goals (Current goals can be found in the Care Plan section)  Acute Rehab PT Goals Patient Stated Goal: to go to rehab tomorrow PT Goal Formulation: With patient Time For Goal Achievement: 03/25/21 Potential to Achieve Goals: Fair    Frequency Min 3X/week   Barriers to discharge Decreased caregiver support;Other (comment) brother lives in [place]    Co-evaluation               AM-PAC PT "6 Clicks" Mobility  Outcome Measure Help needed turning from your back to your side while in a flat bed without using bedrails?: None Help needed moving from lying on your back to sitting on the side of a flat bed without using bedrails?: None Help needed moving to and from a bed to a chair (including a wheelchair)?: Total Help needed standing up  from a chair using your arms (e.g., wheelchair or bedside chair)?: Total Help needed to walk in hospital room?: Total Help needed climbing 3-5 steps with a railing? : Total 6 Click Score: 12    End of Session Equipment Utilized During Treatment: Gait belt Activity Tolerance: Patient limited by fatigue Patient left: in bed;with call bell/phone within reach Nurse Communication: Mobility status PT Visit Diagnosis: Muscle weakness (generalized) (M62.81);Other abnormalities of gait and mobility (R26.89)    Time: 5852-7782 PT Time Calculation (min) (ACUTE ONLY): 20 min   Charges:   PT Evaluation $PT Eval Moderate Complexity: 1 Mod           Dessie Coma PT DPT  04/06/2021, 2:15 PM

## 2021-04-06 NOTE — ED Notes (Signed)
Full bed changed and gown changed

## 2021-04-06 NOTE — Care Management (Signed)
PT order placed for recommendations. CSW will follow for needs, recommendations, and transitions.

## 2021-04-06 NOTE — TOC Initial Note (Signed)
Transition of Care [facility]) - Initial/Assessment Note    Patient Details  Name: Pamela [name] MRN: [id number] Date of Birth: 1955/07/31  Transition of Care [facility]) CM/SW Contact:    Alfredia Ferguson, LCSW Phone Number: 04/06/2021, 12:21 PM  Clinical Narrative:                 CSW met with patient as ED provider places SNF placement consult. CSW talked with patient regarding her prior experience and notes patient reports she was at [facility] at fourteen days until discharge on Wednesday. Patient reports her feeling of her heart being different, difficulty breathing and being unable to get out of her wheelchair. Patient reported she would be open to going to short term rehab if PT recommends in and stated preference for the [place] area. CSW informed patient of likely limited SNF days remaining but will follow-up on referrals when PT recommendations are made.   Expected Discharge Plan: Skilled Nursing Facility Barriers to Discharge: Continued Medical Work up   Patient Goals and CMS Choice Patient states their goals for this hospitalization and ongoing recovery are:: "I want to go home, but if I need SNF I will go." CMS Medicare.gov Compare Post Acute Care list provided to:: Patient Choice offered to / list presented to : Patient  Expected Discharge Plan and Services Expected Discharge Plan: [place] In-house Referral: Clinical Social Work     Living arrangements for the past 2 months: [place]                                      Prior Living Arrangements/Services Living arrangements for the past 2 months: Single Family Home Lives with:: Self   Do you feel safe going back to the place where you live?: Yes      Need for Family Participation in Patient Care: No (Comment) Care giver support system in place?: Yes (comment)   Criminal Activity/Legal Involvement Pertinent to Current Situation/Hospitalization: No - Comment as needed  Activities of Daily  Living      Permission Sought/Granted Permission sought to share information with : Facility Sport and exercise psychologist, Family Supports Permission granted to share information with : Yes, Release of Information Signed              Emotional Assessment Appearance:: Appears stated age Attitude/Demeanor/Rapport: Engaged Affect (typically observed): Calm Orientation: : Oriented to Self, Oriented to Place, Oriented to  Time, Oriented to Situation Alcohol / Substance Use: Not Applicable Psych Involvement: No (comment)  Admission diagnosis:  SOB, AFIB Patient Active Problem List   Diagnosis Date Noted   Pressure injury of skin 02/11/2021   Cellulitis of right lower extremity 02/08/2021   Hyperlipidemia associated with type 2 diabetes mellitus ([place]) 02/08/2021   Rectal bleeding    GI bleed 12/18/2020   Stercoral ulcer of rectum    Acute lower GI bleeding 12/13/2020   Chronic diastolic CHF (congestive heart failure) ([place]) 11/24/2020   Non-traumatic rhabdomyolysis 11/24/2020   Controlled type 2 diabetes mellitus with hyperglycemia ([place]) 11/24/2020   ARF (acute renal failure) ([place]) 11/18/2020   Elevated CK 11/18/2020   Acute pain of left knee 11/18/2020   AKI (acute kidney injury) ([place]) 11/18/2020   Chronic diastolic heart failure ([place]) 06/24/2020   Debility 06/21/2020   Vertigo 03/20/2020   SOB (shortness of breath) 03/20/2020   Left shoulder pain 02/27/2019   Acute upper respiratory infection 09/07/2018  Low back pain 12/01/2017   Diabetic foot ulcer ([place]) 03/03/2017   Charcot foot due to diabetes mellitus ([place]) 02/28/2017   Diabetic ulcer of toe of left foot associated with type 2 diabetes mellitus, limited to breakdown of skin ([place]) 02/28/2017   Obesity, Class III, BMI 40-49.9 (morbid obesity) ([place]) 02/28/2017   Chronic atrial fibrillation (HCC) 12/08/2015   Navicular fracture of ankle with malunion 05/30/2015   Neuropathic pain of foot 05/30/2015   Navicular fracture, foot  05/02/2015   Right ankle pain 04/19/2015   Cough 11/17/2014   Fatigue 09/25/2014   Ganglion cyst 02/06/2014   Bilateral hand pain 11/29/2012   Normocytic anemia 04/23/2012   CKD (chronic kidney disease) stage 3, GFR 30-59 ml/min (HCC) 04/23/2012   Hyperlipidemia 04/23/2012   Peripheral neuropathy 01/16/2012   DJD (degenerative joint disease) of knee 04/15/2011   Migraine 04/15/2011   Acute on chronic diastolic heart failure ([place]) 10/28/2010   Rosacea 10/11/2010   Anticoagulated 10/11/2010   Encounter for preventative adult health care exam with abnormal findings 10/06/2010   Type 2 diabetes mellitus ([place]) 12/31/2007   Anxiety state 12/31/2007   Depression 12/31/2007   Hypertension associated with diabetes ([place]) 12/31/2007   ALLERGIC RHINITIS 12/31/2007   PCP:  Biagio Borg, MD Pharmacy:   [facility] [place], [address] AT [facility] [address] [place] [address] Phone: [phone] Fax: [phone]     Social Determinants of Health (SDOH) Interventions    Readmission Risk Interventions No flowsheet data found.

## 2021-04-06 NOTE — ED Notes (Signed)
Pt transported to MRI 

## 2021-04-06 NOTE — ED Notes (Signed)
Pt on hospital bed at this time.

## 2021-04-07 ENCOUNTER — Encounter: Payer: Self-pay | Admitting: Internal Medicine

## 2021-04-07 DIAGNOSIS — R0602 Shortness of breath: Secondary | ICD-10-CM | POA: Diagnosis not present

## 2021-04-07 LAB — CBG MONITORING, ED
Glucose-Capillary: 187 mg/dL — ABNORMAL HIGH (ref 70–99)
Glucose-Capillary: 160 mg/dL — ABNORMAL HIGH (ref 70–99)
Glucose-Capillary: 127 mg/dL — ABNORMAL HIGH (ref 70–99)
Glucose-Capillary: 158 mg/dL — ABNORMAL HIGH (ref 70–99)

## 2021-04-07 NOTE — TOC Progression Note (Addendum)
Transition of Care [facility]) - Progression Note    Patient Details  Name: Pamela [name] MRN: [id number] Date of Birth: 12/18/1955  Transition [facility] CM/SW Contact  Windle Guard, [place] Phone Number: 04/07/2021, 10:27 AM  Clinical Narrative:    CSW noted bed offer from [facility] and patient accepts. CSW reached out to admissions at [facility] to confirm and progress. CSW will continue following at this time.   10:29: CSW received return call from [facility] and they will have to follow-up Monday for bed availability and insurance.    Expected Discharge Plan: Skilled Nursing Facility Barriers to Discharge: Continued Medical Work up  Expected Discharge Plan and Services Expected Discharge Plan: Skilled Nursing Facility In-house Referral: Clinical Social Work     Living arrangements for the past 2 months: Single Family Home                                       Social Determinants of Health (SDOH) Interventions    Readmission Risk Interventions No flowsheet data found.

## 2021-04-07 NOTE — Assessment & Plan Note (Signed)
To cont home pt/ot; may need ortho referral as well

## 2021-04-07 NOTE — Assessment & Plan Note (Signed)
Lab Results  Component Value Date   HGBA1C 7.9 (H) 03/03/2021   Mild uncontrolled recently,, pt to continue current medical treatment jardiacne, glucotrol, novolog, and sent glucometer and supples rx

## 2021-04-07 NOTE — Patient Instructions (Signed)
Ok to restart the lyrica as before  Please take all new as prescribed - the glucometer and supplies  Please continue all other medications as before, and refills have been done if requested.  Please have the pharmacy call with any other refills you may need.  Please continue your efforts at being more active, low cholesterol diet, and weight control  Please keep your appointments with your specialists as you may have planned

## 2021-04-07 NOTE — ED Notes (Signed)
Pt bed linen was soiled in urine. Bed has been changed and new linen applied to bed. Pt hooked to purewick and is hooked to suction canister. Call light is within reach.

## 2021-04-07 NOTE — Assessment & Plan Note (Signed)
Lab Results  Component Value Date   CREATININE 1.22 (H) 04/06/2021   Stable overall, cont to avoid nephrotoxins

## 2021-04-07 NOTE — Assessment & Plan Note (Signed)
.   BP Readings from Last 3 Encounters:  04/07/21 108/75  03/19/21 118/70  12/26/20 112/65   Stable recently,, pt to continue medical treatment cardizem, toprol, aldactone

## 2021-04-07 NOTE — Assessment & Plan Note (Signed)
With uncontrolled pain, for lyrica restart,  to f/u any worsening symptoms or concerns

## 2021-04-08 DIAGNOSIS — R0602 Shortness of breath: Secondary | ICD-10-CM | POA: Diagnosis not present

## 2021-04-08 LAB — CBG MONITORING, ED
Glucose-Capillary: 153 mg/dL — ABNORMAL HIGH (ref 70–99)
Glucose-Capillary: 162 mg/dL — ABNORMAL HIGH (ref 70–99)

## 2021-04-08 LAB — RESP PANEL BY RT-PCR (FLU A&B, COVID) ARPGX2
Influenza A by PCR: NEGATIVE
Influenza B by PCR: NEGATIVE
SARS Coronavirus 2 by RT PCR: NEGATIVE

## 2021-04-08 NOTE — Progress Notes (Signed)
CSW received call from [place] at [facility]. Per Wille Celeste if Pt does not leave ED by 8pm [facility] will not be able to accept Pt until tomorrow morning.

## 2021-04-08 NOTE — Telephone Encounter (Signed)
Verbals given  

## 2021-04-08 NOTE — ED Notes (Signed)
Attempted to call report to nurse at [facility] [phone], but nurse was unavailable at this time. Left number with receptionist for call back.

## 2021-04-08 NOTE — Progress Notes (Signed)
AVS and admission paperwork sent to admissions

## 2021-04-08 NOTE — ED Provider Notes (Signed)
Patient accepted to [facility].   No acute changes today.    Pollyann Savoy, MD 04/08/21 [phone]

## 2021-04-08 NOTE — ED Notes (Signed)
Pt provided with complete bed change and pericare. Dinner tray provided for pt. Pt updated on plan of care to be admitted to SNF.

## 2021-04-08 NOTE — ED Notes (Signed)
Called ptar pt now has 3 people infront of them

## 2021-04-08 NOTE — Progress Notes (Signed)
Patient accepted at [facility] and will be going to room 3222, pending covid test.

## 2021-05-08 ENCOUNTER — Encounter (HOSPITAL_COMMUNITY): Payer: Self-pay | Admitting: Radiology

## 2021-07-09 ENCOUNTER — Encounter: Payer: Self-pay | Admitting: Internal Medicine

## 2021-07-09 ENCOUNTER — Other Ambulatory Visit: Payer: Self-pay

## 2021-07-09 ENCOUNTER — Telehealth (INDEPENDENT_AMBULATORY_CARE_PROVIDER_SITE_OTHER): Payer: Medicare Other | Admitting: Internal Medicine

## 2021-07-09 DIAGNOSIS — I152 Hypertension secondary to endocrine disorders: Secondary | ICD-10-CM

## 2021-07-09 DIAGNOSIS — E11610 Type 2 diabetes mellitus with diabetic neuropathic arthropathy: Secondary | ICD-10-CM | POA: Diagnosis not present

## 2021-07-09 DIAGNOSIS — E1159 Type 2 diabetes mellitus with other circulatory complications: Secondary | ICD-10-CM

## 2021-07-09 NOTE — Progress Notes (Signed)
Patient ID: Pamela [name], female   DOB: 1956/06/01, 66 y.o.   MRN: [id number]  Virtual Visit via Video Note  I connected with Pamela [name] on 07/09/21 at  1:00 PM EST by a video enabled telemedicine application and verified that I am speaking with the correct person using two identifiers.  Location of all participants today Patient: at [facility] facility Provider: at office   I discussed the limitations of evaluation and management by telemedicine and the availability of in person appointments. The patient expressed understanding and agreed to proceed.  History of Present Illness: Here to f/u, overall doing fair, Pt denies chest pain, increased sob or doe, wheezing, orthopnea, PND, increased LE swelling, palpitations, dizziness or syncope.   Pt denies polydipsia, polyuria, or new focal neuro s/s.  Remains on o2 2L.  Getting PT/OT at facility, followed also by NP there.  No recent lab testing in f/u for low K at last hospn.  CBGs per pt in low 100s   Past Medical History:  Diagnosis Date   ALLERGIC RHINITIS 12/31/2007   ANXIETY 12/31/2007   Atrial fibrillation ([place])    DEPRESSION 12/31/2007   DIABETES MELLITUS, TYPE II 10/12/3974   Diastolic dysfunction 12/10/4191   Hyperlipidemia 04/23/2012   HYPERTENSION 12/31/2007   Migraine    PNA (pneumonia)    in her 55s   Right knee DJD    Rosacea 10/11/2010   Past Surgical History:  Procedure Laterality Date   2 D&C     1980s   COLONOSCOPY WITH PROPOFOL N/A 12/14/2020   Procedure: COLONOSCOPY WITH PROPOFOL;  Surgeon: Irene Shipper, MD;  Location: [facility] ENDOSCOPY;  Service: Endoscopy;  Laterality: N/A;    reports that she has never smoked. She has never used smokeless tobacco. She reports that she does not currently use alcohol. She reports that she does not use drugs. family history includes Arthritis in an other family member; Cancer in an other family member; Diabetes in an other family member; Heart disease in an other family member; Mental  illness in an other family member; Stroke in an other family member. Allergies  Allergen Reactions   Cymbalta [Duloxetine Hcl] Other (See Comments)    "loopiness"   Gabapentin Itching   Current Outpatient Medications on File Prior to Visit  Medication Sig Dispense Refill   acetaminophen (TYLENOL) 325 MG tablet Take 2 tablets (650 mg total) by mouth every 6 (six) hours as needed for mild pain (or Fever >/= 101). (Patient taking differently: Take 650 mg by mouth every 6 (six) hours as needed for mild pain or headache (or Fever >/= 101).) 30 tablet 0   albuterol (VENTOLIN HFA) 108 (90 Base) MCG/ACT inhaler Inhale 2 puffs into the lungs every 6 (six) hours as needed for wheezing or shortness of breath. 1 each 0   apixaban (ELIQUIS) 5 MG TABS tablet Take 1 tablet (5 mg total) by mouth 2 (two) times daily. 60 tablet 3   atorvastatin (LIPITOR) 40 MG tablet Take 1 tablet (40 mg total) by mouth daily. (Patient taking differently: Take 40 mg by mouth at bedtime.)     Blood Glucose Calibration (ACCU-CHEK GUIDE CONTROL) LIQD Use as directed once per day E11.9 1 each 5   Blood Glucose Monitoring Suppl (ACCU-CHEK GUIDE ME) w/Device KIT Use as directed twice per day E11.9 1 kit 0   cetirizine (ZYRTEC) 10 MG tablet 1 tab by mouth once daily as needed (Patient not taking: Reported on 04/06/2021) 90 tablet 3   diclofenac  Sodium (VOLTAREN) 1 % GEL Apply 2 g topically 3 (three) times daily.     diltiazem (CARDIZEM CD) 360 MG 24 hr capsule Take 1 capsule (360 mg total) by mouth daily.     empagliflozin (JARDIANCE) 25 MG TABS tablet Take 1 tablet (25 mg total) by mouth daily. 30 tablet    Ensure Max Protein (ENSURE MAX PROTEIN) LIQD Take 330 mLs (11 oz total) by mouth at bedtime. (Patient not taking: No sig reported)     famotidine (PEPCID) 20 MG tablet Take 1 tablet (20 mg total) by mouth daily.     ferrous sulfate 325 (65 FE) MG tablet Take 1 tablet (325 mg total) by mouth daily. 30 tablet 0   furosemide (LASIX)  40 MG tablet Take 1 tablet (40 mg total) by mouth 2 (two) times daily. (Patient taking differently: Take 20 mg by mouth daily.) 30 tablet 0   glipiZIDE (GLUCOTROL) 5 MG tablet Take 5 mg by mouth daily before breakfast.     glucose blood (ACCU-CHEK GUIDE) test strip Use as instructed twice per day E11.9 200 each 12   hydrocerin (EUCERIN) CREA Apply 1 application topically 2 (two) times daily. (Patient taking differently: Apply 1 application topically 2 (two) times daily as needed (dry skin).)  0   insulin aspart (NOVOLOG) 100 UNIT/ML injection Inject 3 Units into the skin 3 (three) times daily with meals. (Patient not taking: No sig reported) 10 mL 11   insulin aspart (NOVOLOG) 100 UNIT/ML injection Inject 0-9 Units into the skin 3 (three) times daily with meals. Do NOT hold if patient is NPO. Sensitive Scale. (Patient not taking: No sig reported) 10 mL 11   insulin glargine-yfgn (SEMGLEE) 100 UNIT/ML injection Inject 0.25 mLs (25 Units total) into the skin daily. (Patient not taking: No sig reported) 10 mL 11   loratadine (CLARITIN) 10 MG tablet Take 10 mg by mouth daily.     meclizine (ANTIVERT) 12.5 MG tablet TAKE 1 TABLET(12.5 MG) BY MOUTH THREE TIMES DAILY AS NEEDED FOR DIZZINESS (Patient taking differently: Take 12.5 mg by mouth 3 (three) times daily as needed for dizziness.) 60 tablet 0   melatonin 3 MG TABS tablet Take 3 mg by mouth at bedtime as needed (sleep).     metFORMIN (GLUCOPHAGE) 500 MG tablet Take 1 tablet (500 mg total) by mouth 2 (two) times daily with a meal. (Patient taking differently: Take 1,000 mg by mouth 2 (two) times daily with a meal.)     metoprolol succinate (TOPROL-XL) 100 MG 24 hr tablet Take 1 tablet (100 mg total) by mouth daily. Take with or immediately following a meal. 90 tablet 3   Multiple Vitamin (MULTIVITAMIN WITH MINERALS) TABS tablet Take 1 tablet by mouth daily.     Omega-3 Fatty Acids (FISH OIL) 1000 MG CAPS Take 2 capsules (2,000 mg total) by mouth 2 (two)  times daily. (Patient not taking: Reported on 04/06/2021) 60 capsule 0   polyethylene glycol (MIRALAX / GLYCOLAX) 17 g packet Take 17 g by mouth 2 (two) times daily. (Patient not taking: Reported on 04/06/2021) 14 each 0   potassium chloride SA (KLOR-CON) 20 MEQ tablet Take 2 tablets (40 mEq total) by mouth 2 (two) times daily.     pregabalin (LYRICA) 50 MG capsule TAKE 1 CAPSULE(50 MG) BY MOUTH THREE TIMES DAILY 270 capsule 1   senna-docusate (SENOKOT-S) 8.6-50 MG tablet Take 1 tablet by mouth 2 (two) times daily as needed for moderate constipation. (Patient not taking: Reported on 04/06/2021)  30 tablet 0   sitaGLIPtin (JANUVIA) 50 MG tablet Take 1 tablet (50 mg total) by mouth daily. (Patient not taking: No sig reported)     spironolactone (ALDACTONE) 25 MG tablet Take 1 tablet (25 mg total) by mouth daily.     [DISCONTINUED] rivaroxaban (XARELTO) 20 MG TABS tablet Take 1 tablet (20 mg total) by mouth daily with supper. (Patient not taking: Reported on 12/13/2020) 30 tablet 0   No current facility-administered medications on file prior to visit.   Observations/Objective: Alert, NAD, appropriate mood and affect, resps normal, cn 2-12 intact, moves all 4s, no visible rash or swelling Lab Results  Component Value Date   WBC 11.7 (H) 04/06/2021   HGB 12.4 04/06/2021   HCT 40.2 04/06/2021   PLT 269 04/06/2021   GLUCOSE 148 (H) 04/06/2021   CHOL 191 06/21/2020   TRIG 110.0 06/21/2020   HDL 63.90 06/21/2020   LDLDIRECT 120.3 04/22/2012   LDLCALC 105 (H) 06/21/2020   ALT 22 04/06/2021   AST 19 04/06/2021   NA 141 04/06/2021   K 3.5 04/06/2021   CL 104 04/06/2021   CREATININE 1.22 (H) 04/06/2021   BUN 13 04/06/2021   CO2 24 04/06/2021   TSH 1.828 03/03/2021   INR 1.2 12/18/2020   HGBA1C 7.9 (H) 03/03/2021   MICROALBUR 4.5 (H) 06/21/2020   Assessment and Plan: See notes  Follow Up Instructions: See notes   I discussed the assessment and treatment plan with the patient. The patient  was provided an opportunity to ask questions and all were answered. The patient agreed with the plan and demonstrated an understanding of the instructions.   The patient was advised to call back or seek an in-person evaluation if the symptoms worsen or if the condition fails to improve as anticipated.   Cathlean Cower, MD

## 2021-07-09 NOTE — Assessment & Plan Note (Signed)
BP Readings from Last 3 Encounters:  04/08/21 118/62  03/19/21 118/70  12/26/20 112/65   Stable, pt to continue medical treatment toprol, aldactone and advised pt to ask for f/u K on aldactone

## 2021-07-09 NOTE — Patient Instructions (Signed)
Please continue all other medications as before, and refills have been done if requested.  Please have the pharmacy call with any other refills you may need.  Please continue your efforts at being more active, low cholesterol diet, and weight control.  Please keep your appointments with your specialists as you may have planned     

## 2021-07-09 NOTE — Assessment & Plan Note (Signed)
Lab Results  Component Value Date   HGBA1C 7.9 (H) 03/03/2021   Stable, pt to continue current medical treatment jardiacne, novolog, glucotrol

## 2021-07-25 ENCOUNTER — Encounter: Payer: Self-pay | Admitting: Internal Medicine

## 2021-08-07 NOTE — Progress Notes (Signed)
? ? ?Subjective:   ? ?CC: R shoulder pain ? ?I, Wendy Poet, LAT, ATC, am serving as scribe for Dr. Lynne Leader. ? ?HPI: Pt is a 66 y/o female c/o R shoulder pain ongoing since Oct. Pt was in PT to improve her mobility when she noticed the R shoulder pain. She locates her pain to the superior aspect of the R shoulder. Pt is a Public house manager and Rehab.  She has been in nursing home for quite a while now after a series of medical issues after hospitalization.  She is bedbound to wheelchair bound and not able to walk currently. ? ?R shoulder mechanical symptoms: no ?Radiating pain: no ?Aggravating factors: overhead motion, IR ?Treatments tried: diclofenac gel, Tylenol  ? ?Pertinent review of Systems: No fevers or chills ? ?Relevant historical information: Diabetes.  Heart failure.  Hypertension.  History of navicular fracture ? ? ?Objective:   ? ?Vitals:  ? 08/08/21 1117  ?BP: 134/86  ?Pulse: 91  ?SpO2: 99%  ? ?General: Well Developed, well nourished, and in no acute distress.  ? ?MSK: Right shoulder ?Normal appearing ?Range of motion abduction 110 degrees.  External rotation full.  Functional internal rotation to posterior iliac crest. ?Strength abduction 4/5.  External and internal rotation strength is 4+/5. ?Positive Hawkins and Neer's test. ?Pulses cap refill and sensation are intact distally. ? ?Lab and Radiology Results ? ?Procedure: Real-time Ultrasound Guided Injection of right shoulder subacromial bursa ?Device: Philips Affiniti 50G ?Images permanently stored and available for review in PACS ?Ultrasound evaluation prior to injection reveals present rotator cuff tendons without full-thickness retraction.  Mild to moderate subacromial bursitis. ?Verbal informed consent obtained.  Discussed risks and benefits of procedure. Warned about infection bleeding damage to structures skin hypopigmentation and fat atrophy among others. ?Patient expresses understanding and agreement ?Time-out conducted.   ?Noted  no overlying erythema, induration, or other signs of local infection.   ?Skin prepped in a sterile fashion.   ?Local anesthesia: Topical Ethyl chloride.   ?With sterile technique and under real time ultrasound guidance: 40 mg of Kenalog and 2 mL of Marcaine injected into subacromial bursa. Fluid seen entering the bursa.   ?Completed without difficulty   ?Pain immediately resolved suggesting accurate placement of the medication.   ?Advised to call if fevers/chills, erythema, induration, drainage, or persistent bleeding.   ?Images permanently stored and available for review in the ultrasound unit.  ?Impression: Technically successful ultrasound guided injection. ? ? ?X-ray images right shoulder obtained today personally and independently interpreted. ?High riding humeral head. ?AC DJD. ?No acute fractures are visible. ?.  Await formal radiology review ? ? ? ?Impression and Recommendations:   ? ?Assessment and Plan: ?66 y.o. female with chronic right shoulder pain.  Pain thought to be due to impingement and subacromial bursitis.  Plan for subacromial injection and physical therapy at her nursing home.  She is not a good surgical candidate at this time as she needs her right arm to transfer.  Recheck back as needed.. ? ?PDMP not reviewed this encounter. ?Orders Placed This Encounter  ?Procedures  ? [place] LIMITED JOINT SPACE STRUCTURES UP RIGHT(NO LINKED CHARGES)  ?  Order Specific Question:   Reason for Exam (SYMPTOM  OR DIAGNOSIS REQUIRED)  ?  Answer:   R shoulder pain  ?  Order Specific Question:   Preferred imaging location?  ?  Answer:   [facility]  ? DG Shoulder Right  ?  Standing Status:   Future  ?  Number  of Occurrences:   1  ?  Standing Expiration Date:   09/07/2021  ?  Order Specific Question:   Reason for Exam (SYMPTOM  OR DIAGNOSIS REQUIRED)  ?  Answer:   R shoulder pain  ?  Order Specific Question:   Preferred imaging location?  ?  Answer:   [facility]  ? ?No orders of the  defined types were placed in this encounter. ? ? ?Discussed warning signs or symptoms. Please see discharge instructions. Patient expresses understanding. ? ? ?The above documentation has been reviewed and is accurate and complete Lynne Leader, M.D. ? ?

## 2021-08-08 ENCOUNTER — Ambulatory Visit (INDEPENDENT_AMBULATORY_CARE_PROVIDER_SITE_OTHER): Payer: Medicare Other | Admitting: Family Medicine

## 2021-08-08 ENCOUNTER — Ambulatory Visit: Payer: Medicare Other

## 2021-08-08 ENCOUNTER — Ambulatory Visit (INDEPENDENT_AMBULATORY_CARE_PROVIDER_SITE_OTHER): Payer: Medicare Other

## 2021-08-08 ENCOUNTER — Other Ambulatory Visit: Payer: Self-pay

## 2021-08-08 VITALS — BP 134/86 | HR 91 | Ht 72.0 in

## 2021-08-08 DIAGNOSIS — M25511 Pain in right shoulder: Secondary | ICD-10-CM

## 2021-08-08 NOTE — Patient Instructions (Addendum)
Thank you for coming in today.  ? ?You received a steroid injection in your right shoulder today. Seek immediate medical attention if the joint becomes red, extremely painful, or is oozing fluid.  ? ?Continue physical therapy adding on your right shoulder to the treatment. ? ?Recheck back as needed ?

## 2021-08-12 NOTE — Progress Notes (Signed)
Right shoulder x-ray shows mild arthritis changes.

## 2022-02-13 ENCOUNTER — Other Ambulatory Visit: Payer: Self-pay | Admitting: Obstetrics and Gynecology

## 2022-02-13 DIAGNOSIS — N6452 Nipple discharge: Secondary | ICD-10-CM

## 2022-03-12 ENCOUNTER — Ambulatory Visit: Payer: Medicare Other

## 2022-03-12 ENCOUNTER — Ambulatory Visit
Admission: RE | Admit: 2022-03-12 | Discharge: 2022-03-12 | Disposition: A | Discharge status: Home / Self Care | Payer: Medicare Other | Source: Ambulatory Visit | Attending: Obstetrics and Gynecology | Admitting: Obstetrics and Gynecology

## 2022-03-12 DIAGNOSIS — N6452 Nipple discharge: Secondary | ICD-10-CM

## 2022-04-21 ENCOUNTER — Telehealth: Payer: Self-pay | Admitting: Internal Medicine

## 2022-04-21 NOTE — Telephone Encounter (Signed)
No answer when calling to schedule AWV with nha

## 2023-04-28 IMAGING — CR DG KNEE COMPLETE 4+V*L*
4 series · 4 of 4 positions shown · non-contrast
Comparison: None.

CLINICAL DATA: Pain

EXAM:
LEFT KNEE - COMPLETE 4+ VIEW

[knee ap]
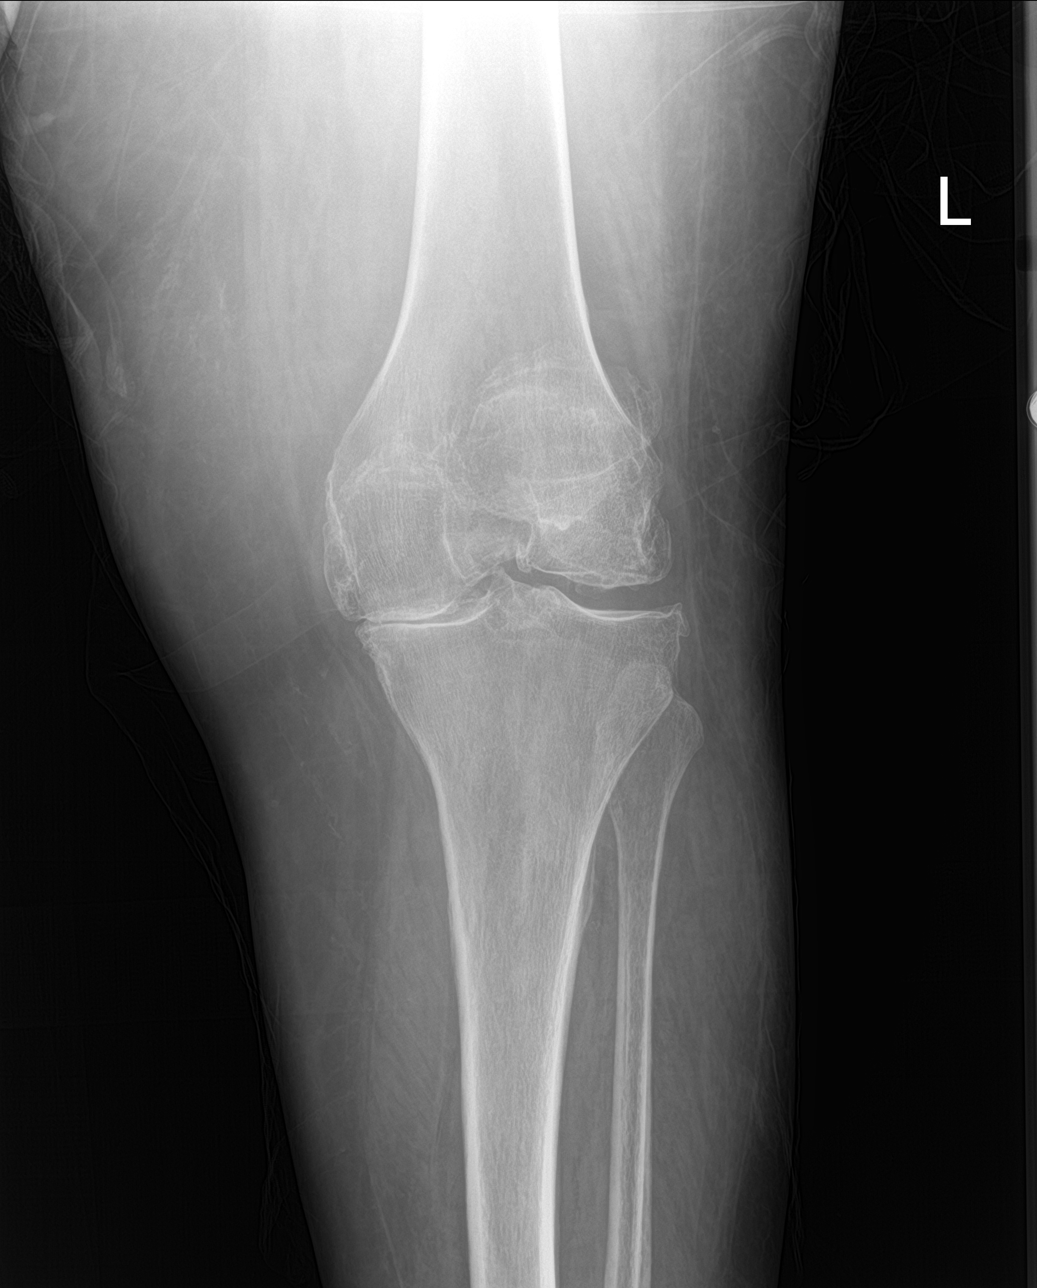

[knee lat]
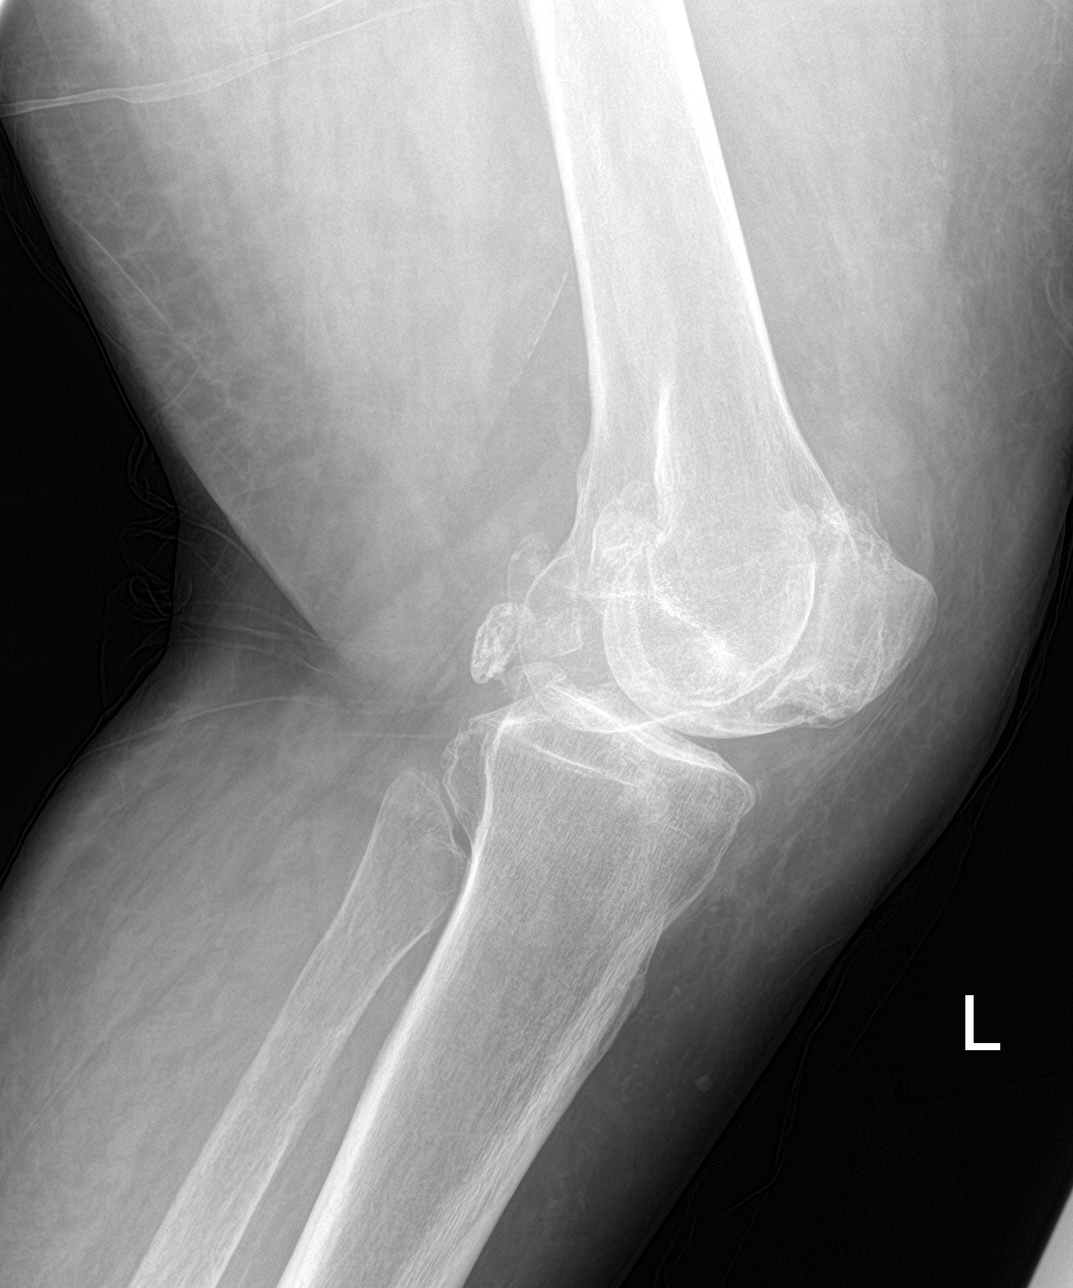

[knee obl (1 of 2)]
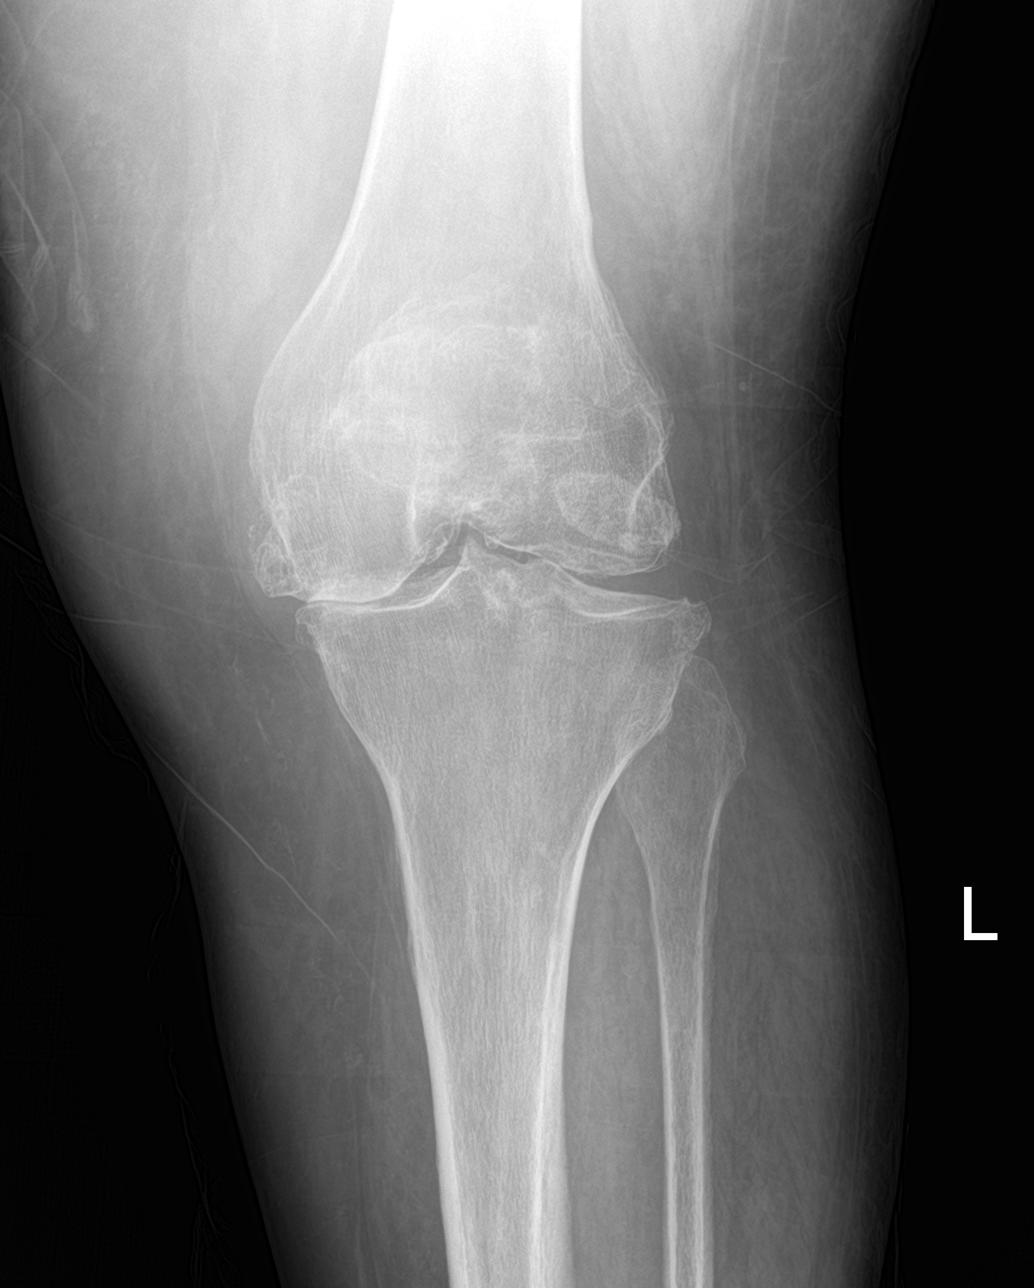

[knee obl (2 of 2)]
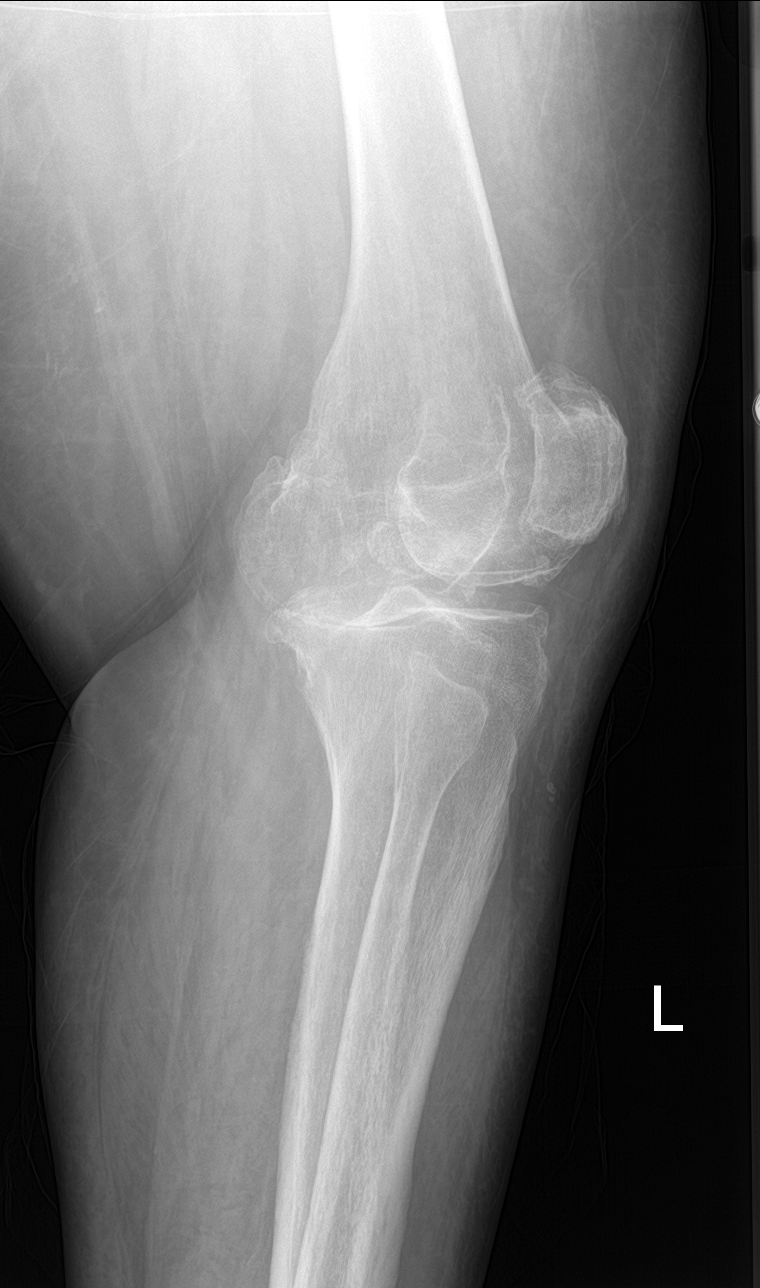

[4 of 4 positions shown; findings below may reference images not displayed]

FINDINGS: Advanced tricompartment degenerative changes most pronounced in the
medial and patellofemoral compartments. No joint effusion. No acute
bony abnormality. Specifically, no fracture, subluxation, or
dislocation.
IMPRESSION: Advanced tricompartment degenerative changes. No acute bony
abnormality.

## 2023-08-10 NOTE — Progress Notes (Unsigned)
 Rubin Payor, PhD, LAT, ATC acting as a scribe for Pamela Graham, MD.  Pamela [name] is a 68 y.o. female who presents to [facility] at [facility] today for R shoulder pain. Pt was last seen for this issue by Dr. Denyse Amass on 08/08/21 and was given a R subacromial steroid injection.  Today, pt reports R shoulder pain returned intermittently over the 6 months, worsening over the past month. Pain esp w/ shoulder aBd, IR, and flexion. She is RHD. She is currently a resident at [facility].   Dx imaging: 08/08/21 R shoulder XR  Pertinent review of systems: no fever or chills  Relevant historical information: HTN and DM hear failure.    Exam:  BP 118/78   Pulse 93   Ht 6' (1.829 m)   LMP 09/08/2010 (Exact Date)   SpO2 95%   BMI 43.26 kg/m  General: Well Developed, well nourished, and in no acute distress.   MSK: Right shoulder normal-appearing Range of motion intact abduction and external rotation.  Functional internal rotation limited lumbar spine. Strength intact abduction.  Pain with resisted abduction.  Positive Hawkins and Neer's test.    Lab and Radiology Results  Procedure: Real-time Ultrasound Guided Injection of right shoulder subacromial bursa Device: Philips Affiniti 50G/GE Logiq Images permanently stored and available for review in PACS Verbal informed consent obtained.  Discussed risks and benefits of procedure. Warned about infection, bleeding, hyperglycemia damage to structures among others. Patient expresses understanding and agreement Time-out conducted.   Noted no overlying erythema, induration, or other signs of local infection.   Skin prepped in a sterile fashion.   Local anesthesia: Topical Ethyl chloride.   With sterile technique and under real time ultrasound guidance: 40 mg of Kenalog and 2 mL of Marcaine injected into subacromial bursa. Fluid seen entering the bursa.   Completed without difficulty   Pain  immediately resolved suggesting accurate placement of the medication.   Advised to call if fevers/chills, erythema, induration, drainage, or persistent bleeding.   Images permanently stored and available for review in the ultrasound unit.  Impression: Technically successful ultrasound guided injection.    X-ray images right shoulder obtained today personally and independently interpreted. Mild GH DJD. No acute fracture.  Await formal radiology review     Assessment and Plan: 68 y.o. female with chronic right shoulder pain with acute exacerbation/recurrence today.  Pain due to impingement/bursitis.  It is possible that she does have a chronic rotator cuff tear but she is not a good surgical candidate given her poor health.  Plan for subacromial injection and PT at her skilled nursing facility.  Recheck in 6 weeks.   PDMP not reviewed this encounter. Orders Placed This Encounter  Procedures   [place] LIMITED JOINT SPACE STRUCTURES UP RIGHT(NO LINKED CHARGES)    Reason for Exam (SYMPTOM  OR DIAGNOSIS REQUIRED):   right shoulder pain    Preferred imaging location?:   [facility] Shoulder Right    Standing Status:   Future    Expiration Date:   08/10/2024    Reason for Exam (SYMPTOM  OR DIAGNOSIS REQUIRED):   right shoulder pain    Preferred imaging location?:   [facility]   Ambulatory referral to Physical Therapy    Referral Priority:   Routine    Referral Type:   Physical Medicine    Referral Reason:   Specialty Services Required    Requested Specialty:  Physical Therapy    Number of Visits Requested:   1   No orders of the defined types were placed in this encounter.    Discussed warning signs or symptoms. Please see discharge instructions. Patient expresses understanding.   The above documentation has been reviewed and is accurate and complete Pamela [name], M.D.

## 2023-08-11 ENCOUNTER — Other Ambulatory Visit: Payer: Self-pay

## 2023-08-11 ENCOUNTER — Ambulatory Visit: Payer: Medicare Other | Admitting: Family Medicine

## 2023-08-11 ENCOUNTER — Ambulatory Visit (INDEPENDENT_AMBULATORY_CARE_PROVIDER_SITE_OTHER)

## 2023-08-11 VITALS — BP 118/78 | HR 93 | Ht 72.0 in

## 2023-08-11 DIAGNOSIS — M25511 Pain in right shoulder: Secondary | ICD-10-CM

## 2023-08-11 DIAGNOSIS — G8929 Other chronic pain: Secondary | ICD-10-CM | POA: Diagnosis not present

## 2023-08-11 NOTE — Patient Instructions (Addendum)
 Thank you for coming in today.  X-ray today. Injection today in shoulder. Referral to PT. Return in 6 weeks.

## 2023-08-31 ENCOUNTER — Encounter: Payer: Self-pay | Admitting: Family Medicine

## 2023-08-31 NOTE — Progress Notes (Signed)
 Right shoulder looks about the same as it did in 2023.  A tiny little bit of arthritis is present.

## 2023-09-16 NOTE — Progress Notes (Deleted)
   Rubin Payor, PhD, LAT, ATC acting as a scribe for [name], MD.  Pamela [name] is a 68 y.o. female who presents to [facility] at [facility] today for f/u R shoulder pain. Pt was last seen by Dr. Denyse Amass on 08/11/23 and was given a R subacromial steroid injection and was referred to PT at her skilled nursing facility.  Today, pt reports ***  Dx imaging: 08/11/23 R shoulder XR 08/08/21 R shoulder XR  Pertinent review of systems: ***  Relevant historical information: ***   Exam:  LMP 09/08/2010 (Exact Date)  General: Well Developed, well nourished, and in no acute distress.   MSK: ***    Lab and Radiology Results No results found for this or any previous visit (from the past 72 hours). No results found.     Assessment and Plan: 68 y.o. female with ***   PDMP not reviewed this encounter. No orders of the defined types were placed in this encounter.  No orders of the defined types were placed in this encounter.    Discussed warning signs or symptoms. Please see discharge instructions. Patient expresses understanding.   ***

## 2023-09-18 ENCOUNTER — Ambulatory Visit: Admitting: Family Medicine

## 2023-10-13 ENCOUNTER — Encounter: Payer: Self-pay | Admitting: Family Medicine
